# Patient Record
Sex: Female | Born: 1950
Health system: Southern US, Community
[De-identification: ages and names within clinical notes are randomized; demographics above are authoritative.]

## PROBLEM LIST (undated history)

## (undated) DIAGNOSIS — C50919 Malignant neoplasm of unspecified site of unspecified female breast: Secondary | ICD-10-CM

## (undated) DIAGNOSIS — E079 Disorder of thyroid, unspecified: Secondary | ICD-10-CM

## (undated) DIAGNOSIS — K219 Gastro-esophageal reflux disease without esophagitis: Secondary | ICD-10-CM

## (undated) DIAGNOSIS — E785 Hyperlipidemia, unspecified: Secondary | ICD-10-CM

## (undated) DIAGNOSIS — M329 Systemic lupus erythematosus, unspecified: Secondary | ICD-10-CM

## (undated) DIAGNOSIS — E119 Type 2 diabetes mellitus without complications: Secondary | ICD-10-CM

## (undated) DIAGNOSIS — Z9889 Other specified postprocedural states: Secondary | ICD-10-CM

## (undated) DIAGNOSIS — E039 Hypothyroidism, unspecified: Secondary | ICD-10-CM

## (undated) DIAGNOSIS — R011 Cardiac murmur, unspecified: Secondary | ICD-10-CM

## (undated) DIAGNOSIS — I10 Essential (primary) hypertension: Secondary | ICD-10-CM

## (undated) DIAGNOSIS — R112 Nausea with vomiting, unspecified: Secondary | ICD-10-CM

## (undated) DIAGNOSIS — Z95828 Presence of other vascular implants and grafts: Secondary | ICD-10-CM

## (undated) DIAGNOSIS — IMO0002 Reserved for concepts with insufficient information to code with codable children: Secondary | ICD-10-CM

## (undated) HISTORY — DX: Hyperlipidemia, unspecified: E78.5

## (undated) HISTORY — PX: ABDOMINAL HYSTERECTOMY: SHX81

## (undated) HISTORY — PX: TONSILLECTOMY: SUR1361

## (undated) HISTORY — DX: Type 2 diabetes mellitus without complications: E11.9

## (undated) HISTORY — DX: Essential (primary) hypertension: I10

## (undated) HISTORY — DX: Reserved for concepts with insufficient information to code with codable children: IMO0002

## (undated) HISTORY — DX: Presence of other vascular implants and grafts: Z95.828

## (undated) HISTORY — DX: Disorder of thyroid, unspecified: E07.9

## (undated) HISTORY — DX: Systemic lupus erythematosus, unspecified: M32.9

## (undated) HISTORY — PX: OOPHORECTOMY: SHX86

---

## 2000-12-19 HISTORY — PX: BREAST BIOPSY: SHX20

## 2004-12-06 ENCOUNTER — Ambulatory Visit: Payer: Self-pay

## 2004-12-19 HISTORY — PX: GUM SURGERY: SHX658

## 2005-06-15 ENCOUNTER — Ambulatory Visit: Payer: Self-pay

## 2006-08-03 ENCOUNTER — Ambulatory Visit: Payer: Self-pay | Admitting: Family Medicine

## 2007-08-15 ENCOUNTER — Ambulatory Visit: Payer: Self-pay

## 2007-09-05 LAB — HM DEXA SCAN

## 2008-09-18 ENCOUNTER — Ambulatory Visit: Payer: Self-pay | Admitting: Family Medicine

## 2009-09-21 ENCOUNTER — Ambulatory Visit: Payer: Self-pay | Admitting: Family Medicine

## 2009-09-24 ENCOUNTER — Ambulatory Visit: Payer: Self-pay

## 2010-10-27 ENCOUNTER — Ambulatory Visit: Payer: Self-pay

## 2011-12-22 LAB — HM PAP SMEAR: HM PAP: NEGATIVE

## 2011-12-28 ENCOUNTER — Ambulatory Visit: Payer: Self-pay | Admitting: Family Medicine

## 2013-03-05 ENCOUNTER — Ambulatory Visit: Payer: Self-pay | Admitting: Family Medicine

## 2013-11-11 ENCOUNTER — Ambulatory Visit: Payer: Self-pay | Admitting: Family Medicine

## 2014-04-08 ENCOUNTER — Ambulatory Visit: Payer: Self-pay | Admitting: Family Medicine

## 2014-04-08 LAB — HM MAMMOGRAPHY

## 2014-12-19 DIAGNOSIS — R011 Cardiac murmur, unspecified: Secondary | ICD-10-CM

## 2014-12-19 HISTORY — DX: Cardiac murmur, unspecified: R01.1

## 2014-12-19 HISTORY — PX: MASTECTOMY: SHX3

## 2015-04-28 LAB — HEMOGLOBIN A1C: Hgb A1c MFr Bld: 6.2 % — AB (ref 4.0–6.0)

## 2015-04-28 LAB — BASIC METABOLIC PANEL
BUN: 11 mg/dL (ref 4–21)
Creatinine: 0.6 mg/dL (ref 0.5–1.1)
Glucose: 92 mg/dL
Potassium: 3.9 mmol/L (ref 3.4–5.3)
Sodium: 136 mmol/L — AB (ref 137–147)

## 2015-04-28 LAB — TSH: TSH: 1.28 u[IU]/mL (ref 0.41–5.90)

## 2015-04-28 LAB — HEPATIC FUNCTION PANEL
ALT: 33 U/L (ref 7–35)
AST: 27 U/L (ref 13–35)

## 2015-04-28 LAB — CBC AND DIFFERENTIAL
HEMATOCRIT: 40 % (ref 36–46)
HEMOGLOBIN: 14.7 g/dL (ref 12.0–16.0)
PLATELETS: 260 10*3/uL (ref 150–399)
WBC: 5.9 10^3/mL

## 2015-04-28 LAB — LIPID PANEL
Cholesterol: 161 mg/dL (ref 0–200)
HDL: 37 mg/dL (ref 35–70)
LDL CALC: 72 mg/dL
TRIGLYCERIDES: 258 mg/dL — AB (ref 40–160)

## 2015-05-06 ENCOUNTER — Other Ambulatory Visit: Payer: Self-pay | Admitting: Family Medicine

## 2015-05-06 DIAGNOSIS — Z1239 Encounter for other screening for malignant neoplasm of breast: Secondary | ICD-10-CM

## 2015-05-20 ENCOUNTER — Ambulatory Visit
Admission: RE | Admit: 2015-05-20 | Discharge: 2015-05-20 | Disposition: A | Discharge status: Home / Self Care | Payer: BLUE CROSS/BLUE SHIELD | Source: Ambulatory Visit | Attending: Family Medicine | Admitting: Family Medicine

## 2015-05-20 DIAGNOSIS — Z1231 Encounter for screening mammogram for malignant neoplasm of breast: Secondary | ICD-10-CM | POA: Diagnosis present

## 2015-05-20 DIAGNOSIS — R928 Other abnormal and inconclusive findings on diagnostic imaging of breast: Secondary | ICD-10-CM | POA: Insufficient documentation

## 2015-05-20 DIAGNOSIS — Z1239 Encounter for other screening for malignant neoplasm of breast: Secondary | ICD-10-CM

## 2015-05-25 ENCOUNTER — Other Ambulatory Visit: Payer: Self-pay | Admitting: Family Medicine

## 2015-05-25 DIAGNOSIS — R921 Mammographic calcification found on diagnostic imaging of breast: Secondary | ICD-10-CM

## 2015-05-25 DIAGNOSIS — R928 Other abnormal and inconclusive findings on diagnostic imaging of breast: Secondary | ICD-10-CM

## 2015-05-25 DIAGNOSIS — N6489 Other specified disorders of breast: Secondary | ICD-10-CM

## 2015-06-01 ENCOUNTER — Ambulatory Visit
Admission: RE | Admit: 2015-06-01 | Discharge: 2015-06-01 | Disposition: A | Discharge status: Home / Self Care | Payer: BLUE CROSS/BLUE SHIELD | Source: Ambulatory Visit | Attending: Family Medicine | Admitting: Family Medicine

## 2015-06-01 DIAGNOSIS — N63 Unspecified lump in breast: Secondary | ICD-10-CM | POA: Diagnosis not present

## 2015-06-01 DIAGNOSIS — N6489 Other specified disorders of breast: Secondary | ICD-10-CM

## 2015-06-01 DIAGNOSIS — R928 Other abnormal and inconclusive findings on diagnostic imaging of breast: Secondary | ICD-10-CM

## 2015-06-01 DIAGNOSIS — R921 Mammographic calcification found on diagnostic imaging of breast: Secondary | ICD-10-CM

## 2015-06-02 ENCOUNTER — Telehealth: Payer: Self-pay

## 2015-06-02 ENCOUNTER — Other Ambulatory Visit: Payer: Self-pay | Admitting: Family Medicine

## 2015-06-02 DIAGNOSIS — N63 Unspecified lump in unspecified breast: Secondary | ICD-10-CM

## 2015-06-02 DIAGNOSIS — R928 Other abnormal and inconclusive findings on diagnostic imaging of breast: Secondary | ICD-10-CM

## 2015-06-02 NOTE — Telephone Encounter (Signed)
Talked with patient. Wishes to proceed with biopsy thru radiology.  Spoke with Estill Bamberg and she will schedule.

## 2015-06-02 NOTE — Telephone Encounter (Signed)
Heather Burke with [facility] wants to know if you want them to set up her biopsy or are you sending her out to surgeon for biopsy?  Heather Burke call back #  Is [phone]

## 2015-06-08 ENCOUNTER — Ambulatory Visit
Admission: RE | Admit: 2015-06-08 | Discharge: 2015-06-08 | Disposition: A | Discharge status: Home / Self Care | Payer: BLUE CROSS/BLUE SHIELD | Source: Ambulatory Visit | Attending: Family Medicine | Admitting: Family Medicine

## 2015-06-08 ENCOUNTER — Other Ambulatory Visit: Payer: Self-pay | Admitting: Family Medicine

## 2015-06-08 DIAGNOSIS — R928 Other abnormal and inconclusive findings on diagnostic imaging of breast: Secondary | ICD-10-CM

## 2015-06-08 DIAGNOSIS — N63 Unspecified lump in unspecified breast: Secondary | ICD-10-CM

## 2015-06-08 DIAGNOSIS — C50919 Malignant neoplasm of unspecified site of unspecified female breast: Secondary | ICD-10-CM

## 2015-06-08 DIAGNOSIS — R921 Mammographic calcification found on diagnostic imaging of breast: Secondary | ICD-10-CM | POA: Diagnosis present

## 2015-06-08 DIAGNOSIS — N6489 Other specified disorders of breast: Secondary | ICD-10-CM | POA: Insufficient documentation

## 2015-06-08 HISTORY — DX: Malignant neoplasm of unspecified site of unspecified female breast: C50.919

## 2015-06-08 HISTORY — PX: BREAST BIOPSY: SHX20

## 2015-06-08 HISTORY — PX: OTHER SURGICAL HISTORY: SHX169

## 2015-06-09 ENCOUNTER — Telehealth: Payer: Self-pay

## 2015-06-09 DIAGNOSIS — C50912 Malignant neoplasm of unspecified site of left female breast: Secondary | ICD-10-CM

## 2015-06-09 NOTE — Telephone Encounter (Signed)
Dr. Luana Shu from [facility] called with pathology report for Heather Burke.  Part A left breast at 2 o'clock biopsy- invasive mammary carcinoma, DCIS Part B left breast at 11:30 -invasive lobular carcinoma, LCIS ER, PR and HER #2 ARE PENDING.

## 2015-06-11 ENCOUNTER — Encounter: Payer: Self-pay | Admitting: Family Medicine

## 2015-06-11 ENCOUNTER — Other Ambulatory Visit: Payer: Self-pay | Admitting: Family Medicine

## 2015-06-11 DIAGNOSIS — C50919 Malignant neoplasm of unspecified site of unspecified female breast: Secondary | ICD-10-CM | POA: Insufficient documentation

## 2015-06-11 NOTE — Telephone Encounter (Signed)
Patient advised of breast cancer. Please refer to Dr. Tollie Pizza who she saw in 2002. Please do this ASAP as she is very anxious about this. Thank you

## 2015-06-12 NOTE — Telephone Encounter (Signed)
Please refer Dr. Bary Castilla for recurrent of breast cancer.Thanks.

## 2015-06-15 ENCOUNTER — Other Ambulatory Visit: Payer: Self-pay | Admitting: Family Medicine

## 2015-06-15 ENCOUNTER — Encounter: Payer: Self-pay | Admitting: *Deleted

## 2015-06-15 NOTE — Progress Notes (Signed)
Patient newly diagnosed with breast cancer.  Called to establish navigation services.  States she has appointment with Dr. Bary Castilla tomorrow.  Instructed I would leave patient breast cancer educational literature, "My Breast Cancer Treatment Handbook" by Josephine Igo, RN, at Dr. Dwyane Luo office.  She is to call if she has any questions or needs.

## 2015-06-16 ENCOUNTER — Ambulatory Visit (INDEPENDENT_AMBULATORY_CARE_PROVIDER_SITE_OTHER): Payer: BLUE CROSS/BLUE SHIELD | Admitting: General Surgery

## 2015-06-16 ENCOUNTER — Encounter: Payer: Self-pay | Admitting: General Surgery

## 2015-06-16 VITALS — BP 132/82 | HR 78 | Resp 18 | Ht 63.0 in | Wt 172.0 lb

## 2015-06-16 DIAGNOSIS — C50912 Malignant neoplasm of unspecified site of left female breast: Secondary | ICD-10-CM | POA: Diagnosis not present

## 2015-06-16 NOTE — Progress Notes (Signed)
Patient ID: Heather Burke, female   DOB: July 08, 1951, 64 y.o.   MRN: [id number]  Chief Complaint  Patient presents with  . Other    breast cancer    HPI Heather Burke is a 64 y.o. female who presents for a breast evaluation. The most recent mammogram and ultrasound and left core biopsy was done on 06/08/15 showing invasive breast cancer. She states that they found 3 spots and biopsied 2 of them.  Patient does perform regular self breast checks and gets regular mammograms done.  She states she had not felt anything different in the breast.  She is here today with her 2 daughters, Heather Burke and Heather Burke as well as her sister, Heather Burke.  Denies family history of breast or colon cancer.  She has not had a colonoscopy. This was on her "to do list" before her recent breast cancer diagnosis.  HPI  Past Medical History  Diagnosis Date  . Hypertension   . Hyperlipidemia   . Thyroid disease   . Cancer 06-08-15    invasive breast    Past Surgical History  Procedure Laterality Date  . Breast biopsy Right 2002    negative/ Dr. Bary Castilla  . Core biopsy Left 06/08/15  . Abdominal hysterectomy    . Tonsillectomy      Family History  Problem Relation Age of Onset  . Cancer Father     prostate    Social History History  Substance Use Topics  . Smoking status: Current Every Day Smoker -- 2.00 packs/day for 44 years    Types: Cigarettes  . Smokeless tobacco: Never Used  . Alcohol Use: 0.0 oz/week    0 Standard drinks or equivalent per week     Comment: 3-4/day    No Known Allergies  Current Outpatient Prescriptions  Medication Sig Dispense Refill  . aspirin 81 MG tablet Take 81 mg by mouth daily.    . calcium carbonate (OS-CAL) 600 MG TABS tablet Take 600 mg by mouth 2 (two) times daily with a meal.    . Cholecalciferol (D3 ADULT PO) Take by mouth.    . FIBER PO Take by mouth.    . fluticasone (FLONASE) 50 MCG/ACT nasal spray   5  . losartan-hydrochlorothiazide (HYZAAR) 100-25 MG  per tablet TAKE 1 TABLET BY MOUTH EVERY DAY 30 tablet 4  . lovastatin (MEVACOR) 20 MG tablet Take 20 mg by mouth at bedtime.    . Omega-3 Fatty Acids (FISH OIL) 1200 MG CAPS Take by mouth daily.    Marland Kitchen SYNTHROID 88 MCG tablet TAKE 1 TABLET EVERY DAY --- (BRAND NAME ONLY)  6   No current facility-administered medications for this visit.    Review of Systems Review of Systems  Constitutional: Negative.   Respiratory: Negative.   Cardiovascular: Negative.   All other systems reviewed and are negative.   Blood pressure 132/82, pulse 78, resp. rate 18, height $RemoveBe'5\' 3"'jUjccOAlF$  (1.6 m), weight 172 lb (78.019 kg).  Physical Exam Physical Exam  Constitutional: She is oriented to person, place, and time. She appears well-developed and well-nourished.  HENT:  Mouth/Throat: Oropharynx is clear and moist.  Eyes: Conjunctivae are normal. No scleral icterus.  Neck: Neck supple.  Cardiovascular: Normal rate, regular rhythm and normal heart sounds.   Pulmonary/Chest: Effort normal and breath sounds normal. Right breast exhibits mass. Right breast exhibits no inverted nipple, no nipple discharge, no skin change and no tenderness. Left breast exhibits skin change. Left breast exhibits no inverted nipple, no mass, no  nipple discharge and no tenderness.    Left breast bruising upper inner quadrant and 2 o'clock position. Thickening at 10 o'clock right breast and 1 cm nodule at site of previous biopsy at 10 oclockl 9 CFN.  Lymphadenopathy:    She has no cervical adenopathy.    She has no axillary adenopathy.  Neurological: She is alert and oriented to person, place, and time.  Skin: Skin is warm and dry.  Psychiatric: She has a normal mood and affect.    Data Reviewed [facility] mammograms dated 05/20/2015 showed a possible asymmetry and calcification 14 further studies. The right breast was unremarkable. BI-RADS-0.  Left breast diagnostic mammogram and ultrasound dated 06/01/2015 showed a spiculated mass with  calcifications in the upper inner quadrant of the left breast, 2 smaller spiculated masses in the upper outer quadrant left breast at the 1 2:00 positions. Ultrasound showed a 9 mm area in the 11:30 o'clock position of the left breast 5 cm the nipple suspicious for cancer. A similar focus in the 1:00 position also 9 mm in sign is also noted. Irregular area in the 2:00 position measures 8 x 8 x 7 mm and is also suspicious. The distance from the 11:30 o'clock to the 2:00 lesion is reported 7.6 cm. An area of microcalcifications in the retroareolar area of the breast had been highlighted on her previous mammograms was not further, and upon. By red-5.  Patient subsequently underwent ultrasound-guided core biopsy of 2 lesions at 11:30 and 12:00 position. The 11:00 lesion showed invasive lobular carcinoma of a classic type as well as LCIS. The 2:00 lesion showed invasive mammary carcinoma with low-grade DCIS.  ER and PR receptors were positive for both lesions. HER-2/neu was reported as equivocal for both. No report of reflex testing to fish.  Postbiopsy imaging dated 620, 2018 was reviewed.  Assessment    At a minimum 2 foci of malignancy involving the left breast. Volume may make breast conservation difficult. The microcalcifications in the retroareolar area have yet to be addressed and should breast conservation be considered stereotactic biopsy of this area would be mandatory.    Plan    The majority of our hour-long visit was present reviewing options for management. The first order of business is or the patient to determine how important it is to her to preserve her own breast. Breast conservation and mastectomy were presented as equivalent in the case of a unifocal malignancy. With at least 2 foci and possibly another encompassing the breast, we'll need to have a really strong commitment to breast conservation rather than mastectomy with or without immediate reconstruction.  The option for plastic  surgery evaluation prior to surgical therapy was encouraged.  The risks associated with surgery, recovery time and return to activities was discussed.  With the findings of invasive lobular carcinoma strong consideration will be given to preoperative MRI as this contorted asleep be overlooked on mammography in the right breast.  The patient and her daughters had all of their questions answered. She'll consider her options and notify the office of how she would like to proceed. He was emphasized to her that this is the time to get the information that she needs to make a good decision.     PCP: Etheleen Mayhew 06/17/2015, 7:11 PM

## 2015-06-16 NOTE — Patient Instructions (Signed)
The patient is aware to call back for any questions or concerns.  

## 2015-06-17 DIAGNOSIS — Z853 Personal history of malignant neoplasm of breast: Secondary | ICD-10-CM | POA: Insufficient documentation

## 2015-06-17 DIAGNOSIS — C50919 Malignant neoplasm of unspecified site of unspecified female breast: Secondary | ICD-10-CM | POA: Insufficient documentation

## 2015-06-18 ENCOUNTER — Telehealth: Payer: Self-pay | Admitting: *Deleted

## 2015-06-18 NOTE — Telephone Encounter (Signed)
Talked to patient today.  States she met with Dr. Bary Castilla and is now deciding on which type of surgery she wants.  States she did get her [employer] manager.  Offered support.  Ok to follow-up with her in about a week.

## 2015-06-23 ENCOUNTER — Telehealth: Payer: Self-pay

## 2015-06-23 NOTE — Telephone Encounter (Signed)
The patient called about surgery. She states that she would like to precede with having the breast lumpectomy done.

## 2015-06-23 NOTE — Telephone Encounter (Signed)
He patient has expressed an interest in breast conservation. With the finding of invasive lobular carcinoma, I have encouraged her to have a breast MRI completed prior to surgical intervention. This may also help clear the area of microcalcifications in the retroareolar area, but biopsy may still be necessary after reviewing the films with radiology service.  The patient reports she does have some tendency to claustrophobia, and for that reason we'll arrange for her to pick up a prescription for Valium, 10 mg to be taken one hour prior to the procedure. She is aware that she will need to have a driver to the [place] office for this imaging study to be completed.  We'll start looking for a date for surgical intervention after we know when the MRI is scheduled.

## 2015-06-24 ENCOUNTER — Other Ambulatory Visit: Payer: Self-pay

## 2015-06-24 DIAGNOSIS — C50912 Malignant neoplasm of unspecified site of left female breast: Secondary | ICD-10-CM

## 2015-06-24 NOTE — Progress Notes (Signed)
Spoke with patient about scheduling her breast MRI. She is aware that the [facility] will be calling her in the next 2 days to schedule this. I have asked her to call us once this is scheduled and we will have her pick up her prescription for Valium here at the office.

## 2015-06-26 NOTE — Telephone Encounter (Signed)
Patient called and her MRI is scheduled for 07/03/15 @ 9:30  [facility], Patient to pick up prescription on Monday 06/29/15.

## 2015-06-29 LAB — SURGICAL PATHOLOGY

## 2015-07-01 ENCOUNTER — Other Ambulatory Visit: Payer: Self-pay | Admitting: Family Medicine

## 2015-07-01 DIAGNOSIS — E1159 Type 2 diabetes mellitus with other circulatory complications: Secondary | ICD-10-CM | POA: Insufficient documentation

## 2015-07-01 DIAGNOSIS — I152 Hypertension secondary to endocrine disorders: Secondary | ICD-10-CM | POA: Insufficient documentation

## 2015-07-01 DIAGNOSIS — I10 Essential (primary) hypertension: Secondary | ICD-10-CM

## 2015-07-01 NOTE — Telephone Encounter (Signed)
This came to Dr. Rosanna Randy for Cozaar refill to [facility].  This is a Dr. Venia Minks patient. Thanks  [employer]

## 2015-07-03 ENCOUNTER — Ambulatory Visit
Admission: RE | Admit: 2015-07-03 | Discharge: 2015-07-03 | Disposition: A | Discharge status: Home / Self Care | Payer: BLUE CROSS/BLUE SHIELD | Source: Ambulatory Visit | Attending: General Surgery | Admitting: General Surgery

## 2015-07-03 DIAGNOSIS — C50912 Malignant neoplasm of unspecified site of left female breast: Secondary | ICD-10-CM

## 2015-07-03 MED ORDER — GADOBENATE DIMEGLUMINE 529 MG/ML IV SOLN
16.0000 mL | Freq: Once | INTRAVENOUS | Status: AC | PRN
  Administered 2015-07-03: 16 mL via INTRAVENOUS

## 2015-07-04 ENCOUNTER — Other Ambulatory Visit: Payer: Self-pay | Admitting: Family Medicine

## 2015-07-07 ENCOUNTER — Telehealth: Payer: Self-pay | Admitting: General Surgery

## 2015-07-07 ENCOUNTER — Other Ambulatory Visit: Payer: Self-pay

## 2015-07-07 DIAGNOSIS — C50912 Malignant neoplasm of unspecified site of left female breast: Secondary | ICD-10-CM

## 2015-07-07 NOTE — Telephone Encounter (Signed)
Results of recently completed bilateral breast MRI from 07/03/2015 was reviewed with the patient. 3 known areas of suspected cancer as well as an additional satellite lesion adjacent. Area of microcalcification shows no increased enhancement, although it recent [facility] tumor board presentation biopsy of this area was recommended if breast conservation was planned. The contralateral breast is normal.  The patient is leaning towards mastectomy, which I would agree with in light of 3 separate primaries and the wide area involved. She would like to meet with plastic surgery, an appointment will be scheduled as soon as possible. She would be a candidate for skin sparing mastectomy.  We'll arrange for an office visit after her upcoming plastic surgery assessment.

## 2015-07-08 ENCOUNTER — Telehealth: Payer: Self-pay | Admitting: *Deleted

## 2015-07-08 NOTE — Telephone Encounter (Signed)
[place] at Dr. Edwin Dada office has been notified accordingly. Their office will be in contact with the patient to schedule an appointment.

## 2015-07-08 NOTE — Telephone Encounter (Signed)
Patient called and stated that she cannot see the plastic surgeon the week of August 20 th.

## 2015-07-17 ENCOUNTER — Telehealth: Payer: Self-pay

## 2015-07-17 NOTE — Telephone Encounter (Signed)
I spoke with the patient about her decision to proceed to mastectomy. At this time, after conversation with the office staff at Dr. Edwin Dada, she is decided to put plastic surgery consultation on the back burner.  With 3 foci of malignancy in the breast I think mastectomy is a good choice.  We will shoot for the end of next month as she would like with a preop visit to confirm no new questions have arisen.

## 2015-07-17 NOTE — Telephone Encounter (Signed)
Patient called and would like to scheduled her surgery. She said that she would like to go ahead with the Mastectomy. She will be following up with her plastic surgeon Dr Tula Nakayama once she is fully healed to discuss reconstruction. She would like to have her surgery done on 08/18/15. She is aware she will need to be seen here in the office for a pre op visit with Dr Bary Castilla. I let her know I would call her back with full instructions once her orders had been received.

## 2015-07-20 ENCOUNTER — Encounter: Payer: Self-pay | Admitting: General Surgery

## 2015-07-20 ENCOUNTER — Other Ambulatory Visit: Payer: Self-pay | Admitting: *Deleted

## 2015-07-20 DIAGNOSIS — C50912 Malignant neoplasm of unspecified site of left female breast: Secondary | ICD-10-CM

## 2015-08-06 ENCOUNTER — Encounter: Payer: Self-pay | Admitting: General Surgery

## 2015-08-06 ENCOUNTER — Ambulatory Visit (INDEPENDENT_AMBULATORY_CARE_PROVIDER_SITE_OTHER): Payer: BLUE CROSS/BLUE SHIELD | Admitting: General Surgery

## 2015-08-06 VITALS — BP 144/82 | HR 80 | Resp 12 | Ht 63.0 in | Wt 172.0 lb

## 2015-08-06 DIAGNOSIS — C50912 Malignant neoplasm of unspecified site of left female breast: Secondary | ICD-10-CM

## 2015-08-06 NOTE — Patient Instructions (Addendum)
The patient is aware to call back for any questions or concerns.  Patient's surgery has been scheduled for 08-18-15 at [facility]. It is okay for patient to continue 81 mg aspirin once daily.

## 2015-08-06 NOTE — Progress Notes (Signed)
Patient ID: Heather Burke, female   DOB: Jul 24, 1951, 64 y.o.   MRN: [id number]  Chief Complaint  Patient presents with  . Pre-op Exam    HPI Heather Burke is a 64 y.o. female.  Here today for her preop visit, left mastectomy on 08-18-15. She did have a breast MRI on 07-03-15.  HPI  Past Medical History  Diagnosis Date  . Hypertension   . Hyperlipidemia   . Thyroid disease   . Cancer 06-08-15    invasive breast, ER+,PR+, Her 2 neg (FISH neg) x2.    Past Surgical History  Procedure Laterality Date  . Breast biopsy Right 2002    negative/ Dr. Bary Castilla  . Core biopsy Left 06/08/15  . Abdominal hysterectomy    . Tonsillectomy    . Breast biopsy Left 06-08-15    INVASIVE MAMMARY CARCINOMA WITH PARTIAL SOLID PATTERN.     Family History  Problem Relation Age of Onset  . Cancer Father     prostate    Social History Social History  Substance Use Topics  . Smoking status: Current Every Day Smoker -- 2.00 packs/day for 44 years    Types: Cigarettes  . Smokeless tobacco: Never Used  . Alcohol Use: 0.0 oz/week    0 Standard drinks or equivalent per week     Comment: 3-4/day    No Known Allergies  Current Outpatient Prescriptions  Medication Sig Dispense Refill  . aspirin 81 MG tablet Take 81 mg by mouth daily.    . calcium carbonate (OS-CAL) 600 MG TABS tablet Take 600 mg by mouth 2 (two) times daily with a meal.    . Cholecalciferol (D3 ADULT PO) Take by mouth.    . FIBER PO Take by mouth.    . fluticasone (FLONASE) 50 MCG/ACT nasal spray   5  . losartan-hydrochlorothiazide (HYZAAR) 100-25 MG per tablet TAKE 1 TABLET BY MOUTH EVERY DAY 30 tablet 3  . lovastatin (MEVACOR) 20 MG tablet Take 20 mg by mouth at bedtime.    . Omega-3 Fatty Acids (FISH OIL) 1200 MG CAPS Take by mouth daily.    Marland Kitchen SYNTHROID 88 MCG tablet TAKE 1 TABLET EVERY DAY --- (BRAND NAME ONLY)  6   No current facility-administered medications for this visit.    Review of Systems Review of Systems   Constitutional: Negative.   Respiratory: Negative.   Cardiovascular: Negative.   Neurological: Positive for headaches.    Blood pressure 144/82, pulse 80, resp. rate 12, height 5\' 3"  (1.6 m), weight 172 lb (78.019 kg).  Physical Exam Physical Exam  Constitutional: She is oriented to person, place, and time. She appears well-developed and well-nourished.  HENT:  Mouth/Throat: Oropharynx is clear and moist.  Eyes: Conjunctivae are normal. No scleral icterus.  Neck: Neck supple.  Cardiovascular: Normal rate and regular rhythm.   Murmur heard.  Systolic murmur is present with a grade of 2/6  Pulmonary/Chest: Effort normal and breath sounds normal.  Left breast bruising.  Lymphadenopathy:    She has no cervical adenopathy.  Neurological: She is alert and oriented to person, place, and time.  Skin: Skin is warm.  Psychiatric: Her behavior is normal.    Data Reviewed IMPRESSION: 1. Biopsy-proven multicentric malignancy involving the left breast. The 3 adjacent masses identified on ultrasound are confirmed on MRI at the 11:30 o'clock position, 1 o'clock position and 2 o'clock position. 2. Possible 3 mm satellite nodule adjacent to the 2 o'clock mass, approximately 6 mm posterior to the mass.  Due to its close proximity to the dominant 2 o'clock mass, it would likely be removed at the time of resection. 3. No MRI evidence of malignancy involving the right breast. 4. Benign sebaceous cyst involving the outer right breast at the approximate 9 o'clock position. 5. No pathologic lymphadenopathy. 6. 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 On review of the films, this involves the right chest wall.   Assessment    Multifocal carcinoma the left breast.    Plan    The patient has decided to proceed with mastectomy. At this time, immediate reconstruction is not on the table. Patient's surgery has  been scheduled for 08-18-15 at [facility]. Postoperative care including drain management and compressive wrap reviewed. It is acceptable for patient to continue 81 mg aspirin once daily.      PCP:  Etheleen Mayhew 08/08/2015, 7:51 AM

## 2015-08-08 NOTE — H&P (Signed)
Patient ID: Heather Burke, female DOB: [birth year], 64 y.o. MRN: [id number]  Chief Complaint   Patient presents with   .  Pre-op Exam    HPI  Heather Burke is a 64 y.o. female. Here today for her preop visit, left mastectomy on 08-18-15. She did have a breast MRI on 07-03-15.  HPI  Past Medical History   Diagnosis  Date   .  Hypertension    .  Hyperlipidemia    .  Thyroid disease    .  Cancer  06-08-15     invasive breast, ER+,PR+, Her 2 neg (FISH neg) x2.    Past Surgical History   Procedure  Laterality  Date   .  Breast biopsy  Right  2002     negative/ Dr. Bary Castilla   .  Core biopsy  Left  06/08/15   .  Abdominal hysterectomy     .  Tonsillectomy     .  Breast biopsy  Left  06-08-15     INVASIVE MAMMARY CARCINOMA WITH PARTIAL SOLID PATTERN.    Family History   Problem  Relation  Age of Onset   .  Cancer  Father      prostate    Social History  Social History   Substance Use Topics   .  Smoking status:  Current Every Day Smoker -- 2.00 packs/day for 44 years     Types:  Cigarettes   .  Smokeless tobacco:  Never Used   .  Alcohol Use:  0.0 oz/week     0 Standard drinks or equivalent per week      Comment: 3-4/day    No Known Allergies  Current Outpatient Prescriptions   Medication  Sig  Dispense  Refill   .  aspirin 81 MG tablet  Take 81 mg by mouth daily.     .  calcium carbonate (OS-CAL) 600 MG TABS tablet  Take 600 mg by mouth 2 (two) times daily with a meal.     .  Cholecalciferol (D3 ADULT PO)  Take by mouth.     .  FIBER PO  Take by mouth.     .  fluticasone (FLONASE) 50 MCG/ACT nasal spray    5   .  losartan-hydrochlorothiazide (HYZAAR) 100-25 MG per tablet  TAKE 1 TABLET BY MOUTH EVERY DAY  30 tablet  3   .  lovastatin (MEVACOR) 20 MG tablet  Take 20 mg by mouth at bedtime.     .  Omega-3 Fatty Acids (FISH OIL) 1200 MG CAPS  Take by mouth daily.     Marland Kitchen  SYNTHROID 88 MCG tablet  TAKE 1 TABLET EVERY DAY --- (BRAND NAME ONLY)   6    No current  facility-administered medications for this visit.    Review of Systems  Review of Systems  Constitutional: Negative.  Respiratory: Negative.  Cardiovascular: Negative.  Neurological: Positive for headaches.   Blood pressure 144/82, pulse 80, resp. rate 12, height 5\' 3"  (1.6 m), weight 172 lb (78.019 kg).  Physical Exam  Physical Exam  Constitutional: She is oriented to person, place, and time. She appears well-developed and well-nourished.  HENT:  Mouth/Throat: Oropharynx is clear and moist.  Eyes: Conjunctivae are normal. No scleral icterus.  Neck: Neck supple.  Cardiovascular: Normal rate and regular rhythm.  Murmur heard.  Systolic murmur is present with a grade of 2/6  Pulmonary/Chest: Effort normal and breath sounds normal.  Left breast bruising.  Lymphadenopathy:  She  has no cervical adenopathy.  Neurological: She is alert and oriented to person, place, and time.  Skin: Skin is warm.  Psychiatric: Her behavior is normal.   Data Reviewed  IMPRESSION:  1. Biopsy-proven multicentric malignancy involving the left breast.  The 3 adjacent masses identified on ultrasound are confirmed on MRI  at the 11:30 o'clock position, 1 o'clock position and 2 o'clock  position.  2. Possible 3 mm satellite nodule adjacent to the 2 o'clock mass,  approximately 6 mm posterior to the mass. Due to its close proximity  to the dominant 2 o'clock mass, it would likely be removed at the  time of resection.  3. No MRI evidence of malignancy involving the right breast.  4. Benign sebaceous cyst involving the outer right breast at the  approximate 9 o'clock position.  5. No pathologic lymphadenopathy.  6. 8 mm aneurysm involving the proximal internal thoracic artery as  it courses between the pectoralis muscles in the chest wall. On review of the films, this involves the right chest wall.  Assessment   Multifocal carcinoma the left breast.   Plan   The patient has decided to proceed with  mastectomy. At this time, immediate reconstruction is not on the table. Patient's surgery has been scheduled for 08-18-15 at [facility]. Postoperative care including drain management and compressive wrap reviewed. It is acceptable for patient to continue 81 mg aspirin once daily.   PCP: Etheleen Mayhew  08/08/2015, 7:51 AM

## 2015-08-13 ENCOUNTER — Other Ambulatory Visit: Payer: BLUE CROSS/BLUE SHIELD

## 2015-08-13 ENCOUNTER — Encounter: Payer: Self-pay | Admitting: *Deleted

## 2015-08-13 DIAGNOSIS — C50912 Malignant neoplasm of unspecified site of left female breast: Secondary | ICD-10-CM | POA: Diagnosis present

## 2015-08-13 DIAGNOSIS — C50212 Malignant neoplasm of upper-inner quadrant of left female breast: Secondary | ICD-10-CM | POA: Diagnosis not present

## 2015-08-13 DIAGNOSIS — Z8042 Family history of malignant neoplasm of prostate: Secondary | ICD-10-CM | POA: Diagnosis not present

## 2015-08-13 DIAGNOSIS — Z170 Estrogen receptor positive status [ER+]: Secondary | ICD-10-CM | POA: Diagnosis not present

## 2015-08-13 DIAGNOSIS — D0512 Intraductal carcinoma in situ of left breast: Secondary | ICD-10-CM | POA: Diagnosis not present

## 2015-08-13 DIAGNOSIS — Z87891 Personal history of nicotine dependence: Secondary | ICD-10-CM | POA: Diagnosis not present

## 2015-08-13 DIAGNOSIS — I10 Essential (primary) hypertension: Secondary | ICD-10-CM | POA: Diagnosis not present

## 2015-08-13 DIAGNOSIS — E785 Hyperlipidemia, unspecified: Secondary | ICD-10-CM | POA: Diagnosis not present

## 2015-08-13 DIAGNOSIS — E039 Hypothyroidism, unspecified: Secondary | ICD-10-CM | POA: Diagnosis not present

## 2015-08-13 DIAGNOSIS — Z79899 Other long term (current) drug therapy: Secondary | ICD-10-CM | POA: Diagnosis not present

## 2015-08-13 DIAGNOSIS — Z7982 Long term (current) use of aspirin: Secondary | ICD-10-CM | POA: Diagnosis not present

## 2015-08-13 MED ORDER — BUPIVACAINE HCL (PF) 0.5 % IJ SOLN
INTRAMUSCULAR | Status: AC
  Filled 2015-08-13: qty 30

## 2015-08-13 NOTE — Patient Instructions (Addendum)
  Your procedure is scheduled on: 08-18-15 Report to [facility] (2ND DESK ON RIGHT) @ 7:45 AM   Remember: Instructions that are not followed completely may result in serious medical risk, up to and including death, or upon the discretion of your surgeon and anesthesiologist your surgery may need to be rescheduled.    _X___ 1. Do not eat food or drink liquids after midnight. No gum chewing or hard candies.     _X___ 2. No Alcohol for 24 hours before or after surgery.   ____ 3. Bring all medications with you on the day of surgery if instructed.    _X___ 4. Notify your doctor if there is any change in your medical condition     (cold, fever, infections).     Do not wear jewelry, make-up, hairpins, clips or nail polish.  Do not wear lotions, powders, or perfumes. You may wear deodorant.  Do not shave 48 hours prior to surgery. Men may shave face and neck.  Do not bring valuables to the hospital.    [facility] is not responsible for any belongings or valuables.               Contacts, dentures or bridgework may not be worn into surgery.  Leave your suitcase in the car. After surgery it may be brought to your room.  For patients admitted to the hospital, discharge time is determined by your treatment team.   Patients discharged the day of surgery will not be allowed to drive home.   Please read over the following fact sheets that you were given:     _X___ Take these medicines the morning of surgery with A SIP OF WATER:    1. SYNTHROID  2.   3.   4.  5.  6.  ____ Fleet Enema (as directed)   _X___ Use CHG Soap as directed  ____ Use inhalers on the day of surgery  ____ Stop metformin 2 days prior to surgery    ____ Take 1/2 of usual insulin dose the night before surgery and none on the morning of surgery.   ____ Stop Coumadin/Plavix/aspirin-N/A  ____ Stop Anti-inflammatories-NO NSAIDS OR ASPIRIN PRODUCTS-TYLENOL OK   ____ Stop supplements until after  surgery.    ____ Bring C-Pap to the hospital.

## 2015-08-17 ENCOUNTER — Other Ambulatory Visit: Payer: BLUE CROSS/BLUE SHIELD

## 2015-08-17 ENCOUNTER — Encounter
Admission: RE | Admit: 2015-08-17 | Discharge: 2015-08-17 | Disposition: A | Discharge status: Home / Self Care | Payer: BLUE CROSS/BLUE SHIELD | Source: Ambulatory Visit | Attending: Anesthesiology | Admitting: Anesthesiology

## 2015-08-17 DIAGNOSIS — D0512 Intraductal carcinoma in situ of left breast: Secondary | ICD-10-CM | POA: Diagnosis not present

## 2015-08-17 LAB — BASIC METABOLIC PANEL
ANION GAP: 10 (ref 5–15)
BUN: 11 mg/dL (ref 6–20)
CHLORIDE: 98 mmol/L — AB (ref 101–111)
CO2: 29 mmol/L (ref 22–32)
Calcium: 9.7 mg/dL (ref 8.9–10.3)
Creatinine, Ser: 1.18 mg/dL — ABNORMAL HIGH (ref 0.44–1.00)
GFR calc Af Amer: 56 mL/min — ABNORMAL LOW (ref >60)
GFR calc non Af Amer: 48 mL/min — ABNORMAL LOW (ref >60)
Glucose, Bld: 189 mg/dL — ABNORMAL HIGH (ref 65–99)
POTASSIUM: 3.4 mmol/L — AB (ref 3.5–5.1)
Sodium: 137 mmol/L (ref 135–145)

## 2015-08-18 ENCOUNTER — Encounter
Admission: RE | Disposition: A | Discharge status: Home / Self Care | Payer: Self-pay | Source: Ambulatory Visit | Attending: General Surgery

## 2015-08-18 ENCOUNTER — Ambulatory Visit
Admission: RE | Admit: 2015-08-18 | Discharge: 2015-08-18 | Disposition: A | Discharge status: Home / Self Care | Payer: BLUE CROSS/BLUE SHIELD | Source: Ambulatory Visit | Attending: General Surgery | Admitting: General Surgery

## 2015-08-18 ENCOUNTER — Ambulatory Visit: Payer: BLUE CROSS/BLUE SHIELD | Admitting: Certified Registered Nurse Anesthetist

## 2015-08-18 ENCOUNTER — Encounter: Payer: Self-pay | Admitting: Oncology

## 2015-08-18 ENCOUNTER — Encounter: Payer: Self-pay | Admitting: Anesthesiology

## 2015-08-18 DIAGNOSIS — Z170 Estrogen receptor positive status [ER+]: Secondary | ICD-10-CM | POA: Insufficient documentation

## 2015-08-18 DIAGNOSIS — E785 Hyperlipidemia, unspecified: Secondary | ICD-10-CM | POA: Insufficient documentation

## 2015-08-18 DIAGNOSIS — D0512 Intraductal carcinoma in situ of left breast: Secondary | ICD-10-CM | POA: Insufficient documentation

## 2015-08-18 DIAGNOSIS — Z87891 Personal history of nicotine dependence: Secondary | ICD-10-CM | POA: Insufficient documentation

## 2015-08-18 DIAGNOSIS — C50912 Malignant neoplasm of unspecified site of left female breast: Secondary | ICD-10-CM

## 2015-08-18 DIAGNOSIS — E039 Hypothyroidism, unspecified: Secondary | ICD-10-CM | POA: Insufficient documentation

## 2015-08-18 DIAGNOSIS — Z8042 Family history of malignant neoplasm of prostate: Secondary | ICD-10-CM | POA: Insufficient documentation

## 2015-08-18 DIAGNOSIS — C50812 Malignant neoplasm of overlapping sites of left female breast: Secondary | ICD-10-CM | POA: Diagnosis not present

## 2015-08-18 DIAGNOSIS — I10 Essential (primary) hypertension: Secondary | ICD-10-CM | POA: Insufficient documentation

## 2015-08-18 DIAGNOSIS — C50212 Malignant neoplasm of upper-inner quadrant of left female breast: Secondary | ICD-10-CM | POA: Insufficient documentation

## 2015-08-18 DIAGNOSIS — Z7982 Long term (current) use of aspirin: Secondary | ICD-10-CM | POA: Insufficient documentation

## 2015-08-18 DIAGNOSIS — Z79899 Other long term (current) drug therapy: Secondary | ICD-10-CM | POA: Insufficient documentation

## 2015-08-18 HISTORY — DX: Gastro-esophageal reflux disease without esophagitis: K21.9

## 2015-08-18 HISTORY — PX: SIMPLE MASTECTOMY WITH AXILLARY SENTINEL NODE BIOPSY: SHX6098

## 2015-08-18 HISTORY — DX: Hypothyroidism, unspecified: E03.9

## 2015-08-18 HISTORY — PX: SENTINEL NODE BIOPSY: SHX6608

## 2015-08-18 HISTORY — DX: Other specified postprocedural states: Z98.890

## 2015-08-18 HISTORY — DX: Other specified postprocedural states: R11.2

## 2015-08-18 HISTORY — DX: Cardiac murmur, unspecified: R01.1

## 2015-08-18 SURGERY — SIMPLE MASTECTOMY
Anesthesia: General | Laterality: Left | Wound class: Clean

## 2015-08-18 MED ORDER — ONDANSETRON HCL 4 MG/2ML IJ SOLN: 4.0000 mg | Freq: Once | INTRAMUSCULAR | Status: DC | PRN

## 2015-08-18 MED ORDER — PROPOFOL 10 MG/ML IV BOLUS
INTRAVENOUS | Status: DC | PRN
  Administered 2015-08-18: 20 mg via INTRAVENOUS
  Administered 2015-08-18: 180 mg via INTRAVENOUS

## 2015-08-18 MED ORDER — CEFAZOLIN SODIUM-DEXTROSE 2-3 GM-% IV SOLR
2.0000 g | INTRAVENOUS | Status: AC
  Administered 2015-08-18: 2 g via INTRAVENOUS

## 2015-08-18 MED ORDER — TECHNETIUM TC 99M SULFUR COLLOID
1.0900 | Freq: Once | INTRAVENOUS | Status: DC | PRN
  Administered 2015-08-18: 1.09 via INTRAVENOUS
  Filled 2015-08-18: qty 1.09

## 2015-08-18 MED ORDER — HYDROCODONE-ACETAMINOPHEN 5-325 MG PO TABS
ORAL_TABLET | ORAL | Status: AC
  Filled 2015-08-18: qty 1

## 2015-08-18 MED ORDER — ACETAMINOPHEN 10 MG/ML IV SOLN
INTRAVENOUS | Status: AC
  Filled 2015-08-18: qty 100

## 2015-08-18 MED ORDER — FENTANYL CITRATE (PF) 100 MCG/2ML IJ SOLN
INTRAMUSCULAR | Status: AC
  Administered 2015-08-18: 25 ug via INTRAVENOUS
  Filled 2015-08-18: qty 2

## 2015-08-18 MED ORDER — CEFAZOLIN SODIUM-DEXTROSE 2-3 GM-% IV SOLR
INTRAVENOUS | Status: AC
  Administered 2015-08-18: 2 g via INTRAVENOUS
  Filled 2015-08-18: qty 50

## 2015-08-18 MED ORDER — FENTANYL CITRATE (PF) 100 MCG/2ML IJ SOLN
25.0000 ug | INTRAMUSCULAR | Status: DC | PRN
  Administered 2015-08-18 (×4): 25 ug via INTRAVENOUS

## 2015-08-18 MED ORDER — LIDOCAINE HCL (CARDIAC) 20 MG/ML IV SOLN
INTRAVENOUS | Status: DC | PRN
  Administered 2015-08-18: 60 mg via INTRAVENOUS

## 2015-08-18 MED ORDER — DEXAMETHASONE SODIUM PHOSPHATE 4 MG/ML IJ SOLN
INTRAMUSCULAR | Status: DC | PRN
  Administered 2015-08-18: 8 mg via INTRAVENOUS

## 2015-08-18 MED ORDER — MIDAZOLAM HCL 2 MG/2ML IJ SOLN
INTRAMUSCULAR | Status: DC | PRN
  Administered 2015-08-18: 2 mg via INTRAVENOUS

## 2015-08-18 MED ORDER — METHYLENE BLUE 1 % INJ SOLN
INTRAMUSCULAR | Status: AC
  Filled 2015-08-18: qty 10

## 2015-08-18 MED ORDER — SODIUM CHLORIDE 0.9 % IJ SOLN
INTRAMUSCULAR | Status: AC
  Filled 2015-08-18: qty 10

## 2015-08-18 MED ORDER — FAMOTIDINE 20 MG PO TABS
20.0000 mg | ORAL_TABLET | Freq: Once | ORAL | Status: AC
  Administered 2015-08-18: 20 mg via ORAL

## 2015-08-18 MED ORDER — LACTATED RINGERS IV SOLN
INTRAVENOUS | Status: DC
  Administered 2015-08-18: 50 mL/h via INTRAVENOUS

## 2015-08-18 MED ORDER — FAMOTIDINE 20 MG PO TABS
ORAL_TABLET | ORAL | Status: AC
  Administered 2015-08-18: 20 mg via ORAL
  Filled 2015-08-18: qty 1

## 2015-08-18 MED ORDER — KETOROLAC TROMETHAMINE 30 MG/ML IJ SOLN
INTRAMUSCULAR | Status: DC | PRN
  Administered 2015-08-18: 30 mg via INTRAVENOUS

## 2015-08-18 MED ORDER — HYDROCODONE-ACETAMINOPHEN 5-325 MG PO TABS
1.0000 | ORAL_TABLET | ORAL | Status: DC | PRN
  Administered 2015-08-18: 1 via ORAL

## 2015-08-18 MED ORDER — GLYCOPYRROLATE 0.2 MG/ML IJ SOLN
INTRAMUSCULAR | Status: DC | PRN
  Administered 2015-08-18: 0.2 mg via INTRAVENOUS

## 2015-08-18 MED ORDER — FENTANYL CITRATE (PF) 100 MCG/2ML IJ SOLN
INTRAMUSCULAR | Status: DC | PRN
  Administered 2015-08-18: 25 ug via INTRAVENOUS
  Administered 2015-08-18: 50 ug via INTRAVENOUS
  Administered 2015-08-18 (×2): 25 ug via INTRAVENOUS
  Administered 2015-08-18: 50 ug via INTRAVENOUS
  Administered 2015-08-18 (×5): 25 ug via INTRAVENOUS

## 2015-08-18 MED ORDER — HYDROCODONE-ACETAMINOPHEN 5-325 MG PO TABS: 1.0000 | ORAL_TABLET | ORAL | Status: DC | PRN

## 2015-08-18 MED ORDER — ONDANSETRON HCL 4 MG/2ML IJ SOLN
INTRAMUSCULAR | Status: DC | PRN
  Administered 2015-08-18: 4 mg via INTRAVENOUS

## 2015-08-18 MED ORDER — ACETAMINOPHEN 10 MG/ML IV SOLN
INTRAVENOUS | Status: DC | PRN
  Administered 2015-08-18: 1000 mg via INTRAVENOUS

## 2015-08-18 MED ORDER — METHYLENE BLUE 1 % INJ SOLN
INTRAMUSCULAR | Status: DC | PRN
  Administered 2015-08-18: 5 mL via INTRADERMAL

## 2015-08-18 SURGICAL SUPPLY — 54 items
APPLIER CLIP 11 MED OPEN (CLIP)
APPLIER CLIP 13 LRG OPEN (CLIP)
BANDAGE ELASTIC 6 CLIP NS LF (GAUZE/BANDAGES/DRESSINGS) ×2 IMPLANT
BLADE SURG 15 STRL SS SAFETY (BLADE) ×2 IMPLANT
BNDG GAUZE 4.5X4.1 6PLY STRL (MISCELLANEOUS) ×2 IMPLANT
BULB RESERV EVAC DRAIN JP 100C (MISCELLANEOUS) ×2 IMPLANT
CANISTER SUCT 1200ML W/VALVE (MISCELLANEOUS) ×2 IMPLANT
CHLORAPREP W/TINT 26ML (MISCELLANEOUS) ×2 IMPLANT
CLIP APPLIE 11 MED OPEN (CLIP) IMPLANT
CLIP APPLIE 13 LRG OPEN (CLIP) IMPLANT
CNTNR SPEC 2.5X3XGRAD LEK (MISCELLANEOUS) ×1
CONT SPEC 4OZ STER OR WHT (MISCELLANEOUS) ×1
CONTAINER SPEC 2.5X3XGRAD LEK (MISCELLANEOUS) ×1 IMPLANT
COVER PROBE FLX POLY STRL (MISCELLANEOUS) IMPLANT
DEVICE DUBIN SPECIMEN MAMMOGRA (MISCELLANEOUS) IMPLANT
DRAIN CHANNEL JP 15F RND 16 (MISCELLANEOUS) ×2 IMPLANT
DRAPE LAPAROTOMY TRNSV 106X77 (MISCELLANEOUS) ×2 IMPLANT
DRAPE SHEET LG 3/4 BI-LAMINATE (DRAPES) ×2 IMPLANT
DRSG TELFA 3X8 NADH (GAUZE/BANDAGES/DRESSINGS) ×4 IMPLANT
ELECT CAUTERY BLADE TIP 2.5 (TIP) ×2
ELECTRODE CAUTERY BLDE TIP 2.5 (TIP) ×1 IMPLANT
GAUZE FLUFF 18X24 1PLY STRL (GAUZE/BANDAGES/DRESSINGS) ×4 IMPLANT
GAUZE SPONGE 4X4 12PLY STRL (GAUZE/BANDAGES/DRESSINGS) IMPLANT
GLOVE BIO SURGEON STRL SZ7.5 (GLOVE) ×4 IMPLANT
GLOVE INDICATOR 8.0 STRL GRN (GLOVE) ×4 IMPLANT
GOWN STRL REUS W/ TWL LRG LVL3 (GOWN DISPOSABLE) ×2 IMPLANT
GOWN STRL REUS W/TWL LRG LVL3 (GOWN DISPOSABLE) ×2
KIT RM TURNOVER STRD PROC AR (KITS) ×2 IMPLANT
LABEL OR SOLS (LABEL) IMPLANT
NDL SAFETY 18GX1.5 (NEEDLE) ×2 IMPLANT
NDL SAFETY 22GX1.5 (NEEDLE) IMPLANT
PACK BASIN MINOR ARMC (MISCELLANEOUS) ×2 IMPLANT
PAD GROUND ADULT SPLIT (MISCELLANEOUS) ×2 IMPLANT
PIN SAFETY STRL (MISCELLANEOUS) ×2 IMPLANT
SHEARS FOC LG CVD HARMONIC 17C (MISCELLANEOUS) IMPLANT
SLEVE PROBE SENORX GAMMA FIND (MISCELLANEOUS) ×2 IMPLANT
SPONGE LAP 18X18 5 PK (GAUZE/BANDAGES/DRESSINGS) ×2 IMPLANT
STRIP CLOSURE SKIN 1/2X4 (GAUZE/BANDAGES/DRESSINGS) ×4 IMPLANT
SUT ETHILON 3-0 FS-10 30 BLK (SUTURE) ×2
SUT SILK 0 (SUTURE)
SUT SILK 0 30XBRD TIE 6 (SUTURE) IMPLANT
SUT SILK 3 0 (SUTURE) ×1
SUT SILK 3-0 18XBRD TIE 12 (SUTURE) ×1 IMPLANT
SUT VIC AB 2-0 CT1 27 (SUTURE) ×2
SUT VIC AB 2-0 CT1 TAPERPNT 27 (SUTURE) ×2 IMPLANT
SUT VIC AB 2-0 CT2 27 (SUTURE) IMPLANT
SUT VIC AB 3-0 SH 27 (SUTURE) ×1
SUT VIC AB 3-0 SH 27X BRD (SUTURE) ×1 IMPLANT
SUT VIC AB 4-0 FS2 27 (SUTURE) ×2 IMPLANT
SUT VICRYL+ 3-0 144IN (SUTURE) ×2 IMPLANT
SUTURE EHLN 3-0 FS-10 30 BLK (SUTURE) ×1 IMPLANT
SWABSTK COMLB BENZOIN TINCTURE (MISCELLANEOUS) ×2 IMPLANT
SYRINGE 10CC LL (SYRINGE) ×2 IMPLANT
TAPE TRANSPORE STRL 2 31045 (GAUZE/BANDAGES/DRESSINGS) IMPLANT

## 2015-08-18 NOTE — Anesthesia Postprocedure Evaluation (Signed)
  Anesthesia Post-op Note  Patient: Heather Burke  Procedure(s) Performed: Procedure(s): SIMPLE MASTECTOMY (Left) SENTINEL NODE BIOPSY (Left)  Anesthesia type:General  Patient location: PACU  Post pain: Pain level controlled  Post assessment: Post-op Vital signs reviewed, Patient's Cardiovascular Status Stable, Respiratory Function Stable, Patent Airway and No signs of Nausea or vomiting  Post vital signs: Reviewed and stable  Last Vitals:  Filed Vitals:   08/18/15 1300  BP: 159/91  Pulse: 78  Temp:   Resp: 16    Level of consciousness: awake, alert  and patient cooperative  Complications: No apparent anesthesia complications

## 2015-08-18 NOTE — Discharge Instructions (Signed)
Total or Modified Radical Mastectomy, Care After Refer to this sheet in the next few weeks. These instructions provide you with information on caring for yourself after your procedure. Your caregiver may also give you more specific instructions. Your treatment has been planned according to current medical practices, but problems sometimes occur. Call your caregiver if you have any problems or questions after your procedure. ACTIVITY  Your caregiver will advise you when you may resume strenuous activities, driving, and sports.  After the drain(s) are removed, you may do light housework. Avoid heavy lifting, carrying, or pushing. You should not be lifting anything heavier than 5 lbs.  Take frequent rest periods. You may tire more easily than usual.  Always rest and elevate the arm affected by your surgery for a period of time equal to your activity time.  Continue doing the exercises given to you by the physical therapist/occupational therapist even after full range of motion has returned. The amount of time this takes will vary from person to person.  After normal range of motion has returned, some stiffness and soreness may persist for 2-3 months. This is normal and will subside.  Begin sports or strenuous activities in moderation. This will give you a chance to rebuild your endurance. Continue to be cautious of heavy lifting or carrying (no more than 10 lbs.) with your affected arm.  You may return to work as recommended by your caregiver. NUTRITION  You may resume your normal diet.  Make sure you drink plenty of fluids (6-8 glasses a day).  Eat a well-balanced diet. Including daily portions of food from government recommended food groups:  Grains.  Vegetables.  Fruits.  Milk.  Meat & beans.  Oils. Visit http://james-garner.info/ for more information HYGIENE  You may wash your hair.  If your incision (cut from surgery) is closed, you may shower or tub bathe, unless instructed  otherwise by your doctor. FEVER  If you feel feverish or have shaking chills, take your temperature. If your temperature is 102 F (38.9 C) or above, call your caregiver. The fever may mean there is an infection.  If you call early, infection can be treated with antibiotics and hospitalization may be avoided. PAIN CONTROL  Mild discomfort may occur.  You may need to take an over-the-counter pain medication or a medication prescribed by your caregiver.  Call your caregiver if you experience increased pain. INCISION CARE  Check your incision daily for increased redness, drainage, swelling, or separation of skin.  Call your caregiver if any of the above are noted. ARM AND HAND CARE  If the lymph nodes under your arm were removed with a modified radical mastectomy, there may be a greater tendency for the arm to swell.  Try to avoid having blood pressures taken, blood drawn, or injections given in the affected arm. This is the arm on the same side as the surgery.  Use hand lotion to soften cuticles instead of cutting them to avoid cutting yourself.  Be careful when shaving your under arms. Use an electric shaver if possible. You may use a deodorant after the incision has completely healed. Until then, clean under your arms with hydrogen peroxide.  Use reasonable precaution when cooking, sewing, and gardening to avoid burning or needle or thorn pricks.  Do not weigh your arm straight down with a package or your purse.  Follow the exercises and instructions given to you by the physical therapist/occupational therapist and your caregiver. FOLLOW-UP APPOINTMENT Call your caregiver for a follow-up  appointment as directed. PROSTHESIS INFORMATION Wear your temporary prosthesis (artificial breast) until your caregiver gives you permission to purchase a permanent one. This will depend upon your rate of healing. We suggest you also wait until you are physically and emotionally ready to shop for  one. The suitability depends on several individual factors. We do not endorse any particular prosthesis, but suggest you try several until you are satisfied with appearance and fit. A list of stores may be obtained from your local [place] at [employer] or 1-800-ACS-2345 (1-425-390-5025). A permanent prosthesis is medically necessary to restore balance. It is also income tax deductible. Be sure all receipts are marked "surgical". It is not essential to purchase a bra. You may sew a pocket into your regular bra. Note: Remember to take all of your medical insurance information with you when shopping for your prosthesis. SELECTING A PROSTHESIS FITTER You may want to ask the following questions when selecting a fitter:  What styles and brands of forms are carried in stock?  How long have the forms been on the market and have there been any problems with them?  Why would one form be better than another?  How long should a particular form last?  May I wear the form for a trial period without obligation?  Do the forms require a prosthetic bra? If so, what is the price range? Must I always wear that style?  If alterations to the bra are necessary, can they be done at this location or be sent out?  Will I be charged for alterations?  Will I receive suggestions on how to alter my own wardrobe, if necessary?  Will you special order forms or bras if necessary?  Are fitters always available to meet my needs?  What kinds of garments should be worn for the fitting?  Are lounge wear, swim wear, and accessories available?  If I have insurance coverage or Medicare, will you suggest ways for processing the paper work?  Do you keep complete records so that mail reordering is possible?  How are warranty claims handled if I have a problem with the form? Document Released: 07/28/2004 Document Revised: 12/10/2013 Document Reviewed: 04/01/2008 [facility] Patient Information 2015  [place], [place]. This information is not intended to replace advice given to you by your health care provider. Make sure you discuss any questions you have with your health care provider. General Anesthesia, Care After Refer to this sheet in the next few weeks. These instructions provide you with information on caring for yourself after your procedure. Your health care provider may also give you more specific instructions. Your treatment has been planned according to current medical practices, but problems sometimes occur. Call your health care provider if you have any problems or questions after your procedure. WHAT TO EXPECT AFTER THE PROCEDURE After the procedure, it is typical to experience:  Sleepiness.  Nausea and vomiting. HOME CARE INSTRUCTIONS  For the first 24 hours after general anesthesia:  Have a responsible person with you.  Do not drive a car. If you are alone, do not take public transportation.  Do not drink alcohol.  Do not take medicine that has not been prescribed by your health care provider.  Do not sign important papers or make important decisions.  You may resume a normal diet and activities as directed by your health care provider.  Change bandages (dressings) as directed.  If you have questions or problems that seem related to general anesthesia, call the hospital and ask  for the anesthetist or anesthesiologist on call. SEEK MEDICAL CARE IF:  You have nausea and vomiting that continue the day after anesthesia.  You develop a rash. SEEK IMMEDIATE MEDICAL CARE IF:   You have difficulty breathing.  You have chest pain.  You have any allergic problems. Document Released: 03/13/2001 Document Revised: 12/10/2013 Document Reviewed: 06/20/2013 [facility] Patient Information 2015 [place], [place]. This information is not intended to replace advice given to you by your health care provider. Make sure you discuss any questions you have with your health care  provider.

## 2015-08-18 NOTE — Op Note (Signed)
Preoperative diagnosis: Invasive lobular/invasive mammary carcinoma and left breast, multicentric.  Postoperative diagnosis: Same.  Operative procedure: Left simple mastectomy with sentinel node biopsy.  Operative surgeon: Hervey Ard, M.D.  Anesthesia: Gen. by LMA.  History of blood loss 0.5 mL.  Clinical note: This 64 year old was found to have multifocal breast cancer including both invasive lobular and invasive  mammary carcinoma. She elected mastectomy as treatment. She was injected with technetium sulfur colloid prior to the procedure as well as 5 mL of methylene blue previously diluted to 1 with normal saline. (Sees 5 mL fine) sees.  Operative note with the patient under adequate general anesthesia the breast chest and axilla was prepped with Clorpactin and draped. An elliptical incision was outlined and the skin incised sharply. The remaining dissection was completed with electrocautery. Hemostasis was what for cautery and 30 Vicryls ties and 30 Vicryls suture ligatures. The breast flaps were elevated to the clavicle superiorly, sternum medially, rectus fascias inferiorly and the serratus muscle laterally. The axillary embolus was opened. No blue lymphatics were identified. 2 hot nodes were identified and frozen section report from pathology was negative for macromastia static disease. The breast was removed from the chest wall taking the fascia of the pectoralis muscle with the specimen. The wound was irrigated with sterile water. A single Blake drain was brought out through the inferior medial flap was anchored into position with 3-0 nylon. The flaps were approximated with running 2-0 Vicryls sutures in 2 segments. Benzoin, Steri-Strips and Telfa pads were applied to the surgical site. The drain was placed to self suction. Fluff gauze, Kerlix and Ace wrap was applied.  The patient tolerated the procedure well was taken the recovery room in stable condition.

## 2015-08-18 NOTE — H&P (Signed)
No change in clinical history. Normal cardiopulmonary exam. For left mastectomy, SLN.

## 2015-08-18 NOTE — Transfer of Care (Signed)
Immediate Anesthesia Transfer of Care Note  Patient: Heather Burke  Procedure(s) Performed: Procedure(s): SIMPLE MASTECTOMY (Left) SENTINEL NODE BIOPSY (Left)  Patient Location: PACU  Anesthesia Type:General  Level of Consciousness: sedated  Airway & Oxygen Therapy: Patient Spontanous Breathing and Patient connected to face mask oxygen  Post-op Assessment: Report given to RN and Post -op Vital signs reviewed and stable  Post vital signs: Reviewed and stable  Last Vitals:  Filed Vitals:   08/18/15 1128  BP: 104/64  Pulse: 77  Temp: 37.3 C  Resp: 15    Complications: No apparent anesthesia complications

## 2015-08-18 NOTE — Anesthesia Preprocedure Evaluation (Signed)
Anesthesia Evaluation  Patient identified by MRN, date of birth, ID band Patient awake    Reviewed: Allergy & Precautions, NPO status   History of Anesthesia Complications (+) PONV and history of anesthetic complications  Airway Mallampati: III  TM Distance: <3 FB Neck ROM: Full   Comment: Small mouth Dental  (+) Caps   Pulmonary Current Smoker,    Pulmonary exam normal       Cardiovascular hypertension, Normal cardiovascular exam    Neuro/Psych negative neurological ROS  negative psych ROS   GI/Hepatic negative GI ROS, Neg liver ROS,   Endo/Other  Hypothyroidism   Renal/GU negative Renal ROS  negative genitourinary   Musculoskeletal negative musculoskeletal ROS (+)   Abdominal Normal abdominal exam  (+)   Peds negative pediatric ROS (+)  Hematology negative hematology ROS (+)   Anesthesia Other Findings   Reproductive/Obstetrics                             Anesthesia Physical Anesthesia Plan  ASA: II  Anesthesia Plan: General   Post-op Pain Management:    Induction: Intravenous  Airway Management Planned: LMA  Additional Equipment:   Intra-op Plan:   Post-operative Plan: Extubation in OR  Informed Consent: I have reviewed the patients History and Physical, chart, labs and discussed the procedure including the risks, benefits and alternatives for the proposed anesthesia with the patient or authorized representative who has indicated his/her understanding and acceptance.   Dental advisory given  Plan Discussed with: CRNA and Surgeon  Anesthesia Plan Comments:         Anesthesia Quick Evaluation

## 2015-08-19 ENCOUNTER — Other Ambulatory Visit: Payer: BLUE CROSS/BLUE SHIELD

## 2015-08-20 ENCOUNTER — Telehealth: Payer: Self-pay | Admitting: *Deleted

## 2015-08-20 ENCOUNTER — Other Ambulatory Visit: Payer: Self-pay | Admitting: *Deleted

## 2015-08-20 DIAGNOSIS — C50912 Malignant neoplasm of unspecified site of left female breast: Secondary | ICD-10-CM

## 2015-08-20 LAB — SURGICAL PATHOLOGY

## 2015-08-20 NOTE — Telephone Encounter (Signed)
-----   Message from [name], MD sent at 08/20/2015  4:19 PM EDT ----- Please arrange an appointment with medical oncology for Wednesday or later next week. Evaluation for adjuvant treatment of breast cancer. Thank you  ----- Message -----    From: [facility]    Sent: 08/20/2015   3:10 PM      To: [name], MD

## 2015-08-20 NOTE — Telephone Encounter (Signed)
Patient to see Dr. Oliva Bustard on 08-27-15 at 8 am. She is aware of date, time, and instructions.

## 2015-08-20 NOTE — Progress Notes (Unsigned)
Colette at the [facility] is working on an appointment for patient.

## 2015-08-21 ENCOUNTER — Ambulatory Visit (INDEPENDENT_AMBULATORY_CARE_PROVIDER_SITE_OTHER): Payer: BLUE CROSS/BLUE SHIELD

## 2015-08-21 ENCOUNTER — Telehealth: Payer: Self-pay

## 2015-08-21 DIAGNOSIS — C50912 Malignant neoplasm of unspecified site of left female breast: Secondary | ICD-10-CM

## 2015-08-21 NOTE — Progress Notes (Signed)
Patient ID: Heather Burke, female   DOB: July 18, 1951, 64 y.o.   MRN: [id number] Patient came in today for a wound check. The wound is clean, with no signs of infection noted. Dressing changed. Follow up as scheduled.

## 2015-08-21 NOTE — Telephone Encounter (Signed)
Phoned patient to see if she has any post-op needs.  Had left mastecttomy on 08/18/15.  States she is in Dr. Dwyane Luo office waiting for dressing removal..  Explained navigator would like to accompany to initial Med-Onc visit on 08/27/15 with Dr. Oliva Bustard.  Patient agreeable.

## 2015-08-25 ENCOUNTER — Encounter: Payer: Self-pay | Admitting: General Surgery

## 2015-08-25 ENCOUNTER — Ambulatory Visit (INDEPENDENT_AMBULATORY_CARE_PROVIDER_SITE_OTHER): Payer: BLUE CROSS/BLUE SHIELD | Admitting: General Surgery

## 2015-08-25 VITALS — BP 162/70 | Wt 173.0 lb

## 2015-08-25 DIAGNOSIS — C50912 Malignant neoplasm of unspecified site of left female breast: Secondary | ICD-10-CM

## 2015-08-25 NOTE — Patient Instructions (Signed)
The patient is aware to call back for any questions or concerns.  

## 2015-08-25 NOTE — Progress Notes (Signed)
Patient ID: Heather Burke, female   DOB: 1951-01-10, 64 y.o.   MRN: [id number]  Chief Complaint  Patient presents with  . Routine Post Op    Mastectomy    HPI Heather Burke is a 64 y.o. female.  Presents today for left mastectomy post-Op. Patient states she is recovering well. She feels a little tired today, with some mild soreness. Pain is 0/10.  HPI  Past Medical History  Diagnosis Date  . Hypertension   . Hyperlipidemia   . Thyroid disease   . Cancer 06-08-15    invasive breast, ER+,PR+, Her 2 neg (FISH neg) x2.  Marland Kitchen Hypothyroidism   . GERD (gastroesophageal reflux disease)   . PONV (postoperative nausea and vomiting)     with Hysterectomy  . Heart murmur 2016    newly diagnosed, slight murmur    Past Surgical History  Procedure Laterality Date  . Breast biopsy Right 2002    negative/ Heather Burke  . Core biopsy Left 06/08/15  . Abdominal hysterectomy    . Tonsillectomy    . Breast biopsy Left 06-08-15    INVASIVE MAMMARY CARCINOMA WITH PARTIAL SOLID PATTERN.   . Gum surgery  2006  . Simple mastectomy with axillary sentinel node biopsy Left 08/18/2015    Procedure: SIMPLE MASTECTOMY;  Surgeon: Heather Bellow, MD;  Location: [facility] ORS;  Service: General;  Laterality: Left;  . Sentinel node biopsy Left 08/18/2015    Procedure: SENTINEL NODE BIOPSY;  Surgeon: Heather Bellow, MD;  Location: [facility] ORS;  Service: General;  Laterality: Left;    Family History  Problem Relation Age of Onset  . Cancer Father     prostate    Social History Social History  Substance Use Topics  . Smoking status: Current Every Day Smoker -- 2.00 packs/day for 44 years    Types: Cigarettes  . Smokeless tobacco: Never Used  . Alcohol Use: 0.0 oz/week    0 Standard drinks or equivalent per week     Comment: 3-4 beers/day    No Known Allergies  Current Outpatient Prescriptions  Medication Sig Dispense Refill  . aspirin 81 MG tablet Take 81 mg by mouth daily.    . calcium carbonate  (OS-CAL) 600 MG TABS tablet Take 600 mg by mouth daily.     . Cholecalciferol (D3 ADULT PO) Take 1 tablet by mouth daily.     Marland Kitchen FIBER PO Take by mouth.    . fluticasone (FLONASE) 50 MCG/ACT nasal spray   5  . lovastatin (MEVACOR) 20 MG tablet Take 20 mg by mouth at bedtime.    . Omega-3 Fatty Acids (FISH OIL) 1200 MG CAPS Take by mouth daily.    Marland Kitchen SYNTHROID 88 MCG tablet TAKE 1 TABLET EVERY DAY --- (BRAND NAME ONLY)  6  . HYDROcodone-acetaminophen (NORCO) 5-325 MG per tablet Take 1-2 tablets by mouth every 4 (four) hours as needed. (Patient not taking: Reported on 08/25/2015) 30 tablet 0   No current facility-administered medications for this visit.    Review of Systems Review of Systems  Constitutional: Negative.   Respiratory: Negative.   Cardiovascular: Negative.     Blood pressure 162/70, weight 173 lb (78.472 kg).  Physical Exam Physical Exam  Constitutional: She is oriented to person, place, and time. She appears well-developed and well-nourished.  Pulmonary/Chest:    Some bruising noted.Steri strips in place. Drain intact.  Musculoskeletal:       Arms: Neurological: She is alert and oriented to person, place,  and time.  Skin: Skin is warm and dry.  Psychiatric: Her behavior is normal.    Data Reviewed A. LYMPH NODES, SENTINEL #1 AND #2; EXCISION:  - TWO LYMPH NODES NEGATIVE FOR MALIGNANCY (0/2).   B. BREAST, LEFT; MASTECTOMY:  - THREE SEPARATE FOCI OF INVASIVE LOBULAR CARCINOMA AT 11:30, 2:00, AND  RANDOM UPPER INNER QUADRANT LOCATIONS MEASURING 18 MM, 9 MM, AND 0.9 MM  RESPECTIVELY.  - SINGLE FOCUS OF INVASIVE MAMMARY CARCINOMA WITH DUCTAL CARCINOMA IN  SITU AT 3:00 LOCATION MEASURING 5 MM.  - LOBULAR CARCINOMA IN SITU AND CALCIFICATIONS ARE ASSOCIATED WITH AND  AWAY FROM FOCI OF INVASIVE LOBULAR CARCINOMA.  - ONE INTRAMAMMARY LYMPH NODE NEGATIVE FOR MALIGNANCY (0/1).  - SEE CANCER SUMMARY BELOW.   Comment:  Two sites of the invasive carcinoma were previously  biopsied  corresponding to the 11:30 and 3:00 locations in the current specimen.  ER, PR, HER2 results from prior biopsy:  11:30 carcinoma: ER positive, PR positive, HER2 FISH not amplified  3:00 carcinoma: ER positive, PR negative, HER2 FISH not amplified   C. LYMPH NODES, AXILLARY FAT; EXCISION:  - ONE LYMPH NODE NEGATIVE FOR MALIGNANCY (0/1).  - DEEPER SECTIONS EXAMINED.   11:30 carcinoma: ER positive, PR positive, HER2 FISH not amplified  3:00 carcinoma: ER positive, PR negative, HER2 FISH not amplified    Assessment    Multifocal left breast cancer status post mastectomy, multiple sentinel node biopsies.    Plan    The patient is doing well. Drainage volume is still well above 30 mL per day. The compressive wrap was removed. Tegaderm applied to the drain site. She may shower.  Medical oncology assessment is scheduled for 08/27/2015 with Heather Burke. 2 of the 4 known primaries were tested and receptor status shown above. I will leave its Heather Burke to determine if he desires to have the 2 lobular carcinoma would be highly unlikely to be ER negative.      The patient may shower. She was again encouraged to avoid repetitive activities with the left shoulder.  Continue to record drain output. Follow up next week.  PCP: Heather Burke   Heather Burke 08/25/2015, 3:09 PM

## 2015-08-27 ENCOUNTER — Encounter: Payer: Self-pay | Admitting: Oncology

## 2015-08-27 ENCOUNTER — Inpatient Hospital Stay: Payer: BLUE CROSS/BLUE SHIELD | Attending: Oncology | Admitting: Oncology

## 2015-08-27 ENCOUNTER — Encounter: Payer: Self-pay | Admitting: *Deleted

## 2015-08-27 VITALS — BP 151/87 | HR 92 | Temp 97.9°F | Wt 171.2 lb

## 2015-08-27 DIAGNOSIS — E785 Hyperlipidemia, unspecified: Secondary | ICD-10-CM | POA: Diagnosis not present

## 2015-08-27 DIAGNOSIS — I10 Essential (primary) hypertension: Secondary | ICD-10-CM | POA: Diagnosis not present

## 2015-08-27 DIAGNOSIS — Z170 Estrogen receptor positive status [ER+]: Secondary | ICD-10-CM | POA: Insufficient documentation

## 2015-08-27 DIAGNOSIS — F419 Anxiety disorder, unspecified: Secondary | ICD-10-CM | POA: Insufficient documentation

## 2015-08-27 DIAGNOSIS — Z9012 Acquired absence of left breast and nipple: Secondary | ICD-10-CM | POA: Diagnosis not present

## 2015-08-27 DIAGNOSIS — K219 Gastro-esophageal reflux disease without esophagitis: Secondary | ICD-10-CM | POA: Insufficient documentation

## 2015-08-27 DIAGNOSIS — F17210 Nicotine dependence, cigarettes, uncomplicated: Secondary | ICD-10-CM | POA: Insufficient documentation

## 2015-08-27 DIAGNOSIS — Z79899 Other long term (current) drug therapy: Secondary | ICD-10-CM | POA: Insufficient documentation

## 2015-08-27 DIAGNOSIS — C50412 Malignant neoplasm of upper-outer quadrant of left female breast: Secondary | ICD-10-CM | POA: Insufficient documentation

## 2015-08-27 DIAGNOSIS — J309 Allergic rhinitis, unspecified: Secondary | ICD-10-CM | POA: Insufficient documentation

## 2015-08-27 DIAGNOSIS — E559 Vitamin D deficiency, unspecified: Secondary | ICD-10-CM | POA: Insufficient documentation

## 2015-08-27 DIAGNOSIS — Z1239 Encounter for other screening for malignant neoplasm of breast: Secondary | ICD-10-CM | POA: Insufficient documentation

## 2015-08-27 DIAGNOSIS — E039 Hypothyroidism, unspecified: Secondary | ICD-10-CM | POA: Diagnosis not present

## 2015-08-27 DIAGNOSIS — C50912 Malignant neoplasm of unspecified site of left female breast: Secondary | ICD-10-CM

## 2015-08-27 DIAGNOSIS — R011 Cardiac murmur, unspecified: Secondary | ICD-10-CM | POA: Insufficient documentation

## 2015-08-27 DIAGNOSIS — F17200 Nicotine dependence, unspecified, uncomplicated: Secondary | ICD-10-CM | POA: Insufficient documentation

## 2015-08-27 DIAGNOSIS — Z79811 Long term (current) use of aromatase inhibitors: Secondary | ICD-10-CM | POA: Insufficient documentation

## 2015-08-27 DIAGNOSIS — E1129 Type 2 diabetes mellitus with other diabetic kidney complication: Secondary | ICD-10-CM | POA: Insufficient documentation

## 2015-08-27 DIAGNOSIS — Z7982 Long term (current) use of aspirin: Secondary | ICD-10-CM | POA: Diagnosis not present

## 2015-08-27 NOTE — Progress Notes (Signed)
  Oncology Nurse Navigator Documentation  Referral date to RadOnc/MedOnc: 08/27/15 (08/27/15 1200) Navigator Encounter Type: Initial MedOnc (08/27/15 1200) Patient Visit Type: Medonc (08/27/15 1200)   Barriers/Navigation Needs: No barriers at this time (08/27/15 1200)    Met patient during her initial medical oncology visit with Dr. Oliva Bustard.  Reviewed plan of care with her and her support person.  Answered questions.  She is to call if she has any questions or needs.  She is to follow up with Dr. Maeola Harman when her mammoprint results are back.            Time Spent with Patient: 60 (08/27/15 1200)

## 2015-08-27 NOTE — Progress Notes (Signed)
Patient does not have living will.  Current every day smoker. 

## 2015-08-28 ENCOUNTER — Ambulatory Visit (INDEPENDENT_AMBULATORY_CARE_PROVIDER_SITE_OTHER): Payer: BLUE CROSS/BLUE SHIELD | Admitting: General Surgery

## 2015-08-28 ENCOUNTER — Encounter: Payer: Self-pay | Admitting: General Surgery

## 2015-08-28 ENCOUNTER — Ambulatory Visit: Payer: Self-pay | Admitting: Family Medicine

## 2015-08-28 VITALS — BP 142/74 | HR 78 | Resp 12 | Ht 63.0 in | Wt 176.0 lb

## 2015-08-28 DIAGNOSIS — C50912 Malignant neoplasm of unspecified site of left female breast: Secondary | ICD-10-CM

## 2015-08-28 NOTE — Patient Instructions (Signed)
Patient to return as scheduled.  

## 2015-08-28 NOTE — Progress Notes (Signed)
Patient ID: Heather Burke, female   DOB: 12/05/1951, 64 y.o.   MRN: [id number]  Chief Complaint  Patient presents with  . Follow-up    left mastectomy     HPI Heather Burke is a 64 y.o. female here today to follow up with swelling under near left arm and at left mastectomy site.  HPI  Past Medical History  Diagnosis Date  . Hypertension   . Hyperlipidemia   . Thyroid disease   . Cancer 06-08-15    invasive breast, ER+,PR+, Her 2 neg (FISH neg) x2.  Marland Kitchen Hypothyroidism   . GERD (gastroesophageal reflux disease)   . PONV (postoperative nausea and vomiting)     with Hysterectomy  . Heart murmur 2016    newly diagnosed, slight murmur    Past Surgical History  Procedure Laterality Date  . Breast biopsy Right 2002    negative/ Dr. Bary Castilla  . Core biopsy Left 06/08/15  . Abdominal hysterectomy    . Tonsillectomy    . Breast biopsy Left 06-08-15    INVASIVE MAMMARY CARCINOMA WITH PARTIAL SOLID PATTERN.   . Gum surgery  2006  . Simple mastectomy with axillary sentinel node biopsy Left 08/18/2015    Procedure: SIMPLE MASTECTOMY;  Surgeon: [name], MD;  Location: [facility] ORS;  Service: General;  Laterality: Left;  . Sentinel node biopsy Left 08/18/2015    Procedure: SENTINEL NODE BIOPSY;  Surgeon: [name], MD;  Location: [facility] ORS;  Service: General;  Laterality: Left;    Family History  Problem Relation Age of Onset  . Cancer Father     prostate    Social History Social History  Substance Use Topics  . Smoking status: Current Every Day Smoker -- 2.00 packs/day for 44 years    Types: Cigarettes  . Smokeless tobacco: Never Used  . Alcohol Use: 0.0 oz/week    0 Standard drinks or equivalent per week     Comment: 3-4 beers/day    No Known Allergies  Current Outpatient Prescriptions  Medication Sig Dispense Refill  . aspirin 81 MG tablet Take 81 mg by mouth daily.    . calcium carbonate (OS-CAL) 600 MG TABS tablet Take 600 mg by mouth daily.     .  Cholecalciferol (D3 ADULT PO) Take 1 tablet by mouth daily.     Marland Kitchen FIBER PO Take by mouth.    . fluticasone (FLONASE) 50 MCG/ACT nasal spray   5  . HYDROcodone-acetaminophen (NORCO) 5-325 MG per tablet Take 1-2 tablets by mouth every 4 (four) hours as needed. 30 tablet 0  . losartan-hydrochlorothiazide (HYZAAR) 100-25 MG per tablet Take 1 tablet by mouth daily.    Marland Kitchen lovastatin (MEVACOR) 20 MG tablet Take 20 mg by mouth at bedtime.    . Omega-3 Fatty Acids (FISH OIL) 1200 MG CAPS Take by mouth daily.    Marland Kitchen SYNTHROID 88 MCG tablet TAKE 1 TABLET EVERY DAY --- (BRAND NAME ONLY)  6   No current facility-administered medications for this visit.    Review of Systems Review of Systems  Constitutional: Negative.   Respiratory: Negative.   Cardiovascular: Negative.     Blood pressure 142/74, pulse 78, resp. rate 12, height 5\' 3"  (1.6 m), weight 176 lb (79.833 kg).  Physical Exam Physical Exam  Constitutional: She is oriented to person, place, and time. She appears well-developed and well-nourished.  Neurological: She is alert and oriented to person, place, and time.  Skin: Skin is warm and dry.  Drain is intact and just a little swelling laterally and medially left mastectomy sites.   Assessment    No evidence of loculated fluid accumulation.    Plan    Follow-up as previously scheduled.     PCP:  Illa Level, Forest Gleason 09/01/2015, 11:59 AM

## 2015-08-30 NOTE — Progress Notes (Signed)
[place] @ [facility] [phone]) [phone]  Fax:(336) [place]: [birth year]  MR#: [id number]  VKP#:[id number]  Patient Care Team: [name], MD as PCP - General (Family Medicine) [name], MD (Family Medicine) [name], MD (General Surgery)  CHIEF COMPLAINT:  Chief Complaint  Patient presents with  . New Evaluation   1.  Carcinoma of breast (left) status post mastectomy in August of 2016 Multiple tumors.  Lobular cancer 18 mm, 9 mm, 0.9 mm ,  One  invasive mammary carcinoma 5 mm Estrogen receptor positive progesterone receptor positive HER-2 receptor negative   VISIT DIAGNOSIS:  Cancer of left breast    No history exists.    Oncology Flowsheet 08/18/2015  dexamethasone (DECADRON) IJ -  ondansetron (ZOFRAN) IV -    INTERVAL HISTORY:  [age]-year-old lady with abnormal mammogram and underwent further evaluation .  Most recent ultrasound and core biopsy was done on June of 2016 showing invasive breast cancer.  Patient does confirm regular self breast examination and CT did not feel any palpable mass Patient did have regular mammogram in the past. Patient underwent evaluation followed by mastectomy in multiple tumors were found.  The largest one was 18 mm.  Lobular carcinoma There was one small invasive ductal carcinoma . Estrogen receptor positive progesterone receptor positive HER-2 receptor negative Patient is here for further systemic and local adjuvant therapy REVIEW OF SYSTEMS:   GENERAL:  Feels good.  Active.  No fevers, sweats or weight loss. PERFORMANCE STATUS (ECOG):  0 HEENT:  No visual changes, runny nose, sore throat, mouth sores or tenderness. Lungs: No shortness of breath or cough.  No hemoptysis. Cardiac:  No chest pain, palpitations, orthopnea, or PND. GI:  No nausea, vomiting, diarrhea, constipation, melena or hematochezia. GU:  No urgency, frequency, dysuria, or hematuria. Musculoskeletal:  No  back pain.  No joint pain.  No muscle tenderness. Extremities:  No pain or swelling. Skin:  No rashes or skin changes. Neuro:  No headache, numbness or weakness, balance or coordination issues. Endocrine:  No diabetes, thyroid issues, hot flashes or night sweats. Psych:  No mood changes, depression or anxiety. Pain:  No focal pain. Review of systems:  All other systems reviewed and found to be negative.  As per HPI. Otherwise, a complete review of systems is negatve.  PAST MEDICAL HISTORY: Past Medical History  Diagnosis Date  . Hypertension   . Hyperlipidemia   . Thyroid disease   . Cancer 06-08-15    invasive breast, ER+,PR+, Her 2 neg (FISH neg) x2.  Marland Kitchen Hypothyroidism   . GERD (gastroesophageal reflux disease)   . PONV (postoperative nausea and vomiting)     with Hysterectomy  . Heart murmur 2016    newly diagnosed, slight murmur    PAST SURGICAL HISTORY: Past Surgical History  Procedure Laterality Date  . Breast biopsy Right 2002    negative/ Dr. Bary Castilla  . Core biopsy Left 06/08/15  . Abdominal hysterectomy    . Tonsillectomy    . Breast biopsy Left 06-08-15    INVASIVE MAMMARY CARCINOMA WITH PARTIAL SOLID PATTERN.   . Gum surgery  2006  . Simple mastectomy with axillary sentinel node biopsy Left 08/18/2015    Procedure: SIMPLE MASTECTOMY;  Surgeon: [name], MD;  Location: [facility] ORS;  Service: General;  Laterality: Left;  . Sentinel node biopsy Left 08/18/2015    Procedure: SENTINEL NODE BIOPSY;  Surgeon: [name], MD;  Location:  [facility] ORS;  Service: General;  Laterality: Left;    FAMILY HISTORY Family History  Problem Relation Age of Onset  . Cancer Father     prostate    GYNECOLOGIC HISTORY:  No LMP recorded. Patient has had a hysterectomy.     ADVANCED DIRECTIVES:  Patient does not have any living will or healthcare power of attorney.  Information was given .  Available resources had been discussed.  We will follow-up on subsequent  appointments regarding this issue  HEALTH MAINTENANCE: Social History  Substance Use Topics  . Smoking status: Current Every Day Smoker -- 2.00 packs/day for 44 years    Types: Cigarettes  . Smokeless tobacco: Never Used  . Alcohol Use: 0.0 oz/week    0 Standard drinks or equivalent per week     Comment: 3-4 beers/day       No Known Allergies  Current Outpatient Prescriptions  Medication Sig Dispense Refill  . aspirin 81 MG tablet Take 81 mg by mouth daily.    . calcium carbonate (OS-CAL) 600 MG TABS tablet Take 600 mg by mouth daily.     . Cholecalciferol (D3 ADULT PO) Take 1 tablet by mouth daily.     Marland Kitchen FIBER PO Take by mouth.    . fluticasone (FLONASE) 50 MCG/ACT nasal spray   5  . losartan-hydrochlorothiazide (HYZAAR) 100-25 MG per tablet Take 1 tablet by mouth daily.    Marland Kitchen lovastatin (MEVACOR) 20 MG tablet Take 20 mg by mouth at bedtime.    . Omega-3 Fatty Acids (FISH OIL) 1200 MG CAPS Take by mouth daily.    Marland Kitchen SYNTHROID 88 MCG tablet TAKE 1 TABLET EVERY DAY --- (BRAND NAME ONLY)  6  . HYDROcodone-acetaminophen (NORCO) 5-325 MG per tablet Take 1-2 tablets by mouth every 4 (four) hours as needed. 30 tablet 0   No current facility-administered medications for this visit.    OBJECTIVE: PHYSICAL EXAM: GENERAL:  Well developed, well nourished, sitting comfortably in the exam room in no acute distress. MENTAL STATUS:  Alert and oriented to person, place and time. .  RESPIRATORY:  Clear to auscultation without rales, wheezes or rhonchi. CARDIOVASCULAR:  Regular rate and rhythm without murmur, rub or gallop. BREAST:  Right breast without masses, skin changes or nipple discharge.  Left  Breast ; mastectomy ABDOMEN:  Soft, non-tender, with active bowel sounds, and no hepatosplenomegaly.  No masses. BACK:  No CVA tenderness.  No tenderness on percussion of the back or rib cage. SKIN:  No rashes, ulcers or lesions. EXTREMITIES: No edema, no skin discoloration or tenderness.  No  palpable cords. LYMPH NODES: No palpable cervical, supraclavicular, axillary or inguinal adenopathy  NEUROLOGICAL: Unremarkable. PSYCH:  Appropriate.  Filed Vitals:   08/27/15 0835  BP: 151/87  Pulse: 92  Temp: 97.9 F (36.6 C)     Body mass index is 30.34 kg/(m^2).    ECOG FS:0 - Asymptomatic  LAB RESULTS:  No visits with results within 5 Day(s) from this visit. Latest known visit with results is:  Admission on 08/18/2015, Discharged on 08/18/2015  Component Date Value Ref Range Status  . SURGICAL PATHOLOGY 08/18/2015    Final                   Value:Surgical Pathology CASE: [id number] PATIENT: Krystan Pascale Surgical Pathology Report     SPECIMEN SUBMITTED: A. Lymph node, sentinel #1 and #2 B. Breast, left C. Lymph nodes, axillary fat  CLINICAL HISTORY: None provided  PRE-OPERATIVE DIAGNOSIS: left breast  cancer  POST-OPERATIVE DIAGNOSIS: None provided    DIAGNOSIS: A. LYMPH NODES, SENTINEL #1 AND #2; EXCISION: - TWO LYMPH NODES NEGATIVE FOR MALIGNANCY (0/2).  B. BREAST, LEFT; MASTECTOMY: - THREE SEPARATE FOCI OF INVASIVE LOBULAR CARCINOMA AT 11:30, 2:00, AND RANDOM UPPER INNER QUADRANT LOCATIONS MEASURING 18 MM, 9 MM, AND 0.9 MM RESPECTIVELY. - SINGLE FOCUS OF INVASIVE MAMMARY CARCINOMA WITH DUCTAL CARCINOMA IN SITU AT 3:00 LOCATION MEASURING 5 MM. - LOBULAR CARCINOMA IN SITU AND CALCIFICATIONS ARE ASSOCIATED WITH AND AWAY FROM FOCI OF INVASIVE LOBULAR CARCINOMA. - ONE INTRAMAMMARY LYMPH NODE NEGATIVE FOR MALIGNANCY (0/1). - SEE CANCER SUMMARY BELOW.  Comment: Two sites of the invasive c                         arcinoma were previously biopsied corresponding to the 11:30 and 3:00 locations in the current specimen. ER, PR, HER2 results from prior biopsy: 11:30 carcinoma: ER positive, PR positive, HER2 FISH not amplified 3:00 carcinoma: ER positive, PR negative, HER2 FISH not amplified  C. LYMPH NODES, AXILLARY FAT; EXCISION: - ONE LYMPH NODE  NEGATIVE FOR MALIGNANCY (0/1). - DEEPER SECTIONS EXAMINED.   INVASIVE CARCINOMA OF THE BREAST: Complete Excision SPECIMEN Procedure:     Total mastectomy (including nipple and skin) Lymph Node Sampling:     Sentinel lymph node(s) Axillary dissection (partial or complete dissection) Lymph nodes present within the breast specimen Specimen Laterality:     Left TUMOR Presence of Invasive Carcinoma:    Histologic Type:    Invasive lobular carcinoma Histologic Grade:   Glandular (acinar) / tubular differentiation: Score 3 (< 10% of tumor area forming glandular / tubular structures) Nuclear pleomorphism: Score 1 (nuclei small with little i                         ncrease in size in comparison with normal breast epithelial cells, regular outlines, uniform nuclear chromatin, little variation in size) Mitotic rate:  Score 1 (<=3 mitoses per mm2) Overall grade: Grade 1 (scores of 3, 4 or 5) Ductal Carcinoma In Situ (DCIS):   DCIS is present Tumor Size / Focality:   Tumor size: size of largest invasive carcinoma: Greatest dimension of largest focus of invasion > 1 mm: 36mm Tumor focality:     Multiple foci of invasive carcinoma Number of foci:     4 Sizes of individual foci: invasive lobular: 18 mm, 9 mm, and 0.9 mm; invasive mammary NOS: 5 mm Tumor Extent Macroscopic and Microscopic Extent of Tumor Skin:     Skin invasion: Dermis and/or epidermis is negative for invasive carcinoma Skin satellite foci:     Negative for satellite foci Nipple DCIS:   DCIS does not involve the nipple epidermis Skeletal muscle:    Skeletal muscle is free of carcinoma Accessory Tumor Findings Lymph-Vascular Invasion: Not identified Treatment Effect:                          Response to Presurgical (Neoadjuvant) Therapy: No known presurgical therapy MARGINS Invasive Carcinoma: Margins negative for invasive carcinoma Distance from closest margin: 32mm Closest Negative Margin(s):   Posterior Ductal  Carcinoma In Situ (DCIS):   Margins negative for DCIS (DCIS present in specimen) Distance of DCIS from closest margin:   69mm Closest Negative Margin(s):   Posterior LYMPH NODES Regional Lymph Nodes:    Number of Sentinel Nodes Examined: Specify number 2 Number of lymph node(s)  examined:  Number 4 Lymph node involvement:  All lymph nodes are negative STAGE (pTNM) TNM Descriptors:    m (multiple foci of invasive carcinoma) Primary Tumor (Invasive Carcinoma) (pT):     pT1c:  tumor > 10 mm but <= 20 mm in greatest dimension Regional lymph nodes (pN) (choose a category based on lymph nodes received with the specimen;  immunohistochemistry and / or molecular studies are not required) Category (pN): pN0: no regional lymph node metastasis identif                         ied histologically Distant Metastasis (pM): Not applicable - pM cannot be determined from the submitted specimen(s)   Comment: Findings were communicated to Dr. Bary Castilla on 08/20/2015.   GROSS DESCRIPTION:  A. Intraoperative Consultation:     Received:  fresh     Specimen: sentinel node #1 and #2     Pathologic Evaluation: frozen section     Diagnosis: FS A1 and A2-2 lymph nodes negative for macro metastasis     Communicated to: Dr. Bary Castilla at 10:54 AM on 08/18/2015     Tissue submitted: 1 and 2  A.  Labeled: sentinel node #1 #2 Tissue Fragment(s): 1 Measurement: 3.5 x 2.5 x 0.6 cm Comment: fragment of yellow lobulated fibrofatty tissue, dissected to reveal 2 lymph node candidates, the first 2.5 x 0.8 x 0.6 cm and the second 1.2 x 0.6 x 0.6 cm, first lymph node candidate is trisected in the second lymph node candidate submitted intact  Entirely submitted in cassette(s): 1-2 sections of largest lymph node candidate frozen section remnants 2-remaining section o                         f largest lymph node and smaller lymph node candidate frozen section remnants  B. Labeled: left breast  Time in Fixative:  collected at 11 AM and placed in formalin at 1106 on 08/18/2015 Total formalin fixation time 9 hours  Cold Ischemic Time: 6 minutes  Type of mastectomy: simple  Weight of Specimen: 990 grams  Size of specimen: 22 x 19.1 x 3.8 cm  Orientation of specimen: skin ellipse marked with an M and L Deep-black Lateral-orange Remaining margin blue  Skin ellipse -18.5 x 12.7 cm wrinkled tan skin, focally tinged blue and marked with M and L  Nipple/ areola: nipple 1.1 x 3.3 cm and areola 4.3 x 3.1 cm Axillary tail: absent  Biopsy site(s): 3 masses, 1 at 45:36 metallic clip, the second at approximately 2:00 and third approximately 3:00  Discrete Mass(es):  present  Number of Discrete Masses: 3 I-68:03 with metallic clip O-1:22 Q-8:25   Location of mass(es):    see above  Distance between masses: A-B-3.4 cm A-C-3.6 cm B-C-1.5 cm  Size                          of mass(es)/biopsy site: A-1.2 x 1.2 x 1.0 cm, B-0.9 x 0.8 x 0.6 cm and C - 0.9 x 0.8 x 0.8 cm  Description of mass/biopsy site: all 3 are firm and ill-defined tan  Distance of mass/biopsy site from surgical margins: closest is deep to all 3, A-2.9 cm, B-2.8 cm and C-3.0 cm  Gross involvement of skin/fascia/muscle by tumor: not grossly identified  Description of remaining breast: yellow lobulated fibrofatty with multiple fibrous areas  Other remarkable features: none noted  Block Summary: 1-perpendicular  closest deep 2-3-representative mass A (2 with clip) 4-representative tissue between a and B 5-6-representative mass B 7-representative tissue between masses B and C 8-9-representative mass C 10-possible intraparenchymal lymph node 11-representative upper outer quadrant 12-representative lower outer quadrant 13-representative upper inner quadrant 14-representative lower inner quadrant 15-representative nipple and areola  C. Labeled: axillary fat Tissue Fra                         gment(s):  1 Measurement: 4.2 x 3.0 x 1.0 cm Comment: fragment of yellow lobulated fibrofatty tissue, dissected to reveal a 1.5 x 0.6 x 0.8 cm lymph node candidate which is bisected and multiple possible candidates   Entirely submitted in cassette(s): 1-bisected large candidate 2-3 smaller lymph nodes         Final Diagnosis performed by Quay Burow, MD.  Electronically signed 08/20/2015 2:54:51PM    The electronic signature indicates that the named Attending Pathologist has evaluated the specimen  Technical component performed at [facility], [address], [place], [place] [address] Lab: [phone] Dir: Darrick Penna. Evette Doffing, MD  Professional component performed at [facility], [facility], [place], [place], [place] [address] Lab: [phone] Dir: Dellia Nims. Rubinas, MD          ASSESSMENT:  Carcinoma of left breast status post mastectomy Multiple tumors Sentinel lymph nodes are negative    Case will be discussed in tumor conference Proceed with mammogram.  On the largest lobular cancer and small invasive ductal cancer Depending on the results for further planning of therapy with an type hormone therapy plus minus chemotherapy will be discussed Patient does not need any local therapy in form of radiation treatment because had mastectomy done.  Reevaluate patient after mammogram results are available We have estrogen progesterone receptors study on lobular cancer as well as invasive ductal cancer.  I do not believe any further assessment of estrogen progesterone receptor in other 2 small lobular cancer would be useful   Patient expressed understanding and was in agreement with this plan. She also understands that She can call clinic at any time with any questions, concerns, or complaints.    Breast cancer, stage 1   Staging form: Breast, AJCC 7th Edition     Clinical: Stage IA (T1c(m), N0, M0) - Marni Griffon, MD   08/30/2015 4:26  PM

## 2015-09-02 ENCOUNTER — Ambulatory Visit (INDEPENDENT_AMBULATORY_CARE_PROVIDER_SITE_OTHER): Payer: BLUE CROSS/BLUE SHIELD | Admitting: General Surgery

## 2015-09-02 ENCOUNTER — Encounter: Payer: Self-pay | Admitting: General Surgery

## 2015-09-02 VITALS — BP 140/62 | HR 90 | Resp 14 | Ht 63.0 in | Wt 174.0 lb

## 2015-09-02 DIAGNOSIS — C50912 Malignant neoplasm of unspecified site of left female breast: Secondary | ICD-10-CM

## 2015-09-02 NOTE — Patient Instructions (Addendum)
The patient is aware to call back for any questions or concerns. Follow up in one week.

## 2015-09-02 NOTE — Progress Notes (Signed)
Patient ID: Heather Burke, female   DOB: May 10, 1951, 64 y.o.   MRN: [id number]  Chief Complaint  Patient presents with  . Routine Post Op    HPI Heather Burke is a 64 y.o. female.  Here today for postoperative visit, left simple mastectomy on 08-18-15. States she is doing well. She does admit to some "allergy" symptoms. Drain sheet present.  HPI  Past Medical History  Diagnosis Date  . Hypertension   . Hyperlipidemia   . Thyroid disease   . Cancer 06-08-15    invasive breast, ER+,PR+, Her 2 neg (FISH neg) x2.  Marland Kitchen Hypothyroidism   . GERD (gastroesophageal reflux disease)   . PONV (postoperative nausea and vomiting)     with Hysterectomy  . Heart murmur 2016    newly diagnosed, slight murmur    Past Surgical History  Procedure Laterality Date  . Breast biopsy Right 2002    negative/ Dr. Bary Castilla  . Core biopsy Left 06/08/15  . Abdominal hysterectomy    . Tonsillectomy    . Breast biopsy Left 06-08-15    INVASIVE MAMMARY CARCINOMA WITH PARTIAL SOLID PATTERN.   . Gum surgery  2006  . Simple mastectomy with axillary sentinel node biopsy Left 08/18/2015    Procedure: SIMPLE MASTECTOMY;  Surgeon: [name], MD;  Location: [facility] ORS;  Service: General;  Laterality: Left;  . Sentinel node biopsy Left 08/18/2015    Procedure: SENTINEL NODE BIOPSY;  Surgeon: [name], MD;  Location: [facility] ORS;  Service: General;  Laterality: Left;    Family History  Problem Relation Age of Onset  . Cancer Father     prostate    Social History Social History  Substance Use Topics  . Smoking status: Current Every Day Smoker -- 2.00 packs/day for 44 years    Types: Cigarettes  . Smokeless tobacco: Never Used  . Alcohol Use: 0.0 oz/week    0 Standard drinks or equivalent per week     Comment: 3-4 beers/day    No Known Allergies  Current Outpatient Prescriptions  Medication Sig Dispense Refill  . aspirin 81 MG tablet Take 81 mg by mouth daily.    . calcium carbonate  (OS-CAL) 600 MG TABS tablet Take 600 mg by mouth daily.     . Cholecalciferol (D3 ADULT PO) Take 1 tablet by mouth daily.     Marland Kitchen FIBER PO Take by mouth.    . fluticasone (FLONASE) 50 MCG/ACT nasal spray   5  . losartan-hydrochlorothiazide (HYZAAR) 100-25 MG per tablet Take 1 tablet by mouth daily.    Marland Kitchen lovastatin (MEVACOR) 20 MG tablet Take 20 mg by mouth at bedtime.    . Omega-3 Fatty Acids (FISH OIL) 1200 MG CAPS Take by mouth daily.    Marland Kitchen SYNTHROID 88 MCG tablet TAKE 1 TABLET EVERY DAY --- (BRAND NAME ONLY)  6   No current facility-administered medications for this visit.    Review of Systems Review of Systems  Constitutional: Negative.   Respiratory: Negative.   Cardiovascular: Negative.     Blood pressure 140/62, pulse 90, resp. rate 14, height 5\' 3"  (1.6 m), weight 174 lb (78.926 kg).  Physical Exam Physical Exam  Constitutional: She is oriented to person, place, and time. She appears well-developed and well-nourished.  Pulmonary/Chest:    Incision healing well left mastectomy site. Mild swelling noted. Drain removed.  Musculoskeletal:  Excellent upper arm range of motion.  Neurological: She is alert and oriented to person, place, and time.  Skin: Skin is warm and dry.  Psychiatric: Her behavior is normal.    Data Reviewed Drain record shows study decrease, now less than 1 ounce per day.  Assessment    Doing well status post mastectomy.  Mammoprint testing pending.    Plan    Drain was removed without incident. She was advised she may develop recurrent fluid accumulation and this should not alarm her. She'll call if she symptomatic.    Breast prothesis prescription. Follow up in one week. The patient is aware to call back for any questions or concerns.   PCP:  Etheleen Mayhew 09/03/2015, 8:03 AM

## 2015-09-03 ENCOUNTER — Other Ambulatory Visit: Payer: Self-pay

## 2015-09-03 DIAGNOSIS — J309 Allergic rhinitis, unspecified: Secondary | ICD-10-CM

## 2015-09-03 MED ORDER — FLUTICASONE PROPIONATE 50 MCG/ACT NA SUSP: 2.0000 | Freq: Every day | NASAL | Status: DC

## 2015-09-08 ENCOUNTER — Encounter: Payer: Self-pay | Admitting: Oncology

## 2015-09-08 ENCOUNTER — Other Ambulatory Visit: Payer: Self-pay

## 2015-09-08 DIAGNOSIS — J309 Allergic rhinitis, unspecified: Secondary | ICD-10-CM

## 2015-09-08 MED ORDER — FLUTICASONE PROPIONATE 50 MCG/ACT NA SUSP: 2.0000 | Freq: Every day | NASAL | Status: DC

## 2015-09-09 ENCOUNTER — Encounter: Payer: Self-pay | Admitting: Oncology

## 2015-09-09 ENCOUNTER — Inpatient Hospital Stay (HOSPITAL_BASED_OUTPATIENT_CLINIC_OR_DEPARTMENT_OTHER): Payer: BLUE CROSS/BLUE SHIELD | Admitting: Oncology

## 2015-09-09 ENCOUNTER — Ambulatory Visit: Payer: BLUE CROSS/BLUE SHIELD

## 2015-09-09 VITALS — BP 182/91 | HR 71 | Temp 97.5°F | Wt 176.2 lb

## 2015-09-09 DIAGNOSIS — C50412 Malignant neoplasm of upper-outer quadrant of left female breast: Secondary | ICD-10-CM | POA: Diagnosis not present

## 2015-09-09 DIAGNOSIS — Z9012 Acquired absence of left breast and nipple: Secondary | ICD-10-CM

## 2015-09-09 DIAGNOSIS — R011 Cardiac murmur, unspecified: Secondary | ICD-10-CM

## 2015-09-09 DIAGNOSIS — I10 Essential (primary) hypertension: Secondary | ICD-10-CM

## 2015-09-09 DIAGNOSIS — Z170 Estrogen receptor positive status [ER+]: Secondary | ICD-10-CM | POA: Diagnosis not present

## 2015-09-09 DIAGNOSIS — Z79899 Other long term (current) drug therapy: Secondary | ICD-10-CM

## 2015-09-09 DIAGNOSIS — E039 Hypothyroidism, unspecified: Secondary | ICD-10-CM

## 2015-09-09 DIAGNOSIS — K219 Gastro-esophageal reflux disease without esophagitis: Secondary | ICD-10-CM

## 2015-09-09 DIAGNOSIS — Z7982 Long term (current) use of aspirin: Secondary | ICD-10-CM

## 2015-09-09 DIAGNOSIS — E785 Hyperlipidemia, unspecified: Secondary | ICD-10-CM

## 2015-09-09 DIAGNOSIS — Z79811 Long term (current) use of aromatase inhibitors: Secondary | ICD-10-CM | POA: Diagnosis not present

## 2015-09-09 DIAGNOSIS — C50912 Malignant neoplasm of unspecified site of left female breast: Secondary | ICD-10-CM

## 2015-09-09 DIAGNOSIS — F17210 Nicotine dependence, cigarettes, uncomplicated: Secondary | ICD-10-CM

## 2015-09-09 MED ORDER — LETROZOLE 2.5 MG PO TABS: 2.5000 mg | ORAL_TABLET | Freq: Every day | ORAL | Status: DC

## 2015-09-09 MED ORDER — CALCIUM CARBONATE 600 MG PO TABS: 600.0000 mg | ORAL_TABLET | Freq: Two times a day (BID) | ORAL | Status: DC

## 2015-09-09 MED ORDER — INFLUENZA VAC SPLIT QUAD 0.5 ML IM SUSY
0.5000 mL | PREFILLED_SYRINGE | Freq: Once | INTRAMUSCULAR | Status: AC
  Administered 2015-09-09: 0.5 mL via INTRAMUSCULAR

## 2015-09-09 NOTE — Progress Notes (Signed)
[place] @ [facility] Telephone:([phone]) [phone]  Fax:([phone]) [place]: Mar 03, 1951  MR#: [id number]  FEO#:[id number]  Patient Care Team: [name], MD as PCP - General (Family Medicine) [name], MD (Family Medicine) [name], MD (General Surgery)  CHIEF COMPLAINT:  Chief Complaint  Patient presents with  . Results   1.  Carcinoma of breast (left) status post mastectomy in August of 2016 Multiple tumors.  Lobular cancer 18 mm, 9 mm, 0.9 mm ,  One  invasive mammary carcinoma 5 mm Estrogen receptor positive progesterone receptor positive HER-2 receptor negative. MamMO  print shows low risk of recurrent disease MaMO print assays shows no   significant benefit from chemotherapy (September, 2016) Patient started on letrozole (September, 2016)   VISIT DIAGNOSIS:  Cancer of left breast    No history exists.    Oncology Flowsheet 08/18/2015  dexamethasone (DECADRON) IJ -  ondansetron (ZOFRAN) IV -    INTERVAL HISTORY:  [age]-year-old lady with abnormal mammogram and underwent further evaluation .  Most recent ultrasound and core biopsy was done on June of 2016 showing invasive breast cancer.  Patient does confirm regular self breast examination and CT did not feel any palpable mass Patient did have regular mammogram in the past. Patient underwent evaluation followed by mastectomy in multiple tumors were found.  The largest one was 18 mm.  Lobular carcinoma There was one small invasive ductal carcinoma . Estrogen receptor positive progesterone receptor positive HER-2 receptor negative Patient is here for further systemic and local adjuvant therapy  September 09, 2015 Patient is here to discuss the results of the multi gene analysis and planning for further therapy. Chest port is healing well. REVIEW OF SYSTEMS:   GENERAL:  Feels good.  Active.  No fevers, sweats or weight loss. PERFORMANCE STATUS (ECOG):  0 HEENT:  No  visual changes, runny nose, sore throat, mouth sores or tenderness. Lungs: No shortness of breath or cough.  No hemoptysis. Cardiac:  No chest pain, palpitations, orthopnea, or PND. GI:  No nausea, vomiting, diarrhea, constipation, melena or hematochezia. GU:  No urgency, frequency, dysuria, or hematuria. Musculoskeletal:  No back pain.  No joint pain.  No muscle tenderness. Extremities:  No pain or swelling. Skin:  No rashes or skin changes. Neuro:  No headache, numbness or weakness, balance or coordination issues. Endocrine:  No diabetes, thyroid issues, hot flashes or night sweats. Psych:  No mood changes, depression or anxiety. Pain:  No focal pain. Review of systems:  All other systems reviewed and found to be negative.  As per HPI. Otherwise, a complete review of systems is negatve.  PAST MEDICAL HISTORY: Past Medical History  Diagnosis Date  . Hypertension   . Hyperlipidemia   . Thyroid disease   . Cancer 06-08-15    invasive breast, ER+,PR+, Her 2 neg (FISH neg) x2.  Marland Kitchen Hypothyroidism   . GERD (gastroesophageal reflux disease)   . PONV (postoperative nausea and vomiting)     with Hysterectomy  . Heart murmur 2016    newly diagnosed, slight murmur    PAST SURGICAL HISTORY: Past Surgical History  Procedure Laterality Date  . Breast biopsy Right 2002    negative/ Dr. Bary Castilla  . Core biopsy Left 06/08/15  . Abdominal hysterectomy    . Tonsillectomy    . Breast biopsy Left 06-08-15    INVASIVE MAMMARY CARCINOMA WITH PARTIAL SOLID PATTERN.   . Gum surgery  2006  .  Simple mastectomy with axillary sentinel node biopsy Left 08/18/2015    Procedure: SIMPLE MASTECTOMY;  Surgeon: [name], MD;  Location: [facility] ORS;  Service: General;  Laterality: Left;  . Sentinel node biopsy Left 08/18/2015    Procedure: SENTINEL NODE BIOPSY;  Surgeon: [name], MD;  Location: [facility] ORS;  Service: General;  Laterality: Left;    FAMILY HISTORY Family History  Problem Relation  Age of Onset  . Cancer Father     prostate    GYNECOLOGIC HISTORY:  No LMP recorded. Patient has had a hysterectomy.     ADVANCED DIRECTIVES:  Patient does not have any living will or healthcare power of attorney.  Information was given .  Available resources had been discussed.  We will follow-up on subsequent appointments regarding this issue  HEALTH MAINTENANCE: Social History  Substance Use Topics  . Smoking status: Current Every Day Smoker -- 2.00 packs/day for 44 years    Types: Cigarettes  . Smokeless tobacco: Never Used  . Alcohol Use: 0.0 oz/week    0 Standard drinks or equivalent per week     Comment: 3-4 beers/day       No Known Allergies  Current Outpatient Prescriptions  Medication Sig Dispense Refill  . aspirin 81 MG tablet Take 81 mg by mouth daily.    . calcium carbonate (OS-CAL) 600 MG TABS tablet Take 600 mg by mouth daily.     . Cholecalciferol (D3 ADULT PO) Take 1 tablet by mouth daily.     Marland Kitchen FIBER PO Take by mouth.    . fluticasone (FLONASE) 50 MCG/ACT nasal spray Place 2 sprays into both nostrils daily. 48 g 3  . losartan-hydrochlorothiazide (HYZAAR) 100-25 MG per tablet Take 1 tablet by mouth daily.    Marland Kitchen lovastatin (MEVACOR) 20 MG tablet Take 20 mg by mouth at bedtime.    . Omega-3 Fatty Acids (FISH OIL) 1200 MG CAPS Take by mouth daily.    Marland Kitchen SYNTHROID 88 MCG tablet TAKE 1 TABLET EVERY DAY --- (BRAND NAME ONLY)  6   No current facility-administered medications for this visit.    OBJECTIVE: PHYSICAL EXAM: GENERAL:  Well developed, well nourished, sitting comfortably in the exam room in no acute distress. MENTAL STATUS:  Alert and oriented to person, place and time. .  RESPIRATORY:  Clear to auscultation without rales, wheezes or rhonchi. CARDIOVASCULAR:  Regular rate and rhythm without murmur, rub or gallop. BREAST:  Right breast without masses, skin changes or nipple discharge.  Left  Breast ; mastectomy ABDOMEN:  Soft, non-tender, with active  bowel sounds, and no hepatosplenomegaly.  No masses. BACK:  No CVA tenderness.  No tenderness on percussion of the back or rib cage. SKIN:  No rashes, ulcers or lesions. EXTREMITIES: No edema, no skin discoloration or tenderness.  No palpable cords. LYMPH NODES: No palpable cervical, supraclavicular, axillary or inguinal adenopathy  NEUROLOGICAL: Unremarkable. PSYCH:  Appropriate.  Filed Vitals:   09/09/15 1151  BP: 182/91  Pulse: 71  Temp: 97.5 F (36.4 C)     Body mass index is 31.23 kg/(m^2).    ECOG FS:0 - Asymptomatic  LAB RESULTS:  No visits with results within 5 Day(s) from this visit. Latest known visit with results is:  Admission on 08/18/2015, Discharged on 08/18/2015  Component Date Value Ref Range Status  . SURGICAL PATHOLOGY 08/18/2015    Final                   Value:Surgical Pathology CASE:  [id number] PATIENT: Heather Burke Surgical Pathology Report     SPECIMEN SUBMITTED: A. Lymph node, sentinel #1 and #2 B. Breast, left C. Lymph nodes, axillary fat  CLINICAL HISTORY: None provided  PRE-OPERATIVE DIAGNOSIS: left breast cancer  POST-OPERATIVE DIAGNOSIS: None provided    DIAGNOSIS: A. LYMPH NODES, SENTINEL #1 AND #2; EXCISION: - TWO LYMPH NODES NEGATIVE FOR MALIGNANCY (0/2).  B. BREAST, LEFT; MASTECTOMY: - THREE SEPARATE FOCI OF INVASIVE LOBULAR CARCINOMA AT 11:30, 2:00, AND RANDOM UPPER INNER QUADRANT LOCATIONS MEASURING 18 MM, 9 MM, AND 0.9 MM RESPECTIVELY. - SINGLE FOCUS OF INVASIVE MAMMARY CARCINOMA WITH DUCTAL CARCINOMA IN SITU AT 3:00 LOCATION MEASURING 5 MM. - LOBULAR CARCINOMA IN SITU AND CALCIFICATIONS ARE ASSOCIATED WITH AND AWAY FROM FOCI OF INVASIVE LOBULAR CARCINOMA. - ONE INTRAMAMMARY LYMPH NODE NEGATIVE FOR MALIGNANCY (0/1). - SEE CANCER SUMMARY BELOW.  Comment: Two sites of the invasive c                         arcinoma were previously biopsied corresponding to the 11:30 and 3:00 locations in the current  specimen. ER, PR, HER2 results from prior biopsy: 11:30 carcinoma: ER positive, PR positive, HER2 FISH not amplified 3:00 carcinoma: ER positive, PR negative, HER2 FISH not amplified  C. LYMPH NODES, AXILLARY FAT; EXCISION: - ONE LYMPH NODE NEGATIVE FOR MALIGNANCY (0/1). - DEEPER SECTIONS EXAMINED.   INVASIVE CARCINOMA OF THE BREAST: Complete Excision SPECIMEN Procedure:     Total mastectomy (including nipple and skin) Lymph Node Sampling:     Sentinel lymph node(s) Axillary dissection (partial or complete dissection) Lymph nodes present within the breast specimen Specimen Laterality:     Left TUMOR Presence of Invasive Carcinoma:    Histologic Type:    Invasive lobular carcinoma Histologic Grade:   Glandular (acinar) / tubular differentiation: Score 3 (< 10% of tumor area forming glandular / tubular structures) Nuclear pleomorphism: Score 1 (nuclei small with little i                         ncrease in size in comparison with normal breast epithelial cells, regular outlines, uniform nuclear chromatin, little variation in size) Mitotic rate:  Score 1 (<=3 mitoses per mm2) Overall grade: Grade 1 (scores of 3, 4 or 5) Ductal Carcinoma In Situ (DCIS):   DCIS is present Tumor Size / Focality:   Tumor size: size of largest invasive carcinoma: Greatest dimension of largest focus of invasion > 1 mm: 46m Tumor focality:     Multiple foci of invasive carcinoma Number of foci:     4 Sizes of individual foci: invasive lobular: 18 mm, 9 mm, and 0.9 mm; invasive mammary NOS: 5 mm Tumor Extent Macroscopic and Microscopic Extent of Tumor Skin:     Skin invasion: Dermis and/or epidermis is negative for invasive carcinoma Skin satellite foci:     Negative for satellite foci Nipple DCIS:   DCIS does not involve the nipple epidermis Skeletal muscle:    Skeletal muscle is free of carcinoma Accessory Tumor Findings Lymph-Vascular Invasion: Not identified Treatment Effect:                           Response to Presurgical (Neoadjuvant) Therapy: No known presurgical therapy MARGINS Invasive Carcinoma: Margins negative for invasive carcinoma Distance from closest margin: 236mClosest Negative Margin(s):   Posterior Ductal Carcinoma In Situ (DCIS):   Margins negative for DCIS (DCIS  present in specimen) Distance of DCIS from closest margin:   53m Closest Negative Margin(s):   Posterior LYMPH NODES Regional Lymph Nodes:    Number of Sentinel Nodes Examined: Specify number 2 Number of lymph node(s) examined:  Number 4 Lymph node involvement:  All lymph nodes are negative STAGE (pTNM) TNM Descriptors:    m (multiple foci of invasive carcinoma) Primary Tumor (Invasive Carcinoma) (pT):     pT1c:  tumor > 10 mm but <= 20 mm in greatest dimension Regional lymph nodes (pN) (choose a category based on lymph nodes received with the specimen;  immunohistochemistry and / or molecular studies are not required) Category (pN): pN0: no regional lymph node metastasis identif                         ied histologically Distant Metastasis (pM): Not applicable - pM cannot be determined from the submitted specimen(s)   Comment: Findings were communicated to Dr. BBary Castillaon 08/20/2015.   GROSS DESCRIPTION:  A. Intraoperative Consultation:     Received:  fresh     Specimen: sentinel node #1 and #2     Pathologic Evaluation: frozen section     Diagnosis: FS A1 and A2-2 lymph nodes negative for macro metastasis     Communicated to: Dr. BBary Castillaat 10:54 AM on 08/18/2015     Tissue submitted: 1 and 2  A.  Labeled: sentinel node #1 #2 Tissue Fragment(s): 1 Measurement: 3.5 x 2.5 x 0.6 cm Comment: fragment of yellow lobulated fibrofatty tissue, dissected to reveal 2 lymph node candidates, the first 2.5 x 0.8 x 0.6 cm and the second 1.2 x 0.6 x 0.6 cm, first lymph node candidate is trisected in the second lymph node candidate submitted intact  Entirely submitted in cassette(s): 1-2  sections of largest lymph node candidate frozen section remnants 2-remaining section o                         f largest lymph node and smaller lymph node candidate frozen section remnants  B. Labeled: left breast  Time in Fixative: collected at 11 AM and placed in formalin at 1106 on 08/18/2015 Total formalin fixation time 9 hours  Cold Ischemic Time: 6 minutes  Type of mastectomy: simple  Weight of Specimen: 990 grams  Size of specimen: 22 x 19.1 x 3.8 cm  Orientation of specimen: skin ellipse marked with an M and L Deep-black Lateral-orange Remaining margin blue  Skin ellipse -18.5 x 12.7 cm wrinkled tan skin, focally tinged blue and marked with M and L  Nipple/ areola: nipple 1.1 x 3.3 cm and areola 4.3 x 3.1 cm Axillary tail: absent  Biopsy site(s): 3 masses, 1 at 124:26metallic clip, the second at approximately 2:00 and third approximately 3:00  Discrete Mass(es):  present  Number of Discrete Masses: 3 AS-34:19with metallic clip BQ-2:22CL-7:98  Location of mass(es):    see above  Distance between masses: A-B-3.4 cm A-C-3.6 cm B-C-1.5 cm  Size                          of mass(es)/biopsy site: A-1.2 x 1.2 x 1.0 cm, B-0.9 x 0.8 x 0.6 cm and C - 0.9 x 0.8 x 0.8 cm  Description of mass/biopsy site: all 3 are firm and ill-defined tan  Distance of mass/biopsy site from surgical margins: closest is deep to all 3, A-2.9  cm, B-2.8 cm and C-3.0 cm  Gross involvement of skin/fascia/muscle by tumor: not grossly identified  Description of remaining breast: yellow lobulated fibrofatty with multiple fibrous areas  Other remarkable features: none noted  Block Summary: 1-perpendicular closest deep 2-3-representative mass A (2 with clip) 4-representative tissue between a and B 5-6-representative mass B 7-representative tissue between masses B and C 8-9-representative mass C 10-possible intraparenchymal lymph node 11-representative upper outer  quadrant 12-representative lower outer quadrant 13-representative upper inner quadrant 14-representative lower inner quadrant 15-representative nipple and areola  C. Labeled: axillary fat Tissue Fra                         gment(s): 1 Measurement: 4.2 x 3.0 x 1.0 cm Comment: fragment of yellow lobulated fibrofatty tissue, dissected to reveal a 1.5 x 0.6 x 0.8 cm lymph node candidate which is bisected and multiple possible candidates   Entirely submitted in cassette(s): 1-bisected large candidate 2-3 smaller lymph nodes         Final Diagnosis performed by Quay Burow, MD.  Electronically signed 08/20/2015 2:54:51PM    The electronic signature indicates that the named Attending Pathologist has evaluated the specimen  Technical component performed at [facility], [address] [place], [place] [address] Lab: [phone] Dir: Darrick Penna. Evette Doffing, MD  Professional component performed at [facility], [facility], [address], [place], [place] [address] Lab: [phone] Dir: Dellia Nims. Rubinas, MD          ASSESSMENT:  Carcinoma of left breast status post mastectomy Multiple tumors Sentinel lymph nodes are negative  Case was discussed in tumor conference. Mammo print results shows low risk and no benefit from chemotherapy I discussed these findings in detail with the patient Will start patient on letrozole.  Calcium and vitamin D Baseline bone density study has been ordered Risk and benefits including increased chance of osteoporosis has been discussed Patient and number of questions which were answered Return clinic appointment in 6 month She does not need any radiation therapy as she had mastectomy done Total duration of visit was 30 minutes.  50% or more time was spent in counseling patient and family regarding prognosis and options of treatment and available resources Discussion was mainly about pros and: Soft anti-hormonal therapy side effect  like bony pains and joint pains and osteoporosis. Need for regular bone density.  Patient expressed understanding and was in agreement with this plan. She also understands that She can call clinic at any time with any questions, concerns, or complaints.    Breast cancer, stage 1   Staging form: Breast, AJCC 7th Edition     Clinical: Stage IA (T1c(m), N0, M0) - Marni Griffon, MD   09/09/2015 12:26 PM

## 2015-09-09 NOTE — Progress Notes (Signed)
Patient does not have living will.  Currently smokes.  Patient here for genetic testing results.

## 2015-09-09 NOTE — Progress Notes (Signed)
Pt wanted to try 30 day trial of letrozole and then will call back to get 90 day supply to be escribed to local pharmacy.

## 2015-09-10 ENCOUNTER — Ambulatory Visit (INDEPENDENT_AMBULATORY_CARE_PROVIDER_SITE_OTHER): Payer: BLUE CROSS/BLUE SHIELD | Admitting: General Surgery

## 2015-09-10 ENCOUNTER — Encounter: Payer: Self-pay | Admitting: *Deleted

## 2015-09-10 ENCOUNTER — Encounter: Payer: Self-pay | Admitting: General Surgery

## 2015-09-10 VITALS — BP 136/74 | HR 68 | Resp 14 | Ht 63.0 in | Wt 176.0 lb

## 2015-09-10 DIAGNOSIS — C50912 Malignant neoplasm of unspecified site of left female breast: Secondary | ICD-10-CM

## 2015-09-10 NOTE — Progress Notes (Signed)
Patient ID: Heather Burke, female   DOB: 1950-12-31, 64 y.o.   MRN: [id number]  Chief Complaint  Patient presents with  . Routine Post Op    left mastectomy    HPI Heather Burke is a 64 y.o. female here today for her post op left mastectomy done on 08/18/15. Patient states she  Is doing well. Patient does report some skin peeling and "tightness" on her mastectomy insicion. She started taking Femara on 09/09/15.   HPI  Past Medical History  Diagnosis Date  . Hypertension   . Hyperlipidemia   . Thyroid disease   . Cancer 06-08-15    invasive breast, ER+,PR+, Her 2 neg (FISH neg) x2.  Marland Kitchen Hypothyroidism   . GERD (gastroesophageal reflux disease)   . PONV (postoperative nausea and vomiting)     with Hysterectomy  . Heart murmur 2016    newly diagnosed, slight murmur    Past Surgical History  Procedure Laterality Date  . Breast biopsy Right 2002    negative/ Dr. Bary Castilla  . Core biopsy Left 06/08/15  . Abdominal hysterectomy    . Tonsillectomy    . Breast biopsy Left 06-08-15    INVASIVE MAMMARY CARCINOMA WITH PARTIAL SOLID PATTERN.   . Gum surgery  2006  . Simple mastectomy with axillary sentinel node biopsy Left 08/18/2015    Procedure: SIMPLE MASTECTOMY;  Surgeon: [name], MD;  Location: [facility] ORS;  Service: General;  Laterality: Left;  . Sentinel node biopsy Left 08/18/2015    Procedure: SENTINEL NODE BIOPSY;  Surgeon: [name], MD;  Location: [facility] ORS;  Service: General;  Laterality: Left;    Family History  Problem Relation Age of Onset  . Cancer Father     prostate    Social History Social History  Substance Use Topics  . Smoking status: Current Every Day Smoker -- 2.00 packs/day for 44 years    Types: Cigarettes  . Smokeless tobacco: Never Used  . Alcohol Use: 0.0 oz/week    0 Standard drinks or equivalent per week     Comment: 3-4 beers/day    No Known Allergies  Current Outpatient Prescriptions  Medication Sig Dispense Refill  .  aspirin 81 MG tablet Take 81 mg by mouth daily.    . calcium carbonate (OS-CAL) 600 MG TABS tablet Take 1 tablet (600 mg total) by mouth 2 (two) times daily with a meal. 60 tablet   . Cholecalciferol (D3 ADULT PO) Take 1 tablet by mouth daily.     Marland Kitchen FIBER PO Take by mouth.    . fluticasone (FLONASE) 50 MCG/ACT nasal spray Place 2 sprays into both nostrils daily. 48 g 3  . letrozole (FEMARA) 2.5 MG tablet Take 1 tablet (2.5 mg total) by mouth daily. 30 tablet 0  . losartan-hydrochlorothiazide (HYZAAR) 100-25 MG per tablet Take 1 tablet by mouth daily.    Marland Kitchen lovastatin (MEVACOR) 20 MG tablet Take 20 mg by mouth at bedtime.    . Omega-3 Fatty Acids (FISH OIL) 1200 MG CAPS Take by mouth daily.    Marland Kitchen SYNTHROID 88 MCG tablet TAKE 1 TABLET EVERY DAY --- (BRAND NAME ONLY)  6   No current facility-administered medications for this visit.    Review of Systems Review of Systems  Constitutional: Negative.   Respiratory: Negative.   Cardiovascular: Negative.     Blood pressure 136/74, pulse 68, resp. rate 14, height 5\' 3"  (1.6 m), weight 176 lb (79.833 kg).  Physical Exam Physical Exam  Constitutional: She is oriented to person, place, and time. She appears well-developed and well-nourished.  Eyes: No scleral icterus.  Neck: Neck supple.  Pulmonary/Chest:  Well healed insicion, minimal swelling  Lymphadenopathy:    She has no cervical adenopathy.  Neurological: She is alert and oriented to person, place, and time.  Skin: Skin is warm and dry.  Psychiatric: Her behavior is normal.    Data Reviewed Review of medical oncology notes from 09/09/2015 reports a low Mammoprint risk score.  Assessment    T1c carcinoma the left breast, doing well status post mastectomy.     Plan    The patient will increase her activity as she is comfortable.     Encouraged to use heat to reduce swelling.  Follow up in one month.   PCP:  Wynn Banker 09/11/2015, 9:19 PM

## 2015-09-10 NOTE — Progress Notes (Signed)
  Oncology Nurse Navigator Documentation  Referral date to RadOnc/MedOnc: 09/10/15 (09/10/15 1400) Navigator Encounter Type: Telephone (09/10/15 1400) Patient Visit Type: Follow-up (09/10/15 1400)   Barriers/Navigation Needs: No barriers at this time (09/10/15 1400)    Patient started on her antihormonal therapy today.  Mammoprint with low risk.  No chemo needed.  She is to call if she has any questions or needs.            Time Spent with Patient: 15 (09/10/15 1400)

## 2015-09-16 ENCOUNTER — Ambulatory Visit: Payer: BLUE CROSS/BLUE SHIELD | Attending: Oncology

## 2015-09-29 ENCOUNTER — Ambulatory Visit (INDEPENDENT_AMBULATORY_CARE_PROVIDER_SITE_OTHER): Payer: BLUE CROSS/BLUE SHIELD | Admitting: Family Medicine

## 2015-09-29 ENCOUNTER — Encounter: Payer: Self-pay | Admitting: Family Medicine

## 2015-09-29 VITALS — BP 144/76 | HR 80 | Temp 98.5°F | Resp 16 | Ht 63.0 in | Wt 175.0 lb

## 2015-09-29 DIAGNOSIS — E1129 Type 2 diabetes mellitus with other diabetic kidney complication: Secondary | ICD-10-CM

## 2015-09-29 DIAGNOSIS — E785 Hyperlipidemia, unspecified: Secondary | ICD-10-CM | POA: Insufficient documentation

## 2015-09-29 DIAGNOSIS — I10 Essential (primary) hypertension: Secondary | ICD-10-CM | POA: Diagnosis not present

## 2015-09-29 DIAGNOSIS — E039 Hypothyroidism, unspecified: Secondary | ICD-10-CM | POA: Diagnosis not present

## 2015-09-29 DIAGNOSIS — R011 Cardiac murmur, unspecified: Secondary | ICD-10-CM | POA: Diagnosis not present

## 2015-09-29 LAB — POCT GLYCOSYLATED HEMOGLOBIN (HGB A1C): Hemoglobin A1C: 6.2

## 2015-09-29 LAB — POCT UA - MICROALBUMIN: Microalbumin Ur, POC: NEGATIVE mg/L

## 2015-09-29 MED ORDER — LOVASTATIN 20 MG PO TABS: 20.0000 mg | ORAL_TABLET | Freq: Every day | ORAL | Status: DC

## 2015-09-29 MED ORDER — LOSARTAN POTASSIUM-HCTZ 100-25 MG PO TABS: 1.0000 | ORAL_TABLET | Freq: Every day | ORAL | Status: DC

## 2015-09-29 MED ORDER — SYNTHROID 88 MCG PO TABS: 88.0000 ug | ORAL_TABLET | Freq: Every day | ORAL | Status: DC

## 2015-09-29 NOTE — Progress Notes (Signed)
Subjective:    Patient ID: Heather Burke, female    DOB: December 23, 1950, 64 y.o.   MRN: [id number]  Diabetes She presents for her follow-up diabetic visit. She has type 2 diabetes mellitus. Her disease course has been stable. There are no hypoglycemic associated symptoms. Pertinent negatives for hypoglycemia include no dizziness, headaches or sweats. Associated symptoms include fatigue (Still a little tired from the mastoectomy, but is improving.). Pertinent negatives for diabetes include no blurred vision, no chest pain, no foot paresthesias, no foot ulcerations, no polydipsia, no polyphagia and no polyuria. Symptoms are stable. Risk factors for coronary artery disease include diabetes mellitus, hypertension and dyslipidemia. Current diabetic treatment includes diet. Her weight is stable. There is no change in her home blood glucose trend. She does not see a podiatrist.Eye exam is not current (Plannig to go soon. ).  Hypertension This is a chronic problem. The problem is unchanged. The problem is controlled. Associated symptoms include malaise/fatigue. Pertinent negatives include no blurred vision, chest pain, headaches, neck pain, palpitations, peripheral edema, PND, shortness of breath or sweats. Risk factors for coronary artery disease include diabetes mellitus and dyslipidemia. The current treatment provides mild (Has been up and down over past few months. ) improvement. There are no compliance problems.   Hyperlipidemia This is a chronic problem. The problem is controlled. Pertinent negatives include no chest pain, focal sensory loss, focal weakness, leg pain, myalgias or shortness of breath. Current antihyperlipidemic treatment includes statins. There are no compliance problems.  Risk factors for coronary artery disease include diabetes mellitus, dyslipidemia and hypertension.   Under a lot of stress recently. Has had multiple family crisis going on.   Patient Active Problem List   Diagnosis  Date Noted  . Hypothyroidism 09/29/2015  . Allergic rhinitis 08/27/2015  . Anxiety 08/27/2015  . Screening breast examination 08/27/2015  . Type 2 diabetes mellitus with other diabetic kidney complication ([place]) 76/16/0737  . Avitaminosis D 08/27/2015  . Compulsive tobacco user syndrome 08/27/2015  . Hypertension 07/01/2015  . Breast cancer, stage 1 ([place]) 06/17/2015   Family History  Problem Relation Age of Onset  . Cancer Father     prostate  . Thyroid disease Daughter   . Heart attack Sister    Social History   Social History  . Marital Status: Widowed    Spouse Name: N/A  . Number of Children: 2  . Years of Education: H/S   Occupational History  . Part-Time    Social History Main Topics  . Smoking status: Current Every Day Smoker -- 2.00 packs/day for 44 years    Types: Cigarettes  . Smokeless tobacco: Never Used  . Alcohol Use: 0.0 oz/week    0 Standard drinks or equivalent per week     Comment: 3-4 beers/day  . Drug Use: No  . Sexual Activity: Not on file   Other Topics Concern  . Not on file   Social History Narrative   Past Surgical History  Procedure Laterality Date  . Breast biopsy Right 2002    negative/ Dr. Bary Castilla  . Core biopsy Left 06/08/15  . Abdominal hysterectomy    . Tonsillectomy    . Breast biopsy Left 06-08-15    INVASIVE MAMMARY CARCINOMA WITH PARTIAL SOLID PATTERN.   . Gum surgery  2006  . Simple mastectomy with axillary sentinel node biopsy Left 08/18/2015    Procedure: SIMPLE MASTECTOMY;  Surgeon: [name], MD;  Location: [facility] ORS;  Service: General;  Laterality: Left;  .  Sentinel node biopsy Left 08/18/2015    Procedure: SENTINEL NODE BIOPSY;  Surgeon: [name], MD;  Location: [facility] ORS;  Service: General;  Laterality: Left;   Allergies  Allergen Reactions  . Lisinopril Cough   Previous Medications   ASPIRIN 81 MG TABLET    Take 81 mg by mouth daily.   CALCIUM CARBONATE (OS-CAL) 600 MG TABS TABLET    Take 1 tablet  (600 mg total) by mouth 2 (two) times daily with a meal.   CHOLECALCIFEROL (D3 ADULT PO)    Take 1 tablet by mouth daily.    FIBER PO    Take by mouth.   FLUTICASONE (FLONASE) 50 MCG/ACT NASAL SPRAY    Place 2 sprays into both nostrils daily.   LETROZOLE (FEMARA) 2.5 MG TABLET    Take 1 tablet (2.5 mg total) by mouth daily.   LOSARTAN-HYDROCHLOROTHIAZIDE (HYZAAR) 100-25 MG PER TABLET    Take 1 tablet by mouth daily.   LOVASTATIN (MEVACOR) 20 MG TABLET    Take 20 mg by mouth at bedtime.   OMEGA-3 FATTY ACIDS (FISH OIL) 1200 MG CAPS    Take by mouth daily.   SYNTHROID 88 MCG TABLET    TAKE 1 TABLET EVERY DAY --- (BRAND NAME ONLY)   BP 144/76 mmHg  Pulse 80  Temp(Src) 98.5 F (36.9 C) (Oral)  Resp 16  Ht 5\' 3"  (1.6 m)  Wt 175 lb (79.379 kg)  BMI 31.01 kg/m2    Review of Systems  Constitutional: Positive for malaise/fatigue and fatigue (Still a little tired from the [place], but is improving.). Negative for fever, chills, diaphoresis, activity change, appetite change and unexpected weight change.  Eyes: Negative for blurred vision.  Respiratory: Negative.  Negative for shortness of breath.   Cardiovascular: Negative.  Negative for chest pain, palpitations and PND.       Dr. Bary Castilla reports hearing a heart Murmur.  Gastrointestinal: Negative.   Endocrine: Positive for cold intolerance and heat intolerance. Negative for polydipsia, polyphagia and polyuria.       Adjusting to Femara.  Musculoskeletal: Negative.  Negative for myalgias and neck pain.  Allergic/Immunologic: Positive for environmental allergies.  Neurological: Negative for dizziness, focal weakness, light-headedness and headaches.  Psychiatric/Behavioral: Negative for sleep disturbance.   \    Objective:   Physical Exam  Constitutional: She is oriented to person, place, and time. She appears well-developed and well-nourished.  Neurological: She is alert and oriented to person, place, and time.  Psychiatric: She has  a normal mood and affect. Her behavior is normal. Judgment and thought content normal.   Diabetic Foot Exam - Simple   Simple Foot Form  Diabetic Foot exam was performed with the following findings:  Yes 09/29/2015  2:06 PM  Visual Inspection  No deformities, no ulcerations, no other skin breakdown bilaterally:  Yes  Sensation Testing  Intact to touch and monofilament testing bilaterally:  Yes  Pulse Check  Posterior Tibialis and Dorsalis pulse intact bilaterally:  Yes  Comments     BP 144/76 mmHg  Pulse 80  Temp(Src) 98.5 F (36.9 C) (Oral)  Resp 16  Ht 5\' 3"  (1.6 m)  Wt 175 lb (79.379 kg)  BMI 31.01 kg/m2     Assessment & Plan:  1. Type 2 diabetes mellitus with other diabetic kidney complication (HCC) Stable.Continue current lifestyle modifications. Recheck in 6 montsh.  - POCT UA - Microalbumin - POCT glycosylated hemoglobin (Hb A1C) Results for orders placed or performed in visit on 09/29/15  POCT UA - Microalbumin  Result Value Ref Range   Microalbumin Ur, POC Negative mg/L  POCT glycosylated hemoglobin (Hb A1C)  Result Value Ref Range   Hemoglobin A1C 6.2     2. Essential hypertension Condition is stable. Please continue current medication and  plan of care as noted.  Up today, but under a lot of stress. Will monitor.  - losartan-hydrochlorothiazide (HYZAAR) 100-25 MG tablet; Take 1 tablet by mouth daily.  Dispense: 90 tablet; Refill: 3  3. Hypothyroidism, unspecified hypothyroidism type Stable. Continue medication.  - SYNTHROID 88 MCG tablet; Take 1 tablet (88 mcg total) by mouth daily before breakfast.  Dispense: 90 tablet; Refill: 3  4. Hyperlipemia Stable.  - lovastatin (MEVACOR) 20 MG tablet; Take 1 tablet (20 mg total) by mouth at bedtime.  Dispense: 90 tablet; Refill: 3  5. Newly recognized murmur Noted by Dr. Bary Castilla. Will schedule echo to assess. - Echocardiogram; Future  [name], MD

## 2015-10-03 ENCOUNTER — Other Ambulatory Visit: Payer: Self-pay | Admitting: Family Medicine

## 2015-10-03 DIAGNOSIS — E785 Hyperlipidemia, unspecified: Secondary | ICD-10-CM

## 2015-10-05 ENCOUNTER — Telehealth: Payer: Self-pay | Admitting: Family Medicine

## 2015-10-05 ENCOUNTER — Telehealth: Payer: Self-pay | Admitting: *Deleted

## 2015-10-05 DIAGNOSIS — C50912 Malignant neoplasm of unspecified site of left female breast: Secondary | ICD-10-CM

## 2015-10-05 MED ORDER — LETROZOLE 2.5 MG PO TABS: 2.5000 mg | ORAL_TABLET | Freq: Every day | ORAL | Status: DC

## 2015-10-05 NOTE — Telephone Encounter (Signed)
Tolerating letrozole well and needs a refill. Escribed

## 2015-10-05 NOTE — Telephone Encounter (Signed)
Pt advised.   Thanks,   -Laura  

## 2015-10-05 NOTE — Telephone Encounter (Signed)
Pt has an appointment this week to get her teeth cleaned and wanted to know with new found heart murmur if this would be ok. Patient states she is not having any type of sedation

## 2015-10-05 NOTE — Telephone Encounter (Signed)
Should not need antibiotic. Most murmurs do not need treatment. Recommendations have changed. Thanks.

## 2015-10-05 NOTE — Telephone Encounter (Signed)
Auth # for echo [id number] expires 11-03-15

## 2015-10-08 ENCOUNTER — Ambulatory Visit: Payer: BLUE CROSS/BLUE SHIELD

## 2015-10-09 ENCOUNTER — Telehealth: Payer: Self-pay

## 2015-10-09 ENCOUNTER — Ambulatory Visit
Admission: RE | Admit: 2015-10-09 | Discharge: 2015-10-09 | Disposition: A | Discharge status: Home / Self Care | Payer: BLUE CROSS/BLUE SHIELD | Source: Ambulatory Visit | Attending: Family Medicine | Admitting: Family Medicine

## 2015-10-09 DIAGNOSIS — R011 Cardiac murmur, unspecified: Secondary | ICD-10-CM

## 2015-10-09 DIAGNOSIS — R012 Other cardiac sounds: Secondary | ICD-10-CM | POA: Insufficient documentation

## 2015-10-09 NOTE — Progress Notes (Signed)
*  PRELIMINARY RESULTS* Echocardiogram 2D Echocardiogram has been performed.  Heather Burke 10/09/2015, 11:42 AM

## 2015-10-09 NOTE — Telephone Encounter (Signed)
Pt advised.   Thanks,   -Jayme Mednick  

## 2015-10-09 NOTE — Telephone Encounter (Signed)
-----   Message from [name], MD sent at 10/09/2015  2:17 PM EDT ----- Essentially normal echo. No valve disease found. Maybe some mild wall thickening, and treatment is to keep blood pressure under good control. Recommend 3 month follow up.  Thanks.

## 2015-10-12 ENCOUNTER — Ambulatory Visit (INDEPENDENT_AMBULATORY_CARE_PROVIDER_SITE_OTHER): Payer: BLUE CROSS/BLUE SHIELD | Admitting: General Surgery

## 2015-10-12 ENCOUNTER — Encounter: Payer: Self-pay | Admitting: General Surgery

## 2015-10-12 VITALS — BP 130/74 | HR 77 | Resp 14 | Ht 63.0 in | Wt 177.8 lb

## 2015-10-12 DIAGNOSIS — C50912 Malignant neoplasm of unspecified site of left female breast: Secondary | ICD-10-CM

## 2015-10-12 NOTE — Progress Notes (Signed)
Patient ID: Heather Burke, female   DOB: May 31, 1951, 64 y.o.   MRN: [id number]  Chief Complaint  Patient presents with  . Routine Post Op    HPI Heather Burke is a 64 y.o. female female here today for her post op left mastectomy done on 08/18/15. Patient states she is doing well. She reports she has had headaches with the Femara. She does report some lingering tenderness in the left chest and under arm area.  HPI  Past Medical History  Diagnosis Date  . Hypertension   . Hyperlipidemia   . Thyroid disease   . Cancer ([place]) 06-08-15    invasive breast, ER+,PR+, Her 2 neg (FISH neg) x2.  Marland Kitchen Hypothyroidism   . GERD (gastroesophageal reflux disease)   . PONV (postoperative nausea and vomiting)     with Hysterectomy  . Heart murmur 2016    newly diagnosed, slight murmur    Past Surgical History  Procedure Laterality Date  . Breast biopsy Right 2002    negative/ Dr. Bary Castilla  . Core biopsy Left 06/08/15  . Abdominal hysterectomy    . Tonsillectomy    . Breast biopsy Left 06-08-15    INVASIVE MAMMARY CARCINOMA WITH PARTIAL SOLID PATTERN.   . Gum surgery  2006  . Simple mastectomy with axillary sentinel node biopsy Left 08/18/2015    Procedure: SIMPLE MASTECTOMY;  Surgeon: [name], MD;  Location: [facility] ORS;  Service: General;  Laterality: Left;  . Sentinel node biopsy Left 08/18/2015    Procedure: SENTINEL NODE BIOPSY;  Surgeon: [name], MD;  Location: [facility] ORS;  Service: General;  Laterality: Left;    Family History  Problem Relation Age of Onset  . Cancer Father     prostate  . Thyroid disease Daughter   . Heart attack Sister     Social History Social History  Substance Use Topics  . Smoking status: Current Every Day Smoker -- 2.00 packs/day for 44 years    Types: Cigarettes  . Smokeless tobacco: Never Used  . Alcohol Use: 0.0 oz/week    0 Standard drinks or equivalent per week     Comment: 3-4 beers/day    Allergies  Allergen Reactions  .  Lisinopril Cough    Current Outpatient Prescriptions  Medication Sig Dispense Refill  . aspirin 81 MG tablet Take 81 mg by mouth daily.    . calcium carbonate (OS-CAL) 600 MG TABS tablet Take 1 tablet (600 mg total) by mouth 2 (two) times daily with a meal. 60 tablet   . Cholecalciferol (D3 ADULT PO) Take 1 tablet by mouth daily.     Marland Kitchen FIBER PO Take by mouth.    . fluticasone (FLONASE) 50 MCG/ACT nasal spray Place 2 sprays into both nostrils daily. 48 g 3  . letrozole (FEMARA) 2.5 MG tablet Take 1 tablet (2.5 mg total) by mouth daily. 30 tablet 2  . losartan-hydrochlorothiazide (HYZAAR) 100-25 MG tablet Take 1 tablet by mouth daily. 90 tablet 3  . lovastatin (MEVACOR) 20 MG tablet TAKE 1 TABLET BY MOUTH EVERY NIGHT AT BEDTIME 30 tablet 5  . Omega-3 Fatty Acids (FISH OIL) 1200 MG CAPS Take by mouth daily.    Marland Kitchen SYNTHROID 88 MCG tablet Take 1 tablet (88 mcg total) by mouth daily before breakfast. 90 tablet 3   No current facility-administered medications for this visit.    Review of Systems Review of Systems  Constitutional: Negative.   Respiratory: Negative.   Cardiovascular: Negative.  Blood pressure 130/74, pulse 77, resp. rate 14, height 5\' 3"  (1.6 m), weight 177 lb 12.8 oz (80.65 kg).  Physical Exam Physical Exam  Constitutional: She is oriented to person, place, and time. She appears well-developed and well-nourished.  Eyes: Conjunctivae are normal. No scleral icterus.  Neck: Neck supple.  Cardiovascular: Normal rate and regular rhythm.   Pulmonary/Chest: Effort normal and breath sounds normal.  Well healed left mastectomy site.   Lymphadenopathy:    She has no cervical adenopathy.  Neurological: She is alert and oriented to person, place, and time.  Skin: Skin is warm and dry.  Psychiatric: She has a normal mood and affect.    Data Reviewed 09/09/2015 medical oncology note from Blain Pais, M.D.  Assessment    Doing well status post mastectomy.    Plan     We'll plan for follow-up examination in 4 months. She may increase her activity as she is comfortable.    PCP: Myles Lipps 10/13/2015, 12:07 PM

## 2015-10-12 NOTE — Patient Instructions (Signed)
Follow up in 4 months 

## 2015-10-29 ENCOUNTER — Encounter: Payer: Self-pay | Admitting: General Surgery

## 2015-12-02 ENCOUNTER — Encounter: Payer: Self-pay | Admitting: *Deleted

## 2015-12-23 ENCOUNTER — Telehealth: Payer: Self-pay | Admitting: *Deleted

## 2015-12-23 NOTE — Telephone Encounter (Signed)
Spoke with Heather Burke. Patient instructed to stop the Femara to see if numbness improved. She was instructed to call our office back in 2 weeks. Explained that if her numbness improves, then Dr. Oliva Bustard will consider starting her on a different AI.  Teach back process performed.

## 2015-12-23 NOTE — Telephone Encounter (Signed)
Pt needs to stop femara and notify our office in 2 weeks if numbness improves. If numbness improves, then MD wishes to start aromasin 25mg  daily.

## 2015-12-23 NOTE — Telephone Encounter (Signed)
Patient taking Femara. Since taking the Femara the patient reports "extreme numbness in hands, which keeps me awake at night." Patient requesting to change a different AI. Please have Dr. Oliva Bustard advise.   She also states that she missed her bone density scan in September. She would like to get this rescheduled.  She states that she can do any morning on Tuesdays, Thursday or Fridays b/w 930am and 1230pm. I asked Scheduling to coordinate this for the patient and call her with a new appointment.

## 2016-01-01 ENCOUNTER — Encounter: Payer: Self-pay | Admitting: Family Medicine

## 2016-01-01 ENCOUNTER — Ambulatory Visit (INDEPENDENT_AMBULATORY_CARE_PROVIDER_SITE_OTHER): Payer: BLUE CROSS/BLUE SHIELD | Admitting: Family Medicine

## 2016-01-01 VITALS — BP 116/84 | HR 80 | Temp 98.4°F | Resp 16 | Wt 179.0 lb

## 2016-01-01 DIAGNOSIS — E785 Hyperlipidemia, unspecified: Secondary | ICD-10-CM | POA: Diagnosis not present

## 2016-01-01 DIAGNOSIS — E1129 Type 2 diabetes mellitus with other diabetic kidney complication: Secondary | ICD-10-CM

## 2016-01-01 DIAGNOSIS — I10 Essential (primary) hypertension: Secondary | ICD-10-CM

## 2016-01-01 LAB — POCT GLYCOSYLATED HEMOGLOBIN (HGB A1C)
ESTIMATED AVERAGE GLUCOSE: 148
Hemoglobin A1C: 6.8

## 2016-01-01 MED ORDER — LOVASTATIN 20 MG PO TABS: 20.0000 mg | ORAL_TABLET | Freq: Every day | ORAL | Status: DC

## 2016-01-01 NOTE — Progress Notes (Signed)
Subjective:    Patient ID: Heather Burke, female    DOB: July 10, 1951, 65 y.o.   MRN: [id number]  HPI Comments: Pt was noted to have a newly recognized murmur on 09/29/2015. Echo was performed on 10/09/2015, which showed no valve disease. Also showed some mild wall thickening. Treatment for this includes well controlled blood pressure.  Hypertension This is a chronic problem. The problem is controlled. Associated symptoms include malaise/fatigue. Pertinent negatives include no anxiety, blurred vision, chest pain, neck pain, orthopnea, palpitations, peripheral edema, shortness of breath or sweats. Risk factors for coronary artery disease include diabetes mellitus, dyslipidemia, post-menopausal state, family history and smoking/tobacco exposure. Treatments tried: Hyzaar 100-25. The current treatment provides moderate improvement. There are no compliance problems.   Diabetes She presents for her follow-up (Last A1C was 09/29/2015 and was 6.2%) diabetic visit. She has type 2 diabetes mellitus. There are no hypoglycemic associated symptoms. Pertinent negatives for hypoglycemia include no sweats. Associated symptoms include fatigue, polyphagia and polyuria (due to BP medication). Pertinent negatives for diabetes include no blurred vision, no chest pain, no foot paresthesias, no foot ulcerations, no polydipsia, no visual change and no weakness. Symptoms are stable. Risk factors for coronary artery disease include diabetes mellitus and dyslipidemia. When asked about current treatments, none were reported. She is compliant with treatment all of the time. She is following a generally healthy diet. She participates in exercise daily (walking as well as keeping up with a 4 YO grandchild that pt watches daily). Home blood sugar record trend: BS not being checked. An ACE inhibitor/angiotensin II receptor blocker is being taken. Eye exam is not current.      Review of Systems  Constitutional: Positive for  malaise/fatigue and fatigue.  Eyes: Negative for blurred vision.  Respiratory: Negative for shortness of breath.   Cardiovascular: Negative for chest pain, palpitations and orthopnea.  Endocrine: Positive for polyphagia and polyuria (due to BP medication). Negative for polydipsia.  Musculoskeletal: Negative for neck pain.  Neurological: Negative for weakness.   BP 116/84 mmHg  Pulse 80  Temp(Src) 98.4 F (36.9 C) (Oral)  Resp 16  Wt 179 lb (81.194 kg)   Patient Active Problem List   Diagnosis Date Noted  . Hypothyroidism 09/29/2015  . Hyperlipemia 09/29/2015  . Newly recognized murmur 09/29/2015  . Allergic rhinitis 08/27/2015  . Anxiety 08/27/2015  . Screening breast examination 08/27/2015  . Type 2 diabetes mellitus with other diabetic kidney complication ([place]) [id number]  . Avitaminosis D 08/27/2015  . Compulsive tobacco user syndrome 08/27/2015  . Hypertension 07/01/2015  . Breast cancer, stage 1 ([place]) 06/17/2015   Past Medical History  Diagnosis Date  . Hypertension   . Hyperlipidemia   . Thyroid disease   . Cancer ([place]) 06-08-15    invasive breast, ER+,PR+, Her 2 neg (FISH neg) x2.  Marland Kitchen Hypothyroidism   . GERD (gastroesophageal reflux disease)   . PONV (postoperative nausea and vomiting)     with Hysterectomy  . Heart murmur 2016    newly diagnosed, slight murmur   Current Outpatient Prescriptions on File Prior to Visit  Medication Sig  . aspirin 81 MG tablet Take 81 mg by mouth daily.  . calcium carbonate (OS-CAL) 600 MG TABS tablet Take 1 tablet (600 mg total) by mouth 2 (two) times daily with a meal.  . Cholecalciferol (D3 ADULT PO) Take 1 tablet by mouth daily.   Marland Kitchen FIBER PO Take by mouth.  . fluticasone (FLONASE) 50 MCG/ACT nasal spray Place 2 sprays into  both nostrils daily.  Marland Kitchen losartan-hydrochlorothiazide (HYZAAR) 100-25 MG tablet Take 1 tablet by mouth daily.  Marland Kitchen lovastatin (MEVACOR) 20 MG tablet TAKE 1 TABLET BY MOUTH EVERY NIGHT AT BEDTIME  . Omega-3  Fatty Acids (FISH OIL) 1200 MG CAPS Take by mouth daily.  Marland Kitchen SYNTHROID 88 MCG tablet Take 1 tablet (88 mcg total) by mouth daily before breakfast.  . letrozole (FEMARA) 2.5 MG tablet Take 1 tablet (2.5 mg total) by mouth daily. (Patient not taking: Reported on 01/01/2016)   No current facility-administered medications on file prior to visit.   Allergies  Allergen Reactions  . Lisinopril Cough   Past Surgical History  Procedure Laterality Date  . Breast biopsy Right 2002    negative/ Dr. Bary Castilla  . Core biopsy Left 06/08/15  . Abdominal hysterectomy    . Tonsillectomy    . Breast biopsy Left 06-08-15    INVASIVE MAMMARY CARCINOMA WITH PARTIAL SOLID PATTERN.   . Gum surgery  2006  . Simple mastectomy with axillary sentinel node biopsy Left 08/18/2015    Procedure: SIMPLE MASTECTOMY;  Surgeon: [name], MD;  Location: [facility] ORS;  Service: General;  Laterality: Left;  . Sentinel node biopsy Left 08/18/2015    Procedure: SENTINEL NODE BIOPSY;  Surgeon: [name], MD;  Location: [facility] ORS;  Service: General;  Laterality: Left;   Social History   Social History  . Marital Status: Widowed    Spouse Name: N/A  . Number of Children: 2  . Years of Education: H/S   Occupational History  . Part-Time    Social History Main Topics  . Smoking status: Current Every Day Smoker -- 2.00 packs/day for 44 years    Types: Cigarettes  . Smokeless tobacco: Never Used  . Alcohol Use: 0.0 oz/week    0 Standard drinks or equivalent per week     Comment: 3-4 beers/day  . Drug Use: No  . Sexual Activity: Not on file   Other Topics Concern  . Not on file   Social History Narrative   Family History  Problem Relation Age of Onset  . Cancer Father     prostate  . Thyroid disease Daughter   . Heart attack Sister       Objective:   Physical Exam  Constitutional: She appears well-developed and well-nourished.  Cardiovascular: Normal rate and regular rhythm.   Pulmonary/Chest:  Effort normal and breath sounds normal.  Neurological:  Monofilament normal.  Psychiatric: She has a normal mood and affect. Her behavior is normal.   BP 116/84 mmHg  Pulse 80  Temp(Src) 98.4 F (36.9 C) (Oral)  Resp 16  Wt 179 lb (81.194 kg)     Assessment & Plan:  1. Essential hypertension Stable. Continue current medications. Continue exercise and work on diet.  2. Type 2 diabetes mellitus with other diabetic kidney complication (HCC) Worsening. A1C is 6.8%. Pt admits she has been struggling with diet due to holidays, snow, and Femara causing pt to be excessively hungry. Dr. Oliva Bustard D/C Femara due to side effects one week ago. Am willing to hold off on medicine for now. FU 3 months. Will reassess need for medication at that time. - POCT glycosylated hemoglobin (Hb A1C)  Results for orders placed or performed in visit on 01/01/16  POCT glycosylated hemoglobin (Hb A1C)  Result Value Ref Range   Hemoglobin A1C 6.8    Est. average glucose Bld gHb Est-mCnc 148    Diabetic Foot Exam - Simple   Simple  Foot Form  Diabetic Foot exam was performed with the following findings:  Yes 01/01/2016 10:12 AM  Visual Inspection  No deformities, no ulcerations, no other skin breakdown bilaterally:  Yes  Sensation Testing  Intact to touch and monofilament testing bilaterally:  Yes  Pulse Check  Posterior Tibialis and Dorsalis pulse intact bilaterally:  Yes  Comments      3. Hyperlipemia Pt requesting refill. Lipids due to be rechecked in May, 2017. - lovastatin (MEVACOR) 20 MG tablet; Take 1 tablet (20 mg total) by mouth at bedtime.  Dispense: 90 tablet; Refill: 1   Patient seen and examined by Jerrell Belfast, MD, and note scribed by Renaldo Fiddler, CMA.  I have reviewed the document for accuracy and completeness and I agree with above. Jerrell Belfast, MD   [name], MD

## 2016-01-05 ENCOUNTER — Encounter: Payer: Self-pay | Admitting: Family Medicine

## 2016-01-05 ENCOUNTER — Ambulatory Visit (INDEPENDENT_AMBULATORY_CARE_PROVIDER_SITE_OTHER): Payer: BLUE CROSS/BLUE SHIELD | Admitting: Family Medicine

## 2016-01-05 VITALS — BP 160/86 | HR 84 | Temp 98.1°F | Resp 16 | Wt 178.0 lb

## 2016-01-05 DIAGNOSIS — K1121 Acute sialoadenitis: Secondary | ICD-10-CM | POA: Diagnosis not present

## 2016-01-05 DIAGNOSIS — Z8719 Personal history of other diseases of the digestive system: Secondary | ICD-10-CM | POA: Insufficient documentation

## 2016-01-05 MED ORDER — PREDNISONE 10 MG PO TABS: ORAL_TABLET | ORAL | Status: DC

## 2016-01-05 MED ORDER — CEFDINIR 300 MG PO CAPS: 300.0000 mg | ORAL_CAPSULE | Freq: Two times a day (BID) | ORAL | Status: DC

## 2016-01-05 NOTE — Progress Notes (Signed)
Subjective:     Patient ID: Heather Burke, female   DOB: 05/11/1951, 65 y.o.   MRN: [id number]  Chief Complaint  Patient presents with  . Facial Swelling    Swelling appeared yesterday on left cheek. Tender. Pt also c/o malaise/fatigue. Afebrile.    HPI  Presents with acute left cheek swelling below the left ear. Face was tingling on Sunday, started swelling yesterday to half the size of her cheek. Has slightly reduced in size since yesterday.  Does have  pain with chewing on the skin, not inside her mouth. Has  fullness in ears, no pain or changes in hearing. Endorses ear infections as an adult though last one was years ago. No erythema, bleeding, crusting, or other changes to overlying skin. Ice and ibuprofen helped reduce swelling yesterday. No fevers, URI sx including cough, congestion, sore throat. Endorses being tired from helping daughter move last weekend. Never had similar symptoms before. No infections in the last few weeks. No travel. No sick contacts.   Stopped taking Femara 1+ week ago for hand swelling, pain, and numbness. Will reassess after d/c Femara for 2 weeks.  Still with joint pain.    Review of Systems  Constitutional: Negative for fever and activity change.  HENT: Positive for facial swelling. Negative for congestion and sinus pressure.        Ear fullness  Eyes: Negative.   Respiratory: Negative.   Cardiovascular: Negative.   Gastrointestinal: Negative.   Musculoskeletal: Negative.   Skin: Negative.     Patient Active Problem List   Diagnosis Date Noted  . Hypothyroidism 09/29/2015  . Hyperlipemia 09/29/2015  . Newly recognized murmur 09/29/2015  . Allergic rhinitis 08/27/2015  . Anxiety 08/27/2015  . Screening breast examination 08/27/2015  . Type 2 diabetes mellitus with other diabetic kidney complication ([place]) [id number]  . Avitaminosis D 08/27/2015  . Compulsive tobacco user syndrome 08/27/2015  . Hypertension 07/01/2015  . Breast cancer, stage 1  (HCC) 06/17/2015   Previous Medications   ASPIRIN 81 MG TABLET    Take 81 mg by mouth daily.   CALCIUM CARBONATE (OS-CAL) 600 MG TABS TABLET    Take 1 tablet (600 mg total) by mouth 2 (two) times daily with a meal.   CHOLECALCIFEROL (D3 ADULT PO)    Take 1 tablet by mouth daily.    FIBER PO    Take by mouth.   FLUTICASONE (FLONASE) 50 MCG/ACT NASAL SPRAY    Place 2 sprays into both nostrils daily.   LETROZOLE (FEMARA) 2.5 MG TABLET    Take 1 tablet (2.5 mg total) by mouth daily.   LOSARTAN-HYDROCHLOROTHIAZIDE (HYZAAR) 100-25 MG TABLET    Take 1 tablet by mouth daily.   LOVASTATIN (MEVACOR) 20 MG TABLET    Take 1 tablet (20 mg total) by mouth at bedtime.   OMEGA-3 FATTY ACIDS (FISH OIL) 1200 MG CAPS    Take by mouth daily.   SYNTHROID 88 MCG TABLET    Take 1 tablet (88 mcg total) by mouth daily before breakfast.   Allergies  Allergen Reactions  . Lisinopril Cough   Past Surgical History  Procedure Laterality Date  . Breast biopsy Right 2002    negative/ Dr. Bary Castilla  . Core biopsy Left 06/08/15  . Abdominal hysterectomy    . Tonsillectomy    . Breast biopsy Left 06-08-15    INVASIVE MAMMARY CARCINOMA WITH PARTIAL SOLID PATTERN.   . Gum surgery  2006  . Simple mastectomy with axillary sentinel node biopsy  Left 08/18/2015    Procedure: SIMPLE MASTECTOMY;  Surgeon: [name], MD;  Location: [facility] ORS;  Service: General;  Laterality: Left;  . Sentinel node biopsy Left 08/18/2015    Procedure: SENTINEL NODE BIOPSY;  Surgeon: [name], MD;  Location: [facility] ORS;  Service: General;  Laterality: Left;   Family History  Problem Relation Age of Onset  . Cancer Father     prostate  . Thyroid disease Daughter   . Heart attack Sister    Social History   Social History  . Marital Status: Widowed    Spouse Name: N/A  . Number of Children: 2  . Years of Education: H/S   Occupational History  . Part-Time    Social History Main Topics  . Smoking status: Current Every Day  Smoker -- 2.00 packs/day for 44 years    Types: Cigarettes  . Smokeless tobacco: Never Used  . Alcohol Use: 0.0 oz/week    0 Standard drinks or equivalent per week     Comment: 3-4 beers/day  . Drug Use: No  . Sexual Activity: Not on file   Other Topics Concern  . Not on file   Social History Narrative      Objective:   Physical Exam  Constitutional: She is oriented to person, place, and time. She appears well-developed and well-nourished.  HENT:  Left parotid swelling 5 cm wide x 8 cm length, soft, fluid-filled well-demarcated borders, with mild tenderness with palpation, normal smooth overlying skin.  No erythema inside her bilateral TMs clear without erythema, edema, or drainage.   Eyes: Pupils are equal, round, and reactive to light.  Neck: Normal range of motion. Neck supple.  Cardiovascular: Normal rate and regular rhythm.   Pulmonary/Chest: Effort normal and breath sounds normal.  Musculoskeletal: Normal range of motion.  Neurological: She is alert and oriented to person, place, and time.  Psychiatric: She has a normal mood and affect. Her behavior is normal. Judgment and thought content normal.  Vitals reviewed.   BP 160/86 mmHg  Pulse 84  Temp(Src) 98.1 F (36.7 C) (Oral)  Resp 16  Wt 178 lb (80.74 kg)     Assessment:     Acute parotitis.     Plan:     1. Parotitis, acute New problem. Worsening. Will treat as below. Call if worsens or does not improve.  - cefdinir (OMNICEF) 300 MG capsule; Take 1 capsule (300 mg total) by mouth 2 (two) times daily.  Dispense: 20 capsule; Refill: 0 - predniSONE (DELTASONE) 10 MG tablet; 6 po for 2 days and then 5 po for 2 days and then 4 po for 2 days and 3 po for 2 days and then 2 po for 2 days and then 1 po for 2 days.  Dispense: 42 tablet; Refill: 0  [name], MD

## 2016-01-07 ENCOUNTER — Ambulatory Visit
Admission: RE | Admit: 2016-01-07 | Discharge: 2016-01-07 | Disposition: A | Discharge status: Home / Self Care | Payer: BLUE CROSS/BLUE SHIELD | Source: Ambulatory Visit | Attending: Oncology | Admitting: Oncology

## 2016-01-07 ENCOUNTER — Telehealth: Payer: Self-pay | Admitting: *Deleted

## 2016-01-07 DIAGNOSIS — C50912 Malignant neoplasm of unspecified site of left female breast: Secondary | ICD-10-CM | POA: Insufficient documentation

## 2016-01-07 DIAGNOSIS — Z90710 Acquired absence of both cervix and uterus: Secondary | ICD-10-CM | POA: Insufficient documentation

## 2016-01-07 DIAGNOSIS — Z13820 Encounter for screening for osteoporosis: Secondary | ICD-10-CM | POA: Diagnosis not present

## 2016-01-07 NOTE — Telephone Encounter (Signed)
Has greatly improved, but continues to have tingling in her hands. No longer painful numbness as before. Also of not is that she was diagnosed Tuesday with Parotiitis and is on antibiotics and steroids for it. Do you want to order a different AI or wait until her sx have completely subsided?

## 2016-01-07 NOTE — Telephone Encounter (Signed)
Will not order new med until all sx have gone per Dr Oliva Bustard and need to see her in 6 weeks to follow up on this. Pt agrees to appt 3/2 @ 930 and ok with not starting new AI yet

## 2016-01-11 ENCOUNTER — Telehealth: Payer: Self-pay | Admitting: Family Medicine

## 2016-01-11 MED ORDER — DOXYCYCLINE HYCLATE 100 MG PO TABS: 100.0000 mg | ORAL_TABLET | Freq: Two times a day (BID) | ORAL | Status: DC

## 2016-01-11 NOTE — Telephone Encounter (Signed)
Pt advised-aa 

## 2016-01-11 NOTE — Telephone Encounter (Signed)
Pt saw Dr. Venia Minks on 01/05/16 and started taking cefdinir (OMNICEF) 300 MG capsule. Pt stated the medication has caused stomach pain and diarrhea. Pt would like to know if she could try something else or if she should take it a different way that wouldn't cause the pain and diarrhea. Pharmacy: [facility] Pao was advised that Dr. Venia Minks is out of the office this week. Please advise. Thanks TNP

## 2016-01-11 NOTE — Telephone Encounter (Signed)
Try doxycycline 100 mg twice a day for 5 days and discontinue the [facility]

## 2016-01-11 NOTE — Telephone Encounter (Signed)
Please review-aa 

## 2016-01-25 ENCOUNTER — Other Ambulatory Visit: Payer: Self-pay | Admitting: Oncology

## 2016-02-16 ENCOUNTER — Encounter: Payer: Self-pay | Admitting: General Surgery

## 2016-02-16 ENCOUNTER — Ambulatory Visit (INDEPENDENT_AMBULATORY_CARE_PROVIDER_SITE_OTHER): Payer: BLUE CROSS/BLUE SHIELD | Admitting: General Surgery

## 2016-02-16 VITALS — BP 150/80 | HR 72 | Resp 14 | Ht 63.0 in | Wt 177.0 lb

## 2016-02-16 DIAGNOSIS — N6310 Unspecified lump in the right breast, unspecified quadrant: Secondary | ICD-10-CM

## 2016-02-16 DIAGNOSIS — C50912 Malignant neoplasm of unspecified site of left female breast: Secondary | ICD-10-CM

## 2016-02-16 DIAGNOSIS — N63 Unspecified lump in breast: Secondary | ICD-10-CM | POA: Diagnosis not present

## 2016-02-16 NOTE — Progress Notes (Signed)
Patient ID: Heather Burke, female   DOB: [birth year], 65 y.o.   MRN: [id number]  Chief Complaint  Patient presents with  . Follow-up    breast cancer    HPI Heather Burke is a 65 y.o. female here today for her follow up breast cancer check up. Left mastectomy was performed on 08/18/15. She did stop her letrozole about 6 weeks ago because her hands and ankles started hurting and swelling some. Since then the pain is some better. She does have a follow up with Dr Oliva Bustard this week. She has a cough from her allergies.  HPI  Past Medical History  Diagnosis Date  . Hypertension   . Hyperlipidemia   . Thyroid disease   . Cancer ([place]) 06-08-15    invasive breast, ER+,PR+, Her 2 neg (FISH neg) x2.  Marland Kitchen Hypothyroidism   . GERD (gastroesophageal reflux disease)   . PONV (postoperative nausea and vomiting)     with Hysterectomy  . Heart murmur 2016    newly diagnosed, slight murmur  . Lupus [facility])     Past Surgical History  Procedure Laterality Date  . Breast biopsy Right 2002    negative/ Dr. Bary Castilla  . Core biopsy Left 06/08/15  . Abdominal hysterectomy    . Tonsillectomy    . Breast biopsy Left 06-08-15    INVASIVE MAMMARY CARCINOMA WITH PARTIAL SOLID PATTERN.   . Gum surgery  2006  . Simple mastectomy with axillary sentinel node biopsy Left 08/18/2015    Procedure: SIMPLE MASTECTOMY;  Surgeon: [name], MD;  Location: [facility] ORS;  Service: General;  Laterality: Left;  . Sentinel node biopsy Left 08/18/2015    Procedure: SENTINEL NODE BIOPSY;  Surgeon: [name], MD;  Location: [facility] ORS;  Service: General;  Laterality: Left;    Family History  Problem Relation Age of Onset  . Cancer Father     prostate  . Thyroid disease Daughter   . Heart attack Sister     Social History Social History  Substance Use Topics  . Smoking status: Current Every Day Smoker -- 2.00 packs/day for 44 years    Types: Cigarettes  . Smokeless tobacco: Never Used  . Alcohol Use: 0.0  oz/week    0 Standard drinks or equivalent per week     Comment: 3-4 beers/day    Allergies  Allergen Reactions  . Lisinopril Cough    Current Outpatient Prescriptions  Medication Sig Dispense Refill  . aspirin 81 MG tablet Take 81 mg by mouth daily.    . calcium carbonate (OS-CAL) 600 MG TABS tablet Take 1 tablet (600 mg total) by mouth 2 (two) times daily with a meal. 60 tablet   . Cholecalciferol (D3 ADULT PO) Take 1 tablet by mouth daily.     . fexofenadine (ALLEGRA) 180 MG tablet Take 180 mg by mouth daily.    Marland Kitchen FIBER PO Take by mouth.    . fluticasone (FLONASE) 50 MCG/ACT nasal spray Place 2 sprays into both nostrils daily. 48 g 3  . losartan-hydrochlorothiazide (HYZAAR) 100-25 MG tablet Take 1 tablet by mouth daily. 90 tablet 3  . lovastatin (MEVACOR) 20 MG tablet Take 1 tablet (20 mg total) by mouth at bedtime. 90 tablet 1  . Omega-3 Fatty Acids (FISH OIL) 1200 MG CAPS Take by mouth daily.    Marland Kitchen SYNTHROID 88 MCG tablet Take 1 tablet (88 mcg total) by mouth daily before breakfast. 90 tablet 3   No current facility-administered medications for this  visit.    Review of Systems Review of Systems  Constitutional: Negative.   Respiratory: Positive for cough.   Cardiovascular: Negative.     Blood pressure 150/80, pulse 72, resp. rate 14, height 5\' 3"  (1.6 m), weight 177 lb (80.287 kg).  Physical Exam Physical Exam  Constitutional: She is oriented to person, place, and time. She appears well-developed and well-nourished.  HENT:  Mouth/Throat: Oropharynx is clear and moist.  Eyes: Conjunctivae are normal. No scleral icterus.  Neck: Neck supple.  Cardiovascular: Normal rate, regular rhythm and normal heart sounds.   Pulmonary/Chest: Effort normal and breath sounds normal. Right breast exhibits no inverted nipple, no mass, no nipple discharge, no skin change and no tenderness.    3 x 5 area redundant skin medially left mastectomy site. 1 cm sebaceous cyst mid sternum. 1 cm  thickening at 1 o'clock  8 CFN right breast.  Normal shoulder range of motion bilaterally.  Musculoskeletal:       Arms: Lymphadenopathy:    She has no cervical adenopathy.    She has no axillary adenopathy.  Neurological: She is alert and oriented to person, place, and time.  Skin: Skin is warm and dry.  Psychiatric: Her behavior is normal.      Assessment    Doing well status post left mastectomy, no evidence of lymphedema.    Plan    The patient will have screening mammogram scheduled by her PCP in summer, we'll plan for office follow-up in August.    Follow up in August, mammograms with Dr Venia Minks.   PCP:  Heather Burke This information has been scribed by Karie Fetch Wheeler.    [name] 02/17/2016, 7:51 PM

## 2016-02-17 DIAGNOSIS — N6310 Unspecified lump in the right breast, unspecified quadrant: Secondary | ICD-10-CM | POA: Insufficient documentation

## 2016-02-18 ENCOUNTER — Inpatient Hospital Stay: Payer: BLUE CROSS/BLUE SHIELD | Attending: Oncology | Admitting: Oncology

## 2016-02-18 ENCOUNTER — Encounter: Payer: Self-pay | Admitting: Oncology

## 2016-02-18 ENCOUNTER — Inpatient Hospital Stay: Payer: BLUE CROSS/BLUE SHIELD

## 2016-02-18 VITALS — BP 171/98 | HR 76 | Temp 97.4°F | Wt 177.7 lb

## 2016-02-18 DIAGNOSIS — R011 Cardiac murmur, unspecified: Secondary | ICD-10-CM | POA: Insufficient documentation

## 2016-02-18 DIAGNOSIS — E039 Hypothyroidism, unspecified: Secondary | ICD-10-CM | POA: Insufficient documentation

## 2016-02-18 DIAGNOSIS — C50912 Malignant neoplasm of unspecified site of left female breast: Secondary | ICD-10-CM | POA: Insufficient documentation

## 2016-02-18 DIAGNOSIS — Z79899 Other long term (current) drug therapy: Secondary | ICD-10-CM | POA: Insufficient documentation

## 2016-02-18 DIAGNOSIS — Z87891 Personal history of nicotine dependence: Secondary | ICD-10-CM | POA: Diagnosis not present

## 2016-02-18 DIAGNOSIS — K219 Gastro-esophageal reflux disease without esophagitis: Secondary | ICD-10-CM | POA: Insufficient documentation

## 2016-02-18 DIAGNOSIS — E785 Hyperlipidemia, unspecified: Secondary | ICD-10-CM

## 2016-02-18 DIAGNOSIS — M8580 Other specified disorders of bone density and structure, unspecified site: Secondary | ICD-10-CM | POA: Insufficient documentation

## 2016-02-18 DIAGNOSIS — R200 Anesthesia of skin: Secondary | ICD-10-CM | POA: Insufficient documentation

## 2016-02-18 DIAGNOSIS — Z170 Estrogen receptor positive status [ER+]: Secondary | ICD-10-CM | POA: Insufficient documentation

## 2016-02-18 DIAGNOSIS — L930 Discoid lupus erythematosus: Secondary | ICD-10-CM | POA: Insufficient documentation

## 2016-02-18 DIAGNOSIS — Z808 Family history of malignant neoplasm of other organs or systems: Secondary | ICD-10-CM | POA: Diagnosis not present

## 2016-02-18 DIAGNOSIS — Z7982 Long term (current) use of aspirin: Secondary | ICD-10-CM | POA: Insufficient documentation

## 2016-02-18 DIAGNOSIS — I10 Essential (primary) hypertension: Secondary | ICD-10-CM | POA: Insufficient documentation

## 2016-02-18 DIAGNOSIS — Z79811 Long term (current) use of aromatase inhibitors: Secondary | ICD-10-CM | POA: Insufficient documentation

## 2016-02-18 LAB — COMPREHENSIVE METABOLIC PANEL
ALBUMIN: 4.1 g/dL (ref 3.5–5.0)
ALK PHOS: 61 U/L (ref 38–126)
ALT: 25 U/L (ref 14–54)
AST: 22 U/L (ref 15–41)
Anion gap: 6 (ref 5–15)
BUN: 16 mg/dL (ref 6–20)
CALCIUM: 9 mg/dL (ref 8.9–10.3)
CO2: 29 mmol/L (ref 22–32)
CREATININE: 0.72 mg/dL (ref 0.44–1.00)
Chloride: 98 mmol/L — ABNORMAL LOW (ref 101–111)
Glucose, Bld: 100 mg/dL — ABNORMAL HIGH (ref 65–99)
Potassium: 4 mmol/L (ref 3.5–5.1)
SODIUM: 133 mmol/L — AB (ref 135–145)
Total Bilirubin: 0.5 mg/dL (ref 0.3–1.2)
Total Protein: 7.6 g/dL (ref 6.5–8.1)

## 2016-02-18 LAB — CBC WITH DIFFERENTIAL/PLATELET
BASOS ABS: 0 10*3/uL (ref 0–0.1)
BASOS PCT: 1 %
EOS ABS: 0.1 10*3/uL (ref 0–0.7)
EOS PCT: 1 %
HCT: 41.2 % (ref 35.0–47.0)
HEMOGLOBIN: 14.4 g/dL (ref 12.0–16.0)
LYMPHS ABS: 2.2 10*3/uL (ref 1.0–3.6)
Lymphocytes Relative: 35 %
MCH: 32.6 pg (ref 26.0–34.0)
MCHC: 34.8 g/dL (ref 32.0–36.0)
MCV: 93.6 fL (ref 80.0–100.0)
Monocytes Absolute: 0.6 10*3/uL (ref 0.2–0.9)
Monocytes Relative: 10 %
NEUTROS PCT: 53 %
Neutro Abs: 3.4 10*3/uL (ref 1.4–6.5)
PLATELETS: 213 10*3/uL (ref 150–440)
RBC: 4.41 MIL/uL (ref 3.80–5.20)
RDW: 13.5 % (ref 11.5–14.5)
WBC: 6.3 10*3/uL (ref 3.6–11.0)

## 2016-02-18 MED ORDER — EXEMESTANE 25 MG PO TABS: 25.0000 mg | ORAL_TABLET | Freq: Every day | ORAL | Status: DC

## 2016-02-18 NOTE — Progress Notes (Signed)
[place] @ [facility] [phone]  Fax:([phone]) [place]: 1951/08/07  MR#: [id number]  ALP#:[id number]  Patient Care Team: [name], MD as PCP - General (Family Medicine) [name], MD (Family Medicine) [name], MD (General Surgery)  CHIEF COMPLAINT:  Chief Complaint  Patient presents with  . Breast Cancer   1.  Carcinoma of breast (left) status post mastectomy in August of 2016 Multiple tumors.  Lobular cancer 18 mm, 9 mm, 0.9 mm ,  One  invasive mammary carcinoma 5 mm Estrogen receptor positive progesterone receptor positive HER-2 receptor negative. MamMO  print shows low risk of recurrent disease MaMO print assays shows no   significant benefit from chemotherapy (September, 2016) Patient started on letrozole (September, 2016) Patient has developed side effect with numbness in upper extremity.  Letrozole was discontinued according to patient numbness improved 2.  Patient will be started on Aromasin 25 mg by mouth daily from March 3 marked 2017 Bone density was done in February of 2017  Showing   osteopenia   VISIT DIAGNOSIS:  Cancer of left breast    No history exists.    Oncology Flowsheet 08/18/2015  dexamethasone (DECADRON) IJ -  ondansetron (ZOFRAN) IV -    INTERVAL HISTORY:  [age]-year-old lady with abnormal mammogram and underwent further evaluation .  Most recent ultrasound and core biopsy was done on June of 2016 showing invasive breast cancer.  Patient does confirm regular self breast examination and CT did not feel any palpable mass Patient did have regular mammogram in the past. Patient underwent evaluation followed by mastectomy in multiple tumors were found.  The largest one was 18 mm.  Lobular carcinoma There was one small invasive ductal carcinoma . Estrogen receptor positive progesterone receptor positive HER-2 receptor negative Patient is here for further systemic and local adjuvant  therapy Bone density shows osteopenia.  Patient is due for a mammogram in June to be arranged by primary care physician REVIEW OF SYSTEMS:   GENERAL:  Feels good.  Active.  No fevers, sweats or weight loss. PERFORMANCE STATUS (ECOG):  0 HEENT:  No visual changes, runny nose, sore throat, mouth sores or tenderness. Lungs: No shortness of breath or cough.  No hemoptysis. Cardiac:  No chest pain, palpitations, orthopnea, or PND. GI:  No nausea, vomiting, diarrhea, constipation, melena or hematochezia. GU:  No urgency, frequency, dysuria, or hematuria. Musculoskeletal:  No back pain.  No joint pain.  No muscle tenderness. Extremities:  No pain or swelling. Skin:  No rashes or skin changes. Neuro:  No headache, numbness or weakness, balance or coordination issues. Endocrine:  No diabetes, thyroid issues, hot flashes or night sweats. Psych:  No mood changes, depression or anxiety. Pain:  No focal pain. Review of systems:  All other systems reviewed and found to be negative.  As per HPI. Otherwise, a complete review of systems is negatve.  PAST MEDICAL HISTORY: Past Medical History  Diagnosis Date  . Hypertension   . Hyperlipidemia   . Thyroid disease   . Cancer ([place]) 06-08-15    invasive breast, ER+,PR+, Her 2 neg (FISH neg) x2.  Marland Kitchen Hypothyroidism   . GERD (gastroesophageal reflux disease)   . PONV (postoperative nausea and vomiting)     with Hysterectomy  . Heart murmur 2016    newly diagnosed, slight murmur  . Lupus ([place])     PAST SURGICAL HISTORY: Past Surgical History  Procedure Laterality Date  . Breast  biopsy Right 2002    negative/ Dr. Bary Castilla  . Core biopsy Left 06/08/15  . Abdominal hysterectomy    . Tonsillectomy    . Breast biopsy Left 06-08-15    INVASIVE MAMMARY CARCINOMA WITH PARTIAL SOLID PATTERN.   . Gum surgery  2006  . Simple mastectomy with axillary sentinel node biopsy Left 08/18/2015    Procedure: SIMPLE MASTECTOMY;  Surgeon: [name], MD;   Location: [facility] ORS;  Service: General;  Laterality: Left;  . Sentinel node biopsy Left 08/18/2015    Procedure: SENTINEL NODE BIOPSY;  Surgeon: [name], MD;  Location: [facility] ORS;  Service: General;  Laterality: Left;    FAMILY HISTORY Family History  Problem Relation Age of Onset  . Cancer Father     prostate  . Thyroid disease Daughter   . Heart attack Sister     GYNECOLOGIC HISTORY:  No LMP recorded. Patient has had a hysterectomy.     ADVANCED DIRECTIVES:  Patient does not have any living will or healthcare power of attorney.  Information was given .  Available resources had been discussed.  We will follow-up on subsequent appointments regarding this issue  HEALTH MAINTENANCE: Social History  Substance Use Topics  . Smoking status: Current Every Day Smoker -- 2.00 packs/day for 44 years    Types: Cigarettes  . Smokeless tobacco: Never Used  . Alcohol Use: 0.0 oz/week    0 Standard drinks or equivalent per week     Comment: 3-4 beers/day       Allergies  Allergen Reactions  . Lisinopril Cough    Current Outpatient Prescriptions  Medication Sig Dispense Refill  . aspirin 81 MG tablet Take 81 mg by mouth daily.    . calcium carbonate (OS-CAL) 600 MG TABS tablet Take 1 tablet (600 mg total) by mouth 2 (two) times daily with a meal. 60 tablet   . Cholecalciferol (D3 ADULT PO) Take 1 tablet by mouth daily.     . fexofenadine (ALLEGRA) 180 MG tablet Take 180 mg by mouth daily.    Marland Kitchen FIBER PO Take by mouth.    . fluticasone (FLONASE) 50 MCG/ACT nasal spray Place 2 sprays into both nostrils daily. 48 g 3  . losartan-hydrochlorothiazide (HYZAAR) 100-25 MG tablet Take 1 tablet by mouth daily. 90 tablet 3  . lovastatin (MEVACOR) 20 MG tablet Take 1 tablet (20 mg total) by mouth at bedtime. 90 tablet 1  . Omega-3 Fatty Acids (FISH OIL) 1200 MG CAPS Take by mouth daily.    Marland Kitchen SYNTHROID 88 MCG tablet Take 1 tablet (88 mcg total) by mouth daily before breakfast. 90 tablet 3     No current facility-administered medications for this visit.    OBJECTIVE: PHYSICAL EXAM: GENERAL:  Well developed, well nourished, sitting comfortably in the exam room in no acute distress. MENTAL STATUS:  Alert and oriented to person, place and time. .  RESPIRATORY:  Clear to auscultation without rales, wheezes or rhonchi. CARDIOVASCULAR:  Regular rate and rhythm without murmur, rub or gallop. BREAST:  Right breast without masses, skin changes or nipple discharge.  Left  Breast ; mastectomy ABDOMEN:  Soft, non-tender, with active bowel sounds, and no hepatosplenomegaly.  No masses. BACK:  No CVA tenderness.  No tenderness on percussion of the back or rib cage. SKIN:  No rashes, ulcers or lesions. EXTREMITIES: No edema, no skin discoloration or tenderness.  No palpable cords. LYMPH NODES: No palpable cervical, supraclavicular, axillary or inguinal adenopathy  NEUROLOGICAL: Unremarkable. PSYCH:  Appropriate.  Filed Vitals:   02/18/16 0938  BP: 171/98  Pulse: 76  Temp: 97.4 F (36.3 C)     Body mass index is 31.48 kg/(m^2).    ECOG FS:0 - Asymptomatic  LAB RESULTS:  No visits with results within 5 Day(s) from this visit. Latest known visit with results is:  Office Visit on 01/01/2016  Component Date Value Ref Range Status  . Hemoglobin A1C 01/01/2016 6.8   Final   previously 6.2%  . Est. average glucose Bld gHb Est-m* 01/01/2016 148   Final        ASSESSMENT:  Carcinoma of left breast status post mastectomy Multiple tumors Sentinel lymph nodes are negative  Case was discussed in tumor conference. Mammo print results shows low risk and no benefit from chemotherapy Patient had that tingling numbness in upper extremity due to letrozole which was discontinued Patient will be started on Aromasin.  Reevaluation in 6 month Because of my retirement on patient will be followed in 6 months by   DR. CORCORAN Patient expressed understanding and was in agreement with this  plan. She also understands that She can call clinic at any time with any questions, concerns, or complaints.    Breast cancer, stage 1   Staging form: Breast, AJCC 7th Edition     Clinical: Stage IA (T1c(m), N0, M0) - Marni Griffon, MD   02/18/2016 10:05 AM

## 2016-02-18 NOTE — Progress Notes (Signed)
Patient continues to have neuropathy in bilateral hands.

## 2016-03-04 ENCOUNTER — Ambulatory Visit (INDEPENDENT_AMBULATORY_CARE_PROVIDER_SITE_OTHER): Payer: BLUE CROSS/BLUE SHIELD | Admitting: Physician Assistant

## 2016-03-04 ENCOUNTER — Encounter: Payer: Self-pay | Admitting: Physician Assistant

## 2016-03-04 VITALS — BP 138/84 | HR 104 | Temp 99.9°F | Resp 18 | Wt 177.0 lb

## 2016-03-04 DIAGNOSIS — J40 Bronchitis, not specified as acute or chronic: Secondary | ICD-10-CM

## 2016-03-04 DIAGNOSIS — R509 Fever, unspecified: Secondary | ICD-10-CM

## 2016-03-04 DIAGNOSIS — J069 Acute upper respiratory infection, unspecified: Secondary | ICD-10-CM

## 2016-03-04 LAB — POC INFLUENZA A&B (BINAX/QUICKVUE)
Influenza A, POC: NEGATIVE
Influenza B, POC: NEGATIVE

## 2016-03-04 MED ORDER — HYDROCODONE-HOMATROPINE 5-1.5 MG/5ML PO SYRP
5.0000 mL | ORAL_SOLUTION | Freq: Three times a day (TID) | ORAL | Status: DC | PRN
Start: 2016-03-04 — End: 2016-04-15

## 2016-03-04 MED ORDER — PREDNISONE 10 MG PO TABS: ORAL_TABLET | ORAL | Status: DC

## 2016-03-04 MED ORDER — AMOXICILLIN 875 MG PO TABS
875.0000 mg | ORAL_TABLET | Freq: Two times a day (BID) | ORAL | Status: DC
Start: 2016-03-04 — End: 2016-04-15

## 2016-03-04 NOTE — Progress Notes (Signed)
Patient ID: Heather Burke, female   DOB: 07-Feb-1951, 65 y.o.   MRN: [id number]       Patient: Heather Burke Female    DOB: 1951-03-27   65 y.o.   MRN: [id number] Visit Date: 03/04/2016  Today's Provider: [name], PA-C   Chief Complaint  Patient presents with  . Cough  . Fever    body aches   Subjective:    HPI Pt presents with flu like symptoms. She has cough, shortness of breath, fever, body aches, mild nasal congestion. Her mother is actively dying and 2 family members have tested positive for flu and she has been around them. She would like to make sure this is not the flu. She states her symptoms only started this morning.  She also states there has been a lot of URI and bronchitis going around [facility] as well. She had low grade fever of 100 this morning at [facility].  This was checked by nursing staff there.    Allergies  Allergen Reactions  . Lisinopril Cough   Previous Medications   ASPIRIN 81 MG TABLET    Take 81 mg by mouth daily.   CALCIUM CARBONATE (OS-CAL) 600 MG TABS TABLET    Take 1 tablet (600 mg total) by mouth 2 (two) times daily with a meal.   CHOLECALCIFEROL (D3 ADULT PO)    Take 1 tablet by mouth daily.    EXEMESTANE (AROMASIN) 25 MG TABLET    Take 1 tablet (25 mg total) by mouth daily after breakfast.   FEXOFENADINE (ALLEGRA) 180 MG TABLET    Take 180 mg by mouth daily.   FIBER PO    Take by mouth.   FLUTICASONE (FLONASE) 50 MCG/ACT NASAL SPRAY    Place 2 sprays into both nostrils daily.   LOSARTAN-HYDROCHLOROTHIAZIDE (HYZAAR) 100-25 MG TABLET    Take 1 tablet by mouth daily.   LOVASTATIN (MEVACOR) 20 MG TABLET    Take 1 tablet (20 mg total) by mouth at bedtime.   OMEGA-3 FATTY ACIDS (FISH OIL) 1200 MG CAPS    Take by mouth daily.   SYNTHROID 88 MCG TABLET    Take 1 tablet (88 mcg total) by mouth daily before breakfast.    Review of Systems  Constitutional: Positive for fever and fatigue.  HENT: Positive for congestion, ear pain  (right), postnasal drip, rhinorrhea and sinus pressure. Negative for sneezing, sore throat, tinnitus and trouble swallowing.   Eyes: Negative.   Respiratory: Positive for cough and shortness of breath. Negative for chest tightness and wheezing.   Cardiovascular: Negative for chest pain, palpitations and leg swelling.  Gastrointestinal: Negative for nausea, vomiting and abdominal pain.  Endocrine: Negative.   Genitourinary: Negative.   Musculoskeletal: Negative.   Skin: Negative.   Allergic/Immunologic: Negative.   Neurological: Negative.   Hematological: Negative.   Psychiatric/Behavioral: Negative.     Social History  Substance Use Topics  . Smoking status: Current Every Day Smoker -- 2.00 packs/day for 44 years    Types: Cigarettes  . Smokeless tobacco: Never Used  . Alcohol Use: 0.0 oz/week    0 Standard drinks or equivalent per week     Comment: 3-4 beers/day   Objective:   BP 138/84 mmHg  Pulse 104  Temp(Src) 99.9 F (37.7 C) (Oral)  Resp 18  Wt 177 lb (80.287 kg)  SpO2 93%  Physical Exam  Constitutional: She appears well-developed and well-nourished. No distress.  HENT:  Head: Normocephalic and atraumatic.  Right  Ear: Hearing, external ear and ear canal normal. Tympanic membrane is erythematous. Tympanic membrane is not bulging. A middle ear effusion is present.  Left Ear: Hearing, external ear and ear canal normal. Tympanic membrane is not erythematous and not bulging. A middle ear effusion is present.  Nose: Mucosal edema and rhinorrhea present. Right sinus exhibits no maxillary sinus tenderness and no frontal sinus tenderness. Left sinus exhibits no maxillary sinus tenderness and no frontal sinus tenderness.  Mouth/Throat: Uvula is midline, oropharynx is clear and moist and mucous membranes are normal. No oropharyngeal exudate, posterior oropharyngeal edema or posterior oropharyngeal erythema.  Eyes: Conjunctivae are normal. Pupils are equal, round, and reactive to  light. Right eye exhibits no discharge. Left eye exhibits no discharge. No scleral icterus.  Neck: Normal range of motion. Neck supple. No tracheal deviation present. No thyromegaly present.  Cardiovascular: Normal rate, regular rhythm and normal heart sounds.  Exam reveals no gallop and no friction rub.   No murmur heard. Pulmonary/Chest: Effort normal. No stridor. No respiratory distress. She has no decreased breath sounds. She has no wheezes. She has rhonchi (expiratory throughout). She has no rales.  Lymphadenopathy:    She has no cervical adenopathy.  Skin: Skin is warm and dry. She is not diaphoretic.  Vitals reviewed.       Assessment & Plan:     1. Upper respiratory infection Worsening symptoms, around sick contacts and fever.  Will treat with amoxicillin as below. She may take Mucinex DM for congestion. Alternate tylenol and IBU as needed for fevers and body aches. Try to stay well hydrated and get plenty of rest. She is to call if symptoms worsen or fail to improve. - amoxicillin (AMOXIL) 875 MG tablet; Take 1 tablet (875 mg total) by mouth 2 (two) times daily.  Dispense: 20 tablet; Refill: 0  2. Bronchitis Worsening shortness of breath with rhonchi heard on auscultation. Will give prednisone as below and hycodan cough syrup.  Advised of drowsiness precautions. Pulse ox was 93% and I looked to see if another pulse ox had been documented but could not find one.  She is a 2ppd smoker so this may be close to her baseline. She is to call if symptoms worsen. - predniSONE (DELTASONE) 10 MG tablet; Take 6 tabs PO on day 1&2, 5 tabs PO on day 3&4, 4 tabs PO on day 5&6, 3 tabs PO on day 7&8, 2 tabs PO on day 9&10, 1 tab PO on day 11&12.  Dispense: 42 tablet; Refill: 0 - HYDROcodone-homatropine (HYCODAN) 5-1.5 MG/5ML syrup; Take 5 mLs by mouth every 8 (eight) hours as needed.  Dispense: 180 mL; Refill: 0       [name], PA-C  [facility]

## 2016-03-04 NOTE — Patient Instructions (Signed)
Upper Respiratory Infection, Adult Most upper respiratory infections (URIs) are a viral infection of the air passages leading to the lungs. A URI affects the nose, throat, and upper air passages. The most common type of URI is nasopharyngitis and is typically referred to as "the common cold." URIs run their course and usually go away on their own. Most of the time, a URI does not require medical attention, but sometimes a bacterial infection in the upper airways can follow a viral infection. This is called a secondary infection. Sinus and middle ear infections are common types of secondary upper respiratory infections. Bacterial pneumonia can also complicate a URI. A URI can worsen asthma and chronic obstructive pulmonary disease (COPD). Sometimes, these complications can require emergency medical care and may be life threatening.  CAUSES Almost all URIs are caused by viruses. A virus is a type of germ and can spread from one person to another.  RISKS FACTORS You may be at risk for a URI if:   You smoke.   You have chronic heart or lung disease.  You have a weakened defense (immune) system.   You are very young or very old.   You have nasal allergies or asthma.  You work in crowded or poorly ventilated areas.  You work in health care facilities or schools. SIGNS AND SYMPTOMS  Symptoms typically develop 2-3 days after you come in contact with a cold virus. Most viral URIs last 7-10 days. However, viral URIs from the influenza virus (flu virus) can last 14-18 days and are typically more severe. Symptoms may include:   Runny or stuffy (congested) nose.   Sneezing.   Cough.   Sore throat.   Headache.   Fatigue.   Fever.   Loss of appetite.   Pain in your forehead, behind your eyes, and over your cheekbones (sinus pain).  Muscle aches.  DIAGNOSIS  Your health care provider may diagnose a URI by:  Physical exam.  Tests to check that your symptoms are not due to  another condition such as:  Strep throat.  Sinusitis.  Pneumonia.  Asthma. TREATMENT  A URI goes away on its own with time. It cannot be cured with medicines, but medicines may be prescribed or recommended to relieve symptoms. Medicines may help:  Reduce your fever.  Reduce your cough.  Relieve nasal congestion. HOME CARE INSTRUCTIONS   Take medicines only as directed by your health care provider.   Gargle warm saltwater or take cough drops to comfort your throat as directed by your health care provider.  Use a warm mist humidifier or inhale steam from a shower to increase air moisture. This may make it easier to breathe.  Drink enough fluid to keep your urine clear or pale yellow.   Eat soups and other clear broths and maintain good nutrition.   Rest as needed.   Return to work when your temperature has returned to normal or as your health care provider advises. You may need to stay home longer to avoid infecting others. You can also use a face mask and careful hand washing to prevent spread of the virus.  Increase the usage of your inhaler if you have asthma.   Do not use any tobacco products, including cigarettes, chewing tobacco, or electronic cigarettes. If you need help quitting, ask your health care provider. PREVENTION  The best way to protect yourself from getting a cold is to practice good hygiene.   Avoid oral or hand contact with people with cold   symptoms.   Wash your hands often if contact occurs.  There is no clear evidence that vitamin C, vitamin E, echinacea, or exercise reduces the chance of developing a cold. However, it is always recommended to get plenty of rest, exercise, and practice good nutrition.  SEEK MEDICAL CARE IF:   You are getting worse rather than better.   Your symptoms are not controlled by medicine.   You have chills.  You have worsening shortness of breath.  You have brown or red mucus.  You have yellow or brown nasal  discharge.  You have pain in your face, especially when you bend forward.  You have a fever.  You have swollen neck glands.  You have pain while swallowing.  You have white areas in the back of your throat. SEEK IMMEDIATE MEDICAL CARE IF:   You have severe or persistent:  Headache.  Ear pain.  Sinus pain.  Chest pain.  You have chronic lung disease and any of the following:  Wheezing.  Prolonged cough.  Coughing up blood.  A change in your usual mucus.  You have a stiff neck.  You have changes in your:  Vision.  Hearing.  Thinking.  Mood. MAKE SURE YOU:   Understand these instructions.  Will watch your condition.  Will get help right away if you are not doing well or get worse.   This information is not intended to replace advice given to you by your health care provider. Make sure you discuss any questions you have with your health care provider.   Document Released: 05/31/2001 Document Revised: 04/21/2015 Document Reviewed: 03/12/2014 Elsevier Interactive Patient Education 2016 Else[employer] Inc[employer] Acute Bronchitis Bronchitis is inflammation of the airways that extend from the windpipe into the lungs (bronchi). The inflammation often causes mucus to develop. This leads to a cough, which is the most common symptom of bronchitis.  In acute bronchitis, the condition usually develops suddenly and goes away over time, usually in a couple weeks. Smoking, allergies, and asthma can make bronchitis worse. Repeated episodes of bronchitis may cause further lung problems.  CAUSES Acute bronchitis is most often caused by the same virus that causes a cold. The virus can spread from person to person (contagious) through coughing, sneezing, and touching contaminated objects. SIGNS AND SYMPTOMS   Cough.   Fever.   Coughing up mucus.   Body aches.   Chest congestion.   Chills.   Shortness of breath.   Sore throat.  DIAGNOSIS  Acute bronchitis is  usually diagnosed through a physical exam. Your health care provider will also ask you questions about your medical history. Tests, such as chest X-rays, are sometimes done to rule out other conditions.  TREATMENT  Acute bronchitis usually goes away in a couple weeks. Oftentimes, no medical treatment is necessary. Medicines are sometimes given for relief of fever or cough. Antibiotic medicines are usually not needed but may be prescribed in certain situations. In some cases, an inhaler may be recommended to help reduce shortness of breath and control the cough. A cool mist vaporizer may also be used to help thin bronchial secretions and make it easier to clear the chest.  HOME CARE INSTRUCTIONS  Get plenty of rest.   Drink enough fluids to keep your urine clear or pale yellow (unless you have a medical condition that requires fluid restriction). Increasing fluids may help thin your respiratory secretions (sputum) and reduce chest congestion, and it will prevent dehydration.   Take medicines only as directed by  your health care provider.  If you were prescribed an antibiotic medicine, finish it all even if you start to feel better.  Avoid smoking and secondhand smoke. Exposure to cigarette smoke or irritating chemicals will make bronchitis worse. If you are a smoker, consider using nicotine gum or skin patches to help control withdrawal symptoms. Quitting smoking will help your lungs heal faster.   Reduce the chances of another bout of acute bronchitis by washing your hands frequently, avoiding people with cold symptoms, and trying not to touch your hands to your mouth, nose, or eyes.   Keep all follow-up visits as directed by your health care provider.  SEEK MEDICAL CARE IF: Your symptoms do not improve after 1 week of treatment.  SEEK IMMEDIATE MEDICAL CARE IF:  You develop an increased fever or chills.   You have chest pain.   You have severe shortness of breath.  You have bloody  sputum.   You develop dehydration.  You faint or repeatedly feel like you are going to pass out.  You develop repeated vomiting.  You develop a severe headache. MAKE SURE YOU:   Understand these instructions.  Will watch your condition.  Will get help right away if you are not doing well or get worse.   This information is not intended to replace advice given to you by your health care provider. Make sure you discuss any questions you have with your health care provider.   Document Released: 01/12/2005 Document Revised: 12/26/2014 Document Reviewed: 05/28/2013 Elsevier Interactive Patient Education [employer].

## 2016-03-09 ENCOUNTER — Other Ambulatory Visit: Payer: BLUE CROSS/BLUE SHIELD

## 2016-03-09 ENCOUNTER — Ambulatory Visit: Payer: BLUE CROSS/BLUE SHIELD | Admitting: Oncology

## 2016-03-25 ENCOUNTER — Ambulatory Visit: Payer: BLUE CROSS/BLUE SHIELD | Admitting: Family Medicine

## 2016-04-15 ENCOUNTER — Ambulatory Visit (INDEPENDENT_AMBULATORY_CARE_PROVIDER_SITE_OTHER): Payer: BLUE CROSS/BLUE SHIELD | Admitting: Family Medicine

## 2016-04-15 ENCOUNTER — Encounter: Payer: Self-pay | Admitting: Family Medicine

## 2016-04-15 VITALS — BP 160/90 | HR 84 | Temp 98.0°F | Resp 16 | Wt 179.0 lb

## 2016-04-15 DIAGNOSIS — E039 Hypothyroidism, unspecified: Secondary | ICD-10-CM | POA: Diagnosis not present

## 2016-04-15 DIAGNOSIS — E1129 Type 2 diabetes mellitus with other diabetic kidney complication: Secondary | ICD-10-CM | POA: Diagnosis not present

## 2016-04-15 DIAGNOSIS — J209 Acute bronchitis, unspecified: Secondary | ICD-10-CM

## 2016-04-15 DIAGNOSIS — E559 Vitamin D deficiency, unspecified: Secondary | ICD-10-CM

## 2016-04-15 DIAGNOSIS — E785 Hyperlipidemia, unspecified: Secondary | ICD-10-CM

## 2016-04-15 DIAGNOSIS — I10 Essential (primary) hypertension: Secondary | ICD-10-CM | POA: Diagnosis not present

## 2016-04-15 LAB — POCT GLYCOSYLATED HEMOGLOBIN (HGB A1C)
ESTIMATED AVERAGE GLUCOSE: 157
Hemoglobin A1C: 7.1

## 2016-04-15 MED ORDER — AZITHROMYCIN 250 MG PO TABS: ORAL_TABLET | ORAL | Status: DC

## 2016-04-15 MED ORDER — METFORMIN HCL 500 MG PO TABS: 500.0000 mg | ORAL_TABLET | Freq: Two times a day (BID) | ORAL | Status: DC

## 2016-04-15 NOTE — Progress Notes (Signed)
Subjective:    Patient ID: Heather Burke, female    DOB: [birth year], 65 y.o.   MRN: [id number]  HPI Comments: Pt not fasting.  Diabetes She presents for her follow-up diabetic visit. She has type 2 (Last A1C was 01/01/2016 and was 6.8%) diabetes mellitus. There are no hypoglycemic associated symptoms. Associated symptoms include polyuria (HCTZ). Pertinent negatives for diabetes include no blurred vision, no chest pain, no fatigue, no foot paresthesias, no foot ulcerations, no polydipsia, no polyphagia, no visual change, no weakness and no weight loss. Risk factors for coronary artery disease include diabetes mellitus, dyslipidemia, tobacco exposure, hypertension, post-menopausal and family history. When asked about current treatments, none were reported. She is following a generally healthy diet. Exercise: stays active while keeping 4 YO grandson. Home blood sugar record trend: not being checked. An ACE inhibitor/angiotensin II receptor blocker is being taken. Eye exam is not current.  Hypertension This is a chronic problem. The problem is uncontrolled (BP is 160/90 today). Associated symptoms include shortness of breath. Pertinent negatives include no blurred vision, chest pain, neck pain, orthopnea, palpitations or peripheral edema. Treatments tried: currently taking Hyzaar 100-25 mg. There are no compliance problems.   Hyperlipidemia This is a chronic problem. Lipid results: Last checked 04/28/2015. Total- 161, trig- 258, HDL- 37, LDL- 72. Associated symptoms include shortness of breath. Pertinent negatives include no chest pain. Current antihyperlipidemic treatment includes statins (Lovastatin 20 mg). There are no compliance problems.       Review of Systems  Constitutional: Negative for weight loss and fatigue.  Eyes: Negative for blurred vision.  Respiratory: Positive for cough and shortness of breath.   Cardiovascular: Negative for chest pain, palpitations and orthopnea.  Endocrine:  Positive for polyuria (HCTZ). Negative for polydipsia and polyphagia.  Musculoskeletal: Negative for neck pain.  Neurological: Negative for weakness.       Objective:   Physical Exam  Constitutional: She appears well-developed and well-nourished.  Cardiovascular: Normal rate and regular rhythm.   Pulmonary/Chest: Effort normal.  Course breath sounds  Psychiatric: She has a normal mood and affect. Her behavior is normal.   BP 160/90 mmHg  Pulse 84  Temp(Src) 98 F (36.7 C) (Oral)  Resp 16  Wt 179 lb (81.194 kg)     Assessment & Plan:  1. Type 2 diabetes mellitus with other diabetic kidney complication (HCC) Worsening. Will add medication as below. FU 3 months.  Results for orders placed or performed in visit on 04/15/16  POCT glycosylated hemoglobin (Hb A1C)  Result Value Ref Range   Hemoglobin A1C 7.1    Est. average glucose Bld gHb Est-mCnc 157     - POCT glycosylated hemoglobin (Hb A1C) - metFORMIN (GLUCOPHAGE) 500 MG tablet; Take 1 tablet (500 mg total) by mouth 2 (two) times daily with a meal.  Dispense: 60 tablet; Refill: 5  2. Hyperlipemia FU pending results. - Lipid panel  3. Essential hypertension Continue current medications and FU pending results. - CBC with Differential/Platelet - Comprehensive metabolic panel  4. Hypothyroidism, unspecified hypothyroidism type FU pending results. - TSH  5. Avitaminosis D Due to aromasin inhibitor, Vitamin D goal is >70. - VITAMIN D 25 Hydroxy (Vit-D Deficiency, Fractures)  6. Acute bronchitis, unspecified organism Start abx as below. - azithromycin (ZITHROMAX) 250 MG tablet; Take 2 tablets on day 1, then 1 tablet each remaining day until gone.  Dispense: 6 tablet; Refill: 0    Patient seen and examined by Jerrell Belfast, MD, and note scribed by Raquel Sarna  Drozdowski, CMA.  I have reviewed the document for accuracy and completeness and I agree with above. Jerrell Belfast, MD   [name], MD

## 2016-04-21 LAB — CBC WITH DIFFERENTIAL/PLATELET
BASOS: 1 %
Basophils Absolute: 0 10*3/uL (ref 0.0–0.2)
EOS (ABSOLUTE): 0.2 10*3/uL (ref 0.0–0.4)
Eos: 2 %
HEMOGLOBIN: 16 g/dL — AB (ref 11.1–15.9)
Hematocrit: 44.7 % (ref 34.0–46.6)
IMMATURE GRANS (ABS): 0 10*3/uL (ref 0.0–0.1)
Immature Granulocytes: 0 %
LYMPHS ABS: 2.5 10*3/uL (ref 0.7–3.1)
LYMPHS: 31 %
MCH: 33 pg (ref 26.6–33.0)
MCHC: 35.8 g/dL — AB (ref 31.5–35.7)
MCV: 92 fL (ref 79–97)
MONOCYTES: 11 %
Monocytes Absolute: 0.9 10*3/uL (ref 0.1–0.9)
NEUTROS ABS: 4.6 10*3/uL (ref 1.4–7.0)
Neutrophils: 55 %
Platelets: 275 10*3/uL (ref 150–379)
RBC: 4.85 x10E6/uL (ref 3.77–5.28)
RDW: 13.1 % (ref 12.3–15.4)
WBC: 8.3 10*3/uL (ref 3.4–10.8)

## 2016-04-21 LAB — COMPREHENSIVE METABOLIC PANEL
A/G RATIO: 1.6 (ref 1.2–2.2)
ALBUMIN: 4.6 g/dL (ref 3.6–4.8)
ALK PHOS: 64 IU/L (ref 39–117)
ALT: 33 IU/L — ABNORMAL HIGH (ref 0–32)
AST: 27 IU/L (ref 0–40)
BILIRUBIN TOTAL: 0.4 mg/dL (ref 0.0–1.2)
BUN / CREAT RATIO: 18 (ref 12–28)
BUN: 14 mg/dL (ref 8–27)
CO2: 25 mmol/L (ref 18–29)
Calcium: 10.1 mg/dL (ref 8.7–10.3)
Chloride: 96 mmol/L (ref 96–106)
Creatinine, Ser: 0.77 mg/dL (ref 0.57–1.00)
GFR calc non Af Amer: 82 mL/min/{1.73_m2} (ref >59)
GFR, EST AFRICAN AMERICAN: 95 mL/min/{1.73_m2} (ref >59)
GLOBULIN, TOTAL: 2.8 g/dL (ref 1.5–4.5)
Glucose: 109 mg/dL — ABNORMAL HIGH (ref 65–99)
POTASSIUM: 4 mmol/L (ref 3.5–5.2)
SODIUM: 139 mmol/L (ref 134–144)
TOTAL PROTEIN: 7.4 g/dL (ref 6.0–8.5)

## 2016-04-21 LAB — LIPID PANEL
CHOLESTEROL TOTAL: 180 mg/dL (ref 100–199)
Chol/HDL Ratio: 5.6 ratio units — ABNORMAL HIGH (ref 0.0–4.4)
HDL: 32 mg/dL — ABNORMAL LOW (ref >39)
LDL CALC: 77 mg/dL (ref 0–99)
TRIGLYCERIDES: 353 mg/dL — AB (ref 0–149)
VLDL Cholesterol Cal: 71 mg/dL — ABNORMAL HIGH (ref 5–40)

## 2016-04-21 LAB — VITAMIN D 25 HYDROXY (VIT D DEFICIENCY, FRACTURES): Vit D, 25-Hydroxy: 52.7 ng/mL (ref 30.0–100.0)

## 2016-04-21 LAB — TSH: TSH: 1.39 u[IU]/mL (ref 0.450–4.500)

## 2016-04-26 ENCOUNTER — Encounter: Payer: Self-pay | Admitting: Oncology

## 2016-04-26 ENCOUNTER — Other Ambulatory Visit: Payer: Self-pay | Admitting: *Deleted

## 2016-04-26 ENCOUNTER — Telehealth: Payer: Self-pay

## 2016-04-26 DIAGNOSIS — C50912 Malignant neoplasm of unspecified site of left female breast: Secondary | ICD-10-CM

## 2016-04-26 MED ORDER — TAMOXIFEN CITRATE 20 MG PO TABS: 20.0000 mg | ORAL_TABLET | Freq: Every day | ORAL | Status: DC

## 2016-04-26 NOTE — Telephone Encounter (Signed)
Does not feel well on medication.  May consider stopping. Working on her diet and exercise.  Thanks.

## 2016-04-26 NOTE — Telephone Encounter (Signed)
-----   Message from [name], MD sent at 04/23/2016  4:27 PM EDT ----- Hgb elevated, probably related to smoking.  Stay well hydrated, work on smoking cessation and recheck in 6 weeks. Also, triglycerides elevated and one liver enzyme, please see if patient would consider increase in cholesterol medication and recheck in 6 weeks. Thanks.

## 2016-04-26 NOTE — Telephone Encounter (Signed)
Advised patient of results. Patient reports that she has been taking Exemestane 25mg  daily for about 1-2 months (prescribed by Dr. Jeb Levering), and it has caused her a lot of health problems. She reports that she has had trouble with her blood sugar, blood pressure, and wonders if it has adjusted her liver functions and cholesterol levels? Patient does not want to increase cholesterol medication because she is afraid it will cause more problems. She reports that she has muscle aches, fatigue, insomnia, and headaches. She does not know which med is causing these issues, but she knows she was fine before starting her maintenance medication.   Patient is uneasy about who will continue her care. She reports that you and Dr. Jeb Levering are leaving and does not know what to do going forward. She reports that she has so much going on at the moment.  I reassured patient that we have providers here that will be glad to continue her care, and that if she had any questions to schedule and appt and talk with you about her concerns. Patient does have a routine check up with Grace Bushy, PA in August.

## 2016-05-12 ENCOUNTER — Other Ambulatory Visit: Payer: Self-pay | Admitting: Physician Assistant

## 2016-05-12 DIAGNOSIS — Z1231 Encounter for screening mammogram for malignant neoplasm of breast: Secondary | ICD-10-CM

## 2016-05-24 ENCOUNTER — Other Ambulatory Visit: Payer: Self-pay | Admitting: Physician Assistant

## 2016-05-24 ENCOUNTER — Ambulatory Visit
Admission: RE | Admit: 2016-05-24 | Discharge: 2016-05-24 | Disposition: A | Discharge status: Home / Self Care | Payer: BLUE CROSS/BLUE SHIELD | Source: Ambulatory Visit | Attending: Physician Assistant | Admitting: Physician Assistant

## 2016-05-24 ENCOUNTER — Telehealth: Payer: Self-pay

## 2016-05-24 DIAGNOSIS — Z1231 Encounter for screening mammogram for malignant neoplasm of breast: Secondary | ICD-10-CM

## 2016-05-24 NOTE — Telephone Encounter (Signed)
-----   Message from [name], [place] sent at 05/24/2016 12:40 PM EDT ----- Normal mammogram. Repeat screening in one year.

## 2016-05-24 NOTE — Telephone Encounter (Signed)
Patient advised as directed below.  Thanks,  -Bracen Schum 

## 2016-06-02 ENCOUNTER — Other Ambulatory Visit: Payer: Self-pay

## 2016-06-02 DIAGNOSIS — E1129 Type 2 diabetes mellitus with other diabetic kidney complication: Secondary | ICD-10-CM

## 2016-06-02 MED ORDER — METFORMIN HCL 500 MG PO TABS: 500.0000 mg | ORAL_TABLET | Freq: Two times a day (BID) | ORAL | Status: DC

## 2016-06-08 ENCOUNTER — Other Ambulatory Visit: Payer: Self-pay | Admitting: *Deleted

## 2016-06-08 ENCOUNTER — Ambulatory Visit: Payer: BLUE CROSS/BLUE SHIELD | Admitting: Family Medicine

## 2016-06-08 DIAGNOSIS — C50912 Malignant neoplasm of unspecified site of left female breast: Secondary | ICD-10-CM

## 2016-06-08 MED ORDER — TAMOXIFEN CITRATE 20 MG PO TABS: 20.0000 mg | ORAL_TABLET | Freq: Every day | ORAL | Status: DC

## 2016-07-20 ENCOUNTER — Encounter: Payer: Self-pay | Admitting: General Surgery

## 2016-07-20 ENCOUNTER — Ambulatory Visit (INDEPENDENT_AMBULATORY_CARE_PROVIDER_SITE_OTHER): Payer: BLUE CROSS/BLUE SHIELD | Admitting: General Surgery

## 2016-07-20 VITALS — BP 132/74 | HR 72 | Resp 12 | Ht 60.0 in | Wt 177.0 lb

## 2016-07-20 DIAGNOSIS — C50912 Malignant neoplasm of unspecified site of left female breast: Secondary | ICD-10-CM | POA: Diagnosis not present

## 2016-07-20 NOTE — Progress Notes (Signed)
Patient ID: Heather Burke, female   DOB: [birth year], 65 y.o.   MRN: [id number]  Chief Complaint  Patient presents with  . Follow-up    breast cancer    HPI Heather Burke is a 65 y.o. female here today for her follow up breast cancer. Patient had a right breast mammogram on 05/24/16.   The patient had been followed by Dr. Oliva Bustard. She reported numbness and tingling in her fingers with left resolve and was changed to tamoxifen earlier this year. Her neurologic symptoms have improved although not completely resolved. She would like not to be taking antiestrogen medication. HPI  Past Medical History:  Diagnosis Date  . Cancer ([place]) 06/08/2015   Mammoprint: Low risk,T1b, N0;  ER+,PR+, Her 2 neg (FISH neg) x2.  Marland Kitchen GERD (gastroesophageal reflux disease)   . Heart murmur 2016   newly diagnosed, slight murmur  . Hyperlipidemia   . Hypertension   . Hypothyroidism   . Lupus ([place])   . PONV (postoperative nausea and vomiting)    with Hysterectomy  . Thyroid disease     Past Surgical History:  Procedure Laterality Date  . ABDOMINAL HYSTERECTOMY    . BREAST BIOPSY Right 2002   negative/ Dr. Bary Castilla  . BREAST BIOPSY Left 06-08-15   INVASIVE MAMMARY CARCINOMA WITH PARTIAL SOLID PATTERN.   . core biopsy Left 06/08/15  . GUM SURGERY  2006  . MASTECTOMY Left   . SENTINEL NODE BIOPSY Left 08/18/2015   Procedure: SENTINEL NODE BIOPSY;  Surgeon: [name], MD;  Location: [facility] ORS;  Service: General;  Laterality: Left;  . SIMPLE MASTECTOMY WITH AXILLARY SENTINEL NODE BIOPSY Left 08/18/2015   Procedure: SIMPLE MASTECTOMY;  Surgeon: [name], MD;  Location: [facility] ORS;  Service: General;  Laterality: Left;  . TONSILLECTOMY      Family History  Problem Relation Age of Onset  . Cancer Father     prostate  . Thyroid disease Daughter   . Heart attack Sister   . Breast cancer Cousin     Social History Social History  Substance Use Topics  . Smoking status: Current Every Day  Smoker    Packs/day: 2.00    Years: 44.00    Types: Cigarettes  . Smokeless tobacco: Never Used  . Alcohol use 0.0 oz/week     Comment: 3-4 beers/day    Allergies  Allergen Reactions  . Atorvastatin Nausea And Vomiting  . Lisinopril Cough  . Pravastatin Nausea And Vomiting    Current Outpatient Prescriptions  Medication Sig Dispense Refill  . aspirin 81 MG tablet Take 81 mg by mouth daily.    . calcium carbonate (OS-CAL) 600 MG TABS tablet Take 1 tablet (600 mg total) by mouth 2 (two) times daily with a meal. 60 tablet   . Cholecalciferol (D3 ADULT PO) Take 1 tablet by mouth daily.     Marland Kitchen FIBER PO Take by mouth.    . fluticasone (FLONASE) 50 MCG/ACT nasal spray Place 2 sprays into both nostrils daily. 48 g 3  . losartan-hydrochlorothiazide (HYZAAR) 100-25 MG tablet Take 1 tablet by mouth daily. 90 tablet 3  . lovastatin (MEVACOR) 20 MG tablet Take 1 tablet (20 mg total) by mouth at bedtime. 90 tablet 1  . metFORMIN (GLUCOPHAGE) 500 MG tablet Take 1 tablet (500 mg total) by mouth 2 (two) times daily with a meal. 180 tablet 1  . Omega-3 Fatty Acids (FISH OIL) 1200 MG CAPS Take by mouth daily.    Marland Kitchen SYNTHROID  88 MCG tablet Take 1 tablet (88 mcg total) by mouth daily before breakfast. 90 tablet 3  . tamoxifen (NOLVADEX) 20 MG tablet Take 1 tablet (20 mg total) by mouth daily. 90 tablet 1   No current facility-administered medications for this visit.     Review of Systems Review of Systems  Constitutional: Negative.   Respiratory: Negative.   Cardiovascular: Negative.     Blood pressure 132/74, pulse 72, resp. rate 12, height 5' (1.524 m), weight 177 lb (80.3 kg).  Physical Exam Physical Exam  Constitutional: She is oriented to person, place, and time. She appears well-developed and well-nourished.  Eyes: Conjunctivae are normal.  Neck: Neck supple.  Cardiovascular: Normal rate, regular rhythm and normal heart sounds.   Pulmonary/Chest: Effort normal and breath sounds normal.  Right breast exhibits no inverted nipple, no mass, no nipple discharge, no skin change and no tenderness.    Left mastectomy site is clean and well healed. Skin cyst at drain site. Right breast 1 cm sebaceous cyst.   Abdominal: Soft. Bowel sounds are normal. There is no tenderness.  Lymphadenopathy:    She has no cervical adenopathy.    She has no axillary adenopathy.  Neurological: She is alert and oriented to person, place, and time.  Skin: Skin is warm and dry.    Data Reviewed Right breast screening mammogram dated 05/24/2016 reviewed. No interval change. Stable nodule in the upper-outer quadrant, previously biopsied showing evidence of a sebaceous cyst. BI-RADS-1.  Assessment    Benign breast exam.      Plan    The importance of antiestrogen therapy in light of her hormone receptor positive tumor and to achieve the favorable results predicted by her Mammoprint assay, ongoing antiestrogen therapy is needed.  The patient will be following up with Dr. Mike Gip in the future now the Dr. Oliva Bustard is stepping into the clinical pathways position.    Patient to return in one year after mammogram.  This information has been scribed by Gaspar Cola CMA.   [name] 07/20/2016, 8:47 PM

## 2016-07-20 NOTE — Patient Instructions (Signed)
Patient to return  

## 2016-08-03 ENCOUNTER — Encounter: Payer: Self-pay | Admitting: Physician Assistant

## 2016-08-03 ENCOUNTER — Ambulatory Visit (INDEPENDENT_AMBULATORY_CARE_PROVIDER_SITE_OTHER): Payer: BLUE CROSS/BLUE SHIELD | Admitting: Physician Assistant

## 2016-08-03 VITALS — BP 160/100 | HR 88 | Temp 98.0°F | Resp 16 | Wt 180.0 lb

## 2016-08-03 DIAGNOSIS — E1129 Type 2 diabetes mellitus with other diabetic kidney complication: Secondary | ICD-10-CM

## 2016-08-03 DIAGNOSIS — Z1211 Encounter for screening for malignant neoplasm of colon: Secondary | ICD-10-CM

## 2016-08-03 DIAGNOSIS — E785 Hyperlipidemia, unspecified: Secondary | ICD-10-CM | POA: Diagnosis not present

## 2016-08-03 DIAGNOSIS — Z720 Tobacco use: Secondary | ICD-10-CM

## 2016-08-03 DIAGNOSIS — Z716 Tobacco abuse counseling: Secondary | ICD-10-CM

## 2016-08-03 DIAGNOSIS — I10 Essential (primary) hypertension: Secondary | ICD-10-CM

## 2016-08-03 LAB — POCT GLYCOSYLATED HEMOGLOBIN (HGB A1C)
Est. average glucose Bld gHb Est-mCnc: 126
HEMOGLOBIN A1C: 6

## 2016-08-03 MED ORDER — VARENICLINE TARTRATE 0.5 MG X 11 & 1 MG X 42 PO MISC: ORAL | 0 refills | Status: DC

## 2016-08-03 NOTE — Patient Instructions (Addendum)
Type 2 Diabetes Mellitus, Adult Type 2 diabetes mellitus, often simply referred to as type 2 diabetes, is a long-lasting (chronic) disease. In type 2 diabetes, the pancreas does not make enough insulin (a hormone), the cells are less responsive to the insulin that is made (insulin resistance), or both. Normally, insulin moves sugars from food into the tissue cells. The tissue cells use the sugars for energy. The lack of insulin or the lack of normal response to insulin causes excess sugars to build up in the blood instead of going into the tissue cells. As a result, high blood sugar (hyperglycemia) develops. The effect of high sugar (glucose) levels can cause many complications. Type 2 diabetes was also previously called adult-onset diabetes, but it can occur at any age.  RISK FACTORS  A person is predisposed to developing type 2 diabetes if someone in the family has the disease and also has one or more of the following primary risk factors:  Weight gain, or being overweight or obese.  An inactive lifestyle.  A history of consistently eating high-calorie foods. Maintaining a normal weight and regular physical activity can reduce the chance of developing type 2 diabetes. SYMPTOMS  A person with type 2 diabetes may not show symptoms initially. The symptoms of type 2 diabetes appear slowly. The symptoms include:  Increased thirst (polydipsia).  Increased urination (polyuria).  Increased urination during the night (nocturia).  Sudden or unexplained weight changes.  Frequent, recurring infections.  Tiredness (fatigue).  Weakness.  Vision changes, such as blurred vision.  Fruity smell to your breath.  Abdominal pain.  Nausea or vomiting.  Cuts or bruises which are slow to heal.  Tingling or numbness in the hands or feet.  An open skin wound (ulcer). DIAGNOSIS Type 2 diabetes is frequently not diagnosed until complications of diabetes are present. Type 2 diabetes is diagnosed  when symptoms or complications are present and when blood glucose levels are increased. Your blood glucose level may be checked by one or more of the following blood tests:  A fasting blood glucose test. You will not be allowed to eat for at least 8 hours before a blood sample is taken.  A random blood glucose test. Your blood glucose is checked at any time of the day regardless of when you ate.  A hemoglobin A1c blood glucose test. A hemoglobin A1c test provides information about blood glucose control over the previous 3 months.  An oral glucose tolerance test (OGTT). Your blood glucose is measured after you have not eaten (fasted) for 2 hours and then after you drink a glucose-containing beverage. TREATMENT   You may need to take insulin or diabetes medicine daily to keep blood glucose levels in the desired range.  If you use insulin, you may need to adjust the dosage depending on the carbohydrates that you eat with each meal or snack.  Lifestyle changes are recommended as part of your treatment. These may include:  Following an individualized diet plan developed by a nutritionist or dietitian.  Exercising daily. Your health care providers will set individualized treatment goals for you based on your age, your medicines, how long you have had diabetes, and any other medical conditions you have. Generally, the goal of treatment is to maintain the following blood glucose levels:  Before meals (preprandial): 80-130 mg/dL.  After meals (postprandial): below 180 mg/dL.  A1c: less than 6.5-7%. HOME CARE INSTRUCTIONS   Have your hemoglobin A1c level checked twice a year.  Perform daily blood glucose monitoring  as directed by your health care provider.  Monitor urine ketones when you are ill and as directed by your health care provider.  Take your diabetes medicine or insulin as directed by your health care provider to maintain your blood glucose levels in the desired range.  Never run  out of diabetes medicine or insulin. It is needed every day.  If you are using insulin, you may need to adjust the amount of insulin given based on your intake of carbohydrates. Carbohydrates can raise blood glucose levels but need to be included in your diet. Carbohydrates provide vitamins, minerals, and fiber which are an essential part of a healthy diet. Carbohydrates are found in fruits, vegetables, whole grains, dairy products, legumes, and foods containing added sugars.  Eat healthy foods. You should make an appointment to see a registered dietitian to help you create an eating plan that is right for you.  Lose weight if you are overweight.  Carry a medical alert card or wear your medical alert jewelry.  Carry a 15-gram carbohydrate snack with you at all times to treat low blood glucose (hypoglycemia). Some examples of 15-gram carbohydrate snacks include:  Glucose tablets, 3 or 4.  Glucose gel, 15-gram tube.  Raisins, 2 tablespoons (24 grams).  Jelly beans, 6.  Animal crackers, 8.  Regular pop, 4 ounces (120 mL).  Gummy treats, 9.  Recognize hypoglycemia. Hypoglycemia occurs with blood glucose levels of 70 mg/dL and below. The risk for hypoglycemia increases when fasting or skipping meals, during or after intense exercise, and during sleep. Hypoglycemia symptoms can include:  Tremors or shakes.  Decreased ability to concentrate.  Sweating.  Increased heart rate.  Headache.  Dry mouth.  Hunger.  Irritability.  Anxiety.  Restless sleep.  Altered speech or coordination.  Confusion.  Treat hypoglycemia promptly. If you are alert and able to safely swallow, follow the 15:15 rule:  Take 15-20 grams of rapid-acting glucose or carbohydrate. Rapid-acting options include glucose gel, glucose tablets, or 4 ounces (120 mL) of fruit juice, regular soda, or low-fat milk.  Check your blood glucose level 15 minutes after taking the glucose.  Take 15-20 grams more of  glucose if the repeat blood glucose level is still 70 mg/dL or below.  Eat a meal or snack within 1 hour once blood glucose levels return to normal.  Be alert to feeling very thirsty and urinating more frequently than usual, which are early signs of hyperglycemia. An early awareness of hyperglycemia allows for prompt treatment. Treat hyperglycemia as directed by your health care provider.  Engage in at least 150 minutes of moderate-intensity physical activity a week, spread over at least 3 days of the week or as directed by your health care provider. In addition, you should engage in resistance exercise at least 2 times a week or as directed by your health care provider. Try to spend no more than 90 minutes at one time inactive.  Adjust your medicine and food intake as needed if you start a new exercise or sport.  Follow your sick-day plan anytime you are unable to eat or drink as usual.  Do not use any tobacco products including cigarettes, chewing tobacco, or electronic cigarettes. If you need help quitting, ask your health care provider.  Limit alcohol intake to no more than 1 drink per day for nonpregnant women and 2 drinks per day for men. You should drink alcohol only when you are also eating food. Talk with your health care provider whether alcohol is safe   for you. Tell your health care provider if you drink alcohol several times a week.  Keep all follow-up visits as directed by your health care provider. This is important.  Schedule an eye exam soon after the diagnosis of type 2 diabetes and then annually.  Perform daily skin and foot care. Examine your skin and feet daily for cuts, bruises, redness, nail problems, bleeding, blisters, or sores. A foot exam by a health care provider should be done annually.  Brush your teeth and gums at least twice a day and floss at least once a day. Follow up with your dentist regularly.  Share your diabetes management plan with your workplace or  school.  Keep your immunizations up to date. It is recommended that you receive a flu (influenza) vaccine every year. It is also recommended that you receive a pneumonia (pneumococcal) vaccine. If you are 68 years of age or older and have never received a pneumonia vaccine, this vaccine may be given as a series of two separate shots. Ask your health care provider which additional vaccines may be recommended.  Learn to manage stress.  Obtain ongoing diabetes education and support as needed.  Participate in or seek rehabilitation as needed to maintain or improve independence and quality of life. Request a physical or occupational therapy referral if you are having foot or hand numbness, or difficulties with grooming, dressing, eating, or physical activity. SEEK MEDICAL CARE IF:   You are unable to eat food or drink fluids for more than 6 hours.  You have nausea and vomiting for more than 6 hours.  Your blood glucose level is over 240 mg/dL.  There is a change in mental status.  You develop an additional serious illness.  You have diarrhea for more than 6 hours.  You have been sick or have had a fever for a couple of days and are not getting better.  You have pain during any physical activity.  SEEK IMMEDIATE MEDICAL CARE IF:  You have difficulty breathing.  You have moderate to large ketone levels.   This information is not intended to replace advice given to you by your health care provider. Make sure you discuss any questions you have with your health care provider.   Document Released: 12/05/2005 Document Revised: 08/26/2015 Document Reviewed: 07/03/2012 Elsevier Interactive Patient Education 2016 [employer].  Smoking Cessation, Tips for Success If you are ready to quit smoking, congratulations! You have chosen to help yourself be healthier. Cigarettes bring nicotine, tar, carbon monoxide, and other irritants into your body. Your lungs, heart, and blood vessels will be able  to work better without these poisons. There are many different ways to quit smoking. Nicotine gum, nicotine patches, a nicotine inhaler, or nicotine nasal spray can help with physical craving. Hypnosis, support groups, and medicines help break the habit of smoking. WHAT THINGS CAN I DO TO MAKE QUITTING EASIER?  Here are some tips to help you quit for good:  Pick a date when you will quit smoking completely. Tell all of your friends and family about your plan to quit on that date.  Do not try to slowly cut down on the number of cigarettes you are smoking. Pick a quit date and quit smoking completely starting on that day.  Throw away all cigarettes.   Clean and remove all ashtrays from your home, work, and car.  On a card, write down your reasons for quitting. Carry the card with you and read it when you get the urge  to smoke.  Cleanse your body of nicotine. Drink enough water and fluids to keep your urine clear or pale yellow. Do this after quitting to flush the nicotine from your body.  Learn to predict your moods. Do not let a bad situation be your excuse to have a cigarette. Some situations in your life might tempt you into wanting a cigarette.  Never have "just one" cigarette. It leads to wanting another and another. Remind yourself of your decision to quit.  Change habits associated with smoking. If you smoked while driving or when feeling stressed, try other activities to replace smoking. Stand up when drinking your coffee. Brush your teeth after eating. Sit in a different chair when you read the paper. Avoid alcohol while trying to quit, and try to drink fewer caffeinated beverages. Alcohol and caffeine may urge you to smoke.  Avoid foods and drinks that can trigger a desire to smoke, such as sugary or spicy foods and alcohol.  Ask people who smoke not to smoke around you.  Have something planned to do right after eating or having a cup of coffee. For example, plan to take a walk or  exercise.  Try a relaxation exercise to calm you down and decrease your stress. Remember, you may be tense and nervous for the first 2 weeks after you quit, but this will pass.  Find new activities to keep your hands busy. Play with a pen, coin, or rubber band. Doodle or draw things on paper.  Brush your teeth right after eating. This will help cut down on the craving for the taste of tobacco after meals. You can also try mouthwash.   Use oral substitutes in place of cigarettes. Try using lemon drops, carrots, cinnamon sticks, or chewing gum. Keep them handy so they are available when you have the urge to smoke.  When you have the urge to smoke, try deep breathing.  Designate your home as a nonsmoking area.  If you are a heavy smoker, ask your health care provider about a prescription for nicotine chewing gum. It can ease your withdrawal from nicotine.  Reward yourself. Set aside the cigarette money you save and buy yourself something nice.  Look for support from others. Join a support group or smoking cessation program. Ask someone at home or at work to help you with your plan to quit smoking.  Always ask yourself, "Do I need this cigarette or is this just a reflex?" Tell yourself, "Today, I choose not to smoke," or "I do not want to smoke." You are reminding yourself of your decision to quit.  Do not replace cigarette smoking with electronic cigarettes (commonly called e-cigarettes). The safety of e-cigarettes is unknown, and some may contain harmful chemicals.  If you relapse, do not give up! Plan ahead and think about what you will do the next time you get the urge to smoke. HOW WILL I FEEL WHEN I QUIT SMOKING? You may have symptoms of withdrawal because your body is used to nicotine (the addictive substance in cigarettes). You may crave cigarettes, be irritable, feel very hungry, cough often, get headaches, or have difficulty concentrating. The withdrawal symptoms are only temporary.  They are strongest when you first quit but will go away within 10-14 days. When withdrawal symptoms occur, stay in control. Think about your reasons for quitting. Remind yourself that these are signs that your body is healing and getting used to being without cigarettes. Remember that withdrawal symptoms are easier to treat than the  major diseases that smoking can cause.  Even after the withdrawal is over, expect periodic urges to smoke. However, these cravings are generally short lived and will go away whether you smoke or not. Do not smoke! WHAT RESOURCES ARE AVAILABLE TO HELP ME QUIT SMOKING? Your health care provider can direct you to community resources or hospitals for support, which may include:  Group support.  Education.  Hypnosis.  Therapy.   This information is not intended to replace advice given to you by your health care provider. Make sure you discuss any questions you have with your health care provider.   Document Released: 09/02/2004 Document Revised: 12/26/2014 Document Reviewed: 05/23/2013 Elsevier Interactive Patient Education [employer].

## 2016-08-03 NOTE — Progress Notes (Signed)
Patient: Heather Burke Female    DOB: 1951/05/01   65 y.o.   MRN: [id number] Visit Date: 08/03/2016  Today's Provider: [name], PA-C   Chief Complaint  Patient presents with  . Follow-up    Hypertension, Diabetes, Choslesterol   Subjective:    HPI  Diabetes Mellitus Type II, Follow-up:   Lab Results  Component Value Date   HGBA1C 7.1 04/15/2016   HGBA1C 6.8 01/01/2016   HGBA1C 6.2 09/29/2015   Last seen for diabetes 4 months ago.  Management since then includes Metformin was added. She reports excellent compliance with treatment. She is not having side effects.  Current symptoms include polyuria and have been stable. Home blood sugar records: n/a  Episodes of hypoglycemia? no   Most Recent Eye Exam: couple of years ago. She needs to schedule. Weight trend: fluctuating a lot Prior visit with dietician: no Current diet: in general, a "healthy" diet   Current exercise: none  ------------------------------------------------------------------------   Hypertension, follow-up:  BP Readings from Last 3 Encounters:  08/03/16 (!) 160/100  07/20/16 132/74  04/15/16 (!) 160/90    She was last seen for hypertension 4 months ago.  BP at that visit was 160/90. Management since that visit includes none.She reports excellent compliance with treatment. She is not having side effects. She is not exercising. She is not adherent to low salt diet.   Outside blood pressures are n/a. She is experiencing none.  Patient denies chest pain, chest pressure/discomfort, exertional chest pressure/discomfort, irregular heart beat, lower extremity edema and palpitations.   Cardiovascular risk factors include diabetes mellitus, dyslipidemia, hypertension and smoking/ tobacco exposure.   ------------------------------------------------------------------------    Lipid/Cholesterol, Follow-up:   Last seen for this 4 months ago.  Management since that visit includes  None.  Last Lipid Panel:    Component Value Date/Time   CHOL 180 04/20/2016 0804   TRIG 353 (H) 04/20/2016 0804   HDL 32 (L) 04/20/2016 0804   CHOLHDL 5.6 (H) 04/20/2016 0804   LDLCALC 77 04/20/2016 0804    She reports excellent compliance with treatment. She is not having side effects.   Wt Readings from Last 3 Encounters:  08/03/16 180 lb (81.6 kg)  07/20/16 177 lb (80.3 kg)  04/15/16 179 lb (81.2 kg)    ------------------------------------------------------------------------ She reports that she takes her Tamoxifen for cancer treatment. She has cancer on her left breast.She reports that she is having side effect from it. She reports her hands hurt and feels hungry all the time. She is due to go back to the cancer center Sept. 9.    Allergies  Allergen Reactions  . Atorvastatin Nausea And Vomiting  . Lisinopril Cough  . Pravastatin Nausea And Vomiting   Current Meds  Medication Sig  . aspirin 81 MG tablet Take 81 mg by mouth daily.  . calcium carbonate (OS-CAL) 600 MG TABS tablet Take 1 tablet (600 mg total) by mouth 2 (two) times daily with a meal.  . Cholecalciferol (D3 ADULT PO) Take 1 tablet by mouth daily.   Marland Kitchen FIBER PO Take by mouth.  . fluticasone (FLONASE) 50 MCG/ACT nasal spray Place 2 sprays into both nostrils daily.  Marland Kitchen losartan-hydrochlorothiazide (HYZAAR) 100-25 MG tablet Take 1 tablet by mouth daily.  Marland Kitchen lovastatin (MEVACOR) 20 MG tablet Take 1 tablet (20 mg total) by mouth at bedtime.  . metFORMIN (GLUCOPHAGE) 500 MG tablet Take 1 tablet (500 mg total) by mouth 2 (two) times daily with a meal.  .  Omega-3 Fatty Acids (FISH OIL) 1200 MG CAPS Take by mouth daily.  Marland Kitchen SYNTHROID 88 MCG tablet Take 1 tablet (88 mcg total) by mouth daily before breakfast.  . tamoxifen (NOLVADEX) 20 MG tablet Take 1 tablet (20 mg total) by mouth daily.    Review of Systems  Constitutional: Positive for fatigue (from the "treatment").  Eyes: Negative for visual disturbance.    Respiratory: Negative for cough, chest tightness and shortness of breath.   Cardiovascular: Negative for chest pain, palpitations and leg swelling.  Gastrointestinal: Negative for abdominal pain and nausea.  Musculoskeletal: Positive for arthralgias (hands).  Neurological: Positive for weakness (hands) and numbness (hands). Negative for dizziness, light-headedness and headaches.  Psychiatric/Behavioral: Negative.     Social History  Substance Use Topics  . Smoking status: Current Every Day Smoker    Packs/day: 2.00    Years: 44.00    Types: Cigarettes  . Smokeless tobacco: Never Used  . Alcohol use 0.0 oz/week     Comment: 3-4 beers/day   Objective:   BP (!) 160/100 (BP Location: Right Arm, Patient Position: Sitting, Cuff Size: Normal)   Pulse 88   Temp 98 F (36.7 C) (Oral)   Resp 16   Wt 180 lb (81.6 kg)   SpO2 98%   BMI 35.15 kg/m   Physical Exam  Constitutional: She appears well-developed and well-nourished. No distress.  Neck: Normal range of motion. Neck supple. No JVD present. No tracheal deviation present. No thyromegaly present.  Cardiovascular: Normal rate, regular rhythm and normal heart sounds.  Exam reveals no gallop and no friction rub.   No murmur heard. Pulmonary/Chest: Effort normal and breath sounds normal. No respiratory distress. She has no wheezes. She has no rales.  Musculoskeletal: She exhibits no edema.  Lymphadenopathy:    She has no cervical adenopathy.  Skin: She is not diaphoretic.  Psychiatric: She has a normal mood and affect. Her behavior is normal. Judgment and thought content normal.  Vitals reviewed.     Assessment & Plan:     1. Type 2 diabetes mellitus with other diabetic kidney complication (HCC) [id number] much improved with metformin 500mg  BID. Now 6.0. Continue lifestyle modifications. Will recheck in 5 months per patient request instead of 3. Will check all labs at that time. - POCT glycosylated hemoglobin (Hb A1C)  2. Essential  hypertension Elevated today but has stress and anxiety with meeting new provider. Will check labs in 5 months. Continue Hyzaar.  3. Hyperlipemia Will check in 5 months per patient request. Continue lifestyle modification. Continue lovastatin.   4. Colon cancer screening Referred to Dr. Allen Norris for colonoscopy. Never had one.  - Ambulatory referral to Gastroenterology  5. Encounter for smoking cessation counseling Interested in stopping smoking. Reports she will try medication as below in one month. Has a vacation she doesn't want to try to quit prior to that. She will call if she does well and wants to continue. - varenicline (CHANTIX PAK) 0.5 MG X 11 & 1 MG X 42 tablet; Take one 0.5 mg tablet PO once daily x 3 days, then increase to one 0.5 mg tablet BID x 4 days, then increase to one 1 mg tablet BID  Dispense: 53 tablet; Refill: 0  6. Tobacco abuse counseling See above. - varenicline (CHANTIX PAK) 0.5 MG X 11 & 1 MG X 42 tablet; Take one 0.5 mg tablet PO once daily x 3 days, then increase to one 0.5 mg tablet BID x 4 days, then increase to one  1 mg tablet BID  Dispense: 53 tablet; Refill: 0       [name], PA-C  [facility]

## 2016-08-12 ENCOUNTER — Telehealth: Payer: Self-pay

## 2016-08-12 ENCOUNTER — Other Ambulatory Visit: Payer: Self-pay

## 2016-08-12 NOTE — Telephone Encounter (Signed)
Screening Colonoscopy Z12.11 [facility] [id number]  Please pre cert

## 2016-08-12 NOTE — Telephone Encounter (Signed)
Gastroenterology Pre-Procedure Review  Request Date: 09/23/2016 Requesting Physician: Dr. Venia Minks  PATIENT REVIEW QUESTIONS: The patient responded to the following health history questions as indicated:    1. Are you having any GI issues? no 2. Do you have a personal history of Polyps? no 3. Do you have a family history of Colon Cancer or Polyps? no 4. Diabetes Mellitus? yes (Type 2) 5. Joint replacements in the past 12 months?no 6. Major health problems in the past 3 months?no 7. Any artificial heart valves, MVP, or defibrillator?no    MEDICATIONS & ALLERGIES:    Patient reports the following regarding taking any anticoagulation/antiplatelet therapy:   Plavix, Coumadin, Eliquis, Xarelto, Lovenox, Pradaxa, Brilinta, or Effient? no Aspirin? yes (Heart health)  Patient confirms/reports the following medications:  Current Outpatient Prescriptions  Medication Sig Dispense Refill  . aspirin 81 MG tablet Take 81 mg by mouth daily.    . calcium carbonate (OS-CAL) 600 MG TABS tablet Take 1 tablet (600 mg total) by mouth 2 (two) times daily with a meal. 60 tablet   . Cholecalciferol (D3 ADULT PO) Take 1 tablet by mouth daily.     Marland Kitchen FIBER PO Take by mouth.    . fluticasone (FLONASE) 50 MCG/ACT nasal spray Place 2 sprays into both nostrils daily. 48 g 3  . losartan-hydrochlorothiazide (HYZAAR) 100-25 MG tablet Take 1 tablet by mouth daily. 90 tablet 3  . lovastatin (MEVACOR) 20 MG tablet Take 1 tablet (20 mg total) by mouth at bedtime. 90 tablet 1  . metFORMIN (GLUCOPHAGE) 500 MG tablet Take 1 tablet (500 mg total) by mouth 2 (two) times daily with a meal. 180 tablet 1  . Omega-3 Fatty Acids (FISH OIL) 1200 MG CAPS Take by mouth daily.    Marland Kitchen SYNTHROID 88 MCG tablet Take 1 tablet (88 mcg total) by mouth daily before breakfast. 90 tablet 3  . tamoxifen (NOLVADEX) 20 MG tablet Take 1 tablet (20 mg total) by mouth daily. 90 tablet 1  . varenicline (CHANTIX PAK) 0.5 MG X 11 & 1 MG X 42 tablet Take  one 0.5 mg tablet PO once daily x 3 days, then increase to one 0.5 mg tablet BID x 4 days, then increase to one 1 mg tablet BID 53 tablet 0   No current facility-administered medications for this visit.     Patient confirms/reports the following allergies:  Allergies  Allergen Reactions  . Atorvastatin Nausea And Vomiting  . Lisinopril Cough  . Pravastatin Nausea And Vomiting    No orders of the defined types were placed in this encounter.   AUTHORIZATION INFORMATION Primary Insurance: 1D#: Group #:  Secondary Insurance: 1D#: Group #:  SCHEDULE INFORMATION: Date: 09/23/2016 Time: Location: [facility]

## 2016-08-24 ENCOUNTER — Other Ambulatory Visit: Payer: Self-pay | Admitting: *Deleted

## 2016-08-24 DIAGNOSIS — Z853 Personal history of malignant neoplasm of breast: Secondary | ICD-10-CM

## 2016-08-25 ENCOUNTER — Inpatient Hospital Stay: Payer: BLUE CROSS/BLUE SHIELD | Attending: Hematology and Oncology

## 2016-08-25 ENCOUNTER — Other Ambulatory Visit: Payer: Self-pay

## 2016-08-25 ENCOUNTER — Inpatient Hospital Stay (HOSPITAL_BASED_OUTPATIENT_CLINIC_OR_DEPARTMENT_OTHER): Payer: BLUE CROSS/BLUE SHIELD | Admitting: Hematology and Oncology

## 2016-08-25 ENCOUNTER — Other Ambulatory Visit: Payer: Self-pay | Admitting: *Deleted

## 2016-08-25 ENCOUNTER — Telehealth: Payer: Self-pay | Admitting: *Deleted

## 2016-08-25 ENCOUNTER — Other Ambulatory Visit: Payer: Self-pay | Admitting: Hematology and Oncology

## 2016-08-25 VITALS — BP 178/100 | HR 80 | Temp 97.1°F | Resp 18 | Wt 178.1 lb

## 2016-08-25 DIAGNOSIS — Z79899 Other long term (current) drug therapy: Secondary | ICD-10-CM | POA: Insufficient documentation

## 2016-08-25 DIAGNOSIS — C50912 Malignant neoplasm of unspecified site of left female breast: Secondary | ICD-10-CM

## 2016-08-25 DIAGNOSIS — Z8041 Family history of malignant neoplasm of ovary: Secondary | ICD-10-CM

## 2016-08-25 DIAGNOSIS — Z79810 Long term (current) use of selective estrogen receptor modulators (SERMs): Secondary | ICD-10-CM | POA: Insufficient documentation

## 2016-08-25 DIAGNOSIS — E785 Hyperlipidemia, unspecified: Secondary | ICD-10-CM | POA: Diagnosis not present

## 2016-08-25 DIAGNOSIS — Z170 Estrogen receptor positive status [ER+]: Secondary | ICD-10-CM | POA: Diagnosis not present

## 2016-08-25 DIAGNOSIS — M329 Systemic lupus erythematosus, unspecified: Secondary | ICD-10-CM | POA: Insufficient documentation

## 2016-08-25 DIAGNOSIS — Z7984 Long term (current) use of oral hypoglycemic drugs: Secondary | ICD-10-CM | POA: Insufficient documentation

## 2016-08-25 DIAGNOSIS — E039 Hypothyroidism, unspecified: Secondary | ICD-10-CM | POA: Insufficient documentation

## 2016-08-25 DIAGNOSIS — I10 Essential (primary) hypertension: Secondary | ICD-10-CM | POA: Diagnosis not present

## 2016-08-25 DIAGNOSIS — Z803 Family history of malignant neoplasm of breast: Secondary | ICD-10-CM

## 2016-08-25 DIAGNOSIS — F17210 Nicotine dependence, cigarettes, uncomplicated: Secondary | ICD-10-CM | POA: Diagnosis not present

## 2016-08-25 DIAGNOSIS — Z9012 Acquired absence of left breast and nipple: Secondary | ICD-10-CM

## 2016-08-25 DIAGNOSIS — K219 Gastro-esophageal reflux disease without esophagitis: Secondary | ICD-10-CM | POA: Diagnosis not present

## 2016-08-25 DIAGNOSIS — Z7982 Long term (current) use of aspirin: Secondary | ICD-10-CM

## 2016-08-25 DIAGNOSIS — Z853 Personal history of malignant neoplasm of breast: Secondary | ICD-10-CM

## 2016-08-25 LAB — COMPREHENSIVE METABOLIC PANEL
ALT: 17 U/L (ref 14–54)
AST: 25 U/L (ref 15–41)
Albumin: 4 g/dL (ref 3.5–5.0)
Alkaline Phosphatase: 45 U/L (ref 38–126)
Anion gap: 9 (ref 5–15)
BUN: 12 mg/dL (ref 6–20)
CO2: 26 mmol/L (ref 22–32)
Calcium: 8.7 mg/dL — ABNORMAL LOW (ref 8.9–10.3)
Chloride: 99 mmol/L — ABNORMAL LOW (ref 101–111)
Creatinine, Ser: 0.75 mg/dL (ref 0.44–1.00)
GFR calc Af Amer: >60 mL/min (ref >60)
GFR calc non Af Amer: >60 mL/min (ref >60)
Glucose, Bld: 121 mg/dL — ABNORMAL HIGH (ref 65–99)
Potassium: 3.3 mmol/L — ABNORMAL LOW (ref 3.5–5.1)
Sodium: 134 mmol/L — ABNORMAL LOW (ref 135–145)
Total Bilirubin: 0.3 mg/dL (ref 0.3–1.2)
Total Protein: 7.2 g/dL (ref 6.5–8.1)

## 2016-08-25 LAB — CBC WITH DIFFERENTIAL/PLATELET
Basophils Absolute: 0 10*3/uL (ref 0–0.1)
Basophils Relative: 1 %
Eosinophils Absolute: 0.1 10*3/uL (ref 0–0.7)
Eosinophils Relative: 2 %
HCT: 40.3 % (ref 35.0–47.0)
Hemoglobin: 14.2 g/dL (ref 12.0–16.0)
Lymphocytes Relative: 30 %
Lymphs Abs: 2.1 10*3/uL (ref 1.0–3.6)
MCH: 32.5 pg (ref 26.0–34.0)
MCHC: 35.1 g/dL (ref 32.0–36.0)
MCV: 92.5 fL (ref 80.0–100.0)
Monocytes Absolute: 0.5 10*3/uL (ref 0.2–0.9)
Monocytes Relative: 7 %
Neutro Abs: 4.3 10*3/uL (ref 1.4–6.5)
Neutrophils Relative %: 60 %
Platelets: 210 10*3/uL (ref 150–440)
RBC: 4.36 MIL/uL (ref 3.80–5.20)
RDW: 13.7 % (ref 11.5–14.5)
WBC: 7 10*3/uL (ref 3.6–11.0)

## 2016-08-25 NOTE — Telephone Encounter (Signed)
Patient's BP elevated today. 180/106 HR 82.   Rechecked - 178/100  HR 80  Patient states she does not usually have a BP problem and states she will be fine once she leaves the office. She states she has clinical anxiety and has a lot going on in her personal life right now.  Patient asymptomatic.

## 2016-08-25 NOTE — Progress Notes (Signed)
[facility] day:  08/25/2016  Chief Complaint: Heather Burke is a 65 y.o. female with stage IA multi-focal left breast cancer who is seen for reassessment.  HPI:  The patient's breast cancer was discovered on mammogram. Several foci were present. Left breast biopsy on 06/08/2015 revealed invasive mammary carcinoma with DCIS at the 2:00 position and invasive lobular carcinoma with LCIS at the 11:30 position.  She underwent left mastectomy by Dr. Bary Castilla on 08/18/2015.  Pathology revealed three separate foci of lobular cancer (18 mm, 9 mm, 0.9 mm) and one focus of invasive mammary carcinoma (5 mm).  One lymph node was negative for malignancy.  Estrogen receptor and rogesterone receptor were positive.  HER-2/neu was negative.  Pathologic stage was T1cN0M0.  MammaPrint on 08/28/2015 revealed low risk luminal-type (A)  She started on letrozole in 08/2015.  She developed side effect with numbness in upper extremity (noticed more in right hand).  Letrozole was discontinued.  Numbness improved. She was then started on Aromasin in 02/2016 with similar symptoms. Tamoxifen was started in 03/2016 or 04/2016.  She notes that her hands still hurt when taking a child proof cap off.  She has hot flashes.  Bone density on 01/07/2016 was normal with a T-score of -0.2 in the right femoral neck. L-4 was excluded due to degenerative disease.  She is on calcium.  Right sided mammogram on 05/24/2016 revealed no evidence of malignancy.  She has a colonoscopy planned on 09/20/2016.  Symptomatically, she is doing well. She voices no concerns.  Past Medical History:  Diagnosis Date  . Cancer ([place]) 06/08/2015   Mammoprint: Low risk,T1b, N0;  ER+,PR+, Her 2 neg (FISH neg) x2.  Marland Kitchen GERD (gastroesophageal reflux disease)   . Heart murmur 2016   newly diagnosed, slight murmur  . Hyperlipidemia   . Hypertension   . Hypothyroidism   . Lupus ([place])   . PONV (postoperative nausea  and vomiting)    with Hysterectomy  . Thyroid disease     Past Surgical History:  Procedure Laterality Date  . ABDOMINAL HYSTERECTOMY    . BREAST BIOPSY Right 2002   negative/ Dr. Bary Castilla  . BREAST BIOPSY Left 06-08-15   INVASIVE MAMMARY CARCINOMA WITH PARTIAL SOLID PATTERN.   . core biopsy Left 06/08/15  . GUM SURGERY  2006  . MASTECTOMY Left   . SENTINEL NODE BIOPSY Left 08/18/2015   Procedure: SENTINEL NODE BIOPSY;  Surgeon: [name], MD;  Location: [facility] ORS;  Service: General;  Laterality: Left;  . SIMPLE MASTECTOMY WITH AXILLARY SENTINEL NODE BIOPSY Left 08/18/2015   Procedure: SIMPLE MASTECTOMY;  Surgeon: [name], MD;  Location: [facility] ORS;  Service: General;  Laterality: Left;  . TONSILLECTOMY      Family History  Problem Relation Age of Onset  . Cancer Father     prostate  . Thyroid disease Daughter   . Heart attack Sister   . Breast cancer Cousin     Social History:  reports that she has been smoking Cigarettes.  She has a 88.00 pack-year smoking history. She has never used smokeless tobacco. She reports that she drinks alcohol. She reports that she does not use drugs.  She has smoked 2 packs per day since age 66.  She plans on taking Chantix.  Her niece is J.  Weaver.  The patient is alone today.  Allergies:  Allergies  Allergen Reactions  . Atorvastatin Nausea And Vomiting  . Lisinopril Cough  .  Pravastatin Nausea And Vomiting    Current Medications: Current Outpatient Prescriptions  Medication Sig Dispense Refill  . aspirin 81 MG tablet Take 81 mg by mouth daily.    . calcium carbonate (OS-CAL) 600 MG TABS tablet Take 1 tablet (600 mg total) by mouth 2 (two) times daily with a meal. 60 tablet   . Cholecalciferol (D3 ADULT PO) Take 1 tablet by mouth daily.     Marland Kitchen FIBER PO Take by mouth.    . fluticasone (FLONASE) 50 MCG/ACT nasal spray Place 2 sprays into both nostrils daily. 48 g 3  . losartan-hydrochlorothiazide (HYZAAR) 100-25 MG tablet Take  1 tablet by mouth daily. 90 tablet 3  . lovastatin (MEVACOR) 20 MG tablet Take 1 tablet (20 mg total) by mouth at bedtime. 90 tablet 1  . metFORMIN (GLUCOPHAGE) 500 MG tablet Take 1 tablet (500 mg total) by mouth 2 (two) times daily with a meal. 180 tablet 1  . Omega-3 Fatty Acids (FISH OIL) 1200 MG CAPS Take by mouth daily.    Marland Kitchen SYNTHROID 88 MCG tablet Take 1 tablet (88 mcg total) by mouth daily before breakfast. 90 tablet 3  . tamoxifen (NOLVADEX) 20 MG tablet Take 1 tablet (20 mg total) by mouth daily. 90 tablet 1  . varenicline (CHANTIX PAK) 0.5 MG X 11 & 1 MG X 42 tablet Take one 0.5 mg tablet PO once daily x 3 days, then increase to one 0.5 mg tablet BID x 4 days, then increase to one 1 mg tablet BID 53 tablet 0   No current facility-administered medications for this visit.     Review of Systems:  GENERAL:  Feels good.  Active.  No fevers, sweats or weight loss. PERFORMANCE STATUS (ECOG): 0 HEENT:  No visual changes, runny nose, sore throat, mouth sores or tenderness. Lungs: No shortness of breath or cough.  No hemoptysis. Cardiac:  No chest pain, palpitations, orthopnea, or PND. GI:  No nausea, vomiting, diarrhea, constipation, melena or hematochezia. GU:  No urgency, frequency, dysuria, or hematuria. Musculoskeletal:  No back pain.  No joint pain.  No muscle tenderness. Extremities:  No pain or swelling. Skin:  No rashes or skin changes. Neuro:  No headache, numbness or weakness, balance or coordination issues. Endocrine:  Diabetes.  Thyroid disease on Synthroid. Hot  Flashes.   No night sweats. Psych:  No mood changes, depression or anxiety. Pain:  No focal pain. Review of systems:  All other systems reviewed and found to be negative.  Physical Exam: Blood pressure (!) 180/106, pulse 82, temperature 97.1 F (36.2 C), temperature source Tympanic, resp. rate 18, weight 178 lb 2.1 oz (80.8 kg). GENERAL:  Well developed, well nourished, woman sitting comfortably in the exam room  in no acute distress. MENTAL STATUS:  Alert and oriented to person, place and time. HEAD:  Shoulder length brown hair with highlights.  Normocephalic, atraumatic, face symmetric, no Cushingoid features. EYES:  Blue eyes.  Pupils equal round and reactive to light and accomodation.  No conjunctivitis or scleral icterus. ENT:  Oropharynx clear without lesion.  Tongue normal. Mucous membranes moist.  RESPIRATORY:  Clear to auscultation without rales, wheezes or rhonchi. CARDIOVASCULAR:  Regular rate and rhythm without murmur, rub or gallop. BREAST:  Right breast with inferior fibrocystic changes.  No masses, skin changes or nipple discharge.  S/p left mastectomy.  No erythema or nodularity.  ABDOMEN:  Soft, non-tender, with active bowel sounds, and no hepatosplenomegaly.  No masses. SKIN:  No rashes, ulcers or  lesions. EXTREMITIES: No edema, no skin discoloration or tenderness.  No palpable cords. LYMPH NODES: No palpable cervical, supraclavicular, axillary or inguinal adenopathy  NEUROLOGICAL: Unremarkable. PSYCH:  Appropriate.   Orders Only on 08/25/2016  Component Date Value Ref Range Status  . WBC 08/25/2016 7.0  3.6 - 11.0 K/uL Final  . RBC 08/25/2016 4.36  3.80 - 5.20 MIL/uL Final  . Hemoglobin 08/25/2016 14.2  12.0 - 16.0 g/dL Final  . HCT 08/25/2016 40.3  35.0 - 47.0 % Final  . MCV 08/25/2016 92.5  80.0 - 100.0 fL Final  . MCH 08/25/2016 32.5  26.0 - 34.0 pg Final  . MCHC 08/25/2016 35.1  32.0 - 36.0 g/dL Final  . RDW 08/25/2016 13.7  11.5 - 14.5 % Final  . Platelets 08/25/2016 210  150 - 440 K/uL Final  . Neutrophils Relative % 08/25/2016 60  % Final  . Neutro Abs 08/25/2016 4.3  1.4 - 6.5 K/uL Final  . Lymphocytes Relative 08/25/2016 30  % Final  . Lymphs Abs 08/25/2016 2.1  1.0 - 3.6 K/uL Final  . Monocytes Relative 08/25/2016 7  % Final  . Monocytes Absolute 08/25/2016 0.5  0.2 - 0.9 K/uL Final  . Eosinophils Relative 08/25/2016 2  % Final  . Eosinophils Absolute 08/25/2016  0.1  0 - 0.7 K/uL Final  . Basophils Relative 08/25/2016 1  % Final  . Basophils Absolute 08/25/2016 0.0  0 - 0.1 K/uL Final  . Sodium 08/25/2016 134* 135 - 145 mmol/L Final  . Potassium 08/25/2016 3.3* 3.5 - 5.1 mmol/L Final  . Chloride 08/25/2016 99* 101 - 111 mmol/L Final  . CO2 08/25/2016 26  22 - 32 mmol/L Final  . Glucose, Bld 08/25/2016 121* 65 - 99 mg/dL Final  . BUN 08/25/2016 12  6 - 20 mg/dL Final  . Creatinine, Ser 08/25/2016 0.75  0.44 - 1.00 mg/dL Final  . Calcium 08/25/2016 8.7* 8.9 - 10.3 mg/dL Final  . Total Protein 08/25/2016 7.2  6.5 - 8.1 g/dL Final  . Albumin 08/25/2016 4.0  3.5 - 5.0 g/dL Final  . AST 08/25/2016 25  15 - 41 U/L Final  . ALT 08/25/2016 17  14 - 54 U/L Final  . Alkaline Phosphatase 08/25/2016 45  38 - 126 U/L Final  . Total Bilirubin 08/25/2016 0.3  0.3 - 1.2 mg/dL Final  . GFR calc non Af Amer 08/25/2016 >60  >60 mL/min Final  . GFR calc Af Amer 08/25/2016 >60  >60 mL/min Final   Comment: (NOTE) The eGFR has been calculated using the CKD EPI equation. This calculation has not been validated in all clinical situations. eGFR's persistently <60 mL/min signify possible Chronic Kidney Disease.   . Anion gap 08/25/2016 9  5 - 15 Final    Assessment:  Heather Burke is a 65 y.o. female with stage IA multi-focal left breast cancer s/p left mastectomy on 08/18/2015.  Pathology revealed three separate foci of lobular cancer (18 mm, 9 mm, 0.9 mm) and one focus of invasive mammary carcinoma (5 mm).  One lymph node was negative for malignancy.  ER and PR receptors were positive.  HER-2/neu was negative.  Pathologic stage was T1cN0M0.  MammaPrint on 08/28/2015 revealed low risk luminal-type (A).  CA27.29 was 30.2 on 08/25/2016.  She was on letrozole (08/2015) then Aromasin (02/2016).  She developed numbness in upper extremity (noticed more in right hand). Tamoxifen was started in 03/2016 or 04/2016.  She notes that her hands still hurt when taking a child  proof cap off.  She has hot flashes.  Bone density on 01/07/2016 was normal with a T-score of -0.2 in the right femoral neck.  L-4 was excluded due to degenerative disease.  She is on calcium.  Right sided mammogram on 05/24/2016 revealed no evidence of malignancy.  She has a colonoscopy planned on 09/20/2016.  Symptomatically, she is doing well.  She smokes.  Exam is unremarkable.  Plan: 1.  Discuss her medical history, diagnosis and management of breast cancer. Discuss MammaPrint testing with low risk score.  Discuss the benefit of hormonal therapy. Discus her experience with aromatase inhibitors. She appears to be doing well on tamoxifen. She will continue tamoxifen daily. 2.  Labs today: CBC with diff, CMP, CA27.29.  3.  Discuss calcium and vitamin D. 4.  Encourage smoking cessation. 5.  RTC in 4 months for MD assessment and labs (CBC with diff, CMP, CA27.29).   Lequita Asal, MD  08/25/2016, 10:28 AM

## 2016-08-26 LAB — CANCER ANTIGEN 27.29: CA 27.29: 30.2 U/mL (ref 0.0–38.6)

## 2016-08-30 ENCOUNTER — Other Ambulatory Visit: Payer: Self-pay

## 2016-09-04 ENCOUNTER — Encounter: Payer: Self-pay | Admitting: Hematology and Oncology

## 2016-09-13 ENCOUNTER — Encounter: Payer: Self-pay | Admitting: Anesthesiology

## 2016-09-13 ENCOUNTER — Ambulatory Visit
Admission: RE | Admit: 2016-09-13 | Discharge: 2016-09-13 | Disposition: A | Discharge status: Home / Self Care | Payer: BLUE CROSS/BLUE SHIELD | Source: Ambulatory Visit | Attending: Gastroenterology | Admitting: Gastroenterology

## 2016-09-13 ENCOUNTER — Ambulatory Visit: Payer: BLUE CROSS/BLUE SHIELD | Admitting: Anesthesiology

## 2016-09-13 ENCOUNTER — Encounter
Admission: RE | Disposition: A | Discharge status: Home / Self Care | Payer: Self-pay | Source: Ambulatory Visit | Attending: Gastroenterology

## 2016-09-13 DIAGNOSIS — Z1211 Encounter for screening for malignant neoplasm of colon: Secondary | ICD-10-CM | POA: Diagnosis not present

## 2016-09-13 DIAGNOSIS — E785 Hyperlipidemia, unspecified: Secondary | ICD-10-CM | POA: Diagnosis not present

## 2016-09-13 DIAGNOSIS — D125 Benign neoplasm of sigmoid colon: Secondary | ICD-10-CM | POA: Insufficient documentation

## 2016-09-13 DIAGNOSIS — Z7982 Long term (current) use of aspirin: Secondary | ICD-10-CM | POA: Insufficient documentation

## 2016-09-13 DIAGNOSIS — K219 Gastro-esophageal reflux disease without esophagitis: Secondary | ICD-10-CM | POA: Diagnosis not present

## 2016-09-13 DIAGNOSIS — Z8349 Family history of other endocrine, nutritional and metabolic diseases: Secondary | ICD-10-CM | POA: Diagnosis not present

## 2016-09-13 DIAGNOSIS — Z7984 Long term (current) use of oral hypoglycemic drugs: Secondary | ICD-10-CM | POA: Diagnosis not present

## 2016-09-13 DIAGNOSIS — E039 Hypothyroidism, unspecified: Secondary | ICD-10-CM | POA: Diagnosis not present

## 2016-09-13 DIAGNOSIS — Z888 Allergy status to other drugs, medicaments and biological substances status: Secondary | ICD-10-CM | POA: Insufficient documentation

## 2016-09-13 DIAGNOSIS — Z803 Family history of malignant neoplasm of breast: Secondary | ICD-10-CM | POA: Insufficient documentation

## 2016-09-13 DIAGNOSIS — M329 Systemic lupus erythematosus, unspecified: Secondary | ICD-10-CM | POA: Insufficient documentation

## 2016-09-13 DIAGNOSIS — I10 Essential (primary) hypertension: Secondary | ICD-10-CM | POA: Diagnosis not present

## 2016-09-13 DIAGNOSIS — Z8042 Family history of malignant neoplasm of prostate: Secondary | ICD-10-CM | POA: Insufficient documentation

## 2016-09-13 DIAGNOSIS — K5730 Diverticulosis of large intestine without perforation or abscess without bleeding: Secondary | ICD-10-CM | POA: Diagnosis not present

## 2016-09-13 DIAGNOSIS — R011 Cardiac murmur, unspecified: Secondary | ICD-10-CM | POA: Diagnosis not present

## 2016-09-13 DIAGNOSIS — Z8249 Family history of ischemic heart disease and other diseases of the circulatory system: Secondary | ICD-10-CM | POA: Insufficient documentation

## 2016-09-13 DIAGNOSIS — F17210 Nicotine dependence, cigarettes, uncomplicated: Secondary | ICD-10-CM | POA: Insufficient documentation

## 2016-09-13 DIAGNOSIS — Z79899 Other long term (current) drug therapy: Secondary | ICD-10-CM | POA: Diagnosis not present

## 2016-09-13 HISTORY — PX: COLONOSCOPY WITH PROPOFOL: SHX5780

## 2016-09-13 SURGERY — COLONOSCOPY WITH PROPOFOL
Anesthesia: General

## 2016-09-13 MED ORDER — SODIUM CHLORIDE 0.9 % IV SOLN
INTRAVENOUS | Status: DC
  Administered 2016-09-13: 10:00:00 via INTRAVENOUS

## 2016-09-13 MED ORDER — PROPOFOL 10 MG/ML IV BOLUS
INTRAVENOUS | Status: DC | PRN
  Administered 2016-09-13: 100 mg via INTRAVENOUS

## 2016-09-13 MED ORDER — PHENYLEPHRINE HCL 10 MG/ML IJ SOLN
INTRAMUSCULAR | Status: DC | PRN
  Administered 2016-09-13: 100 ug via INTRAVENOUS

## 2016-09-13 MED ORDER — MIDAZOLAM HCL 5 MG/5ML IJ SOLN
INTRAMUSCULAR | Status: DC | PRN
  Administered 2016-09-13: 1 mg via INTRAVENOUS

## 2016-09-13 MED ORDER — PROPOFOL 500 MG/50ML IV EMUL
INTRAVENOUS | Status: DC | PRN
  Administered 2016-09-13: 160 ug/kg/min via INTRAVENOUS

## 2016-09-13 MED ORDER — FENTANYL CITRATE (PF) 100 MCG/2ML IJ SOLN
INTRAMUSCULAR | Status: DC | PRN
  Administered 2016-09-13: 50 ug via INTRAVENOUS

## 2016-09-13 MED ORDER — LIDOCAINE 2% (20 MG/ML) 5 ML SYRINGE
INTRAMUSCULAR | Status: DC | PRN
  Administered 2016-09-13: 40 mg via INTRAVENOUS

## 2016-09-13 NOTE — Anesthesia Preprocedure Evaluation (Signed)
Anesthesia Evaluation  Patient identified by MRN, date of birth, ID band Patient awake    Reviewed: Allergy & Precautions, NPO status , Patient's Chart, lab work & pertinent test results  History of Anesthesia Complications (+) PONV and history of anesthetic complications  Airway Mallampati: III  TM Distance: <3 FB Neck ROM: Full   Comment: Small mouth Dental  (+) Caps   Pulmonary Current Smoker,    Pulmonary exam normal        Cardiovascular hypertension, Pt. on medications Normal cardiovascular exam+ Valvular Problems/Murmurs      Neuro/Psych Anxiety negative neurological ROS  negative psych ROS   GI/Hepatic negative GI ROS, Neg liver ROS, GERD  Medicated,  Endo/Other  diabetes, Well Controlled, Type 2Hypothyroidism   Renal/GU negative Renal ROS  negative genitourinary   Musculoskeletal negative musculoskeletal ROS (+)   Abdominal Normal abdominal exam  (+)   Peds negative pediatric ROS (+)  Hematology negative hematology ROS (+)   Anesthesia Other Findings   Reproductive/Obstetrics                             Anesthesia Physical  Anesthesia Plan  ASA: III  Anesthesia Plan: General   Post-op Pain Management:    Induction: Intravenous  Airway Management Planned: Nasal Cannula  Additional Equipment:   Intra-op Plan:   Post-operative Plan:   Informed Consent: I have reviewed the patients History and Physical, chart, labs and discussed the procedure including the risks, benefits and alternatives for the proposed anesthesia with the patient or authorized representative who has indicated his/her understanding and acceptance.   Dental advisory given  Plan Discussed with: CRNA and Surgeon  Anesthesia Plan Comments:         Anesthesia Quick Evaluation

## 2016-09-13 NOTE — Op Note (Signed)
[facility] Gastroenterology Patient Name: Heather Burke Procedure Date: 09/13/2016 10:18 AM MRN: [id number] Account #: [id number] Date of Birth: [birth year] Admit Type: Outpatient Age: 65 Room: [facility] ENDO ROOM 4 Gender: Female Note Status: Finalized Procedure:            Colonoscopy Indications:          Screening for colorectal malignant neoplasm Providers:            Lucilla Lame MD, MD Referring MD:         Jerrell Belfast, MD (Referring MD) Medicines:            Propofol per Anesthesia Complications:        No immediate complications. Procedure:            Pre-Anesthesia Assessment:                       - Prior to the procedure, a History and Physical was                        performed, and patient medications and allergies were                        reviewed. The patient's tolerance of previous                        anesthesia was also reviewed. The risks and benefits of                        the procedure and the sedation options and risks were                        discussed with the patient. All questions were                        answered, and informed consent was obtained. Prior                        Anticoagulants: The patient has taken no previous                        anticoagulant or antiplatelet agents. ASA Grade                        Assessment: II - A patient with mild systemic disease.                        After reviewing the risks and benefits, the patient was                        deemed in satisfactory condition to undergo the                        procedure.                       After obtaining informed consent, the colonoscope was                        passed under direct vision. Throughout the procedure,  the patient's blood pressure, pulse, and oxygen                        saturations were monitored continuously. The                        Colonoscope was introduced through the anus and              advanced to the the cecum, identified by appendiceal                        orifice and ileocecal valve. The colonoscopy was                        performed without difficulty. The patient tolerated the                        procedure well. The quality of the bowel preparation                        was excellent. Findings:      The perianal and digital rectal examinations were normal.      A 3 mm polyp was found in the sigmoid colon. The polyp was sessile. The       polyp was removed with a cold biopsy forceps. Resection and retrieval       were complete.      Multiple small-mouthed diverticula were found in the sigmoid colon. Impression:           - One 3 mm polyp in the sigmoid colon, removed with a                        cold biopsy forceps. Resected and retrieved.                       - Diverticulosis in the sigmoid colon. Recommendation:       - Await pathology results.                       - Discharge patient to home.                       - Resume previous diet.                       - Continue present medications.                       - Repeat colonoscopy in 5 years if polyp adenoma and 10                        years if hyperplastic Procedure Code(s):    --- Professional ---                       [phone], Colonoscopy, flexible; with biopsy, single or                        multiple Diagnosis Code(s):    --- Professional ---                       Z12.11, Encounter for screening for  malignant neoplasm                        of colon                       D12.5, Benign neoplasm of sigmoid colon CPT copyright 2016 American Medical Association. All rights reserved. The codes documented in this report are preliminary and upon coder review may  be revised to meet current compliance requirements. Lucilla Lame MD, MD 09/13/2016 10:38:16 AM This report has been signed electronically. Number of Addenda: 0 Note Initiated On: 09/13/2016 10:18 AM Scope Withdrawal Time: 0  hours 6 minutes 27 seconds  Total Procedure Duration: 0 hours 9 minutes 36 seconds       [facility]

## 2016-09-13 NOTE — Transfer of Care (Signed)
Immediate Anesthesia Transfer of Care Note  Patient: Heather Burke  Procedure(s) Performed: Procedure(s): COLONOSCOPY WITH PROPOFOL (N/A)  Patient Location: PACU and Endoscopy Unit  Anesthesia Type:General  Level of Consciousness: awake, sedated and patient cooperative  Airway & Oxygen Therapy: Patient Spontanous Breathing and Patient connected to nasal cannula oxygen  Post-op Assessment: Report given to RN and Post -op Vital signs reviewed and stable  Post vital signs: stable  Last Vitals:  Vitals:   09/13/16 0956  BP: (!) 172/83  Pulse: 82  Resp: 20  Temp: 36.8 C    Last Pain:  Vitals:   09/13/16 0956  TempSrc: Tympanic         Complications: No apparent anesthesia complications

## 2016-09-13 NOTE — H&P (Signed)
Lucilla Lame, MD [facility] [address], [place], [place] [address] Phone: [phone] Fax : [phone]  Primary Care Physician:  [name], MD Primary Gastroenterologist:  Dr. Allen Norris  Pre-Procedure History & Physical: HPI:  Heather Burke is a 65 y.o. female is here for a screening colonoscopy.   Past Medical History:  Diagnosis Date  . Cancer ([place]) 06/08/2015   Mammoprint: Low risk,T1b, N0;  ER+,PR+, Her 2 neg (FISH neg) x2.  Marland Kitchen GERD (gastroesophageal reflux disease)   . Heart murmur 2016   newly diagnosed, slight murmur  . Hyperlipidemia   . Hypertension   . Hypothyroidism   . Lupus ([place])   . PONV (postoperative nausea and vomiting)    with Hysterectomy  . Thyroid disease     Past Surgical History:  Procedure Laterality Date  . ABDOMINAL HYSTERECTOMY    . BREAST BIOPSY Right 2002   negative/ Dr. Bary Castilla  . BREAST BIOPSY Left 06-08-15   INVASIVE MAMMARY CARCINOMA WITH PARTIAL SOLID PATTERN.   . core biopsy Left 06/08/15  . GUM SURGERY  2006  . MASTECTOMY Left   . SENTINEL NODE BIOPSY Left 08/18/2015   Procedure: SENTINEL NODE BIOPSY;  Surgeon: [name], MD;  Location: [facility] ORS;  Service: General;  Laterality: Left;  . SIMPLE MASTECTOMY WITH AXILLARY SENTINEL NODE BIOPSY Left 08/18/2015   Procedure: SIMPLE MASTECTOMY;  Surgeon: [name], MD;  Location: [facility] ORS;  Service: General;  Laterality: Left;  . TONSILLECTOMY      Prior to Admission medications   Medication Sig Start Date End Date Taking? Authorizing Provider  aspirin 81 MG tablet Take 81 mg by mouth daily.   Yes Historical Provider, MD  calcium carbonate (OS-CAL) 600 MG TABS tablet Take 1 tablet (600 mg total) by mouth 2 (two) times daily with a meal. 09/09/15  Yes Forest Gleason, MD  Cholecalciferol (D3 ADULT PO) Take 1 tablet by mouth daily.    Yes Historical Provider, MD  FIBER PO Take by mouth.   Yes Historical Provider, MD  fluticasone (FLONASE) 50 MCG/ACT nasal spray Place 2  sprays into both nostrils daily. 09/08/15  Yes [name], MD  losartan-hydrochlorothiazide (HYZAAR) 100-25 MG tablet Take 1 tablet by mouth daily. 09/29/15  Yes [name], MD  lovastatin (MEVACOR) 20 MG tablet Take 1 tablet (20 mg total) by mouth at bedtime. 01/01/16  Yes [name], MD  metFORMIN (GLUCOPHAGE) 500 MG tablet Take 1 tablet (500 mg total) by mouth 2 (two) times daily with a meal. 06/02/16  Yes [name], MD  Omega-3 Fatty Acids (FISH OIL) 1200 MG CAPS Take by mouth daily.   Yes Historical Provider, MD  SYNTHROID 88 MCG tablet Take 1 tablet (88 mcg total) by mouth daily before breakfast. 09/29/15  Yes [name], MD  tamoxifen (NOLVADEX) 20 MG tablet Take 1 tablet (20 mg total) by mouth daily. 06/08/16  Yes Evlyn Kanner, NP  varenicline (CHANTIX PAK) 0.5 MG X 11 & 1 MG X 42 tablet Take one 0.5 mg tablet PO once daily x 3 days, then increase to one 0.5 mg tablet BID x 4 days, then increase to one 1 mg tablet BID Patient not taking: Reported on 09/13/2016 08/03/16   [name], PA-C    Allergies as of 08/30/2016 - Review Complete 08/25/2016  Allergen Reaction Noted  . Atorvastatin Nausea And Vomiting 06/08/2016  . Lisinopril Cough 09/29/2015  . Pravastatin Nausea And Vomiting 06/08/2016    Family History  Problem Relation  Age of Onset  . Cancer Father     prostate  . Thyroid disease Daughter   . Heart attack Sister   . Breast cancer Cousin     Social History   Social History  . Marital status: Widowed    Spouse name: N/A  . Number of children: 2  . Years of education: H/S   Occupational History  . Part-Time    Social History Main Topics  . Smoking status: Current Every Day Smoker    Packs/day: 2.00    Years: 44.00    Types: Cigarettes  . Smokeless tobacco: Never Used  . Alcohol use 0.0 oz/week     Comment: 3-4 beers/day  . Drug use: No  . Sexual activity: Not on file   Other Topics Concern  . Not on file   Social History  Narrative  . No narrative on file    Review of Systems: See HPI, otherwise negative ROS  Physical Exam: BP (!) 172/83   Pulse 82   Temp 98.2 F (36.8 C) (Tympanic)   Resp 20   Ht 5\' 3"  (1.6 m)   Wt 170 lb (77.1 kg)   SpO2 100%   BMI 30.11 kg/m  General:   Alert,  pleasant and cooperative in NAD Head:  Normocephalic and atraumatic. Neck:  Supple; no masses or thyromegaly. Lungs:  Clear throughout to auscultation.    Heart:  Regular rate and rhythm. Abdomen:  Soft, nontender and nondistended. Normal bowel sounds, without guarding, and without rebound.   Neurologic:  Alert and  oriented x4;  grossly normal neurologically.  Impression/Plan: Heather Burke is now here to undergo a screening colonoscopy.  Risks, benefits, and alternatives regarding colonoscopy have been reviewed with the patient.  Questions have been answered.  All parties agreeable.

## 2016-09-14 ENCOUNTER — Encounter: Payer: Self-pay | Admitting: Gastroenterology

## 2016-09-14 NOTE — Anesthesia Postprocedure Evaluation (Signed)
Anesthesia Post Note  Patient: Heather Burke  Procedure(s) Performed: Procedure(s) (LRB): COLONOSCOPY WITH PROPOFOL (N/A)  Patient location during evaluation: PACU Anesthesia Type: General Level of consciousness: awake and alert and oriented Pain management: pain level controlled Vital Signs Assessment: post-procedure vital signs reviewed and stable Respiratory status: spontaneous breathing Cardiovascular status: blood pressure returned to baseline Anesthetic complications: no    Last Vitals:  Vitals:   09/13/16 1059 09/13/16 1118  BP: 140/71 (!) 154/72  Pulse:    Resp:    Temp:      Last Pain:  Vitals:   09/14/16 0801  TempSrc:   PainSc: 0-No pain                 [name]

## 2016-09-15 LAB — SURGICAL PATHOLOGY

## 2016-09-19 ENCOUNTER — Encounter: Payer: Self-pay | Admitting: Gastroenterology

## 2016-09-23 ENCOUNTER — Ambulatory Visit: Admit: 2016-09-23 | Payer: BLUE CROSS/BLUE SHIELD | Admitting: Gastroenterology

## 2016-09-23 SURGERY — COLONOSCOPY WITH PROPOFOL
Anesthesia: General

## 2016-10-05 ENCOUNTER — Other Ambulatory Visit: Payer: Self-pay | Admitting: Family Medicine

## 2016-10-05 DIAGNOSIS — I10 Essential (primary) hypertension: Secondary | ICD-10-CM

## 2016-10-20 ENCOUNTER — Other Ambulatory Visit: Payer: Self-pay | Admitting: Family Medicine

## 2016-10-20 DIAGNOSIS — E039 Hypothyroidism, unspecified: Secondary | ICD-10-CM

## 2016-12-11 ENCOUNTER — Other Ambulatory Visit: Payer: Self-pay | Admitting: Family Medicine

## 2016-12-13 NOTE — Telephone Encounter (Signed)
Last ov 08/03/16. Last filled 06/03/16. Please review. Thank you. sd

## 2016-12-15 ENCOUNTER — Telehealth: Payer: Self-pay | Admitting: Hematology and Oncology

## 2016-12-15 DIAGNOSIS — C50912 Malignant neoplasm of unspecified site of left female breast: Secondary | ICD-10-CM

## 2016-12-15 MED ORDER — TAMOXIFEN CITRATE 20 MG PO TABS: 20.0000 mg | ORAL_TABLET | Freq: Every day | ORAL | 0 refills | Status: DC

## 2016-12-15 NOTE — Telephone Encounter (Signed)
[facility] says they have sent a refill request for tamoxifen several times and have not received it yet. Please send asap. Thanks.

## 2016-12-17 ENCOUNTER — Other Ambulatory Visit: Payer: Self-pay | Admitting: Family Medicine

## 2016-12-17 DIAGNOSIS — E785 Hyperlipidemia, unspecified: Secondary | ICD-10-CM

## 2016-12-22 ENCOUNTER — Encounter: Payer: Self-pay | Admitting: Hematology and Oncology

## 2016-12-22 ENCOUNTER — Inpatient Hospital Stay (HOSPITAL_BASED_OUTPATIENT_CLINIC_OR_DEPARTMENT_OTHER): Payer: BLUE CROSS/BLUE SHIELD

## 2016-12-22 ENCOUNTER — Telehealth: Payer: Self-pay | Admitting: Physician Assistant

## 2016-12-22 ENCOUNTER — Other Ambulatory Visit: Payer: Self-pay | Admitting: Family Medicine

## 2016-12-22 ENCOUNTER — Inpatient Hospital Stay: Payer: BLUE CROSS/BLUE SHIELD | Attending: Hematology and Oncology | Admitting: Hematology and Oncology

## 2016-12-22 ENCOUNTER — Other Ambulatory Visit: Payer: Self-pay

## 2016-12-22 VITALS — BP 186/101 | HR 79 | Temp 97.2°F | Resp 18 | Wt 181.9 lb

## 2016-12-22 DIAGNOSIS — Z90710 Acquired absence of both cervix and uterus: Secondary | ICD-10-CM | POA: Insufficient documentation

## 2016-12-22 DIAGNOSIS — Z170 Estrogen receptor positive status [ER+]: Secondary | ICD-10-CM | POA: Insufficient documentation

## 2016-12-22 DIAGNOSIS — E785 Hyperlipidemia, unspecified: Secondary | ICD-10-CM | POA: Insufficient documentation

## 2016-12-22 DIAGNOSIS — C50912 Malignant neoplasm of unspecified site of left female breast: Secondary | ICD-10-CM | POA: Insufficient documentation

## 2016-12-22 DIAGNOSIS — Z9012 Acquired absence of left breast and nipple: Secondary | ICD-10-CM | POA: Diagnosis not present

## 2016-12-22 DIAGNOSIS — Z7982 Long term (current) use of aspirin: Secondary | ICD-10-CM | POA: Diagnosis not present

## 2016-12-22 DIAGNOSIS — M329 Systemic lupus erythematosus, unspecified: Secondary | ICD-10-CM | POA: Insufficient documentation

## 2016-12-22 DIAGNOSIS — E079 Disorder of thyroid, unspecified: Secondary | ICD-10-CM | POA: Insufficient documentation

## 2016-12-22 DIAGNOSIS — I10 Essential (primary) hypertension: Secondary | ICD-10-CM

## 2016-12-22 DIAGNOSIS — R200 Anesthesia of skin: Secondary | ICD-10-CM | POA: Insufficient documentation

## 2016-12-22 DIAGNOSIS — Z7984 Long term (current) use of oral hypoglycemic drugs: Secondary | ICD-10-CM | POA: Insufficient documentation

## 2016-12-22 DIAGNOSIS — F17210 Nicotine dependence, cigarettes, uncomplicated: Secondary | ICD-10-CM | POA: Insufficient documentation

## 2016-12-22 DIAGNOSIS — Z79899 Other long term (current) drug therapy: Secondary | ICD-10-CM | POA: Diagnosis not present

## 2016-12-22 DIAGNOSIS — E039 Hypothyroidism, unspecified: Secondary | ICD-10-CM | POA: Insufficient documentation

## 2016-12-22 DIAGNOSIS — J309 Allergic rhinitis, unspecified: Secondary | ICD-10-CM

## 2016-12-22 DIAGNOSIS — Z79810 Long term (current) use of selective estrogen receptor modulators (SERMs): Secondary | ICD-10-CM | POA: Diagnosis not present

## 2016-12-22 DIAGNOSIS — K219 Gastro-esophageal reflux disease without esophagitis: Secondary | ICD-10-CM | POA: Diagnosis not present

## 2016-12-22 DIAGNOSIS — Z8041 Family history of malignant neoplasm of ovary: Secondary | ICD-10-CM | POA: Insufficient documentation

## 2016-12-22 LAB — CBC WITH DIFFERENTIAL/PLATELET
Basophils Absolute: 0.1 10*3/uL (ref 0–0.1)
Basophils Relative: 1 %
Eosinophils Absolute: 0.2 10*3/uL (ref 0–0.7)
Eosinophils Relative: 2 %
HCT: 40.8 % (ref 35.0–47.0)
Hemoglobin: 14.3 g/dL (ref 12.0–16.0)
Lymphocytes Relative: 33 %
Lymphs Abs: 2.2 10*3/uL (ref 1.0–3.6)
MCH: 32.8 pg (ref 26.0–34.0)
MCHC: 35 g/dL (ref 32.0–36.0)
MCV: 93.6 fL (ref 80.0–100.0)
Monocytes Absolute: 0.5 10*3/uL (ref 0.2–0.9)
Monocytes Relative: 7 %
Neutro Abs: 3.8 10*3/uL (ref 1.4–6.5)
Neutrophils Relative %: 57 %
Platelets: 204 10*3/uL (ref 150–440)
RBC: 4.36 MIL/uL (ref 3.80–5.20)
RDW: 13.2 % (ref 11.5–14.5)
WBC: 6.8 10*3/uL (ref 3.6–11.0)

## 2016-12-22 LAB — COMPREHENSIVE METABOLIC PANEL
ALT: 21 U/L (ref 14–54)
AST: 27 U/L (ref 15–41)
Albumin: 3.8 g/dL (ref 3.5–5.0)
Alkaline Phosphatase: 37 U/L — ABNORMAL LOW (ref 38–126)
Anion gap: 8 (ref 5–15)
BUN: 7 mg/dL (ref 6–20)
CO2: 26 mmol/L (ref 22–32)
Calcium: 8.8 mg/dL — ABNORMAL LOW (ref 8.9–10.3)
Chloride: 98 mmol/L — ABNORMAL LOW (ref 101–111)
Creatinine, Ser: 0.59 mg/dL (ref 0.44–1.00)
GFR calc Af Amer: >60 mL/min (ref >60)
GFR calc non Af Amer: >60 mL/min (ref >60)
Glucose, Bld: 140 mg/dL — ABNORMAL HIGH (ref 65–99)
Potassium: 3.2 mmol/L — ABNORMAL LOW (ref 3.5–5.1)
Sodium: 132 mmol/L — ABNORMAL LOW (ref 135–145)
Total Bilirubin: 0.3 mg/dL (ref 0.3–1.2)
Total Protein: 7.4 g/dL (ref 6.5–8.1)

## 2016-12-22 MED ORDER — TAMOXIFEN CITRATE 20 MG PO TABS: 20.0000 mg | ORAL_TABLET | Freq: Every day | ORAL | 3 refills | Status: DC

## 2016-12-22 NOTE — Telephone Encounter (Signed)
-----   Message from Shirlean Kelly, RN sent at 12/22/2016 10:29 AM EST ----- Hi, My name is Rodena Piety.  I work with Dr. Nolon Stalls, Oncologist @ [facility] @ [facility].  We saw this patient this morning and her N+ and K+ levels are decreased.  Her BP was elevated.  190/108 HR 85, 187/101 HR 83 and 186/101 HR 79.  Dr. Mike Gip asked that I send you a copy of her labs for your review.

## 2016-12-22 NOTE — Progress Notes (Signed)
Patient's BP elevated.  190/108 HR 85   Recheck 187/101 HR 83.  Patient also states she has been having pain in the calves of both legs.  Says "it is not a cramp but feels more like the muscles are knotted up".  She has a new grandchild and says she has been walking a lot while holding the baby.

## 2016-12-22 NOTE — Telephone Encounter (Signed)
This patient needs an appt for BP and to discuss labs please. I have not seen her since 07/2016 and she is having acute changes noted from Dr. Mike Gip. May need extended 30 min visit please.

## 2016-12-22 NOTE — Progress Notes (Signed)
[facility] day:  12/22/2016  Chief Complaint: Heather Burke is a 66 y.o. female with stage IA multi-focal left breast cancer who is seen for 4 month ssessment.  HPI:  The patient was last seen in the medical oncology clinic on 08/25/2016.  At that time, she was seen for intial assessment.  She was doing well on tamoxifen.  CA27.29 was 30.2.  Smoking cessation was discussed.  She states that her blood pressure varies.  Systolic blood pressure with Fenton Malling, PA, was normal.  She is trying to stop smoking.  She is currently day 3 of Chantix.  She has felt a knot in her calf.  She denies any trauma or swelling.   Past Medical History:  Diagnosis Date  . Cancer ([place]) 06/08/2015   Mammoprint: Low risk,T1b, N0;  ER+,PR+, Her 2 neg (FISH neg) x2.  Marland Kitchen GERD (gastroesophageal reflux disease)   . Heart murmur 2016   newly diagnosed, slight murmur  . Hyperlipidemia   . Hypertension   . Hypothyroidism   . Lupus   . PONV (postoperative nausea and vomiting)    with Hysterectomy  . Thyroid disease     Past Surgical History:  Procedure Laterality Date  . ABDOMINAL HYSTERECTOMY    . BREAST BIOPSY Right 2002   negative/ Dr. Bary Castilla  . BREAST BIOPSY Left 06-08-15   INVASIVE MAMMARY CARCINOMA WITH PARTIAL SOLID PATTERN.   . COLONOSCOPY WITH PROPOFOL N/A 09/13/2016   Procedure: COLONOSCOPY WITH PROPOFOL;  Surgeon: Lucilla Lame, MD;  Location: [facility] ENDOSCOPY;  Service: Endoscopy;  Laterality: N/A;  . core biopsy Left 06/08/15  . GUM SURGERY  2006  . MASTECTOMY Left   . SENTINEL NODE BIOPSY Left 08/18/2015   Procedure: SENTINEL NODE BIOPSY;  Surgeon: [name], MD;  Location: [facility] ORS;  Service: General;  Laterality: Left;  . SIMPLE MASTECTOMY WITH AXILLARY SENTINEL NODE BIOPSY Left 08/18/2015   Procedure: SIMPLE MASTECTOMY;  Surgeon: [name], MD;  Location: [facility] ORS;  Service: General;  Laterality: Left;  . TONSILLECTOMY       Family History  Problem Relation Age of Onset  . Cancer Father     prostate  . Thyroid disease Daughter   . Heart attack Sister   . Breast cancer Cousin     Social History:  reports that she has been smoking Cigarettes.  She has a 88.00 pack-year smoking history. She has never used smokeless tobacco. She reports that she drinks alcohol. She reports that she does not use drugs.  She has smoked 2 packs per day since age 88.  She plans on taking Chantix.  Her niece is J.  Weaver.  She lives in [place].  The patient is alone today.  Allergies:  Allergies  Allergen Reactions  . Atorvastatin Nausea And Vomiting  . Lisinopril Cough  . Pravastatin Nausea And Vomiting    Current Medications: Current Outpatient Prescriptions  Medication Sig Dispense Refill  . aspirin 81 MG tablet Take 81 mg by mouth daily.    . calcium carbonate (OS-CAL) 600 MG TABS tablet Take 1 tablet (600 mg total) by mouth 2 (two) times daily with a meal. 60 tablet   . Cholecalciferol (D3 ADULT PO) Take 1 tablet by mouth daily.     Marland Kitchen FIBER PO Take by mouth.    . fluticasone (FLONASE) 50 MCG/ACT nasal spray Place 2 sprays into both nostrils daily. 48 g 3  . losartan-hydrochlorothiazide (HYZAAR) 100-25 MG tablet  TAKE 1 TABLET BY MOUTH DAILY. 90 tablet 1  . lovastatin (MEVACOR) 20 MG tablet TAKE 1 TABLET (20 MG TOTAL) BY MOUTH AT BEDTIME. 90 tablet 1  . metFORMIN (GLUCOPHAGE) 500 MG tablet TAKE 1 TABLET (500 MG TOTAL) BY MOUTH 2 (TWO) TIMES DAILY WITH A MEAL. 180 tablet 1  . Omega-3 Fatty Acids (FISH OIL) 1200 MG CAPS Take by mouth daily.    Marland Kitchen SYNTHROID 88 MCG tablet TAKE 1 TABLET (88 MCG TOTAL) BY MOUTH DAILY BEFORE BREAKFAST. 90 tablet 1  . tamoxifen (NOLVADEX) 20 MG tablet Take 1 tablet (20 mg total) by mouth daily. 30 tablet 0  . varenicline (CHANTIX PAK) 0.5 MG X 11 & 1 MG X 42 tablet Take one 0.5 mg tablet PO once daily x 3 days, then increase to one 0.5 mg tablet BID x 4 days, then increase to one 1 mg tablet  BID 53 tablet 0   No current facility-administered medications for this visit.     Review of Systems:  GENERAL:  Feels good.  Active.  No fevers, sweats or weight loss. PERFORMANCE STATUS (ECOG): 0 HEENT:  No visual changes, runny nose, sore throat, mouth sores or tenderness. Lungs: No shortness of breath or cough.  No hemoptysis. Cardiac:  No chest pain, palpitations, orthopnea, or PND.  Blood pressure varies. GI:  No nausea, vomiting, diarrhea, constipation, melena or hematochezia. GU:  No urgency, frequency, dysuria, or hematuria. Musculoskeletal:  No back pain.  No joint pain.  No muscle tenderness. Extremities:  No pain or swelling. Skin:  No rashes or skin changes. Neuro:  No headache, numbness or weakness, balance or coordination issues. Endocrine:  Diabetes.  Thyroid disease on Synthroid. Hot  Flashes.   No night sweats. Psych:  No mood changes, depression or anxiety. Pain:  No focal pain. Review of systems:  All other systems reviewed and found to be negative.  Physical Exam: Blood pressure (!) 187/101, pulse 85, temperature 97.2 F (36.2 C), temperature source Tympanic, resp. rate 18, weight 181 lb 14.1 oz (82.5 kg). GENERAL:  Well developed, well nourished, woman sitting comfortably in the exam room in no acute distress. MENTAL STATUS:  Alert and oriented to person, place and time. HEAD:  Shoulder length brown hair with highlights.  Normocephalic, atraumatic, face symmetric, no Cushingoid features. EYES:  Blue eyes.  Pupils equal round and reactive to light and accomodation.  No conjunctivitis or scleral icterus. ENT:  Oropharynx clear without lesion.  Tongue normal. Mucous membranes moist.  RESPIRATORY:  Clear to auscultation without rales, wheezes or rhonchi. CARDIOVASCULAR:  Regular rate and rhythm without murmur, rub or gallop. BREAST:  Right breast with inferior fibrocystic changes.  Cyst near sternum on right.  No masses, skin changes or nipple discharge. s/p left  mastectomy.  No erythema or nodularity.  ABDOMEN:  Soft, non-tender, with active bowel sounds, and no hepatosplenomegaly.  No masses. SKIN:  No rashes, ulcers or lesions. EXTREMITIES: No edema, no skin discoloration or tenderness.  No palpable cords. LYMPH NODES: No palpable cervical, supraclavicular, axillary or inguinal adenopathy  NEUROLOGICAL: Unremarkable. PSYCH:  Appropriate.   Orders Only on 12/22/2016  Component Date Value Ref Range Status  . WBC 12/22/2016 6.8  3.6 - 11.0 K/uL Final  . RBC 12/22/2016 4.36  3.80 - 5.20 MIL/uL Final  . Hemoglobin 12/22/2016 14.3  12.0 - 16.0 g/dL Final  . HCT 12/22/2016 40.8  35.0 - 47.0 % Final  . MCV 12/22/2016 93.6  80.0 - 100.0 fL Final  .  MCH 12/22/2016 32.8  26.0 - 34.0 pg Final  . MCHC 12/22/2016 35.0  32.0 - 36.0 g/dL Final  . RDW 12/22/2016 13.2  11.5 - 14.5 % Final  . Platelets 12/22/2016 204  150 - 440 K/uL Final  . Neutrophils Relative % 12/22/2016 57  % Final  . Neutro Abs 12/22/2016 3.8  1.4 - 6.5 K/uL Final  . Lymphocytes Relative 12/22/2016 33  % Final  . Lymphs Abs 12/22/2016 2.2  1.0 - 3.6 K/uL Final  . Monocytes Relative 12/22/2016 7  % Final  . Monocytes Absolute 12/22/2016 0.5  0.2 - 0.9 K/uL Final  . Eosinophils Relative 12/22/2016 2  % Final  . Eosinophils Absolute 12/22/2016 0.2  0 - 0.7 K/uL Final  . Basophils Relative 12/22/2016 1  % Final  . Basophils Absolute 12/22/2016 0.1  0 - 0.1 K/uL Final  . Sodium 12/22/2016 132* 135 - 145 mmol/L Final  . Potassium 12/22/2016 3.2* 3.5 - 5.1 mmol/L Final  . Chloride 12/22/2016 98* 101 - 111 mmol/L Final  . CO2 12/22/2016 26  22 - 32 mmol/L Final  . Glucose, Bld 12/22/2016 140* 65 - 99 mg/dL Final  . BUN 12/22/2016 7  6 - 20 mg/dL Final  . Creatinine, Ser 12/22/2016 0.59  0.44 - 1.00 mg/dL Final  . Calcium 12/22/2016 8.8* 8.9 - 10.3 mg/dL Final  . Total Protein 12/22/2016 7.4  6.5 - 8.1 g/dL Final  . Albumin 12/22/2016 3.8  3.5 - 5.0 g/dL Final  . AST 12/22/2016 27  15 -  41 U/L Final  . ALT 12/22/2016 21  14 - 54 U/L Final  . Alkaline Phosphatase 12/22/2016 37* 38 - 126 U/L Final  . Total Bilirubin 12/22/2016 0.3  0.3 - 1.2 mg/dL Final  . GFR calc non Af Amer 12/22/2016 >60  >60 mL/min Final  . GFR calc Af Amer 12/22/2016 >60  >60 mL/min Final   Comment: (NOTE) The eGFR has been calculated using the CKD EPI equation. This calculation has not been validated in all clinical situations. eGFR's persistently <60 mL/min signify possible Chronic Kidney Disease.   Georgiann Hahn gap 12/22/2016 8  5 - 15 Final    Assessment:  Heather Burke is a 66 y.o. female with stage IA multi-focal left breast cancer s/p left mastectomy on 08/18/2015.  Pathology revealed three separate foci of lobular cancer (18 mm, 9 mm, 0.9 mm) and one focus of invasive mammary carcinoma (5 mm).  One lymph node was negative for malignancy.  ER and PR receptors were positive.  HER-2/neu was negative.  Pathologic stage was T1cN0M0.  MammaPrint on 08/28/2015 revealed low risk luminal-type (A).   She was on letrozole (08/2015) then Aromasin (02/2016).  She developed numbness in upper extremity (noticed more in right hand). Tamoxifen was started in 03/2016 or 04/2016.  She notes that her hands still hurt when taking a child proof cap off.  She has hot flashes.  Right sided mammogram on 05/24/2016 revealed no evidence of malignancy.  She has a colonoscopy planned on 09/20/2016.  CA27.29 is followed:  30.2 on 08/25/2016, 29.3 on 12/22/2016.  Bone density on 01/07/2016 was normal with a T-score of -0.2 in the right femoral neck.  L-4 was excluded due to degenerative disease.  She is on calcium.  Symptomatically, she is doing well.  She is trying to quit smoking.  Exam is stable.  Plan: 1.  Labs today:  CBC with diff, CMP, CA27.29. 2.  Rx:  tamoxifen. 3.  Rght  sided mammogram on 05/24/2017. 4.  RTC in 4 months for MD assessment and labs (CBC with diff, CMP, CA27.29).   Lequita Asal, MD   12/22/2016, 9:59 AM

## 2016-12-23 LAB — CANCER ANTIGEN 27.29: CA 27.29: 29.3 U/mL (ref 0.0–38.6)

## 2016-12-23 NOTE — Telephone Encounter (Signed)
Pt had 15 minute appointment already scheduled on 01/06/2016. I changed the appointment to a 30 minute at 11:30 on 01/06/2016. Renaldo Fiddler, CMA

## 2016-12-23 NOTE — Telephone Encounter (Signed)
That works! Thanks Raquel Sarna.

## 2017-01-05 ENCOUNTER — Ambulatory Visit: Payer: BLUE CROSS/BLUE SHIELD | Admitting: Physician Assistant

## 2017-01-05 ENCOUNTER — Ambulatory Visit: Payer: Self-pay | Admitting: Physician Assistant

## 2017-01-06 ENCOUNTER — Ambulatory Visit: Payer: Self-pay | Admitting: Physician Assistant

## 2017-01-11 ENCOUNTER — Other Ambulatory Visit: Payer: Self-pay | Admitting: Physician Assistant

## 2017-01-11 DIAGNOSIS — Z716 Tobacco abuse counseling: Secondary | ICD-10-CM

## 2017-01-11 MED ORDER — VARENICLINE TARTRATE 1 MG PO TABS
1.0000 mg | ORAL_TABLET | Freq: Two times a day (BID) | ORAL | 2 refills | Status: DC
Start: 2017-01-11 — End: 2017-07-21

## 2017-01-11 NOTE — Telephone Encounter (Signed)
Can we see if she is taking this? If so she will need a refill of continuing pak instead.

## 2017-01-27 ENCOUNTER — Ambulatory Visit (INDEPENDENT_AMBULATORY_CARE_PROVIDER_SITE_OTHER): Payer: BLUE CROSS/BLUE SHIELD | Admitting: Physician Assistant

## 2017-01-27 ENCOUNTER — Encounter: Payer: Self-pay | Admitting: Physician Assistant

## 2017-01-27 VITALS — BP 178/96 | HR 72 | Temp 98.8°F | Resp 16 | Wt 183.0 lb

## 2017-01-27 DIAGNOSIS — I10 Essential (primary) hypertension: Secondary | ICD-10-CM

## 2017-01-27 DIAGNOSIS — E876 Hypokalemia: Secondary | ICD-10-CM | POA: Diagnosis not present

## 2017-01-27 DIAGNOSIS — E119 Type 2 diabetes mellitus without complications: Secondary | ICD-10-CM

## 2017-01-27 DIAGNOSIS — E7800 Pure hypercholesterolemia, unspecified: Secondary | ICD-10-CM | POA: Diagnosis not present

## 2017-01-27 LAB — POCT GLYCOSYLATED HEMOGLOBIN (HGB A1C): HEMOGLOBIN A1C: 6.4

## 2017-01-27 MED ORDER — LOSARTAN POTASSIUM-HCTZ 100-25 MG PO TABS: 1.0000 | ORAL_TABLET | Freq: Every day | ORAL | 1 refills | Status: DC

## 2017-01-27 MED ORDER — POTASSIUM CHLORIDE CRYS ER 10 MEQ PO TBCR: 10.0000 meq | EXTENDED_RELEASE_TABLET | Freq: Every day | ORAL | 1 refills | Status: DC

## 2017-01-27 MED ORDER — AMLODIPINE BESYLATE 5 MG PO TABS: 5.0000 mg | ORAL_TABLET | Freq: Every day | ORAL | 1 refills | Status: DC

## 2017-01-27 NOTE — Progress Notes (Signed)
Patient: Heather Burke Female    DOB: 02/11/1951   67 y.o.   MRN: [id number] Visit Date: 02/05/2017  Today's Provider: [name], PA-C   Chief Complaint  Patient presents with  . Hyperlipidemia  . Hypertension  . Diabetes   Subjective:    HPI    Diabetes Mellitus Type II, Follow-up:   Lab Results  Component Value Date   HGBA1C 6.4 01/27/2017   HGBA1C 6.0 08/03/2016   HGBA1C 7.1 04/15/2016   Last seen for diabetes 6 months ago.  Management since then includes None. She reports excellent compliance with treatment. She is not having side effects.  Current symptoms include none and have been stable. Home blood sugar records: Pt is not checking her blood sugars at home.   Episodes of hypoglycemia? no   Most Recent Eye Exam: 11/2016 Weight trend: Slightly increased Current diet: in general, a "healthy" diet   Current exercise: none  ------------------------------------------------------------------------   Hypertension, follow-up:  BP Readings from Last 3 Encounters:  01/27/17 (!) 178/96  12/22/16 (!) 186/101  09/13/16 (!) 154/72    She was last seen for hypertension 6 months ago.  Management since that visit includes None.She reports excellent compliance with treatment. She is not having side effects.  She is exercising. Not a formal exercise routine; but pt reports staying very active with a new three month and 6 year old grandson.  Outside blood pressures are Pt is not checking her blood pressure outside of here. She is experiencing none.  Patient denies chest pain, irregular heart beat, lower extremity edema and palpitations.   Cardiovascular risk factors include advanced age (older than 85 for men, 58 for women), diabetes mellitus, dyslipidemia and hypertension.   ------------------------------------------------------------------------    Lipid/Cholesterol, Follow-up:   Last seen for this 6 months ago.  Management since that  visit includes None.  Last Lipid Panel:    Component Value Date/Time   CHOL 180 04/20/2016 0804   TRIG 353 (H) 04/20/2016 0804   HDL 32 (L) 04/20/2016 0804   CHOLHDL 5.6 (H) 04/20/2016 0804   LDLCALC 77 04/20/2016 0804    She reports excellent compliance with treatment. She is not having side effects.   Wt Readings from Last 3 Encounters:  01/27/17 183 lb (83 kg)  12/22/16 181 lb 14.1 oz (82.5 kg)  09/13/16 170 lb (77.1 kg)   ------------------------------------------------------------------------     Allergies  Allergen Reactions  . Atorvastatin Nausea And Vomiting  . Lisinopril Cough  . Pravastatin Nausea And Vomiting     Current Outpatient Prescriptions:  .  aspirin 81 MG tablet, Take 81 mg by mouth daily., Disp: , Rfl:  .  calcium carbonate (OS-CAL) 600 MG TABS tablet, Take 1 tablet (600 mg total) by mouth 2 (two) times daily with a meal., Disp: 60 tablet, Rfl:  .  Cholecalciferol (D3 ADULT PO), Take 1 tablet by mouth daily. , Disp: , Rfl:  .  FIBER PO, Take by mouth., Disp: , Rfl:  .  fluticasone (FLONASE) 50 MCG/ACT nasal spray, PLACE 2 SPRAYS INTO BOTH NOSTRILS DAILY., Disp: 48 g, Rfl: 1 .  losartan-hydrochlorothiazide (HYZAAR) 100-25 MG tablet, Take 1 tablet by mouth daily., Disp: 90 tablet, Rfl: 1 .  lovastatin (MEVACOR) 20 MG tablet, TAKE 1 TABLET (20 MG TOTAL) BY MOUTH AT BEDTIME., Disp: 90 tablet, Rfl: 1 .  metFORMIN (GLUCOPHAGE) 500 MG tablet, TAKE 1 TABLET (500 MG TOTAL) BY MOUTH 2 (TWO) TIMES DAILY WITH A MEAL.,  Disp: 180 tablet, Rfl: 1 .  Omega-3 Fatty Acids (FISH OIL) 1200 MG CAPS, Take by mouth daily., Disp: , Rfl:  .  SYNTHROID 88 MCG tablet, TAKE 1 TABLET (88 MCG TOTAL) BY MOUTH DAILY BEFORE BREAKFAST., Disp: 90 tablet, Rfl: 1 .  tamoxifen (NOLVADEX) 20 MG tablet, Take 1 tablet (20 mg total) by mouth daily., Disp: 90 tablet, Rfl: 3 .  varenicline (CHANTIX CONTINUING MONTH PAK) 1 MG tablet, Take 1 tablet (1 mg total) by mouth 2 (two) times daily., Disp:  60 tablet, Rfl: 2 .  amLODipine (NORVASC) 5 MG tablet, Take 1 tablet (5 mg total) by mouth daily., Disp: 90 tablet, Rfl: 1 .  potassium chloride (K-DUR,KLOR-CON) 10 MEQ tablet, Take 1 tablet (10 mEq total) by mouth daily., Disp: 90 tablet, Rfl: 1  Review of Systems  Constitutional: Negative.   Respiratory: Positive for cough (Chronic issue; pt reports this is stable. ). Negative for apnea, choking, chest tightness, shortness of breath, wheezing and stridor.   Cardiovascular: Negative.   Gastrointestinal: Negative.   Endocrine: Negative.   Musculoskeletal: Positive for myalgias (Mild leg cramping occasionally). Negative for arthralgias, back pain, gait problem, joint swelling, neck pain and neck stiffness.  Neurological: Negative for dizziness, light-headedness and headaches.    Social History  Substance Use Topics  . Smoking status: Current Every Day Smoker    Packs/day: 2.00    Years: 44.00    Types: Cigarettes  . Smokeless tobacco: Never Used  . Alcohol use 0.0 oz/week     Comment: 3-4 beers/day   Objective:   BP (!) 178/96 (BP Location: Right Arm, Patient Position: Sitting, Cuff Size: Normal)   Pulse 72   Temp 98.8 F (37.1 C) (Oral)   Resp 16   Wt 183 lb (83 kg)   BMI 32.42 kg/m   Physical Exam  Constitutional: She appears well-developed and well-nourished. No distress.  Neck: Normal range of motion. Neck supple. No JVD present. No tracheal deviation present. No thyromegaly present.  Cardiovascular: Normal rate, regular rhythm and normal heart sounds.  Exam reveals no gallop and no friction rub.   No murmur heard. Pulmonary/Chest: Effort normal and breath sounds normal. No respiratory distress. She has no wheezes. She has no rales.  Musculoskeletal: She exhibits no edema.  Lymphadenopathy:    She has no cervical adenopathy.  Skin: She is not diaphoretic.  Vitals reviewed.       Assessment & Plan:     1. Essential hypertension Uncontrolled. Will continue Hyzaar  as below and will add amlodipine 5mg . I will see her back in 4 weeks for recheck. - amLODipine (NORVASC) 5 MG tablet; Take 1 tablet (5 mg total) by mouth daily.  Dispense: 90 tablet; Refill: 1 - losartan-hydrochlorothiazide (HYZAAR) 100-25 MG tablet; Take 1 tablet by mouth daily.  Dispense: 90 tablet; Refill: 1  2. Pure hypercholesterolemia Stable. Continue lovastatin 20mg .   3. Type 2 diabetes mellitus without complication, without long-term current use of insulin (HCC) A1c stable and under goal at 6.4. Continue Metformin 500mg  BID. - POCT glycosylated hemoglobin (Hb A1C)  4. Hypokalemia Stable. Diagnosis pulled for medication refill. Continue current medical treatment plan. - potassium chloride (K-DUR,KLOR-CON) 10 MEQ tablet; Take 1 tablet (10 mEq total) by mouth daily.  Dispense: 90 tablet; Refill: Al[place], PA-C  [facility]

## 2017-01-27 NOTE — Patient Instructions (Signed)
DASH Eating Plan DASH stands for "Dietary Approaches to Stop Hypertension." The DASH eating plan is a healthy eating plan that has been shown to reduce high blood pressure (hypertension). Additional health benefits may include reducing the risk of type 2 diabetes mellitus, heart disease, and stroke. The DASH eating plan may also help with weight loss. What do I need to know about the DASH eating plan? For the DASH eating plan, you will follow these general guidelines:  Choose foods with less than 150 milligrams of sodium per serving (as listed on the food label).  Use salt-free seasonings or herbs instead of table salt or sea salt.  Check with your health care provider or pharmacist before using salt substitutes.  Eat lower-sodium products. These are often labeled as "low-sodium" or "no salt added."  Eat fresh foods. Avoid eating a lot of canned foods.  Eat more vegetables, fruits, and low-fat dairy products.  Choose whole grains. Look for the word "whole" as the first word in the ingredient list.  Choose fish and skinless chicken or turkey more often than red meat. Limit fish, poultry, and meat to 6 oz (170 g) each day.  Limit sweets, desserts, sugars, and sugary drinks.  Choose heart-healthy fats.  Eat more home-cooked food and less restaurant, buffet, and fast food.  Limit fried foods.  Do not fry foods. Cook foods using methods such as baking, boiling, grilling, and broiling instead.  When eating at a restaurant, ask that your food be prepared with less salt, or no salt if possible. What foods can I eat? Seek help from a dietitian for individual calorie needs. Grains  Whole grain or whole wheat bread. Brown rice. Whole grain or whole wheat pasta. Quinoa, bulgur, and whole grain cereals. Low-sodium cereals. Corn or whole wheat flour tortillas. Whole grain cornbread. Whole grain crackers. Low-sodium crackers. Vegetables  Fresh or frozen vegetables (raw, steamed, roasted, or  grilled). Low-sodium or reduced-sodium tomato and vegetable juices. Low-sodium or reduced-sodium tomato sauce and paste. Low-sodium or reduced-sodium canned vegetables. Fruits  All fresh, canned (in natural juice), or frozen fruits. Meat and Other Protein Products  Ground beef (85% or leaner), grass-fed beef, or beef trimmed of fat. Skinless chicken or turkey. Ground chicken or turkey. Pork trimmed of fat. All fish and seafood. Eggs. Dried beans, peas, or lentils. Unsalted nuts and seeds. Unsalted canned beans. Dairy  Low-fat dairy products, such as skim or 1% milk, 2% or reduced-fat cheeses, low-fat ricotta or cottage cheese, or plain low-fat yogurt. Low-sodium or reduced-sodium cheeses. Fats and Oils  Tub margarines without trans fats. Light or reduced-fat mayonnaise and salad dressings (reduced sodium). Avocado. Safflower, olive, or canola oils. Natural peanut or almond butter. Other  Unsalted popcorn and pretzels. The items listed above may not be a complete list of recommended foods or beverages. Contact your dietitian for more options.  What foods are not recommended? Grains  White bread. White pasta. White rice. Refined cornbread. Bagels and croissants. Crackers that contain trans fat. Vegetables  Creamed or fried vegetables. Vegetables in a cheese sauce. Regular canned vegetables. Regular canned tomato sauce and paste. Regular tomato and vegetable juices. Fruits  Canned fruit in light or heavy syrup. Fruit juice. Meat and Other Protein Products  Fatty cuts of meat. Ribs, chicken wings, bacon, sausage, bologna, salami, chitterlings, fatback, hot dogs, bratwurst, and packaged luncheon meats. Salted nuts and seeds. Canned beans with salt. Dairy  Whole or 2% milk, cream, half-and-half, and cream cheese. Whole-fat or sweetened yogurt. Full-fat cheeses   or blue cheese. Nondairy creamers and whipped toppings. Processed cheese, cheese spreads, or cheese curds. Condiments  Onion and garlic  salt, seasoned salt, table salt, and sea salt. Canned and packaged gravies. Worcestershire sauce. Tartar sauce. Barbecue sauce. Teriyaki sauce. Soy sauce, including reduced sodium. Steak sauce. Fish sauce. Oyster sauce. Cocktail sauce. Horseradish. Ketchup and mustard. Meat flavorings and tenderizers. Bouillon cubes. Hot sauce. Tabasco sauce. Marinades. Taco seasonings. Relishes. Fats and Oils  Butter, stick margarine, lard, shortening, ghee, and bacon fat. Coconut, palm kernel, or palm oils. Regular salad dressings. Other  Pickles and olives. Salted popcorn and pretzels. The items listed above may not be a complete list of foods and beverages to avoid. Contact your dietitian for more information.  Where can I find more information? National Heart, Lung, and Blood Institute: www.nhlbi.nih.gov/health/health-topics/topics/dash/ This information is not intended to replace advice given to you by your health care provider. Make sure you discuss any questions you have with your health care provider. Document Released: 11/24/2011 Document Revised: 05/12/2016 Document Reviewed: 10/09/2013 Elsevier Interactive Patient Education  2017 Elsevier Inc.  

## 2017-02-08 ENCOUNTER — Telehealth: Payer: Self-pay | Admitting: Physician Assistant

## 2017-02-08 DIAGNOSIS — I10 Essential (primary) hypertension: Secondary | ICD-10-CM

## 2017-02-08 MED ORDER — METOPROLOL SUCCINATE ER 25 MG PO TB24: 25.0000 mg | ORAL_TABLET | Freq: Every day | ORAL | 0 refills | Status: DC

## 2017-02-08 NOTE — Telephone Encounter (Signed)
Patient may discontinue. Will send in another medication for her to try. Medication is called metoprolol. She is to take once daily. Fatigue may occur and is transient normally lasting first week.

## 2017-02-08 NOTE — Telephone Encounter (Signed)
Please Review

## 2017-02-08 NOTE — Telephone Encounter (Signed)
Pt stated that she has been taking amLODipine (NORVASC) 5 MG tablet as directed since 01/28/17 but now she feels the medication has caused her to feel nauseous and at times stomach pains. Pt stated she last took the medication yesterday 02/07/17. Please advise. Thanks TNP

## 2017-02-08 NOTE — Telephone Encounter (Signed)
Patient advised as directed below. She reports will give it a try.  Thanks,  -Joseline

## 2017-03-07 ENCOUNTER — Other Ambulatory Visit: Payer: Self-pay | Admitting: Physician Assistant

## 2017-03-07 DIAGNOSIS — I10 Essential (primary) hypertension: Secondary | ICD-10-CM

## 2017-03-07 NOTE — Telephone Encounter (Signed)
LOV 01/27/2017. Last refill was 02/08/2017, with no refills after having adverse side effect to Amlodipine. Renaldo Fiddler, CMA

## 2017-03-15 ENCOUNTER — Inpatient Hospital Stay: Payer: Self-pay

## 2017-03-15 ENCOUNTER — Encounter: Payer: Self-pay | Admitting: General Surgery

## 2017-03-15 ENCOUNTER — Ambulatory Visit (INDEPENDENT_AMBULATORY_CARE_PROVIDER_SITE_OTHER): Payer: BLUE CROSS/BLUE SHIELD | Admitting: General Surgery

## 2017-03-15 VITALS — BP 152/92 | HR 84 | Resp 12 | Ht 63.0 in | Wt 185.0 lb

## 2017-03-15 DIAGNOSIS — N6310 Unspecified lump in the right breast, unspecified quadrant: Secondary | ICD-10-CM

## 2017-03-15 DIAGNOSIS — L02213 Cutaneous abscess of chest wall: Secondary | ICD-10-CM | POA: Diagnosis not present

## 2017-03-15 MED ORDER — SULFAMETHOXAZOLE-TRIMETHOPRIM 800-160 MG PO TABS: 1.0000 | ORAL_TABLET | Freq: Two times a day (BID) | ORAL | 0 refills | Status: DC

## 2017-03-15 NOTE — Progress Notes (Signed)
Patient ID: Heather Burke, female   DOB: 1951/01/05, 66 y.o.   MRN: [id number]  Chief Complaint  Patient presents with  . Follow-up    HPI Heather Burke is a 66 y.o. female.  who presents for a follow up breast cyst and breast evaluation. The most recent right mammogram was done on 05-25-17. She states that over the weekend her right breast became red, swollen and tender. She has a known cyst there as well. Patient does perform regular self breast checks and gets regular mammograms done.     HPI  Past Medical History:  Diagnosis Date  . Cancer ([place]) 06/08/2015   Mammoprint: Low risk,T1b, N0;  ER+,PR+, Her 2 neg (FISH neg) x2.  Marland Kitchen GERD (gastroesophageal reflux disease)   . Heart murmur 2016   newly diagnosed, slight murmur  . Hyperlipidemia   . Hypertension   . Hypothyroidism   . Lupus   . PONV (postoperative nausea and vomiting)    with Hysterectomy  . Thyroid disease     Past Surgical History:  Procedure Laterality Date  . ABDOMINAL HYSTERECTOMY    . BREAST BIOPSY Right 2002   negative/ Dr. Bary Castilla  . BREAST BIOPSY Left 06-08-15   INVASIVE MAMMARY CARCINOMA WITH PARTIAL SOLID PATTERN.   . COLONOSCOPY WITH PROPOFOL N/A 09/13/2016   Procedure: COLONOSCOPY WITH PROPOFOL;  Surgeon: Lucilla Lame, MD;  Location: [facility] ENDOSCOPY;  Service: Endoscopy;  Laterality: N/A;  . core biopsy Left 06/08/15  . GUM SURGERY  2006  . MASTECTOMY Left   . SENTINEL NODE BIOPSY Left 08/18/2015   Procedure: SENTINEL NODE BIOPSY;  Surgeon: [name], MD;  Location: [facility] ORS;  Service: General;  Laterality: Left;  . SIMPLE MASTECTOMY WITH AXILLARY SENTINEL NODE BIOPSY Left 08/18/2015   Procedure: SIMPLE MASTECTOMY;  Surgeon: [name], MD;  Location: [facility] ORS;  Service: General;  Laterality: Left;  . TONSILLECTOMY      Family History  Problem Relation Age of Onset  . Cancer Father     prostate  . Thyroid disease Daughter   . Heart attack Sister   . Breast cancer Cousin      Social History Social History  Substance Use Topics  . Smoking status: Current Every Day Smoker    Packs/day: 2.00    Years: 44.00    Types: Cigarettes  . Smokeless tobacco: Never Used  . Alcohol use 0.0 oz/week     Comment: 3-4 beers/day    Allergies  Allergen Reactions  . Amlodipine Nausea Only and Other (See Comments)    Stomach cramping  . Atorvastatin Nausea And Vomiting  . Lisinopril Cough  . Pravastatin Nausea And Vomiting    Current Outpatient Prescriptions  Medication Sig Dispense Refill  . aspirin 81 MG tablet Take 81 mg by mouth daily.    . calcium carbonate (OS-CAL) 600 MG TABS tablet Take 1 tablet (600 mg total) by mouth 2 (two) times daily with a meal. 60 tablet   . Cholecalciferol (D3 ADULT PO) Take 1 tablet by mouth daily.     Marland Kitchen FIBER PO Take by mouth.    . fluticasone (FLONASE) 50 MCG/ACT nasal spray PLACE 2 SPRAYS INTO BOTH NOSTRILS DAILY. 48 g 1  . KLOR-CON M20 20 MEQ tablet Take 20 mEq by mouth daily.  1  . losartan-hydrochlorothiazide (HYZAAR) 100-25 MG tablet Take 1 tablet by mouth daily. 90 tablet 1  . lovastatin (MEVACOR) 20 MG tablet TAKE 1 TABLET (20 MG TOTAL) BY MOUTH AT BEDTIME.  90 tablet 1  . metFORMIN (GLUCOPHAGE) 500 MG tablet TAKE 1 TABLET (500 MG TOTAL) BY MOUTH 2 (TWO) TIMES DAILY WITH A MEAL. 180 tablet 1  . metoprolol succinate (TOPROL-XL) 25 MG 24 hr tablet TAKE 1 TABLET (25 MG TOTAL) BY MOUTH DAILY. 30 tablet 0  . Omega-3 Fatty Acids (FISH OIL) 1200 MG CAPS Take by mouth daily.    . potassium chloride (K-DUR,KLOR-CON) 10 MEQ tablet Take 1 tablet (10 mEq total) by mouth daily. 90 tablet 1  . SYNTHROID 88 MCG tablet TAKE 1 TABLET (88 MCG TOTAL) BY MOUTH DAILY BEFORE BREAKFAST. 90 tablet 1  . tamoxifen (NOLVADEX) 20 MG tablet Take 1 tablet (20 mg total) by mouth daily. 90 tablet 3  . varenicline (CHANTIX CONTINUING MONTH PAK) 1 MG tablet Take 1 tablet (1 mg total) by mouth 2 (two) times daily. 60 tablet 2  . sulfamethoxazole-trimethoprim  (BACTRIM DS) 800-160 MG tablet Take 1 tablet by mouth 2 (two) times daily. 6 tablet 0   No current facility-administered medications for this visit.     Review of Systems Review of Systems  Constitutional: Negative.   Respiratory: Negative.   Cardiovascular: Negative.     Blood pressure (!) 152/92, pulse 84, resp. rate 12, height 5\' 3"  (1.6 m), weight 185 lb (83.9 kg).  Physical Exam Physical Exam  Constitutional: She is oriented to person, place, and time. She appears well-developed and well-nourished.  Pulmonary/Chest:    2 cm swollen fluctuant area on medial aspect of right breast.  Neurological: She is alert and oriented to person, place, and time.  Skin: Skin is warm and dry.  Psychiatric: Her behavior is normal.    Data Reviewed The area was cleansed with alcohol followed by the instillation of 10 mL of 0.5% Xylocaine with 0.25% Marcaine with 1-200,000 epinephrine. A transverse incision was made over the fluctuant area with release of small amount amount of purulent fluid. The cyst cavity was debrided to remove all of the keratinaceous material. Dry dressing applied.    Assessment    Inflamed sebaceous cyst.    Plan    The patient will be placed on Bactrim DS 1 by mouth twice a day 3 days. Wound care reviewed. If this resolves as anticipated we'll plan for follow-up in August 2018 as previously scheduled. If there is any delay in healing she will call early for reassessment.   Bactrim DS BID #6 Follow up as needed or sooner if area does not heal. The patient is aware to call back for any questions or new concerns.   This information has been scribed by Karie Fetch RN, BSN,BC.   [name] 03/15/2017, 9:52 AM

## 2017-03-15 NOTE — Patient Instructions (Addendum)
The patient is aware to call back for any questions or concerns. Keep area clean May shower Change dressing as needed

## 2017-04-03 ENCOUNTER — Other Ambulatory Visit: Payer: Self-pay | Admitting: Physician Assistant

## 2017-04-03 DIAGNOSIS — I10 Essential (primary) hypertension: Secondary | ICD-10-CM

## 2017-04-03 MED ORDER — METOPROLOL SUCCINATE ER 25 MG PO TB24
25.0000 mg | ORAL_TABLET | Freq: Every day | ORAL | 1 refills | Status: DC
Start: 2017-04-03 — End: 2017-07-28

## 2017-04-03 NOTE — Telephone Encounter (Signed)
[facility] faxed a request for a 90-days supply for the following medication. Thanks CC  metoprolol succinate (TOPROL-XL) 25 MG 24 hr tablet  Take 1 tablet ( 25 MG total ) by mouth daily.

## 2017-04-21 ENCOUNTER — Other Ambulatory Visit: Payer: BLUE CROSS/BLUE SHIELD

## 2017-04-21 ENCOUNTER — Ambulatory Visit: Payer: BLUE CROSS/BLUE SHIELD | Admitting: Hematology and Oncology

## 2017-04-24 ENCOUNTER — Other Ambulatory Visit: Payer: Self-pay | Admitting: Physician Assistant

## 2017-04-24 DIAGNOSIS — E039 Hypothyroidism, unspecified: Secondary | ICD-10-CM

## 2017-04-25 ENCOUNTER — Telehealth: Payer: Self-pay

## 2017-04-25 DIAGNOSIS — E876 Hypokalemia: Secondary | ICD-10-CM

## 2017-04-25 NOTE — Telephone Encounter (Signed)
Patient is calling that she already took almost a three month supply of the Potassium medication. She reports that [facility] called her yesterday to let her know they had given her the wrong dosage from what the provider had prescribed. The pharmacy gave Potassium Chloride 20 MEQ and Jenni had prescribed 10 MEG. Patient denies vomiting, diarrhea or upset stomach. She reports having some lightheadenes sometimes. Informed Tawanna Sat and per Tawanna Sat patient should be ok to take this dosage is not going to harm her specially if she doesn't have no symptoms. And that we can order labs to check her Potassium level. Per patient she agrees and is going to stop by this afternoon to get her labs drawn.  Thanks,  -Arvell Pulsifer

## 2017-04-25 NOTE — Telephone Encounter (Signed)
Potassium ordered.

## 2017-04-28 LAB — POTASSIUM: Potassium: 3.6 mmol/L (ref 3.5–5.2)

## 2017-05-25 ENCOUNTER — Ambulatory Visit
Admission: RE | Admit: 2017-05-25 | Discharge: 2017-05-25 | Disposition: A | Discharge status: Home / Self Care | Payer: PPO | Source: Ambulatory Visit | Attending: Hematology and Oncology | Admitting: Hematology and Oncology

## 2017-05-25 DIAGNOSIS — C50912 Malignant neoplasm of unspecified site of left female breast: Secondary | ICD-10-CM

## 2017-05-25 DIAGNOSIS — Z1231 Encounter for screening mammogram for malignant neoplasm of breast: Secondary | ICD-10-CM | POA: Diagnosis not present

## 2017-05-25 DIAGNOSIS — Z170 Estrogen receptor positive status [ER+]: Secondary | ICD-10-CM

## 2017-05-25 HISTORY — DX: Malignant neoplasm of unspecified site of unspecified female breast: C50.919

## 2017-05-29 ENCOUNTER — Other Ambulatory Visit: Payer: BLUE CROSS/BLUE SHIELD

## 2017-05-29 ENCOUNTER — Ambulatory Visit: Payer: BLUE CROSS/BLUE SHIELD | Admitting: Hematology and Oncology

## 2017-06-12 ENCOUNTER — Other Ambulatory Visit: Payer: Self-pay | Admitting: Physician Assistant

## 2017-06-12 DIAGNOSIS — E785 Hyperlipidemia, unspecified: Secondary | ICD-10-CM

## 2017-06-12 NOTE — Telephone Encounter (Signed)
Last ov 01/27/17 Last filled 12/20/16 Please review. Thank you. sd

## 2017-06-14 ENCOUNTER — Other Ambulatory Visit: Payer: Self-pay | Admitting: Physician Assistant

## 2017-06-16 ENCOUNTER — Other Ambulatory Visit: Payer: Self-pay | Admitting: Physician Assistant

## 2017-06-16 DIAGNOSIS — J309 Allergic rhinitis, unspecified: Secondary | ICD-10-CM

## 2017-06-22 ENCOUNTER — Encounter: Payer: Self-pay | Admitting: General Surgery

## 2017-06-22 ENCOUNTER — Ambulatory Visit (INDEPENDENT_AMBULATORY_CARE_PROVIDER_SITE_OTHER): Payer: PPO | Admitting: General Surgery

## 2017-06-22 VITALS — BP 138/62 | HR 82 | Resp 14 | Ht 63.0 in | Wt 183.0 lb

## 2017-06-22 DIAGNOSIS — C50812 Malignant neoplasm of overlapping sites of left female breast: Secondary | ICD-10-CM

## 2017-06-22 DIAGNOSIS — C50912 Malignant neoplasm of unspecified site of left female breast: Secondary | ICD-10-CM

## 2017-06-22 NOTE — Patient Instructions (Signed)
The patient is aware to call back for any questions or concerns.  

## 2017-06-22 NOTE — Progress Notes (Signed)
Patient ID: Heather Burke, female   DOB: Jul 25, 1951, 66 y.o.   MRN: [id number]  Chief Complaint  Patient presents with  . Follow-up    mammogram    HPI Heather Burke is a 66 y.o. female who presents for her follow up breast cancer and breast evaluation. The most recent mammogram was done on 05-25-17.  Patient does perform regular self breast checks and gets regular mammograms done.  No new breast issues. Tolerating tamoxifen. (Unable to tolerate aromatase inhibitors)   She states she can still get a little drainage when she squeezes the cyst area mid chest.  Bone density January 2017: -0.2, normal.   :HPI  Past Medical History:  Diagnosis Date  . Breast cancer ([place])   . Cancer ([place]) 06/08/2015   T1c, N0;  ER+,PR+, Her 2 neg (FISH neg) x2., SLN x 3 negative. Mammoprint: Low risk. Multifocal.Invasive mammary and lobular carcinoma.   Marland Kitchen GERD (gastroesophageal reflux disease)   . Heart murmur 2016   newly diagnosed, slight murmur  . Hyperlipidemia   . Hypertension   . Hypothyroidism   . Lupus   . PONV (postoperative nausea and vomiting)    with Hysterectomy  . Thyroid disease     Past Surgical History:  Procedure Laterality Date  . ABDOMINAL HYSTERECTOMY    . BREAST BIOPSY Right 2002   negative/ Dr. Bary Castilla  . BREAST BIOPSY Left 06-08-15   INVASIVE MAMMARY CARCINOMA WITH PARTIAL SOLID PATTERN.   . COLONOSCOPY WITH PROPOFOL N/A 09/13/2016   Procedure: COLONOSCOPY WITH PROPOFOL;  Surgeon: Lucilla Lame, MD;  Location: [facility] ENDOSCOPY;  Service: Endoscopy;  Laterality: N/A;  . core biopsy Left 06/08/15  . GUM SURGERY  2006  . MASTECTOMY Left   . SENTINEL NODE BIOPSY Left 08/18/2015   Procedure: SENTINEL NODE BIOPSY;  Surgeon: [name], MD;  Location: [facility] ORS;  Service: General;  Laterality: Left;  . SIMPLE MASTECTOMY WITH AXILLARY SENTINEL NODE BIOPSY Left 08/18/2015   Procedure: SIMPLE MASTECTOMY;  Surgeon: [name], MD;  Location: [facility] ORS;  Service:  General;  Laterality: Left;  . TONSILLECTOMY      Family History  Problem Relation Age of Onset  . Cancer Father        prostate  . Thyroid disease Daughter   . Heart attack Sister   . Breast cancer Cousin   . Breast cancer Cousin   . Breast cancer Other     Social History Social History  Substance Use Topics  . Smoking status: Current Every Day Smoker    Packs/day: 2.00    Years: 44.00    Types: Cigarettes  . Smokeless tobacco: Never Used  . Alcohol use 0.0 oz/week     Comment: 3-4 beers/day    Allergies  Allergen Reactions  . Amlodipine Nausea Only and Other (See Comments)    Stomach cramping  . Atorvastatin Nausea And Vomiting  . Lisinopril Cough  . Pravastatin Nausea And Vomiting    Current Outpatient Prescriptions  Medication Sig Dispense Refill  . aspirin 81 MG tablet Take 81 mg by mouth daily.    . calcium carbonate (OS-CAL) 600 MG TABS tablet Take 1 tablet (600 mg total) by mouth 2 (two) times daily with a meal. 60 tablet   . Cholecalciferol (D3 ADULT PO) Take 1 tablet by mouth daily.     Marland Kitchen FIBER PO Take by mouth.    . fluticasone (FLONASE) 50 MCG/ACT nasal spray PLACE 2 SPRAYS INTO BOTH NOSTRILS DAILY. 48 g  1  . KLOR-CON M20 20 MEQ tablet Take 20 mEq by mouth daily.  1  . losartan-hydrochlorothiazide (HYZAAR) 100-25 MG tablet TAKE 1 TABLET BY MOUTH DAILY. 90 tablet 1  . lovastatin (MEVACOR) 20 MG tablet TAKE 1 TABLET (20 MG TOTAL) BY MOUTH AT BEDTIME. 90 tablet 1  . metFORMIN (GLUCOPHAGE) 500 MG tablet TAKE 1 TABLET (500 MG TOTAL) BY MOUTH 2 (TWO) TIMES DAILY WITH A MEAL. 180 tablet 1  . metoprolol succinate (TOPROL-XL) 25 MG 24 hr tablet Take 1 tablet (25 mg total) by mouth daily. 90 tablet 1  . Omega-3 Fatty Acids (FISH OIL) 1200 MG CAPS Take by mouth daily.    . potassium chloride (K-DUR,KLOR-CON) 10 MEQ tablet Take 1 tablet (10 mEq total) by mouth daily. 90 tablet 1  . SYNTHROID 88 MCG tablet TAKE 1 TABLET BY MOUTH EVERY DAY BEFORE BREAKFAST 90 tablet 1   . tamoxifen (NOLVADEX) 20 MG tablet Take 1 tablet (20 mg total) by mouth daily. 90 tablet 3  . Turmeric Curcumin 500 MG CAPS Take by mouth daily.    . varenicline (CHANTIX CONTINUING MONTH PAK) 1 MG tablet Take 1 tablet (1 mg total) by mouth 2 (two) times daily. 60 tablet 2   No current facility-administered medications for this visit.     Review of Systems Review of Systems  Constitutional: Negative.   Respiratory: Negative.   Cardiovascular: Negative.     Blood pressure 138/62, pulse 82, resp. rate 14, height 5\' 3"  (1.6 m), weight 183 lb (83 kg).  Physical Exam Physical Exam  Constitutional: She is oriented to person, place, and time. She appears well-developed and well-nourished.  HENT:  Mouth/Throat: Oropharynx is clear and moist.  Eyes: Conjunctivae are normal. No scleral icterus.  Neck: Neck supple.  Cardiovascular: Normal rate, regular rhythm and normal heart sounds.   Pulmonary/Chest: Effort normal and breath sounds normal. Right breast exhibits mass. Right breast exhibits no inverted nipple, no nipple discharge, no skin change and no tenderness.    Excellent shoulder range of motion.  Lymphadenopathy:    She has no cervical adenopathy.    She has no axillary adenopathy.  Neurological: She is alert and oriented to person, place, and time.  Skin: Skin is warm and dry.  Psychiatric: Her behavior is normal.    Data Reviewed Screening right breast mammogram dated 05/25/2017: Stable nodule upper-outer quadrant with biopsy clip. BI-RADS-1.  No evidence of recurrent breast cancer.  Plan    Mammograms are being scheduled by Dr. Mike Gip.  Patient felt the last breast prosthesis was too large and a new prescription has been provided.     Prescription given to patient for breast prothesis. The patient has been asked to return to the office in one year.  HPI, Physical Exam, Assessment and Plan have been scribed under the direction and in the presence of [name], MD. Karie Fetch, RN  I have completed the exam and reviewed the above documentation for accuracy and completeness.  I agree with the above.  Haematologist has been used and any errors in dictation or transcription are unintentional.  Hervey Ard, M.D., F.A.C.S.  [name] 06/22/2017, 10:13 AM

## 2017-07-07 ENCOUNTER — Inpatient Hospital Stay (HOSPITAL_BASED_OUTPATIENT_CLINIC_OR_DEPARTMENT_OTHER): Payer: PPO | Admitting: Hematology and Oncology

## 2017-07-07 ENCOUNTER — Inpatient Hospital Stay: Payer: PPO | Attending: Hematology and Oncology

## 2017-07-07 ENCOUNTER — Other Ambulatory Visit: Payer: Self-pay | Admitting: *Deleted

## 2017-07-07 ENCOUNTER — Encounter: Payer: Self-pay | Admitting: Hematology and Oncology

## 2017-07-07 VITALS — BP 189/93 | HR 76 | Temp 98.9°F | Wt 185.1 lb

## 2017-07-07 DIAGNOSIS — Z79810 Long term (current) use of selective estrogen receptor modulators (SERMs): Secondary | ICD-10-CM | POA: Insufficient documentation

## 2017-07-07 DIAGNOSIS — Z9012 Acquired absence of left breast and nipple: Secondary | ICD-10-CM | POA: Insufficient documentation

## 2017-07-07 DIAGNOSIS — Z79899 Other long term (current) drug therapy: Secondary | ICD-10-CM

## 2017-07-07 DIAGNOSIS — R200 Anesthesia of skin: Secondary | ICD-10-CM

## 2017-07-07 DIAGNOSIS — R011 Cardiac murmur, unspecified: Secondary | ICD-10-CM | POA: Diagnosis not present

## 2017-07-07 DIAGNOSIS — F17210 Nicotine dependence, cigarettes, uncomplicated: Secondary | ICD-10-CM | POA: Diagnosis not present

## 2017-07-07 DIAGNOSIS — Z7984 Long term (current) use of oral hypoglycemic drugs: Secondary | ICD-10-CM

## 2017-07-07 DIAGNOSIS — R232 Flushing: Secondary | ICD-10-CM

## 2017-07-07 DIAGNOSIS — C50912 Malignant neoplasm of unspecified site of left female breast: Secondary | ICD-10-CM | POA: Insufficient documentation

## 2017-07-07 DIAGNOSIS — Z170 Estrogen receptor positive status [ER+]: Secondary | ICD-10-CM

## 2017-07-07 DIAGNOSIS — E039 Hypothyroidism, unspecified: Secondary | ICD-10-CM | POA: Diagnosis not present

## 2017-07-07 DIAGNOSIS — E785 Hyperlipidemia, unspecified: Secondary | ICD-10-CM

## 2017-07-07 DIAGNOSIS — I10 Essential (primary) hypertension: Secondary | ICD-10-CM | POA: Insufficient documentation

## 2017-07-07 DIAGNOSIS — Z803 Family history of malignant neoplasm of breast: Secondary | ICD-10-CM | POA: Diagnosis not present

## 2017-07-07 DIAGNOSIS — Z853 Personal history of malignant neoplasm of breast: Secondary | ICD-10-CM

## 2017-07-07 DIAGNOSIS — K219 Gastro-esophageal reflux disease without esophagitis: Secondary | ICD-10-CM | POA: Insufficient documentation

## 2017-07-07 DIAGNOSIS — M329 Systemic lupus erythematosus, unspecified: Secondary | ICD-10-CM

## 2017-07-07 DIAGNOSIS — Z7982 Long term (current) use of aspirin: Secondary | ICD-10-CM | POA: Insufficient documentation

## 2017-07-07 LAB — COMPREHENSIVE METABOLIC PANEL
ALT: 27 U/L (ref 14–54)
AST: 39 U/L (ref 15–41)
Albumin: 3.9 g/dL (ref 3.5–5.0)
Alkaline Phosphatase: 43 U/L (ref 38–126)
Anion gap: 8 (ref 5–15)
BUN: 14 mg/dL (ref 6–20)
CALCIUM: 9.6 mg/dL (ref 8.9–10.3)
CHLORIDE: 98 mmol/L — AB (ref 101–111)
CO2: 29 mmol/L (ref 22–32)
CREATININE: 0.72 mg/dL (ref 0.44–1.00)
Glucose, Bld: 131 mg/dL — ABNORMAL HIGH (ref 65–99)
Potassium: 3.4 mmol/L — ABNORMAL LOW (ref 3.5–5.1)
Sodium: 135 mmol/L (ref 135–145)
Total Bilirubin: 0.5 mg/dL (ref 0.3–1.2)
Total Protein: 7.2 g/dL (ref 6.5–8.1)

## 2017-07-07 NOTE — Progress Notes (Signed)
Patient offers no complaints today.  Patient just had her mammogram and also has seen Dr. Bary Castilla.

## 2017-07-07 NOTE — Progress Notes (Signed)
[facility] day:  07/07/2017  Chief Complaint: Heather Burke is a 66 y.o. female with stage IA multi-focal left breast cancer who is seen for 6 month ssessment.  HPI:  The patient was last seen in the medical oncology clinic on 12/22/2016.  At that time, she was doing well.  She was trying to quit smoking.  Exam was stable.  CBC, LFTs, and CA27.29 was normal.  Potassium was 3.2.  She continued tamoxifen.  Right mammogram on 05/25/2017 revealed no evidence of malignancy.    During the interm, she has done well.  She denies any breast concerns.  She has some hot flashes on tamoxifen. She saw Dr. Tollie Pizza on 06/22/2017.   Past Medical History:  Diagnosis Date  . Breast cancer ([place])   . Cancer ([place]) 06/08/2015   T1c, N0;  ER+,PR+, Her 2 neg (FISH neg) x2., SLN x 3 negative. Mammoprint: Low risk. Multifocal.Invasive mammary and lobular carcinoma.   Marland Kitchen GERD (gastroesophageal reflux disease)   . Heart murmur 2016   newly diagnosed, slight murmur  . Hyperlipidemia   . Hypertension   . Hypothyroidism   . Lupus   . PONV (postoperative nausea and vomiting)    with Hysterectomy  . Thyroid disease     Past Surgical History:  Procedure Laterality Date  . ABDOMINAL HYSTERECTOMY    . BREAST BIOPSY Right 2002   negative/ Dr. Bary Castilla  . BREAST BIOPSY Left 06-08-15   INVASIVE MAMMARY CARCINOMA WITH PARTIAL SOLID PATTERN.   . COLONOSCOPY WITH PROPOFOL N/A 09/13/2016   Procedure: COLONOSCOPY WITH PROPOFOL;  Surgeon: Lucilla Lame, MD;  Location: [facility];  Service: Endoscopy;  Laterality: N/A;  . core biopsy Left 06/08/15  . GUM SURGERY  2006  . MASTECTOMY Left   . SENTINEL NODE BIOPSY Left 08/18/2015   Procedure: SENTINEL NODE BIOPSY;  Surgeon: [name], MD;  Location: [facility] ORS;  Service: General;  Laterality: Left;  . SIMPLE MASTECTOMY WITH AXILLARY SENTINEL NODE BIOPSY Left 08/18/2015   Procedure: SIMPLE MASTECTOMY;  Surgeon: [name], MD;  Location: [facility] ORS;  Service: General;  Laterality: Left;  . TONSILLECTOMY      Family History  Problem Relation Age of Onset  . Cancer Father        prostate  . Thyroid disease Daughter   . Heart attack Sister   . Breast cancer Cousin   . Breast cancer Cousin   . Breast cancer Other     Social History:  reports that she has been smoking Cigarettes.  She has a 88.00 pack-year smoking history. She has never used smokeless tobacco. She reports that she drinks alcohol. She reports that she does not use drugs.  She has smoked 2 packs per day since age 32.  She plans on taking Chantix.  Her niece is J.  Weaver.  She lives in [place].  The patient is alone today.  Allergies:  Allergies  Allergen Reactions  . Amlodipine Nausea Only and Other (See Comments)    Stomach cramping  . Atorvastatin Nausea And Vomiting  . Lisinopril Cough  . Pravastatin Nausea And Vomiting    Current Medications: Current Outpatient Prescriptions  Medication Sig Dispense Refill  . aspirin 81 MG tablet Take 81 mg by mouth daily.    . calcium carbonate (OS-CAL) 600 MG TABS tablet Take 1 tablet (600 mg total) by mouth 2 (two) times daily with a meal. 60 tablet   . Cholecalciferol (D3  ADULT PO) Take 1 tablet by mouth daily.     Marland Kitchen FIBER PO Take by mouth.    . fluticasone (FLONASE) 50 MCG/ACT nasal spray PLACE 2 SPRAYS INTO BOTH NOSTRILS DAILY. 48 g 1  . KLOR-CON M20 20 MEQ tablet Take 20 mEq by mouth daily.  1  . losartan-hydrochlorothiazide (HYZAAR) 100-25 MG tablet TAKE 1 TABLET BY MOUTH DAILY. 90 tablet 1  . lovastatin (MEVACOR) 20 MG tablet TAKE 1 TABLET (20 MG TOTAL) BY MOUTH AT BEDTIME. 90 tablet 1  . metFORMIN (GLUCOPHAGE) 500 MG tablet TAKE 1 TABLET (500 MG TOTAL) BY MOUTH 2 (TWO) TIMES DAILY WITH A MEAL. 180 tablet 1  . metoprolol succinate (TOPROL-XL) 25 MG 24 hr tablet Take 1 tablet (25 mg total) by mouth daily. 90 tablet 1  . Omega-3 Fatty Acids (FISH OIL) 1200 MG CAPS Take by mouth  daily.    . potassium chloride (K-DUR,KLOR-CON) 10 MEQ tablet Take 1 tablet (10 mEq total) by mouth daily. 90 tablet 1  . SYNTHROID 88 MCG tablet TAKE 1 TABLET BY MOUTH EVERY DAY BEFORE BREAKFAST 90 tablet 1  . tamoxifen (NOLVADEX) 20 MG tablet Take 1 tablet (20 mg total) by mouth daily. 90 tablet 3  . Turmeric Curcumin 500 MG CAPS Take by mouth daily.    . varenicline (CHANTIX CONTINUING MONTH PAK) 1 MG tablet Take 1 tablet (1 mg total) by mouth 2 (two) times daily. (Patient not taking: Reported on 07/07/2017) 60 tablet 2   No current facility-administered medications for this visit.     Review of Systems:  GENERAL:  Feels "fine".  No fevers or sweats.  Weight up 4 pounds. PERFORMANCE STATUS (ECOG): 0 HEENT:  No visual changes, runny nose, sore throat, mouth sores or tenderness. Lungs: No shortness of breath or cough.  No hemoptysis. Cardiac:  No chest pain, palpitations, orthopnea, or PND.  Blood pressure varies. GI:  No nausea, vomiting, diarrhea, constipation, melena or hematochezia. GU:  No urgency, frequency, dysuria, or hematuria. Musculoskeletal:  No back pain.  No joint pain.  No muscle tenderness. Extremities:  No pain or swelling. Skin:  No rashes or skin changes. Neuro:  No headache, numbness or weakness, balance or coordination issues. Endocrine:  Diabetes.  Thyroid disease on Synthroid. Hot  flashes.   No night sweats. Psych:  No mood changes, depression or anxiety. Pain:  No focal pain. Review of systems:  All other systems reviewed and found to be negative.  Physical Exam: Blood pressure (!) 189/93, pulse 76, temperature 98.9 F (37.2 C), temperature source Tympanic, weight 185 lb 2 oz (84 kg). GENERAL:  Well developed, well nourished, woman sitting comfortably in the exam room in no acute distress. MENTAL STATUS:  Alert and oriented to person, place and time. HEAD:  Shoulder length brown hair with highlights.  Normocephalic, atraumatic, face symmetric, no Cushingoid  features. EYES:  Blue eyes.  Pupils equal round and reactive to light and accomodation.  No conjunctivitis or scleral icterus. ENT:  Oropharynx clear without lesion.  Tongue normal. Mucous membranes moist.  RESPIRATORY:  Clear to auscultation without rales, wheezes or rhonchi. CARDIOVASCULAR:  Regular rate and rhythm without murmur, rub or gallop. BREAST:  Right breast s/p cyst removal medially with slight scarring.  Inferior fibrocystic changes.  No masses, skin changes or nipple discharge. s/p left mastectomy.  Cyst with cornified center (stable).  No erythema or nodularity.  ABDOMEN:  Soft, non-tender, with active bowel sounds, and no hepatosplenomegaly.  No masses. SKIN:  No  rashes, ulcers or lesions. EXTREMITIES: No edema, no skin discoloration or tenderness.  No palpable cords. LYMPH NODES: No palpable cervical, supraclavicular, axillary or inguinal adenopathy  NEUROLOGICAL: Unremarkable. PSYCH:  Appropriate.   Appointment on 07/07/2017  Component Date Value Ref Range Status  . Sodium 07/07/2017 135  135 - 145 mmol/L Final  . Potassium 07/07/2017 3.4* 3.5 - 5.1 mmol/L Final  . Chloride 07/07/2017 98* 101 - 111 mmol/L Final  . CO2 07/07/2017 29  22 - 32 mmol/L Final  . Glucose, Bld 07/07/2017 131* 65 - 99 mg/dL Final  . BUN 07/07/2017 14  6 - 20 mg/dL Final  . Creatinine, Ser 07/07/2017 0.72  0.44 - 1.00 mg/dL Final  . Calcium 07/07/2017 9.6  8.9 - 10.3 mg/dL Final  . Total Protein 07/07/2017 7.2  6.5 - 8.1 g/dL Final  . Albumin 07/07/2017 3.9  3.5 - 5.0 g/dL Final  . AST 07/07/2017 39  15 - 41 U/L Final  . ALT 07/07/2017 27  14 - 54 U/L Final  . Alkaline Phosphatase 07/07/2017 43  38 - 126 U/L Final  . Total Bilirubin 07/07/2017 0.5  0.3 - 1.2 mg/dL Final  . GFR calc non Af Amer 07/07/2017 >60  >60 mL/min Final  . GFR calc Af Amer 07/07/2017 >60  >60 mL/min Final   Comment: (NOTE) The eGFR has been calculated using the CKD EPI equation. This calculation has not been validated  in all clinical situations. eGFR's persistently <60 mL/min signify possible Chronic Kidney Disease.   Georgiann Hahn gap 07/07/2017 8  5 - 15 Final    Assessment:  WAVA KILDOW is a 66 y.o. female with stage IA multi-focal left breast cancer s/p left mastectomy on 08/18/2015.  Pathology revealed three separate foci of lobular cancer (18 mm, 9 mm, 0.9 mm) and one focus of invasive mammary carcinoma (5 mm).  One lymph node was negative for malignancy.  ER and PR receptors were positive.  HER-2/neu was negative.  Pathologic stage was T1cN0M0.  MammaPrint on 08/28/2015 revealed low risk luminal-type (A).   She was on letrozole (08/2015) then Aromasin (02/2016).  She developed numbness in upper extremity (noticed more in right hand). Tamoxifen was started in 03/2016 or 04/2016.  She notes that her hands still hurt when taking a child proof cap off.  She has hot flashes.  Right sided mammogram on on 05/25/2017 revealed no evidence of malignancy. She has a colonoscopy planned on 09/20/2016.  CA27.29 is followed:  30.2 on 08/25/2016, 29.3 on 12/22/2016.  Bone density on 01/07/2016 was normal with a T-score of -0.2 in the right femoral neck.  L-4 was excluded due to degenerative disease.  She is on calcium and vitamin D.  Symptomatically, she is doing well.  Exam is stable.  Potassium is slightly low.  Plan: 1.  Labs today:  CBC with diff, CMP, CA27.29. 2.  Continue tamoxifen. 3.  Discuss potassium rich foods. 4.  RTC in 6 months for MD assessment and labs (CBC with diff, CMP, CA27.29).   Heather Asal, MD  07/07/2017, 11:07 AM

## 2017-07-13 ENCOUNTER — Ambulatory Visit: Payer: BLUE CROSS/BLUE SHIELD | Admitting: General Surgery

## 2017-07-13 LAB — CANCER ANTIGEN 27.29: CA 27.29: 27.2 U/mL (ref 0.0–38.6)

## 2017-07-14 ENCOUNTER — Telehealth: Payer: Self-pay | Admitting: *Deleted

## 2017-07-14 NOTE — Telephone Encounter (Signed)
Called patient to inform her that her tumor marker is normal.

## 2017-07-14 NOTE — Telephone Encounter (Signed)
-----   Message from Lequita Asal, MD sent at 07/14/2017 12:04 AM EDT ----- Regarding: Please call patient  CA27.29 is normal  ----- Message ----- From: Sindy Guadeloupe, MD Sent: 07/13/2017   9:25 PM To: Lequita Asal, MD    ----- Message ----- From: Buel Ream, Lab In [place] Sent: 07/07/2017  10:43 AM To: Sindy Guadeloupe, MD

## 2017-07-21 ENCOUNTER — Ambulatory Visit (INDEPENDENT_AMBULATORY_CARE_PROVIDER_SITE_OTHER): Payer: PPO

## 2017-07-21 VITALS — BP 184/100 | HR 76 | Temp 99.3°F | Ht 63.0 in | Wt 185.2 lb

## 2017-07-21 DIAGNOSIS — Z23 Encounter for immunization: Secondary | ICD-10-CM | POA: Diagnosis not present

## 2017-07-21 DIAGNOSIS — Z0000 Encounter for general adult medical examination without abnormal findings: Secondary | ICD-10-CM | POA: Diagnosis not present

## 2017-07-21 NOTE — Progress Notes (Signed)
Subjective:   Heather Burke is a 66 y.o. female who presents for Medicare Annual (Subsequent) preventive examination.  Review of Systems:  N/A Cardiac Risk Factors include: advanced age (>46men, >52 women);diabetes mellitus;dyslipidemia;hypertension;smoking/ tobacco exposure;obesity (BMI >30kg/m2)     Objective:     Vitals: BP (!) 184/100   Pulse 76   Temp 99.3 F (37.4 C) (Oral)   Ht 5\' 3"  (1.6 m)   Wt 185 lb 4 oz (84 kg)   BMI 32.82 kg/m   Body mass index is 32.82 kg/m.   Tobacco History  Smoking Status  . Current Every Day Smoker  . Packs/day: 2.00  . Years: 44.00  . Types: Cigarettes  Smokeless Tobacco  . Never Used     Ready to quit: No Counseling given: Not Answered   Past Medical History:  Diagnosis Date  . Breast cancer ([place])   . Cancer ([place]) 06/08/2015   T1c, N0;  ER+,PR+, Her 2 neg (FISH neg) x2., SLN x 3 negative. Mammoprint: Low risk. Multifocal.Invasive mammary and lobular carcinoma.   Marland Kitchen GERD (gastroesophageal reflux disease)   . Heart murmur 2016   newly diagnosed, slight murmur  . Hyperlipidemia   . Hypertension   . Hypothyroidism   . Lupus   . PONV (postoperative nausea and vomiting)    with Hysterectomy  . Thyroid disease    Past Surgical History:  Procedure Laterality Date  . ABDOMINAL HYSTERECTOMY    . BREAST BIOPSY Right 2002   negative/ Dr. Bary Castilla  . BREAST BIOPSY Left 06-08-15   INVASIVE MAMMARY CARCINOMA WITH PARTIAL SOLID PATTERN.   . COLONOSCOPY WITH PROPOFOL N/A 09/13/2016   Procedure: COLONOSCOPY WITH PROPOFOL;  Surgeon: Lucilla Lame, MD;  Location: [facility];  Service: Endoscopy;  Laterality: N/A;  . core biopsy Left 06/08/15  . GUM SURGERY  2006  . MASTECTOMY Left   . SENTINEL NODE BIOPSY Left 08/18/2015   Procedure: SENTINEL NODE BIOPSY;  Surgeon: [name], MD;  Location: [facility] ORS;  Service: General;  Laterality: Left;  . SIMPLE MASTECTOMY WITH AXILLARY SENTINEL NODE BIOPSY Left 08/18/2015   Procedure:  SIMPLE MASTECTOMY;  Surgeon: [name], MD;  Location: [facility] ORS;  Service: General;  Laterality: Left;  . TONSILLECTOMY     Family History  Problem Relation Age of Onset  . Cancer Father        prostate  . Thyroid disease Daughter   . Heart attack Sister   . Breast cancer Cousin   . Breast cancer Cousin   . Breast cancer Other    History  Sexual Activity  . Sexual activity: Not on file    Outpatient Encounter Prescriptions as of 07/21/2017  Medication Sig  . aspirin 81 MG tablet Take 81 mg by mouth daily.  . calcium carbonate (OS-CAL) 600 MG TABS tablet Take 1 tablet (600 mg total) by mouth 2 (two) times daily with a meal.  . Cholecalciferol (D3 ADULT PO) Take 1 tablet by mouth daily. 1000 units daily  . FIBER PO Take by mouth.  . fluticasone (FLONASE) 50 MCG/ACT nasal spray PLACE 2 SPRAYS INTO BOTH NOSTRILS DAILY.  Marland Kitchen losartan-hydrochlorothiazide (HYZAAR) 100-25 MG tablet TAKE 1 TABLET BY MOUTH DAILY.  Marland Kitchen lovastatin (MEVACOR) 20 MG tablet TAKE 1 TABLET (20 MG TOTAL) BY MOUTH AT BEDTIME.  . metFORMIN (GLUCOPHAGE) 500 MG tablet TAKE 1 TABLET (500 MG TOTAL) BY MOUTH 2 (TWO) TIMES DAILY WITH A MEAL.  . metoprolol succinate (TOPROL-XL) 25 MG 24 hr tablet Take  1 tablet (25 mg total) by mouth daily.  . Omega-3 Fatty Acids (FISH OIL) 1200 MG CAPS Take by mouth daily. 1200 mg TID  . potassium chloride (K-DUR,KLOR-CON) 10 MEQ tablet Take 1 tablet (10 mEq total) by mouth daily.  Marland Kitchen SYNTHROID 88 MCG tablet TAKE 1 TABLET BY MOUTH EVERY DAY BEFORE BREAKFAST  . tamoxifen (NOLVADEX) 20 MG tablet Take 1 tablet (20 mg total) by mouth daily.  . Turmeric Curcumin 500 MG CAPS Take by mouth daily.  . [DISCONTINUED] KLOR-CON M20 20 MEQ tablet Take 20 mEq by mouth daily.  . [DISCONTINUED] varenicline (CHANTIX CONTINUING MONTH PAK) 1 MG tablet Take 1 tablet (1 mg total) by mouth 2 (two) times daily. (Patient not taking: Reported on 07/07/2017)   No facility-administered encounter medications on file  as of 07/21/2017.     Activities of Daily Living In your present state of health, do you have any difficulty performing the following activities: 07/21/2017 07/21/2017  Hearing? N N  Vision? N N  Difficulty concentrating or making decisions? N N  Walking or climbing stairs? Y Y  Dressing or bathing? N N  Doing errands, shopping? N N  Preparing Food and eating ? N -  Using the Toilet? N -  In the past six months, have you accidently leaked urine? Y -  Comment when sneezing or coughing only -  Do you have problems with loss of bowel control? N -  Managing your Medications? N -  Managing your Finances? N -  Housekeeping or managing your Housekeeping? N -  Some recent data might be hidden    Patient Care Team: [name], PA-C as PCP - General (Family Medicine) Bary Castilla, Forest Gleason, MD (General Surgery) Lorelee Cover., MD as Consulting Physician (Ophthalmology) Lequita Asal, MD as Referring Physician (Hematology and Oncology)    Assessment:     Exercise Activities and Dietary recommendations Current Exercise Habits: The patient does not participate in regular exercise at present (Patient is very busy and active ), Exercise limited by: None identified  Goals    . Reduce sugar intake           Recommend decreasing junk food as snacks and increasing more healthy snacks.       Fall Risk Fall Risk  07/21/2017 07/21/2017  Falls in the past year? No No   Depression Screen PHQ 2/9 Scores 07/21/2017 07/21/2017  PHQ - 2 Score 0 0     Cognitive Function: Patient declined screening.         Immunization History  Administered Date(s) Administered  . Influenza Split 10/21/2010, 10/13/2012  . Influenza,inj,Quad PF,36+ Mos 10/02/2014, 09/09/2015  . Influenza-Unspecified 09/21/2016  . Pneumococcal Conjugate-13 07/21/2017  . Pneumococcal Polysaccharide-23 01/22/2013  . Tdap 10/21/2010   Screening Tests Health Maintenance  Topic Date Due  . OPHTHALMOLOGY EXAM   05/22/1962  . HIV Screening  05/23/1967  . PAP SMEAR  12/21/2014  . FOOT EXAM  12/31/2016  . PNA vac Low Risk Adult (1 of 2 - PCV13) 05/22/2017  . INFLUENZA VACCINE  07/19/2017  . HEMOGLOBIN A1C  07/27/2017  . MAMMOGRAM  05/26/2019  . TETANUS/TDAP  10/21/2020  . COLONOSCOPY  09/13/2026  . DEXA SCAN  Completed  . Hepatitis C Screening  Completed      Plan:     I have personally reviewed and addressed the Medicare Annual Wellness questionnaire and have noted the following in the patient's chart:  A. Medical and social history B. Use of alcohol,  tobacco or illicit drugs  C. Current medications and supplements D. Functional ability and status E.  Nutritional status F.  Physical activity G. Advance directives H. List of other physicians I.  Hospitalizations, surgeries, and ER visits in previous 12 months J.  [place] such as hearing and vision if needed, cognitive and depression L. Referrals and appointments - none  In addition, I have reviewed and discussed with patient certain preventive protocols, quality metrics, and best practice recommendations. A written personalized care plan for preventive services as well as general preventive health recommendations were provided to patient.  See attached scanned questionnaire for additional information.   Signed,  Fabio Neighbors, LPN Nurse Health Advisor   MD Recommendations:

## 2017-07-21 NOTE — Patient Instructions (Addendum)
Heather Burke , Thank you for taking time to come for your Medicare Wellness Visit. I appreciate your ongoing commitment to your health goals. Please review the following plan we discussed and let me know if I can assist you in the future.   Screening recommendations/referrals: Colonoscopy: completed, due 08/2026 Mammogram: completed, due 05/2018  Bone Density: completed 01/07/2016 Recommended yearly ophthalmology/optometry visit for glaucoma screening and checkup Recommended yearly dental visit for hygiene and checkup  Vaccinations: Influenza vaccine: due 08/2017 Pneumococcal vaccine: Prevnar 13 given today Tdap vaccine: completed, next due 10/2021 Shingles vaccine: declined  Advanced directives: Advance directive discussed with you today. Even though you declined this today please call our office should you change your mind and we can give you the proper paperwork for you to fill out.  Conditions/risks identified: Recommend decreasing junk food as snacks and increasing more healthy snacks.  Next appointment: 07/28/2017 @ 10 am.    Preventive Care 65 Years and Older, Female Preventive care refers to lifestyle choices and visits with your health care provider that can promote health and wellness. What does preventive care include?  A yearly physical exam. This is also called an annual well check.  Dental exams once or twice a year.  Routine eye exams. Ask your health care provider how often you should have your eyes checked.  Personal lifestyle choices, including:  Daily care of your teeth and gums.  Regular physical activity.  Eating a healthy diet.  Avoiding tobacco and drug use.  Limiting alcohol use.  Practicing safe sex.  Taking low-dose aspirin every day.  Taking vitamin and mineral supplements as recommended by your health care provider. What happens during an annual well check? The services and screenings done by your health care provider during your annual well  check will depend on your age, overall health, lifestyle risk factors, and family history of disease. Counseling  Your health care provider may ask you questions about your:  Alcohol use.  Tobacco use.  Drug use.  Emotional well-being.  Home and relationship well-being.  Sexual activity.  Eating habits.  History of falls.  Memory and ability to understand (cognition).  Work and work Statistician.  Reproductive health. Screening  You may have the following tests or measurements:  Height, weight, and BMI.  Blood pressure.  Lipid and cholesterol levels. These may be checked every 5 years, or more frequently if you are over 77 years old.  Skin check.  Lung cancer screening. You may have this screening every year starting at age 59 if you have a 30-pack-year history of smoking and currently smoke or have quit within the past 15 years.  Fecal occult blood test (FOBT) of the stool. You may have this test every year starting at age 46.  Flexible sigmoidoscopy or colonoscopy. You may have a sigmoidoscopy every 5 years or a colonoscopy every 10 years starting at age 61.  Hepatitis C blood test.  Hepatitis B blood test.  Sexually transmitted disease (STD) testing.  Diabetes screening. This is done by checking your blood sugar (glucose) after you have not eaten for a while (fasting). You may have this done every 1-3 years.  Bone density scan. This is done to screen for osteoporosis. You may have this done starting at age 64.  Mammogram. This may be done every 1-2 years. Talk to your health care provider about how often you should have regular mammograms. Talk with your health care provider about your test results, treatment options, and if necessary, the need  for more tests. Vaccines  Your health care provider may recommend certain vaccines, such as:  Influenza vaccine. This is recommended every year.  Tetanus, diphtheria, and acellular pertussis (Tdap, Td) vaccine. You  may need a Td booster every 10 years.  Zoster vaccine. You may need this after age 34.  Pneumococcal 13-valent conjugate (PCV13) vaccine. One dose is recommended after age 61.  Pneumococcal polysaccharide (PPSV23) vaccine. One dose is recommended after age 28. Talk to your health care provider about which screenings and vaccines you need and how often you need them. This information is not intended to replace advice given to you by your health care provider. Make sure you discuss any questions you have with your health care provider. Document Released: 01/01/2016 Document Revised: 08/24/2016 Document Reviewed: 10/06/2015 Elsevier Interactive Patient Education  2017 [place] Prevention in the Home Falls can cause injuries. They can happen to people of all ages. There are many things you can do to make your home safe and to help prevent falls. What can I do on the outside of my home?  Regularly fix the edges of walkways and driveways and fix any cracks.  Remove anything that might make you trip as you walk through a door, such as a raised step or threshold.  Trim any bushes or trees on the path to your home.  Use bright outdoor lighting.  Clear any walking paths of anything that might make someone trip, such as rocks or tools.  Regularly check to see if handrails are loose or broken. Make sure that both sides of any steps have handrails.  Any raised decks and porches should have guardrails on the edges.  Have any leaves, snow, or ice cleared regularly.  Use sand or salt on walking paths during winter.  Clean up any spills in your garage right away. This includes oil or grease spills. What can I do in the bathroom?  Use night lights.  Install grab bars by the toilet and in the tub and shower. Do not use towel bars as grab bars.  Use non-skid mats or decals in the tub or shower.  If you need to sit down in the shower, use a plastic, non-slip stool.  Keep the floor  dry. Clean up any water that spills on the floor as soon as it happens.  Remove soap buildup in the tub or shower regularly.  Attach bath mats securely with double-sided non-slip rug tape.  Do not have throw rugs and other things on the floor that can make you trip. What can I do in the bedroom?  Use night lights.  Make sure that you have a light by your bed that is easy to reach.  Do not use any sheets or blankets that are too big for your bed. They should not hang down onto the floor.  Have a firm chair that has side arms. You can use this for support while you get dressed.  Do not have throw rugs and other things on the floor that can make you trip. What can I do in the kitchen?  Clean up any spills right away.  Avoid walking on wet floors.  Keep items that you use a lot in easy-to-reach places.  If you need to reach something above you, use a strong step stool that has a grab bar.  Keep electrical cords out of the way.  Do not use floor polish or wax that makes floors slippery. If you must use wax, use  non-skid floor wax.  Do not have throw rugs and other things on the floor that can make you trip. What can I do with my stairs?  Do not leave any items on the stairs.  Make sure that there are handrails on both sides of the stairs and use them. Fix handrails that are broken or loose. Make sure that handrails are as long as the stairways.  Check any carpeting to make sure that it is firmly attached to the stairs. Fix any carpet that is loose or worn.  Avoid having throw rugs at the top or bottom of the stairs. If you do have throw rugs, attach them to the floor with carpet tape.  Make sure that you have a light switch at the top of the stairs and the bottom of the stairs. If you do not have them, ask someone to add them for you. What else can I do to help prevent falls?  Wear shoes that:  Do not have high heels.  Have rubber bottoms.  Are comfortable and fit you  well.  Are closed at the toe. Do not wear sandals.  If you use a stepladder:  Make sure that it is fully opened. Do not climb a closed stepladder.  Make sure that both sides of the stepladder are locked into place.  Ask someone to hold it for you, if possible.  Clearly mark and make sure that you can see:  Any grab bars or handrails.  First and last steps.  Where the edge of each step is.  Use tools that help you move around (mobility aids) if they are needed. These include:  Canes.  Walkers.  Scooters.  Crutches.  Turn on the lights when you go into a dark area. Replace any light bulbs as soon as they burn out.  Set up your furniture so you have a clear path. Avoid moving your furniture around.  If any of your floors are uneven, fix them.  If there are any pets around you, be aware of where they are.  Review your medicines with your doctor. Some medicines can make you feel dizzy. This can increase your chance of falling. Ask your doctor what other things that you can do to help prevent falls. This information is not intended to replace advice given to you by your health care provider. Make sure you discuss any questions you have with your health care provider. Document Released: 10/01/2009 Document Revised: 05/12/2016 Document Reviewed: 01/09/2015 Elsevier Interactive Patient Education  2017 [employer].

## 2017-07-23 ENCOUNTER — Other Ambulatory Visit: Payer: Self-pay | Admitting: Physician Assistant

## 2017-07-28 ENCOUNTER — Other Ambulatory Visit: Payer: Self-pay | Admitting: Physician Assistant

## 2017-07-28 ENCOUNTER — Ambulatory Visit (INDEPENDENT_AMBULATORY_CARE_PROVIDER_SITE_OTHER): Payer: PPO | Admitting: Physician Assistant

## 2017-07-28 ENCOUNTER — Encounter: Payer: Self-pay | Admitting: Physician Assistant

## 2017-07-28 VITALS — BP 180/100 | HR 87 | Temp 98.2°F | Resp 16 | Ht 63.0 in | Wt 184.0 lb

## 2017-07-28 DIAGNOSIS — I10 Essential (primary) hypertension: Secondary | ICD-10-CM | POA: Diagnosis not present

## 2017-07-28 DIAGNOSIS — E039 Hypothyroidism, unspecified: Secondary | ICD-10-CM | POA: Diagnosis not present

## 2017-07-28 DIAGNOSIS — E1129 Type 2 diabetes mellitus with other diabetic kidney complication: Secondary | ICD-10-CM

## 2017-07-28 DIAGNOSIS — F17200 Nicotine dependence, unspecified, uncomplicated: Secondary | ICD-10-CM

## 2017-07-28 DIAGNOSIS — Z0000 Encounter for general adult medical examination without abnormal findings: Secondary | ICD-10-CM | POA: Diagnosis not present

## 2017-07-28 DIAGNOSIS — C50912 Malignant neoplasm of unspecified site of left female breast: Secondary | ICD-10-CM

## 2017-07-28 DIAGNOSIS — Z170 Estrogen receptor positive status [ER+]: Secondary | ICD-10-CM | POA: Diagnosis not present

## 2017-07-28 DIAGNOSIS — E7800 Pure hypercholesterolemia, unspecified: Secondary | ICD-10-CM | POA: Diagnosis not present

## 2017-07-28 DIAGNOSIS — E876 Hypokalemia: Secondary | ICD-10-CM

## 2017-07-28 MED ORDER — LEVOTHYROXINE SODIUM 88 MCG PO TABS: 88.0000 ug | ORAL_TABLET | Freq: Every day | ORAL | 3 refills | Status: DC

## 2017-07-28 MED ORDER — METOPROLOL SUCCINATE ER 50 MG PO TB24: 50.0000 mg | ORAL_TABLET | Freq: Every day | ORAL | 3 refills | Status: DC

## 2017-07-28 NOTE — Progress Notes (Signed)
Patient: Heather Burke, Female    DOB: April 14, 1951, 66 y.o.   MRN: [id number] Visit Date: 07/28/2017  Today's Provider: [name], PA-C   No chief complaint on file.  Subjective:    Annual wellness visit Heather Burke is a 66 y.o. female. She feels well. She reports exercising none, (patient is very busy and active). She reports she is sleeping fairly well. She is caring for her grandchildren at this time and watches them from 9-11 hours per day. She does report that the oldest is going to start kindergarten in 2 weeks and next week they are going on vacation so she feels this will help her stress levels.  -----------------------------------------------------------   Review of Systems  Constitutional: Negative.   HENT: Positive for congestion and sinus pressure.   Eyes: Negative.   Cardiovascular: Negative.  Negative for chest pain, palpitations and leg swelling.  Gastrointestinal: Negative.   Endocrine: Negative.   Genitourinary: Negative.   Musculoskeletal: Negative.   Skin: Negative.   Allergic/Immunologic: Positive for environmental allergies.  Neurological: Negative.   Hematological: Negative.   Psychiatric/Behavioral: Negative.     Social History   Social History  . Marital status: Widowed    Spouse name: N/A  . Number of children: 2  . Years of education: H/S   Occupational History  . Part-Time    Social History Main Topics  . Smoking status: Current Every Day Smoker    Packs/day: 2.00    Years: 44.00    Types: Cigarettes  . Smokeless tobacco: Never Used  . Alcohol use 12.6 - 16.8 oz/week    21 - 28 Cans of beer per week  . Drug use: No  . Sexual activity: Not on file   Other Topics Concern  . Not on file   Social History Narrative  . No narrative on file    Past Medical History:  Diagnosis Date  . Breast cancer ([place])   . Cancer ([place]) 06/08/2015   T1c, N0;  ER+,PR+, Her 2 neg (FISH neg) x2., SLN x 3 negative. Mammoprint:  Low risk. Multifocal.Invasive mammary and lobular carcinoma.   Marland Kitchen GERD (gastroesophageal reflux disease)   . Heart murmur 2016   newly diagnosed, slight murmur  . Hyperlipidemia   . Hypertension   . Hypothyroidism   . Lupus   . PONV (postoperative nausea and vomiting)    with Hysterectomy  . Thyroid disease      Patient Active Problem List   Diagnosis Date Noted  . Special screening for malignant neoplasms, colon   . Benign neoplasm of sigmoid colon   . Mass of right breast 02/17/2016  . Parotitis, acute 01/05/2016  . Hypothyroidism 09/29/2015  . Hyperlipemia 09/29/2015  . Newly recognized murmur 09/29/2015  . Allergic rhinitis 08/27/2015  . Anxiety 08/27/2015  . Screening breast examination 08/27/2015  . Type 2 diabetes mellitus with other diabetic kidney complication ([place]) 47/08/6282  . Avitaminosis D 08/27/2015  . Compulsive tobacco user syndrome 08/27/2015  . Hypertension 07/01/2015  . Breast cancer, stage 1 ([place]) 06/17/2015    Past Surgical History:  Procedure Laterality Date  . ABDOMINAL HYSTERECTOMY    . BREAST BIOPSY Right 2002   negative/ Dr. Bary Castilla  . BREAST BIOPSY Left 06-08-15   INVASIVE MAMMARY CARCINOMA WITH PARTIAL SOLID PATTERN.   . COLONOSCOPY WITH PROPOFOL N/A 09/13/2016   Procedure: COLONOSCOPY WITH PROPOFOL;  Surgeon: Lucilla Lame, MD;  Location: [facility] ENDOSCOPY;  Service: Endoscopy;  Laterality: N/A;  .  core biopsy Left 06/08/15  . GUM SURGERY  2006  . MASTECTOMY Left   . SENTINEL NODE BIOPSY Left 08/18/2015   Procedure: SENTINEL NODE BIOPSY;  Surgeon: [name], MD;  Location: [facility] ORS;  Service: General;  Laterality: Left;  . SIMPLE MASTECTOMY WITH AXILLARY SENTINEL NODE BIOPSY Left 08/18/2015   Procedure: SIMPLE MASTECTOMY;  Surgeon: [name], MD;  Location: [facility] ORS;  Service: General;  Laterality: Left;  . TONSILLECTOMY      Her family history includes Breast cancer in her cousin, cousin, and other; Cancer in her father; Heart  attack in her sister; Thyroid disease in her daughter.      Current Outpatient Prescriptions:  .  amLODipine (NORVASC) 5 MG tablet, TAKE 1 TABLET BY MOUTH EVERY DAY, Disp: 90 tablet, Rfl: 1 .  aspirin 81 MG tablet, Take 81 mg by mouth daily., Disp: , Rfl:  .  calcium carbonate (OS-CAL) 600 MG TABS tablet, Take 1 tablet (600 mg total) by mouth 2 (two) times daily with a meal., Disp: 60 tablet, Rfl:  .  Cholecalciferol (D3 ADULT PO), Take 1 tablet by mouth daily. 1000 units daily, Disp: , Rfl:  .  FIBER PO, Take by mouth., Disp: , Rfl:  .  fluticasone (FLONASE) 50 MCG/ACT nasal spray, PLACE 2 SPRAYS INTO BOTH NOSTRILS DAILY., Disp: 48 g, Rfl: 1 .  losartan-hydrochlorothiazide (HYZAAR) 100-25 MG tablet, TAKE 1 TABLET BY MOUTH DAILY., Disp: 90 tablet, Rfl: 1 .  lovastatin (MEVACOR) 20 MG tablet, TAKE 1 TABLET (20 MG TOTAL) BY MOUTH AT BEDTIME., Disp: 90 tablet, Rfl: 1 .  metFORMIN (GLUCOPHAGE) 500 MG tablet, TAKE 1 TABLET (500 MG TOTAL) BY MOUTH 2 (TWO) TIMES DAILY WITH A MEAL., Disp: 180 tablet, Rfl: 1 .  metoprolol succinate (TOPROL-XL) 25 MG 24 hr tablet, Take 1 tablet (25 mg total) by mouth daily., Disp: 90 tablet, Rfl: 1 .  Omega-3 Fatty Acids (FISH OIL) 1200 MG CAPS, Take by mouth daily. 1200 mg TID, Disp: , Rfl:  .  potassium chloride (K-DUR,KLOR-CON) 10 MEQ tablet, Take 1 tablet (10 mEq total) by mouth daily., Disp: 90 tablet, Rfl: 1 .  SYNTHROID 88 MCG tablet, TAKE 1 TABLET BY MOUTH EVERY DAY BEFORE BREAKFAST, Disp: 90 tablet, Rfl: 1 .  tamoxifen (NOLVADEX) 20 MG tablet, Take 1 tablet (20 mg total) by mouth daily., Disp: 90 tablet, Rfl: 3 .  Turmeric Curcumin 500 MG CAPS, Take by mouth daily., Disp: , Rfl:   Patient Care Team: [name], PA-C as PCP - General (Family Medicine) Bary Castilla, Forest Gleason, MD (General Surgery) Lorelee Cover., MD as Consulting Physician (Ophthalmology) Lequita Asal, MD as Referring Physician (Hematology and Oncology)     Objective:    Vitals: BP (!) 180/100 (BP Location: Right Arm, Patient Position: Sitting, Cuff Size: Normal)   Pulse 87   Temp 98.2 F (36.8 C) (Oral)   Resp 16   Ht 5\' 3"  (1.6 m)   Wt 184 lb (83.5 kg)   BMI 32.59 kg/m   Physical Exam  Constitutional: She is oriented to person, place, and time. She appears well-developed and well-nourished. No distress.  HENT:  Head: Normocephalic and atraumatic.  Right Ear: Hearing, tympanic membrane, external ear and ear canal normal.  Left Ear: Hearing, tympanic membrane, external ear and ear canal normal.  Nose: Nose normal.  Mouth/Throat: Uvula is midline, oropharynx is clear and moist and mucous membranes are normal. No oropharyngeal exudate.  Eyes: Pupils are equal, round, and  reactive to light. Conjunctivae and EOM are normal. Right eye exhibits no discharge. Left eye exhibits no discharge. No scleral icterus.  Neck: Normal range of motion. Neck supple. No JVD present. Carotid bruit is not present. No tracheal deviation present. No thyromegaly present.  Cardiovascular: Normal rate, regular rhythm, normal heart sounds and intact distal pulses.  Exam reveals no gallop and no friction rub.   No murmur heard. Pulmonary/Chest: Effort normal. No respiratory distress. She has no wheezes. She has rhonchi (cleared with coughing) in the right upper field, the right lower field and the left lower field. She has no rales. She exhibits no tenderness.  Abdominal: Soft. Bowel sounds are normal. She exhibits no distension and no mass. There is no tenderness. There is no rebound and no guarding.  Musculoskeletal: Normal range of motion. She exhibits edema (trace bilateral). She exhibits no tenderness.  Lymphadenopathy:    She has no cervical adenopathy.  Neurological: She is alert and oriented to person, place, and time. She has normal reflexes.  Skin: Skin is warm and dry. No rash noted. She is not diaphoretic.  Psychiatric: She has a normal mood and affect. Her behavior  is normal. Judgment and thought content normal.  Vitals reviewed.  Diabetic Foot Form - Detailed   Diabetic Foot Exam - detailed Diabetic Foot exam was performed with the following findings:  Yes 07/28/2017 11:37 AM  Visual Foot Exam completed.:  Yes  Can the patient see the bottom of their feet?:  Yes Are the shoes appropriate in style and fit?:  Yes Is there swelling or and abnormal foot shape?:  Yes (Comment: trace edema bilateral lower extremities) Is there a claw toe deformity?:  No Is there elevated skin temparature?:  No Is there foot or ankle muscle weakness?:  No Normal Range of Motion:  Yes Pulse Foot Exam completed.:  Yes  Right posterior Tibialias:  Present Left posterior Tibialias:  Present  Right Dorsalis Pedis:  Present Left Dorsalis Pedis:  Present  Semmes-Weinstein Monofilament Test R Site 1-Great Toe:  Pos L Site 1-Great Toe:  Pos       Activities of Daily Living In your present state of health, do you have any difficulty performing the following activities: 07/21/2017 07/21/2017  Hearing? N N  Vision? N N  Difficulty concentrating or making decisions? N N  Walking or climbing stairs? Y Y  Dressing or bathing? N N  Doing errands, shopping? N N  Preparing Food and eating ? N -  Using the Toilet? N -  In the past six months, have you accidently leaked urine? Y -  Comment when sneezing or coughing only -  Do you have problems with loss of bowel control? N -  Managing your Medications? N -  Managing your Finances? N -  Housekeeping or managing your Housekeeping? N -  Some recent data might be hidden    Fall Risk Assessment Fall Risk  07/21/2017 07/21/2017  Falls in the past year? No No     Depression Screen PHQ 2/9 Scores 07/21/2017 07/21/2017  PHQ - 2 Score 0 0   Cognitive Testing - 6-CIT: Patient declined screening.   Assessment & Plan:     Annual Wellness Visit  Reviewed patient's Family Medical History Reviewed and updated list of patient's medical  providers Assessment of cognitive impairment was done Assessed patient's functional ability Established a written schedule for health screening [place] Completed and Reviewed  Exercise Activities and Dietary recommendations Goals    .  Reduce sugar intake           Recommend decreasing junk food as snacks and increasing more healthy snacks.        Immunization History  Administered Date(s) Administered  . Influenza Split 10/21/2010, 10/13/2012  . Influenza,inj,Quad PF,36+ Mos 10/02/2014, 09/09/2015  . Influenza-Unspecified 09/21/2016  . Pneumococcal Conjugate-13 07/21/2017  . Pneumococcal Polysaccharide-23 01/22/2013  . Tdap 10/21/2010  . Zoster 10/03/2013    Health Maintenance  Topic Date Due  . HIV Screening  05/23/1967  . PAP SMEAR  12/21/2014  . FOOT EXAM  12/31/2016  . INFLUENZA VACCINE  07/19/2017  . HEMOGLOBIN A1C  07/27/2017  . OPHTHALMOLOGY EXAM  11/18/2017  . PNA vac Low Risk Adult (2 of 2 - PPSV23) 07/21/2018  . MAMMOGRAM  05/26/2019  . TETANUS/TDAP  10/21/2020  . COLONOSCOPY  09/13/2026  . DEXA SCAN  Completed  . Hepatitis C Screening  Completed     Discussed health benefits of physical activity, and encouraged her to engage in regular exercise appropriate for her age and condition.    1. Medicare annual wellness visit, initial Normal exam. EKG showed NSR rate of 79, no ST changes reviewed personally by me. Prevnar 13 was given to patient on 07/21/17. Due for BMD in January 2019. Mammograms done through Dr. Mike Gip.   - EKG 12-Lead  2. Type 2 diabetes mellitus with other diabetic kidney complication ([place]) Labs ordered as below to be collected at her next lab draw at the cancer center. Diet controlled.  - HgB A1c; Future  3. Essential hypertension Uncontrolled. Increased metoprolol to 50mg . Continue Hyzaar 100-25mg . I will see her back in 6 months (patient request due to difficulty getting appts because of finding a babysitter).    - metoprolol succinate (TOPROL-XL) 50 MG 24 hr tablet; Take 1 tablet (50 mg total) by mouth daily. Take with or immediately following a meal.  Dispense: 90 tablet; Refill: 3  4. Hypothyroidism, unspecified type Stable. Diagnosis pulled for medication refill. Continue current medical treatment plan. Will check labs as below at next blood draw and f/u pending results. - levothyroxine (SYNTHROID, LEVOTHROID) 88 MCG tablet; Take 1 tablet (88 mcg total) by mouth daily.  Dispense: 90 tablet; Refill: 3 - TSH; Future  5. Malignant neoplasm of left breast, stage 1, estrogen receptor positive ([place]) Followed by Oncology, Dr. Mike Gip and general surgery, Dr. Bary Castilla.  6. Pure hypercholesterolemia On pravastatin 20mg . Will check labs as below at next blood draw from cancer center and f/u pending results. - Lipid Profile; Future  7. Compulsive tobacco user syndrome Discussed smoking cessation for 5 minutes. Patient is wanting to try to quit and is willing to restart chantix but wants to wait to start. She will message once she is ready to start.   ------------------------------------------------------------------------------------------------------------    [name], PA-C  [facility]

## 2017-07-28 NOTE — Patient Instructions (Signed)
Health Maintenance for Postmenopausal Women Menopause is a normal process in which your reproductive ability comes to an end. This process happens gradually over a span of months to years, usually between the ages of 22 and 9. Menopause is complete when you have missed 12 consecutive menstrual periods. It is important to talk with your health care provider about some of the most common conditions that affect postmenopausal women, such as heart disease, cancer, and bone loss (osteoporosis). Adopting a healthy lifestyle and getting preventive care can help to promote your health and wellness. Those actions can also lower your chances of developing some of these common conditions. What should I know about menopause? During menopause, you may experience a number of symptoms, such as:  Moderate-to-severe hot flashes.  Night sweats.  Decrease in sex drive.  Mood swings.  Headaches.  Tiredness.  Irritability.  Memory problems.  Insomnia.  Choosing to treat or not to treat menopausal changes is an individual decision that you make with your health care provider. What should I know about hormone replacement therapy and supplements? Hormone therapy products are effective for treating symptoms that are associated with menopause, such as hot flashes and night sweats. Hormone replacement carries certain risks, especially as you become older. If you are thinking about using estrogen or estrogen with progestin treatments, discuss the benefits and risks with your health care provider. What should I know about heart disease and stroke? Heart disease, heart attack, and stroke become more likely as you age. This may be due, in part, to the hormonal changes that your body experiences during menopause. These can affect how your body processes dietary fats, triglycerides, and cholesterol. Heart attack and stroke are both medical emergencies. There are many things that you can do to help prevent heart disease  and stroke:  Have your blood pressure checked at least every 1-2 years. High blood pressure causes heart disease and increases the risk of stroke.  If you are 53-22 years old, ask your health care provider if you should take aspirin to prevent a heart attack or a stroke.  Do not use any tobacco products, including cigarettes, chewing tobacco, or electronic cigarettes. If you need help quitting, ask your health care provider.  It is important to eat a healthy diet and maintain a healthy weight. ? Be sure to include plenty of vegetables, fruits, low-fat dairy products, and lean protein. ? Avoid eating foods that are high in solid fats, added sugars, or salt (sodium).  Get regular exercise. This is one of the most important things that you can do for your health. ? Try to exercise for at least 150 minutes each week. The type of exercise that you do should increase your heart rate and make you sweat. This is known as moderate-intensity exercise. ? Try to do strengthening exercises at least twice each week. Do these in addition to the moderate-intensity exercise.  Know your numbers.Ask your health care provider to check your cholesterol and your blood glucose. Continue to have your blood tested as directed by your health care provider.  What should I know about cancer screening? There are several types of cancer. Take the following steps to reduce your risk and to catch any cancer development as early as possible. Breast Cancer  Practice breast self-awareness. ? This means understanding how your breasts normally appear and feel. ? It also means doing regular breast self-exams. Let your health care provider know about any changes, no matter how small.  If you are 40  or older, have a clinician do a breast exam (clinical breast exam or CBE) every year. Depending on your age, family history, and medical history, it may be recommended that you also have a yearly breast X-ray (mammogram).  If you  have a family history of breast cancer, talk with your health care provider about genetic screening.  If you are at high risk for breast cancer, talk with your health care provider about having an MRI and a mammogram every year.  Breast cancer (BRCA) gene test is recommended for women who have family members with BRCA-related cancers. Results of the assessment will determine the need for genetic counseling and BRCA1 and for BRCA2 testing. BRCA-related cancers include these types: ? Breast. This occurs in males or females. ? Ovarian. ? Tubal. This may also be called fallopian tube cancer. ? Cancer of the abdominal or pelvic lining (peritoneal cancer). ? Prostate. ? Pancreatic.  Cervical, Uterine, and Ovarian Cancer Your health care provider may recommend that you be screened regularly for cancer of the pelvic organs. These include your ovaries, uterus, and vagina. This screening involves a pelvic exam, which includes checking for microscopic changes to the surface of your cervix (Pap test).  For women ages 21-65, health care providers may recommend a pelvic exam and a Pap test every three years. For women ages 79-65, they may recommend the Pap test and pelvic exam, combined with testing for human papilloma virus (HPV), every five years. Some types of HPV increase your risk of cervical cancer. Testing for HPV may also be done on women of any age who have unclear Pap test results.  Other health care providers may not recommend any screening for nonpregnant women who are considered low risk for pelvic cancer and have no symptoms. Ask your health care provider if a screening pelvic exam is right for you.  If you have had past treatment for cervical cancer or a condition that could lead to cancer, you need Pap tests and screening for cancer for at least 20 years after your treatment. If Pap tests have been discontinued for you, your risk factors (such as having a new sexual partner) need to be  reassessed to determine if you should start having screenings again. Some women have medical problems that increase the chance of getting cervical cancer. In these cases, your health care provider may recommend that you have screening and Pap tests more often.  If you have a family history of uterine cancer or ovarian cancer, talk with your health care provider about genetic screening.  If you have vaginal bleeding after reaching menopause, tell your health care provider.  There are currently no reliable tests available to screen for ovarian cancer.  Lung Cancer Lung cancer screening is recommended for adults 69-62 years old who are at high risk for lung cancer because of a history of smoking. A yearly low-dose CT scan of the lungs is recommended if you:  Currently smoke.  Have a history of at least 30 pack-years of smoking and you currently smoke or have quit within the past 15 years. A pack-year is smoking an average of one pack of cigarettes per day for one year.  Yearly screening should:  Continue until it has been 15 years since you quit.  Stop if you develop a health problem that would prevent you from having lung cancer treatment.  Colorectal Cancer  This type of cancer can be detected and can often be prevented.  Routine colorectal cancer screening usually begins at  age 42 and continues through age 45.  If you have risk factors for colon cancer, your health care provider may recommend that you be screened at an earlier age.  If you have a family history of colorectal cancer, talk with your health care provider about genetic screening.  Your health care provider may also recommend using home test kits to check for hidden blood in your stool.  A small camera at the end of a tube can be used to examine your colon directly (sigmoidoscopy or colonoscopy). This is done to check for the earliest forms of colorectal cancer.  Direct examination of the colon should be repeated every  5-10 years until age 71. However, if early forms of precancerous polyps or small growths are found or if you have a family history or genetic risk for colorectal cancer, you may need to be screened more often.  Skin Cancer  Check your skin from head to toe regularly.  Monitor any moles. Be sure to tell your health care provider: ? About any new moles or changes in moles, especially if there is a change in a mole's shape or color. ? If you have a mole that is larger than the size of a pencil eraser.  If any of your family members has a history of skin cancer, especially at a young age, talk with your health care provider about genetic screening.  Always use sunscreen. Apply sunscreen liberally and repeatedly throughout the day.  Whenever you are outside, protect yourself by wearing long sleeves, pants, a wide-brimmed hat, and sunglasses.  What should I know about osteoporosis? Osteoporosis is a condition in which bone destruction happens more quickly than new bone creation. After menopause, you may be at an increased risk for osteoporosis. To help prevent osteoporosis or the bone fractures that can happen because of osteoporosis, the following is recommended:  If you are 46-71 years old, get at least 1,000 mg of calcium and at least 600 mg of vitamin D per day.  If you are older than age 55 but younger than age 65, get at least 1,200 mg of calcium and at least 600 mg of vitamin D per day.  If you are older than age 54, get at least 1,200 mg of calcium and at least 800 mg of vitamin D per day.  Smoking and excessive alcohol intake increase the risk of osteoporosis. Eat foods that are rich in calcium and vitamin D, and do weight-bearing exercises several times each week as directed by your health care provider. What should I know about how menopause affects my mental health? Depression may occur at any age, but it is more common as you become older. Common symptoms of depression  include:  Low or sad mood.  Changes in sleep patterns.  Changes in appetite or eating patterns.  Feeling an overall lack of motivation or enjoyment of activities that you previously enjoyed.  Frequent crying spells.  Talk with your health care provider if you think that you are experiencing depression. What should I know about immunizations? It is important that you get and maintain your immunizations. These include:  Tetanus, diphtheria, and pertussis (Tdap) booster vaccine.  Influenza every year before the flu season begins.  Pneumonia vaccine.  Shingles vaccine.  Your health care provider may also recommend other immunizations. This information is not intended to replace advice given to you by your health care provider. Make sure you discuss any questions you have with your health care provider. Document Released: 01/27/2006  Document Revised: 06/24/2016 Document Reviewed: 09/08/2015 Elsevier Interactive Patient Education  2018 Elsevier Inc.  

## 2017-09-09 ENCOUNTER — Other Ambulatory Visit: Payer: Self-pay | Admitting: Physician Assistant

## 2017-09-09 DIAGNOSIS — I10 Essential (primary) hypertension: Secondary | ICD-10-CM

## 2017-10-03 ENCOUNTER — Other Ambulatory Visit: Payer: Self-pay | Admitting: Physician Assistant

## 2017-10-03 NOTE — Telephone Encounter (Signed)
[name] Heather Burke, RMA  

## 2017-10-03 NOTE — Telephone Encounter (Signed)
We have documented she is on metoprolol 50mg  instead of the 25mg  being requested. Can we confirm dosage? Thanks. JB

## 2017-10-04 ENCOUNTER — Telehealth: Payer: Self-pay

## 2017-10-04 DIAGNOSIS — J069 Acute upper respiratory infection, unspecified: Secondary | ICD-10-CM

## 2017-10-04 MED ORDER — AMOXICILLIN-POT CLAVULANATE 875-125 MG PO TABS: 1.0000 | ORAL_TABLET | Freq: Two times a day (BID) | ORAL | 0 refills | Status: DC

## 2017-10-04 NOTE — Telephone Encounter (Signed)
Patient states she has been dealing with a cough, chest congestion, head congestion, drainage for 6 to 8 weeks. Cough is productive with a milky phlegm. No fever or body aches. She is sleeping ok, cough does not keep her up. She keeps her grandson during the day and thinks that they are passing this back and forth. She has been taking Mucinex. -Kris Mouton, RMA

## 2017-10-04 NOTE — Telephone Encounter (Signed)
Note sent to pharmacy about metoprolol 50mg  now so they will take out the 25mg  dose, and augmentin sent for URI.

## 2017-10-04 NOTE — Telephone Encounter (Signed)
Pt advised-Heather Burke, RMA  

## 2017-10-04 NOTE — Telephone Encounter (Signed)
Patient states she is taking 50 mg tablet-Yamilett Anastos V Darcey Demma, RMA

## 2017-10-10 ENCOUNTER — Telehealth: Payer: Self-pay | Admitting: Physician Assistant

## 2017-10-10 DIAGNOSIS — E1129 Type 2 diabetes mellitus with other diabetic kidney complication: Secondary | ICD-10-CM

## 2017-10-10 DIAGNOSIS — J302 Other seasonal allergic rhinitis: Secondary | ICD-10-CM

## 2017-10-10 DIAGNOSIS — E039 Hypothyroidism, unspecified: Secondary | ICD-10-CM

## 2017-10-10 DIAGNOSIS — I10 Essential (primary) hypertension: Secondary | ICD-10-CM

## 2017-10-10 MED ORDER — METOPROLOL SUCCINATE ER 50 MG PO TB24: 50.0000 mg | ORAL_TABLET | Freq: Every day | ORAL | 3 refills | Status: DC

## 2017-10-10 MED ORDER — FLUTICASONE PROPIONATE 50 MCG/ACT NA SUSP: 2.0000 | Freq: Every day | NASAL | 1 refills | Status: DC

## 2017-10-10 MED ORDER — LEVOTHYROXINE SODIUM 88 MCG PO TABS: 88.0000 ug | ORAL_TABLET | Freq: Every day | ORAL | 3 refills | Status: DC

## 2017-10-10 MED ORDER — METFORMIN HCL 500 MG PO TABS: 500.0000 mg | ORAL_TABLET | Freq: Two times a day (BID) | ORAL | 1 refills | Status: DC

## 2017-10-10 NOTE — Telephone Encounter (Signed)
Pt is requesting the following Rx resent to [facility].  levothyroxine (SYNTHROID, LEVOTHROID) 88 MCG tablet   metoprolol succinate (TOPROL-XL) 50 MG 24 hr tablet  Pt also request a refill for:  metFORMIN (GLUCOPHAGE) 500 MG tablet   fluticasone (FLONASE) 50 MCG/ACT nasal spray  [facility].  CB#[phone]/MW

## 2017-10-15 ENCOUNTER — Other Ambulatory Visit: Payer: Self-pay | Admitting: Physician Assistant

## 2017-10-15 DIAGNOSIS — E039 Hypothyroidism, unspecified: Secondary | ICD-10-CM

## 2017-12-06 ENCOUNTER — Other Ambulatory Visit: Payer: Self-pay | Admitting: Physician Assistant

## 2017-12-06 DIAGNOSIS — E785 Hyperlipidemia, unspecified: Secondary | ICD-10-CM

## 2018-01-08 ENCOUNTER — Other Ambulatory Visit: Payer: PPO

## 2018-01-08 ENCOUNTER — Ambulatory Visit: Payer: PPO | Admitting: Hematology and Oncology

## 2018-01-16 ENCOUNTER — Encounter: Payer: Self-pay | Admitting: Physician Assistant

## 2018-01-16 ENCOUNTER — Encounter: Payer: Self-pay | Admitting: Hematology and Oncology

## 2018-01-16 ENCOUNTER — Inpatient Hospital Stay: Payer: PPO | Admitting: *Deleted

## 2018-01-16 ENCOUNTER — Inpatient Hospital Stay: Payer: PPO | Attending: Hematology and Oncology | Admitting: Hematology and Oncology

## 2018-01-16 VITALS — BP 190/115 | HR 75 | Temp 98.4°F | Resp 20 | Wt 180.1 lb

## 2018-01-16 DIAGNOSIS — Z9012 Acquired absence of left breast and nipple: Secondary | ICD-10-CM | POA: Diagnosis not present

## 2018-01-16 DIAGNOSIS — E785 Hyperlipidemia, unspecified: Secondary | ICD-10-CM | POA: Insufficient documentation

## 2018-01-16 DIAGNOSIS — C50812 Malignant neoplasm of overlapping sites of left female breast: Secondary | ICD-10-CM | POA: Diagnosis not present

## 2018-01-16 DIAGNOSIS — R011 Cardiac murmur, unspecified: Secondary | ICD-10-CM | POA: Diagnosis not present

## 2018-01-16 DIAGNOSIS — N393 Stress incontinence (female) (male): Secondary | ICD-10-CM | POA: Diagnosis not present

## 2018-01-16 DIAGNOSIS — K219 Gastro-esophageal reflux disease without esophagitis: Secondary | ICD-10-CM | POA: Diagnosis not present

## 2018-01-16 DIAGNOSIS — Z170 Estrogen receptor positive status [ER+]: Secondary | ICD-10-CM | POA: Diagnosis not present

## 2018-01-16 DIAGNOSIS — E039 Hypothyroidism, unspecified: Secondary | ICD-10-CM

## 2018-01-16 DIAGNOSIS — Z7984 Long term (current) use of oral hypoglycemic drugs: Secondary | ICD-10-CM | POA: Diagnosis not present

## 2018-01-16 DIAGNOSIS — Z7982 Long term (current) use of aspirin: Secondary | ICD-10-CM | POA: Insufficient documentation

## 2018-01-16 DIAGNOSIS — Z79899 Other long term (current) drug therapy: Secondary | ICD-10-CM | POA: Diagnosis not present

## 2018-01-16 DIAGNOSIS — C50912 Malignant neoplasm of unspecified site of left female breast: Secondary | ICD-10-CM

## 2018-01-16 DIAGNOSIS — Z90710 Acquired absence of both cervix and uterus: Secondary | ICD-10-CM

## 2018-01-16 DIAGNOSIS — Z803 Family history of malignant neoplasm of breast: Secondary | ICD-10-CM

## 2018-01-16 DIAGNOSIS — R232 Flushing: Secondary | ICD-10-CM | POA: Diagnosis not present

## 2018-01-16 DIAGNOSIS — Z79810 Long term (current) use of selective estrogen receptor modulators (SERMs): Secondary | ICD-10-CM | POA: Diagnosis not present

## 2018-01-16 DIAGNOSIS — F17210 Nicotine dependence, cigarettes, uncomplicated: Secondary | ICD-10-CM | POA: Insufficient documentation

## 2018-01-16 DIAGNOSIS — I10 Essential (primary) hypertension: Secondary | ICD-10-CM | POA: Insufficient documentation

## 2018-01-16 DIAGNOSIS — Z853 Personal history of malignant neoplasm of breast: Secondary | ICD-10-CM

## 2018-01-16 LAB — TSH: TSH: 2.935 u[IU]/mL (ref 0.350–4.500)

## 2018-01-16 LAB — COMPREHENSIVE METABOLIC PANEL
ALT: 20 U/L (ref 14–54)
AST: 29 U/L (ref 15–41)
Albumin: 3.8 g/dL (ref 3.5–5.0)
Alkaline Phosphatase: 40 U/L (ref 38–126)
Anion gap: 13 (ref 5–15)
BUN: 12 mg/dL (ref 6–20)
CO2: 25 mmol/L (ref 22–32)
Calcium: 8.9 mg/dL (ref 8.9–10.3)
Chloride: 97 mmol/L — ABNORMAL LOW (ref 101–111)
Creatinine, Ser: 0.61 mg/dL (ref 0.44–1.00)
GFR calc Af Amer: >60 mL/min (ref >60)
GFR calc non Af Amer: >60 mL/min (ref >60)
Glucose, Bld: 120 mg/dL — ABNORMAL HIGH (ref 65–99)
Potassium: 3.4 mmol/L — ABNORMAL LOW (ref 3.5–5.1)
Sodium: 135 mmol/L (ref 135–145)
Total Bilirubin: 0.5 mg/dL (ref 0.3–1.2)
Total Protein: 7.5 g/dL (ref 6.5–8.1)

## 2018-01-16 LAB — CBC WITH DIFFERENTIAL/PLATELET
Basophils Absolute: 0.1 10*3/uL (ref 0–0.1)
Basophils Relative: 1 %
Eosinophils Absolute: 0.2 10*3/uL (ref 0–0.7)
Eosinophils Relative: 2 %
HCT: 41.9 % (ref 35.0–47.0)
Hemoglobin: 14.6 g/dL (ref 12.0–16.0)
Lymphocytes Relative: 29 %
Lymphs Abs: 2.2 10*3/uL (ref 1.0–3.6)
MCH: 33.2 pg (ref 26.0–34.0)
MCHC: 34.9 g/dL (ref 32.0–36.0)
MCV: 95 fL (ref 80.0–100.0)
Monocytes Absolute: 0.6 10*3/uL (ref 0.2–0.9)
Monocytes Relative: 8 %
Neutro Abs: 4.5 10*3/uL (ref 1.4–6.5)
Neutrophils Relative %: 60 %
Platelets: 217 10*3/uL (ref 150–440)
RBC: 4.41 MIL/uL (ref 3.80–5.20)
RDW: 13.6 % (ref 11.5–14.5)
WBC: 7.4 10*3/uL (ref 3.6–11.0)

## 2018-01-16 LAB — LIPID PANEL
CHOL/HDL RATIO: 4.3 ratio
Cholesterol: 169 mg/dL (ref 0–200)
HDL: 39 mg/dL — ABNORMAL LOW (ref >40)
LDL CALC: 66 mg/dL (ref 0–99)
Triglycerides: 321 mg/dL — ABNORMAL HIGH (ref <150)
VLDL: 64 mg/dL — AB (ref 0–40)

## 2018-01-16 LAB — HEMOGLOBIN A1C
HEMOGLOBIN A1C: 6.5 % — AB (ref 4.8–5.6)
MEAN PLASMA GLUCOSE: 139.85 mg/dL

## 2018-01-16 NOTE — Progress Notes (Signed)
[facility] day:  01/16/2018  Chief Complaint: Heather Burke is a 67 y.o. female with stage IA multi-focal left breast cancer who is seen for 6 month ssessment.  HPI:  The patient was last seen in the medical oncology clinic on 07/07/2017.  At that time, she was doing well.  Exam was stable.  CBC, LFTs, and CA27.29 was normal.  Potassium was 3.4.  She continued tamoxifen.  During the interm, patient is doing "ok". Patient is complaining of stress urinary incontinence today. She is inquiring about something that she heard regarding stopping tamoxifen for 6 months to help with incontinence. Patient verbalizes no breast concerns today. She has not experienced any B symptoms or interval infections.  Patient presents HYPERtensive to the clinic today. Her blood pressure is 190/115.  Patient is eating well. Her weight has decreased 4 pounds since her last visit. She denies pain in the clinic today.    Past Medical History:  Diagnosis Date  . Breast cancer ([place])   . Cancer ([place]) 06/08/2015   T1c, N0;  ER+,PR+, Her 2 neg (FISH neg) x2., SLN x 3 negative. Mammoprint: Low risk. Multifocal.Invasive mammary and lobular carcinoma.   Marland Kitchen GERD (gastroesophageal reflux disease)   . Heart murmur 2016   newly diagnosed, slight murmur  . Hyperlipidemia   . Hypertension   . Hypothyroidism   . Lupus   . PONV (postoperative nausea and vomiting)    with Hysterectomy  . Thyroid disease     Past Surgical History:  Procedure Laterality Date  . ABDOMINAL HYSTERECTOMY    . BREAST BIOPSY Right 2002   negative/ Dr. Bary Castilla  . BREAST BIOPSY Left 06-08-15   INVASIVE MAMMARY CARCINOMA WITH PARTIAL SOLID PATTERN.   . COLONOSCOPY WITH PROPOFOL N/A 09/13/2016   Procedure: COLONOSCOPY WITH PROPOFOL;  Surgeon: Lucilla Lame, MD;  Location: [facility] ENDOSCOPY;  Service: Endoscopy;  Laterality: N/A;  . core biopsy Left 06/08/15  . GUM SURGERY  2006  . MASTECTOMY Left   .  SENTINEL NODE BIOPSY Left 08/18/2015   Procedure: SENTINEL NODE BIOPSY;  Surgeon: [name], MD;  Location: [facility] ORS;  Service: General;  Laterality: Left;  . SIMPLE MASTECTOMY WITH AXILLARY SENTINEL NODE BIOPSY Left 08/18/2015   Procedure: SIMPLE MASTECTOMY;  Surgeon: [name], MD;  Location: [facility] ORS;  Service: General;  Laterality: Left;  . TONSILLECTOMY      Family History  Problem Relation Age of Onset  . Cancer Father        prostate  . Thyroid disease Daughter   . Heart attack Sister   . Breast cancer Cousin   . Breast cancer Cousin   . Breast cancer Other     Social History:  reports that she has been smoking cigarettes.  She has a 88.00 pack-year smoking history. she has never used smokeless tobacco. She reports that she drinks about 12.6 - 16.8 oz of alcohol per week. She reports that she does not use drugs.  She has smoked 2 packs per day since age 4.  She plans on taking Chantix.  Her niece is J.  Weaver.  She lives in [place].  The patient is alone today.  Allergies:  Allergies  Allergen Reactions  . Amlodipine Nausea Only and Other (See Comments)    Stomach cramping  . Atorvastatin Nausea And Vomiting  . Lisinopril Cough  . Pravastatin Nausea And Vomiting    Current Medications: Current Outpatient Medications  Medication Sig  Dispense Refill  . aspirin 81 MG tablet Take 81 mg by mouth daily.    . calcium carbonate (OS-CAL) 600 MG TABS tablet Take 1 tablet (600 mg total) by mouth 2 (two) times daily with a meal. 60 tablet   . Cholecalciferol (D3 ADULT PO) Take 1 tablet by mouth daily. 1000 units daily    . FIBER PO Take by mouth.    . fluticasone (FLONASE) 50 MCG/ACT nasal spray Place 2 sprays into both nostrils daily. 48 g 1  . KLOR-CON M10 10 MEQ tablet TAKE 1 TABLET BY MOUTH EVERY DAY 90 tablet 1  . losartan-hydrochlorothiazide (HYZAAR) 100-25 MG tablet TAKE 1 TABLET BY MOUTH EVERY DAY 90 tablet 1  . lovastatin (MEVACOR) 20 MG tablet TAKE 1  TABLET (20 MG TOTAL) BY MOUTH AT BEDTIME. 90 tablet 1  . metFORMIN (GLUCOPHAGE) 500 MG tablet Take 1 tablet (500 mg total) by mouth 2 (two) times daily with a meal. 180 tablet 1  . metoprolol succinate (TOPROL-XL) 50 MG 24 hr tablet Take 1 tablet (50 mg total) by mouth daily. Take with or immediately following a meal. 90 tablet 3  . Omega-3 Fatty Acids (FISH OIL) 1200 MG CAPS Take by mouth daily. 1200 mg TID    . SYNTHROID 88 MCG tablet TAKE 1 TABLET BY MOUTH EVERY DAY BEFORE BREAKFAST 90 tablet 1  . tamoxifen (NOLVADEX) 20 MG tablet Take 1 tablet (20 mg total) by mouth daily. 90 tablet 3   No current facility-administered medications for this visit.     Review of Systems:  GENERAL:  Feels "ok".  No fevers or sweats.  Weight down 4 pounds. PERFORMANCE STATUS (ECOG): 0 HEENT:  No visual changes, runny nose, sore throat, mouth sores or tenderness. Lungs: No shortness of breath or cough.  No hemoptysis. Cardiac:  No chest pain, palpitations, orthopnea, or PND.  Blood pressure varies. GI:  No nausea, vomiting, diarrhea, constipation, melena or hematochezia. GU:  Stress incontinence. No urgency, frequency, dysuria, or hematuria. Musculoskeletal:  No back pain.  No joint pain.  No muscle tenderness. Extremities:  No pain or swelling. Skin:  No rashes or skin changes. Neuro:  No headache, numbness or weakness, balance or coordination issues. Endocrine:  Diabetes.  Thyroid disease on Synthroid. Hot  flashes.   No night sweats. Psych:  No mood changes, depression or anxiety. Pain:  No focal pain. Review of systems:  All other systems reviewed and found to be negative.  Physical Exam: Blood pressure (!) 190/115, pulse 75, temperature 98.4 F (36.9 C), temperature source Tympanic, resp. rate 20, weight 180 lb 2 oz (81.7 kg). GENERAL:  Well developed, well nourished, woman sitting comfortably in the exam room in no acute distress. MENTAL STATUS:  Alert and oriented to person, place and  time. HEAD:  Shoulder length brown hair with highlights.  Normocephalic, atraumatic, face symmetric, no Cushingoid features. EYES:  Blue eyes.  Pupils equal round and reactive to light and accomodation.  No conjunctivitis or scleral icterus. ENT:  Oropharynx clear without lesion.  Tongue normal. Mucous membranes moist.  RESPIRATORY:  Clear to auscultation without rales, wheezes or rhonchi. CARDIOVASCULAR:  Regular rate and rhythm without murmur, rub or gallop. BREAST:  Right breast s/p cyst removal medially with slight scarring.  Fingertip cystic area at 10 o'clock.  Inferior fibrocystic changes.  No masses, skin changes or nipple discharge. s/p left mastectomy.  Two small SQ cysts.  No erythema or nodularity.  ABDOMEN:  Soft, non-tender, with active bowel sounds,  and no hepatosplenomegaly.  No masses. SKIN:  No rashes, ulcers or lesions. EXTREMITIES: No edema, no skin discoloration or tenderness.  No palpable cords. LYMPH NODES: No palpable cervical, supraclavicular, axillary or inguinal adenopathy  NEUROLOGICAL: Unremarkable. PSYCH:  Appropriate.   Clinical Support on 01/16/2018  Component Date Value Ref Range Status  . Sodium 01/16/2018 135  135 - 145 mmol/L Final  . Potassium 01/16/2018 3.4* 3.5 - 5.1 mmol/L Final  . Chloride 01/16/2018 97* 101 - 111 mmol/L Final  . CO2 01/16/2018 25  22 - 32 mmol/L Final  . Glucose, Bld 01/16/2018 120* 65 - 99 mg/dL Final  . BUN 01/16/2018 12  6 - 20 mg/dL Final  . Creatinine, Ser 01/16/2018 0.61  0.44 - 1.00 mg/dL Final  . Calcium 01/16/2018 8.9  8.9 - 10.3 mg/dL Final  . Total Protein 01/16/2018 7.5  6.5 - 8.1 g/dL Final  . Albumin 01/16/2018 3.8  3.5 - 5.0 g/dL Final  . AST 01/16/2018 29  15 - 41 U/L Final  . ALT 01/16/2018 20  14 - 54 U/L Final  . Alkaline Phosphatase 01/16/2018 40  38 - 126 U/L Final  . Total Bilirubin 01/16/2018 0.5  0.3 - 1.2 mg/dL Final  . GFR calc non Af Amer 01/16/2018 >60  >60 mL/min Final  . GFR calc Af Amer  01/16/2018 >60  >60 mL/min Final   Comment: (NOTE) The eGFR has been calculated using the CKD EPI equation. This calculation has not been validated in all clinical situations. eGFR's persistently <60 mL/min signify possible Chronic Kidney Disease.   Georgiann Hahn gap 01/16/2018 13  5 - 15 Final   Performed at [facility], [address] [place], [place] [address]  . WBC 01/16/2018 7.4  3.6 - 11.0 K/uL Final  . RBC 01/16/2018 4.41  3.80 - 5.20 MIL/uL Final  . Hemoglobin 01/16/2018 14.6  12.0 - 16.0 g/dL Final  . HCT 01/16/2018 41.9  35.0 - 47.0 % Final  . MCV 01/16/2018 95.0  80.0 - 100.0 fL Final  . MCH 01/16/2018 33.2  26.0 - 34.0 pg Final  . MCHC 01/16/2018 34.9  32.0 - 36.0 g/dL Final  . RDW 01/16/2018 13.6  11.5 - 14.5 % Final  . Platelets 01/16/2018 217  150 - 440 K/uL Final  . Neutrophils Relative % 01/16/2018 60  % Final  . Neutro Abs 01/16/2018 4.5  1.4 - 6.5 K/uL Final  . Lymphocytes Relative 01/16/2018 29  % Final  . Lymphs Abs 01/16/2018 2.2  1.0 - 3.6 K/uL Final  . Monocytes Relative 01/16/2018 8  % Final  . Monocytes Absolute 01/16/2018 0.6  0.2 - 0.9 K/uL Final  . Eosinophils Relative 01/16/2018 2  % Final  . Eosinophils Absolute 01/16/2018 0.2  0 - 0.7 K/uL Final  . Basophils Relative 01/16/2018 1  % Final  . Basophils Absolute 01/16/2018 0.1  0 - 0.1 K/uL Final   Performed at [facility], [address] [place], [place] [address]    Assessment:  YAILYN STRACK is a 67 y.o. female with stage IA multi-focal left breast cancer s/p left mastectomy on 08/18/2015.  Pathology revealed three separate foci of lobular cancer (18 mm, 9 mm, 0.9 mm) and one focus of invasive mammary carcinoma (5 mm).  One lymph node was negative for malignancy.  ER and PR receptors were positive.  HER-2/neu was negative.  Pathologic stage was T1cN0M0.  MammaPrint on 08/28/2015 revealed low risk luminal-type (A).   She was  on letrozole (08/2015) then Aromasin (02/2016).  She  developed numbness in upper extremity (noticed more in right hand). Tamoxifen was started in 03/2016 or 04/2016.  She notes that her hands still hurt when taking a child proof cap off.  She has hot flashes.  Right sided mammogram on on 05/25/2017 revealed no evidence of malignancy. She has a colonoscopy planned on 09/20/2016.  CA27.29 is followed:  30.2 on 08/25/2016, 29.3 on 12/22/2016, 27.2 on 07/07/2017, and 26.3 on 01/16/2018.  Bone density on 01/07/2016 was normal with a T-score of -0.2 in the right femoral neck.  L-4 was excluded due to degenerative disease.  She is on calcium and vitamin D.  Symptomatically, she has stress urinary incontinence. Exam is stable.  Potassium 3.4.   Plan: 1.  Labs today:  CBC with diff, CMP, CA27.29. 2.  Schedule right mammogram on 05/25/2018. 3.  Continue tamoxifen as previously prescribed. 4.  Discussed BCI testing to assess benefit of extended adjuvant hormonal therapy at the 4.5 year mark.  5.  Discussed stress incontinence. Offered referral to urology, however patient declined.  6.  Discussed elevated blood pressure. Patient notes that her pressures have been fine at home. Discussed ED follow up, however she refused. Will communicate with PCP.  7.  RTC in 6 months for MD assessment and labs (CBC with diff, CMP, CA27.29).   Honor Loh, NP  01/16/2018, 8:53 AM   I saw and evaluated the patient, participating in the key portions of the service and reviewing pertinent diagnostic studies and records.  I reviewed the nurse practitioner's note and agree with the findings and the plan.  The assessment and plan were discussed with the patient.  Several questions were asked by the patient and answered.   Nolon Stalls, MD 01/16/2018,8:53 AM

## 2018-01-16 NOTE — Progress Notes (Signed)
Patient states she is having stress incontinence.  States she read it is a side effect of tamoxifen.  When patients come off the drug for about 6 weeks the issue resolves.   She states she also very emotional - cries easily.  Patient's BP elevated.  190/115 HR 75  Rechecked 193/103 HR 75 - Per Dr. Mike Gip, advised patient to contact PCP. I wrote the numbers down for her to be able to report what it was while in clinic today.  Patient states she took her BP meds this morning.

## 2018-01-17 ENCOUNTER — Other Ambulatory Visit: Payer: Self-pay | Admitting: Hematology and Oncology

## 2018-01-17 DIAGNOSIS — C50912 Malignant neoplasm of unspecified site of left female breast: Secondary | ICD-10-CM

## 2018-01-17 DIAGNOSIS — Z170 Estrogen receptor positive status [ER+]: Principal | ICD-10-CM

## 2018-01-17 LAB — CA 27.29 (SERIAL MONITOR): CA 27.29: 26.3 U/mL (ref 0.0–38.6)

## 2018-01-20 ENCOUNTER — Other Ambulatory Visit: Payer: Self-pay | Admitting: Physician Assistant

## 2018-01-20 DIAGNOSIS — E876 Hypokalemia: Secondary | ICD-10-CM

## 2018-01-29 ENCOUNTER — Encounter: Payer: Self-pay | Admitting: Physician Assistant

## 2018-01-29 ENCOUNTER — Ambulatory Visit (INDEPENDENT_AMBULATORY_CARE_PROVIDER_SITE_OTHER): Payer: PPO | Admitting: Physician Assistant

## 2018-01-29 VITALS — BP 190/100 | HR 66 | Temp 98.2°F | Resp 16 | Ht 63.0 in | Wt 181.6 lb

## 2018-01-29 DIAGNOSIS — Z6832 Body mass index (BMI) 32.0-32.9, adult: Secondary | ICD-10-CM

## 2018-01-29 DIAGNOSIS — I10 Essential (primary) hypertension: Secondary | ICD-10-CM | POA: Diagnosis not present

## 2018-01-29 MED ORDER — METOPROLOL TARTRATE 100 MG PO TABS: 100.0000 mg | ORAL_TABLET | Freq: Two times a day (BID) | ORAL | 1 refills | Status: DC

## 2018-01-29 NOTE — Patient Instructions (Signed)
DASH Eating Plan DASH stands for "Dietary Approaches to Stop Hypertension." The DASH eating plan is a healthy eating plan that has been shown to reduce high blood pressure (hypertension). It may also reduce your risk for type 2 diabetes, heart disease, and stroke. The DASH eating plan may also help with weight loss. What are tips for following this plan? General guidelines  Avoid eating more than 2,300 mg (milligrams) of salt (sodium) a day. If you have hypertension, you may need to reduce your sodium intake to 1,500 mg a day.  Limit alcohol intake to no more than 1 drink a day for nonpregnant women and 2 drinks a day for men. One drink equals 12 oz of beer, 5 oz of wine, or 1 oz of hard liquor.  Work with your health care provider to maintain a healthy body weight or to lose weight. Ask what an ideal weight is for you.  Get at least 30 minutes of exercise that causes your heart to beat faster (aerobic exercise) most days of the week. Activities may include walking, swimming, or biking.  Work with your health care provider or diet and nutrition specialist (dietitian) to adjust your eating plan to your individual calorie needs. Reading food labels  Check food labels for the amount of sodium per serving. Choose foods with less than 5 percent of the Daily Value of sodium. Generally, foods with less than 300 mg of sodium per serving fit into this eating plan.  To find whole grains, look for the word "whole" as the first word in the ingredient list. Shopping  Buy products labeled as "low-sodium" or "no salt added."  Buy fresh foods. Avoid canned foods and premade or frozen meals. Cooking  Avoid adding salt when cooking. Use salt-free seasonings or herbs instead of table salt or sea salt. Check with your health care provider or pharmacist before using salt substitutes.  Do not fry foods. Cook foods using healthy methods such as baking, boiling, grilling, and broiling instead.  Cook with  heart-healthy oils, such as olive, canola, soybean, or sunflower oil. Meal planning   Eat a balanced diet that includes: ? 5 or more servings of fruits and vegetables each day. At each meal, try to fill half of your plate with fruits and vegetables. ? Up to 6-8 servings of whole grains each day. ? Less than 6 oz of lean meat, poultry, or fish each day. A 3-oz serving of meat is about the same size as a deck of cards. One egg equals 1 oz. ? 2 servings of low-fat dairy each day. ? A serving of nuts, seeds, or beans 5 times each week. ? Heart-healthy fats. Healthy fats called Omega-3 fatty acids are found in foods such as flaxseeds and coldwater fish, like sardines, salmon, and mackerel.  Limit how much you eat of the following: ? Canned or prepackaged foods. ? Food that is high in trans fat, such as fried foods. ? Food that is high in saturated fat, such as fatty meat. ? Sweets, desserts, sugary drinks, and other foods with added sugar. ? Full-fat dairy products.  Do not salt foods before eating.  Try to eat at least 2 vegetarian meals each week.  Eat more home-cooked food and less restaurant, buffet, and fast food.  When eating at a restaurant, ask that your food be prepared with less salt or no salt, if possible. What foods are recommended? The items listed may not be a complete list. Talk with your dietitian about what   dietary choices are best for you. Grains Whole-grain or whole-wheat bread. Whole-grain or whole-wheat pasta. Brown rice. Oatmeal. Quinoa. Bulgur. Whole-grain and low-sodium cereals. Pita bread. Low-fat, low-sodium crackers. Whole-wheat flour tortillas. Vegetables Fresh or frozen vegetables (raw, steamed, roasted, or grilled). Low-sodium or reduced-sodium tomato and vegetable juice. Low-sodium or reduced-sodium tomato sauce and tomato paste. Low-sodium or reduced-sodium canned vegetables. Fruits All fresh, dried, or frozen fruit. Canned fruit in natural juice (without  added sugar). Meat and other protein foods Skinless chicken or turkey. Ground chicken or turkey. Pork with fat trimmed off. Fish and seafood. Egg whites. Dried beans, peas, or lentils. Unsalted nuts, nut butters, and seeds. Unsalted canned beans. Lean cuts of beef with fat trimmed off. Low-sodium, lean deli meat. Dairy Low-fat (1%) or fat-free (skim) milk. Fat-free, low-fat, or reduced-fat cheeses. Nonfat, low-sodium ricotta or cottage cheese. Low-fat or nonfat yogurt. Low-fat, low-sodium cheese. Fats and oils Soft margarine without trans fats. Vegetable oil. Low-fat, reduced-fat, or light mayonnaise and salad dressings (reduced-sodium). Canola, safflower, olive, soybean, and sunflower oils. Avocado. Seasoning and other foods Herbs. Spices. Seasoning mixes without salt. Unsalted popcorn and pretzels. Fat-free sweets. What foods are not recommended? The items listed may not be a complete list. Talk with your dietitian about what dietary choices are best for you. Grains Baked goods made with fat, such as croissants, muffins, or some breads. Dry pasta or rice meal packs. Vegetables Creamed or fried vegetables. Vegetables in a cheese sauce. Regular canned vegetables (not low-sodium or reduced-sodium). Regular canned tomato sauce and paste (not low-sodium or reduced-sodium). Regular tomato and vegetable juice (not low-sodium or reduced-sodium). Pickles. Olives. Fruits Canned fruit in a light or heavy syrup. Fried fruit. Fruit in cream or butter sauce. Meat and other protein foods Fatty cuts of meat. Ribs. Fried meat. Bacon. Sausage. Bologna and other processed lunch meats. Salami. Fatback. Hotdogs. Bratwurst. Salted nuts and seeds. Canned beans with added salt. Canned or smoked fish. Whole eggs or egg yolks. Chicken or turkey with skin. Dairy Whole or 2% milk, cream, and half-and-half. Whole or full-fat cream cheese. Whole-fat or sweetened yogurt. Full-fat cheese. Nondairy creamers. Whipped toppings.  Processed cheese and cheese spreads. Fats and oils Butter. Stick margarine. Lard. Shortening. Ghee. Bacon fat. Tropical oils, such as coconut, palm kernel, or palm oil. Seasoning and other foods Salted popcorn and pretzels. Onion salt, garlic salt, seasoned salt, table salt, and sea salt. Worcestershire sauce. Tartar sauce. Barbecue sauce. Teriyaki sauce. Soy sauce, including reduced-sodium. Steak sauce. Canned and packaged gravies. Fish sauce. Oyster sauce. Cocktail sauce. Horseradish that you find on the shelf. Ketchup. Mustard. Meat flavorings and tenderizers. Bouillon cubes. Hot sauce and Tabasco sauce. Premade or packaged marinades. Premade or packaged taco seasonings. Relishes. Regular salad dressings. Where to find more information:  National Heart, Lung, and Blood Institute: www.nhlbi.nih.gov  American Heart Association: www.heart.org Summary  The DASH eating plan is a healthy eating plan that has been shown to reduce high blood pressure (hypertension). It may also reduce your risk for type 2 diabetes, heart disease, and stroke.  With the DASH eating plan, you should limit salt (sodium) intake to 2,300 mg a day. If you have hypertension, you may need to reduce your sodium intake to 1,500 mg a day.  When on the DASH eating plan, aim to eat more fresh fruits and vegetables, whole grains, lean proteins, low-fat dairy, and heart-healthy fats.  Work with your health care provider or diet and nutrition specialist (dietitian) to adjust your eating plan to your individual   calorie needs. This information is not intended to replace advice given to you by your health care provider. Make sure you discuss any questions you have with your health care provider. Document Released: 11/24/2011 Document Revised: 11/28/2016 Document Reviewed: 11/28/2016 Elsevier Interactive Patient Education  2018 Elsevier Inc.  

## 2018-01-29 NOTE — Progress Notes (Signed)
Patient: Heather Burke Female    DOB: [birth year]   67 y.o.   MRN: [id number] Visit Date: 01/29/2018  Today's Provider: [name], PA-C   Chief Complaint  Patient presents with  . Hypertension   Subjective:    HPI Patient here today to follow up on elevated blood pressure at cancer center. Patient reports BP was  190/115 on 01/16/18. Patient reports she does not check BP at home. Patient denies any headache, visual changes, nausea, vomiting or chest pain. Patient reports she does not follow a low salt diet. Patient denies any swelling around foot or ankles. Patient reports being active with daily activities. Patient reports good tolerance and compliance with medication. Patient is taking Metoprolol succinate 50 mg BID.  Patient reports that she is tired and anxious all the time and reports that Tamoxifen may be causing symptoms.     Allergies  Allergen Reactions  . Amlodipine Nausea Only and Other (See Comments)    Stomach cramping  . Atorvastatin Nausea And Vomiting  . Lisinopril Cough  . Pravastatin Nausea And Vomiting     Current Outpatient Medications:  .  aspirin 81 MG tablet, Take 81 mg by mouth daily., Disp: , Rfl:  .  calcium carbonate (OS-CAL) 600 MG TABS tablet, Take 1 tablet (600 mg total) by mouth 2 (two) times daily with a meal., Disp: 60 tablet, Rfl:  .  Cholecalciferol (D3 ADULT PO), Take 1 tablet by mouth daily. 1000 units daily, Disp: , Rfl:  .  FIBER PO, Take by mouth., Disp: , Rfl:  .  fluticasone (FLONASE) 50 MCG/ACT nasal spray, Place 2 sprays into both nostrils daily., Disp: 48 g, Rfl: 1 .  KLOR-CON M10 10 MEQ tablet, TAKE 1 TABLET BY MOUTH EVERY DAY, Disp: 90 tablet, Rfl: 1 .  losartan-hydrochlorothiazide (HYZAAR) 100-25 MG tablet, TAKE 1 TABLET BY MOUTH EVERY DAY, Disp: 90 tablet, Rfl: 1 .  lovastatin (MEVACOR) 20 MG tablet, TAKE 1 TABLET (20 MG TOTAL) BY MOUTH AT BEDTIME., Disp: 90 tablet, Rfl: 1 .  metFORMIN (GLUCOPHAGE) 500 MG tablet,  Take 1 tablet (500 mg total) by mouth 2 (two) times daily with a meal., Disp: 180 tablet, Rfl: 1 .  metoprolol succinate (TOPROL-XL) 50 MG 24 hr tablet, Take 1 tablet (50 mg total) by mouth daily. Take with or immediately following a meal. (Patient taking differently: Take 50 mg by mouth 2 (two) times daily. Take with or immediately following a meal.), Disp: 90 tablet, Rfl: 3 .  Omega-3 Fatty Acids (FISH OIL) 1200 MG CAPS, Take by mouth daily. 1200 mg TID, Disp: , Rfl:  .  SYNTHROID 88 MCG tablet, TAKE 1 TABLET BY MOUTH EVERY DAY BEFORE BREAKFAST, Disp: 90 tablet, Rfl: 1 .  tamoxifen (NOLVADEX) 20 MG tablet, TAKE 1 TABLET (20 MG TOTAL) BY MOUTH DAILY., Disp: 90 tablet, Rfl: 3  Review of Systems  Constitutional: Positive for fatigue.  HENT: Negative.   Respiratory: Negative.   Cardiovascular: Negative.   Gastrointestinal: Negative.   Neurological: Negative.   Psychiatric/Behavioral: Positive for dysphoric mood. The patient is nervous/anxious.        Mood lability (fine, then crying, then fine, then angry)    Social History   Tobacco Use  . Smoking status: Current Every Day Smoker    Packs/day: 2.00    Years: 44.00    Pack years: 88.00    Types: Cigarettes  . Smokeless tobacco: Never Used  Substance Use Topics  . Alcohol  use: Yes    Alcohol/week: 12.6 - 16.8 oz    Types: 21 - 28 Cans of beer per week   Objective:   BP (!) 190/100 (BP Location: Right Arm, Patient Position: Sitting, Cuff Size: Large)   Pulse 66   Temp 98.2 F (36.8 C) (Oral)   Resp 16   Ht 5\' 3"  (1.6 m)   Wt 181 lb 9.6 oz (82.4 kg)   SpO2 98%   BMI 32.17 kg/m  Vitals:   01/29/18 1130  BP: (!) 190/100  Pulse: 66  Resp: 16  Temp: 98.2 F (36.8 C)  TempSrc: Oral  SpO2: 98%  Weight: 181 lb 9.6 oz (82.4 kg)  Height: 5\' 3"  (1.6 m)     Physical Exam  Constitutional: She appears well-developed and well-nourished. No distress.  Neck: Normal range of motion. Neck supple. No JVD present. No tracheal  deviation present. No thyromegaly present.  Cardiovascular: Normal rate, regular rhythm and normal heart sounds. Exam reveals no gallop and no friction rub.  No murmur heard. Pulmonary/Chest: Effort normal and breath sounds normal. No respiratory distress. She has no wheezes. She has no rales.  Musculoskeletal: She exhibits no edema.  Lymphadenopathy:    She has no cervical adenopathy.  Skin: She is not diaphoretic.  Psychiatric: Her mood appears anxious. She exhibits a depressed mood. She expresses no homicidal and no suicidal ideation.  Vitals reviewed.       Assessment & Plan:     1. Essential hypertension BP still elevated today. Will increase metoprolol to 100mg  BID as below. Continue losartan-HCTZ as well. She is to send message in one week with home BP reading. If still elevated will send in diltiazem or verapamil to see if she may tolerate this better than amlodipine (caused stomach cramping).  - metoprolol tartrate (LOPRESSOR) 100 MG tablet; Take 1 tablet (100 mg total) by mouth 2 (two) times daily.  Dispense: 60 tablet; Refill: 1  2. BMI 32.0-32.9,adult Counseled patient on healthy lifestyle modifications including dieting and exercise.        [name], PA-C  [facility]

## 2018-02-06 ENCOUNTER — Encounter: Payer: Self-pay | Admitting: Physician Assistant

## 2018-02-19 ENCOUNTER — Encounter: Payer: Self-pay | Admitting: Physician Assistant

## 2018-02-25 ENCOUNTER — Encounter: Payer: Self-pay | Admitting: Physician Assistant

## 2018-02-25 DIAGNOSIS — I10 Essential (primary) hypertension: Secondary | ICD-10-CM

## 2018-02-26 MED ORDER — METOPROLOL TARTRATE 100 MG PO TABS: 100.0000 mg | ORAL_TABLET | Freq: Two times a day (BID) | ORAL | 1 refills | Status: DC

## 2018-03-04 ENCOUNTER — Encounter: Payer: Self-pay | Admitting: Physician Assistant

## 2018-03-04 DIAGNOSIS — I10 Essential (primary) hypertension: Secondary | ICD-10-CM

## 2018-03-06 MED ORDER — METOPROLOL TARTRATE 100 MG PO TABS: 100.0000 mg | ORAL_TABLET | Freq: Two times a day (BID) | ORAL | 1 refills | Status: DC

## 2018-05-29 ENCOUNTER — Other Ambulatory Visit: Payer: Self-pay | Admitting: Urgent Care

## 2018-05-29 ENCOUNTER — Ambulatory Visit
Admission: RE | Admit: 2018-05-29 | Discharge: 2018-05-29 | Disposition: A | Discharge status: Home / Self Care | Payer: PPO | Source: Ambulatory Visit | Attending: Urgent Care | Admitting: Urgent Care

## 2018-05-29 DIAGNOSIS — Z1231 Encounter for screening mammogram for malignant neoplasm of breast: Secondary | ICD-10-CM | POA: Insufficient documentation

## 2018-05-29 DIAGNOSIS — R928 Other abnormal and inconclusive findings on diagnostic imaging of breast: Secondary | ICD-10-CM

## 2018-05-29 DIAGNOSIS — Z853 Personal history of malignant neoplasm of breast: Secondary | ICD-10-CM | POA: Insufficient documentation

## 2018-05-29 DIAGNOSIS — R921 Mammographic calcification found on diagnostic imaging of breast: Secondary | ICD-10-CM

## 2018-05-29 DIAGNOSIS — C50912 Malignant neoplasm of unspecified site of left female breast: Secondary | ICD-10-CM

## 2018-05-29 DIAGNOSIS — Z170 Estrogen receptor positive status [ER+]: Secondary | ICD-10-CM

## 2018-06-05 ENCOUNTER — Other Ambulatory Visit: Payer: Self-pay | Admitting: Physician Assistant

## 2018-06-05 DIAGNOSIS — J302 Other seasonal allergic rhinitis: Secondary | ICD-10-CM

## 2018-06-05 DIAGNOSIS — E785 Hyperlipidemia, unspecified: Secondary | ICD-10-CM

## 2018-06-05 DIAGNOSIS — E1129 Type 2 diabetes mellitus with other diabetic kidney complication: Secondary | ICD-10-CM

## 2018-06-12 ENCOUNTER — Other Ambulatory Visit: Payer: Self-pay | Admitting: Urgent Care

## 2018-06-12 ENCOUNTER — Ambulatory Visit
Admission: RE | Admit: 2018-06-12 | Discharge: 2018-06-12 | Disposition: A | Discharge status: Home / Self Care | Payer: PPO | Source: Ambulatory Visit | Attending: Urgent Care | Admitting: Urgent Care

## 2018-06-12 DIAGNOSIS — R921 Mammographic calcification found on diagnostic imaging of breast: Secondary | ICD-10-CM

## 2018-06-12 DIAGNOSIS — R928 Other abnormal and inconclusive findings on diagnostic imaging of breast: Secondary | ICD-10-CM

## 2018-06-19 ENCOUNTER — Ambulatory Visit (INDEPENDENT_AMBULATORY_CARE_PROVIDER_SITE_OTHER): Payer: PPO | Admitting: General Surgery

## 2018-06-19 ENCOUNTER — Other Ambulatory Visit: Payer: Self-pay | Admitting: General Surgery

## 2018-06-19 ENCOUNTER — Encounter: Payer: Self-pay | Admitting: General Surgery

## 2018-06-19 VITALS — BP 134/72 | HR 94 | Resp 14 | Ht 63.0 in | Wt 184.0 lb

## 2018-06-19 DIAGNOSIS — R920 Mammographic microcalcification found on diagnostic imaging of breast: Secondary | ICD-10-CM

## 2018-06-19 DIAGNOSIS — C50812 Malignant neoplasm of overlapping sites of left female breast: Secondary | ICD-10-CM

## 2018-06-19 DIAGNOSIS — R921 Mammographic calcification found on diagnostic imaging of breast: Secondary | ICD-10-CM | POA: Diagnosis not present

## 2018-06-19 NOTE — Patient Instructions (Addendum)
  Patient to have a left breast stereo biopsy done and lung screening .  The patient is aware to call back for any questions or concerns.

## 2018-06-19 NOTE — Progress Notes (Signed)
Patient ID: Heather Burke, female   DOB: [birth year], 67 y.o.   MRN: [id number]  Chief Complaint  Patient presents with  . Follow-up    HPI Heather Burke is a 67 y.o. female.  who presents for a breast evaluation. The most recent mammogram was done on 06-12-18.  Patient does perform regular self breast checks and gets regular mammograms done. She stop taking Tamoxifen in March, as she reported this was causing significant hypertension.  Last medical oncology note of January 16, 2018 based by recorded a blood pressure of 190/115.Marland Kitchen  No recommendation to discontinue tamoxifen. The patient was originally placed on Femara and then Aromasin without success.  Tamoxifen initiated April 2017.  The patient reports she discontinuing tamoxifen on the advice of medical oncology as her blood pressures been high.  No documentation to this statement can be found in the medical record.    Her blood pressure 0has returned to near normal values on medical therapy.  The patient's had significant hot flashes as well as emotional lability while taking this medication.    HPI  Past Medical History:  Diagnosis Date  . Breast cancer ([place]) 06/08/2015   T1c, N0;  ER+,PR+, Her 2 neg (FISH neg) x2., SLN x 3 negative. Mammoprint: Low risk. Multifocal.Invasive mammary and lobular carcinoma.   Marland Kitchen GERD (gastroesophageal reflux disease)   . Heart murmur 2016   newly diagnosed, slight murmur  . Hyperlipidemia   . Hypertension   . Hypothyroidism   . Lupus ([place])   . PONV (postoperative nausea and vomiting)    with Hysterectomy  . Thyroid disease     Past Surgical History:  Procedure Laterality Date  . ABDOMINAL HYSTERECTOMY    . BREAST BIOPSY Right 2002   negative/ Dr. Bary Castilla  . BREAST BIOPSY Left 06-08-15   INVASIVE MAMMARY CARCINOMA WITH PARTIAL SOLID PATTERN.   . COLONOSCOPY WITH PROPOFOL N/A 09/13/2016   Procedure: COLONOSCOPY WITH PROPOFOL;  Surgeon: Lucilla Lame, MD;  Location: [facility] ENDOSCOPY;  Service:  Endoscopy;  Laterality: N/A;  . core biopsy Left 06/08/15  . GUM SURGERY  2006  . MASTECTOMY Left 2016  . OOPHORECTOMY    . SENTINEL NODE BIOPSY Left 08/18/2015   Procedure: SENTINEL NODE BIOPSY;  Surgeon: [name], MD;  Location: [facility] ORS;  Service: General;  Laterality: Left;  . SIMPLE MASTECTOMY WITH AXILLARY SENTINEL NODE BIOPSY Left 08/18/2015   Procedure: SIMPLE MASTECTOMY;  Surgeon: [name], MD;  Location: [facility] ORS;  Service: General;  Laterality: Left;  . TONSILLECTOMY      Family History  Problem Relation Age of Onset  . Cancer Father        prostate  . Thyroid disease Daughter   . Heart attack Sister   . Breast cancer Cousin   . Breast cancer Cousin   . Breast cancer Other     Social History Social History   Tobacco Use  . Smoking status: Current Every Day Smoker    Packs/day: 2.00    Years: 44.00    Pack years: 88.00    Types: Cigarettes  . Smokeless tobacco: Never Used  Substance Use Topics  . Alcohol use: Yes    Alcohol/week: 12.6 - 16.8 oz    Types: 21 - 28 Cans of beer per week  . Drug use: No    Allergies  Allergen Reactions  . Amlodipine Nausea Only and Other (See Comments)    Stomach cramping  . Atorvastatin Nausea And Vomiting  . Lisinopril  Cough  . Pravastatin Nausea And Vomiting    Current Outpatient Medications  Medication Sig Dispense Refill  . aspirin 81 MG tablet Take 81 mg by mouth daily.    . calcium carbonate (OS-CAL) 600 MG TABS tablet Take 1 tablet (600 mg total) by mouth 2 (two) times daily with a meal. 60 tablet   . Cholecalciferol (D3 ADULT PO) Take 1 tablet by mouth daily. 1000 units daily    . FIBER PO Take by mouth.    Marland Kitchen KLOR-CON M10 10 MEQ tablet TAKE 1 TABLET BY MOUTH EVERY DAY 90 tablet 1  . losartan-hydrochlorothiazide (HYZAAR) 100-25 MG tablet TAKE 1 TABLET BY MOUTH EVERY DAY 90 tablet 1  . lovastatin (MEVACOR) 20 MG tablet TAKE 1 TABLET (20 MG TOTAL) BY MOUTH AT BEDTIME. 90 tablet 1  . metFORMIN  (GLUCOPHAGE) 500 MG tablet TAKE 1 TABLET (500 MG TOTAL) BY MOUTH 2 (TWO) TIMES DAILY WITH A MEAL. 180 tablet 1  . metoprolol tartrate (LOPRESSOR) 100 MG tablet Take 1 tablet (100 mg total) by mouth 2 (two) times daily. 180 tablet 1  . Omega-3 Fatty Acids (FISH OIL) 1200 MG CAPS Take by mouth daily. 1200 mg TID    . SYNTHROID 88 MCG tablet TAKE 1 TABLET BY MOUTH EVERY DAY BEFORE BREAKFAST 90 tablet 1  . fluticasone (FLONASE) 50 MCG/ACT nasal spray SPRAY 2 SPRAYS INTO EACH NOSTRIL EVERY DAY 48 g 1  . tamoxifen (NOLVADEX) 20 MG tablet TAKE 1 TABLET (20 MG TOTAL) BY MOUTH DAILY. (Patient not taking: Reported on 06/19/2018) 90 tablet 3   No current facility-administered medications for this visit.     Review of Systems Review of Systems  Constitutional: Negative.   Respiratory: Negative.   Cardiovascular: Negative.     Blood pressure 134/72, pulse 94, resp. rate 14, height 5\' 3"  (1.6 m), weight 184 lb (83.5 kg).  Physical Exam Physical Exam  Constitutional: She is oriented to person, place, and time. She appears well-developed and well-nourished.  Eyes: Conjunctivae are normal. No scleral icterus.  Neck: Neck supple.  Cardiovascular: Normal rate, regular rhythm and normal heart sounds.  1+ pitting edema right greater than left to mid calf.  Pulmonary/Chest: Effort normal and breath sounds normal. Right breast exhibits no inverted nipple, no mass, no nipple discharge, no skin change and no tenderness. Left breast exhibits no inverted nipple, no mass, no nipple discharge, no skin change and no tenderness.    Lymphadenopathy:    She has no cervical adenopathy.    She has no axillary adenopathy.       Right: No supraclavicular adenopathy present.       Left: No supraclavicular adenopathy present.  Neurological: She is alert and oriented to person, place, and time.  Skin: Skin is warm and dry.    Data Reviewed Screening mammogram of the right breast dated May 29, 2018 and diagnostic  mammogram of June 12, 2018 were reviewed.  Grouping of calcifications posteriorly incompletely visualized on studies 2018 and prior.  Medical oncology and PCP notes from 2019.  Assessment    New microcalcifications in the right breast.  Considering the very posterior location seated biopsy will likely be better tolerated.  Candidate for screening chest CT based on smoking history, information sent to Melinda Crutch, RN at the cancer center.    Plan  Patient to have a left breast stereo biopsy done and lung screening .  The patient is aware to call back for any questions or concerns.  Assuming a  benign biopsy, would plan for a follow-up exam in 1 year.  HPI, Physical Exam, Assessment and Plan have been scribed under the direction and in the presence of Hervey Ard, MD.  Gaspar Cola, CMA  [facility] Malia Corsi 06/20/2018, 7:09 AM

## 2018-06-20 ENCOUNTER — Telehealth: Payer: Self-pay | Admitting: *Deleted

## 2018-06-20 ENCOUNTER — Encounter: Payer: Self-pay | Admitting: Urgent Care

## 2018-06-20 NOTE — Telephone Encounter (Signed)
Received referral for initial lung cancer screening scan. Contacted patient and obtained smoking history, patient is scheduled for a breast biopsy on 06/26/18 and we will discuss scheduling lung screening scan after breast biopsy.

## 2018-06-25 ENCOUNTER — Other Ambulatory Visit: Payer: Self-pay | Admitting: Physician Assistant

## 2018-06-25 DIAGNOSIS — I10 Essential (primary) hypertension: Secondary | ICD-10-CM

## 2018-06-26 ENCOUNTER — Telehealth: Payer: Self-pay | Admitting: *Deleted

## 2018-06-26 ENCOUNTER — Ambulatory Visit
Admission: RE | Admit: 2018-06-26 | Discharge: 2018-06-26 | Disposition: A | Discharge status: Home / Self Care | Payer: PPO | Source: Ambulatory Visit | Attending: General Surgery | Admitting: General Surgery

## 2018-06-26 DIAGNOSIS — R920 Mammographic microcalcification found on diagnostic imaging of breast: Secondary | ICD-10-CM | POA: Insufficient documentation

## 2018-06-26 DIAGNOSIS — N6021 Fibroadenosis of right breast: Secondary | ICD-10-CM | POA: Diagnosis not present

## 2018-06-26 DIAGNOSIS — R921 Mammographic calcification found on diagnostic imaging of breast: Secondary | ICD-10-CM | POA: Diagnosis not present

## 2018-06-26 HISTORY — PX: BREAST BIOPSY: SHX20

## 2018-06-26 NOTE — Telephone Encounter (Signed)
Called patient today to discuss her plan regarding Tamoxifen.  She went of Tamoxifen in March when she saw her PA. Her BP was elevated and this is why she came off of Tamoxifen.  Since that time her BP has decreased.  Patient states she is having a breast biopsy today with Dr. Bary Castilla.  She has discussed her being off Tamoxifen with him.  She states he told her to get the biopsy and they will go from there regarding restarting/discontinuing Tamoxifen.

## 2018-06-26 NOTE — Telephone Encounter (Signed)
-----   Message from Karen Kitchens, NP sent at 06/26/2018  8:25 AM EDT ----- Regarding: Call Need to follow up with patient regarding her tamoxifen. I sent her a message on Mychart and asked her to confirm her plans to restart or discontinue.   When she saw surgery, she told them that we recommended her to stop therapy because of her blood pressure. That is not the case. Her blood pressure is under adequate control with meds prescribed by her PCP. She needs to restart therapy to prevent recurrence.   Document plans.  Thanks,  Gaspar Bidding

## 2018-06-27 LAB — SURGICAL PATHOLOGY

## 2018-06-28 ENCOUNTER — Telehealth: Payer: Self-pay | Admitting: *Deleted

## 2018-06-28 DIAGNOSIS — Z87891 Personal history of nicotine dependence: Secondary | ICD-10-CM

## 2018-06-28 DIAGNOSIS — Z122 Encounter for screening for malignant neoplasm of respiratory organs: Secondary | ICD-10-CM

## 2018-06-28 NOTE — Telephone Encounter (Signed)
Received referral for initial lung cancer screening scan. Contacted patient and obtained smoking history,(current, 98 pack year) as well as answering questions related to screening process. Patient denies signs of lung cancer such as weight loss or hemoptysis. Patient denies comorbidity that would prevent curative treatment if lung cancer were found. Patient is scheduled for shared decision making visit and CT scan on 07/10/18.

## 2018-07-09 ENCOUNTER — Telehealth: Payer: Self-pay | Admitting: *Deleted

## 2018-07-09 NOTE — Telephone Encounter (Signed)
Per Raquel Sarna 07/09/18 staff message to please call the initial lung screening patients for tomorrow for reminder calls. Called patient to reminder her of her scheduled appt scheduled for 07/10/18 @ 1:45 She is aware of location, date and time of her scheduled appt.

## 2018-07-10 ENCOUNTER — Inpatient Hospital Stay: Payer: PPO | Attending: Oncology | Admitting: Oncology

## 2018-07-10 ENCOUNTER — Ambulatory Visit
Admission: RE | Admit: 2018-07-10 | Discharge: 2018-07-10 | Disposition: A | Discharge status: Home / Self Care | Payer: PPO | Source: Ambulatory Visit | Attending: Oncology | Admitting: Oncology

## 2018-07-10 ENCOUNTER — Encounter: Payer: Self-pay | Admitting: Oncology

## 2018-07-10 DIAGNOSIS — I2510 Atherosclerotic heart disease of native coronary artery without angina pectoris: Secondary | ICD-10-CM | POA: Diagnosis not present

## 2018-07-10 DIAGNOSIS — I700 Atherosclerosis of aorta: Secondary | ICD-10-CM | POA: Diagnosis not present

## 2018-07-10 DIAGNOSIS — Z87891 Personal history of nicotine dependence: Secondary | ICD-10-CM

## 2018-07-10 DIAGNOSIS — Z122 Encounter for screening for malignant neoplasm of respiratory organs: Secondary | ICD-10-CM | POA: Diagnosis not present

## 2018-07-10 DIAGNOSIS — J432 Centrilobular emphysema: Secondary | ICD-10-CM | POA: Insufficient documentation

## 2018-07-10 DIAGNOSIS — F17210 Nicotine dependence, cigarettes, uncomplicated: Secondary | ICD-10-CM | POA: Diagnosis not present

## 2018-07-10 NOTE — Progress Notes (Signed)
In accordance with CMS guidelines, patient has met eligibility criteria including age, absence of signs or symptoms of lung cancer.  Social History   Tobacco Use  . Smoking status: Current Every Day Smoker    Packs/day: 2.00    Years: 49.00    Pack years: 98.00    Types: Cigarettes  . Smokeless tobacco: Never Used  Substance Use Topics  . Alcohol use: Yes    Alcohol/week: 12.6 - 16.8 oz    Types: 21 - 28 Cans of beer per week  . Drug use: No     A shared decision-making session was conducted prior to the performance of CT scan. This includes one or more decision aids, includes benefits and harms of screening, follow-up diagnostic testing, over-diagnosis, false positive rate, and total radiation exposure.  Counseling on the importance of adherence to annual lung cancer LDCT screening, impact of co-morbidities, and ability or willingness to undergo diagnosis and treatment is imperative for compliance of the program.  Counseling on the importance of continued smoking cessation for former smokers; the importance of smoking cessation for current smokers, and information about tobacco cessation interventions have been given to patient including [facility] and [employer] quit [place] programs.  Written order for lung cancer screening with LDCT has been given to the patient and any and all questions have been answered to the best of my abilities.   Yearly follow up will be coordinated by Burgess Estelle, Thoracic Navigator.  Faythe Casa, NP 07/10/2018 2:14 PM

## 2018-07-12 ENCOUNTER — Encounter: Payer: Self-pay | Admitting: *Deleted

## 2018-07-17 ENCOUNTER — Ambulatory Visit: Payer: PPO | Admitting: Urgent Care

## 2018-07-17 ENCOUNTER — Other Ambulatory Visit: Payer: PPO

## 2018-07-22 ENCOUNTER — Other Ambulatory Visit: Payer: Self-pay | Admitting: Physician Assistant

## 2018-07-22 DIAGNOSIS — E876 Hypokalemia: Secondary | ICD-10-CM

## 2018-08-23 ENCOUNTER — Ambulatory Visit (INDEPENDENT_AMBULATORY_CARE_PROVIDER_SITE_OTHER): Payer: PPO

## 2018-08-23 VITALS — BP 186/88 | HR 69 | Temp 98.6°F | Ht 63.0 in | Wt 184.8 lb

## 2018-08-23 DIAGNOSIS — Z23 Encounter for immunization: Secondary | ICD-10-CM | POA: Diagnosis not present

## 2018-08-23 DIAGNOSIS — Z0000 Encounter for general adult medical examination without abnormal findings: Secondary | ICD-10-CM

## 2018-08-23 NOTE — Progress Notes (Signed)
Subjective:   Heather Burke is a 67 y.o. female who presents for Medicare Annual (Subsequent) preventive examination.  Review of Systems:  N/A  Cardiac Risk Factors include: advanced age (>42men, >55 women);dyslipidemia;diabetes mellitus;hypertension;obesity (BMI >30kg/m2);smoking/ tobacco exposure     Objective:     Vitals: BP (!) 186/88 (BP Location: Right Arm)   Pulse 69   Temp 98.6 F (37 C) (Oral)   Ht 5\' 3"  (1.6 m)   Wt 184 lb 12.8 oz (83.8 kg)   BMI 32.74 kg/m   Body mass index is 32.74 kg/m.  Advanced Directives 08/23/2018 01/16/2018 07/21/2017 07/07/2017 12/22/2016 08/25/2016 04/15/2016  Does Patient Have a Medical Advance Directive? Yes No No No No No No  Type of Advance Directive [place] in Chart? No - copy requested - - - - - -  Would patient like information on creating a medical advance directive? - - No - Patient declined - - - -    Tobacco Social History   Tobacco Use  Smoking Status Current Every Day Smoker  . Packs/day: 2.00  . Years: 49.00  . Pack years: 98.00  . Types: Cigarettes  Smokeless Tobacco Never Used     Ready to quit: No Counseling given: No   Clinical Intake:  Pre-visit preparation completed: Yes  Pain : No/denies pain Pain Score: 0-No pain     Nutritional Status: BMI > 30  Obese Nutritional Risks: None Diabetes: Yes(type 2 ) CBG done?: No Did pt. bring in CBG monitor from home?: No  How often do you need to have someone help you when you read instructions, pamphlets, or other written materials from your doctor or pharmacy?: 1 - Never  Interpreter Needed?: No  Information entered by :: [facility], LPN  Past Medical History:  Diagnosis Date  . Breast cancer ([place]) 06/08/2015   T1c, N0;  ER+,PR+, Her 2 neg (FISH neg) x2., SLN x 3 negative. Mammoprint: Low risk. Multifocal.Invasive mammary and lobular carcinoma.   Marland Kitchen GERD (gastroesophageal reflux disease)     . Heart murmur 2016   newly diagnosed, slight murmur  . Hyperlipidemia   . Hypertension   . Hypothyroidism   . Lupus ([place])   . PONV (postoperative nausea and vomiting)    with Hysterectomy  . Thyroid disease    Past Surgical History:  Procedure Laterality Date  . ABDOMINAL HYSTERECTOMY    . BREAST BIOPSY Right 2002   negative/ Dr. Bary Castilla  . BREAST BIOPSY Left 06-08-15   INVASIVE MAMMARY CARCINOMA WITH PARTIAL SOLID PATTERN.   Marland Kitchen BREAST BIOPSY Right 06/26/2018   stereo affirm bx/path pending  . COLONOSCOPY WITH PROPOFOL N/A 09/13/2016   Procedure: COLONOSCOPY WITH PROPOFOL;  Surgeon: Lucilla Lame, MD;  Location: [facility] ENDOSCOPY;  Service: Endoscopy;  Laterality: N/A;  . core biopsy Left 06/08/15  . GUM SURGERY  2006  . MASTECTOMY Left 2016  . OOPHORECTOMY    . SENTINEL NODE BIOPSY Left 08/18/2015   Procedure: SENTINEL NODE BIOPSY;  Surgeon: [name], MD;  Location: [facility] ORS;  Service: General;  Laterality: Left;  . SIMPLE MASTECTOMY WITH AXILLARY SENTINEL NODE BIOPSY Left 08/18/2015   Procedure: SIMPLE MASTECTOMY;  Surgeon: [name], MD;  Location: [facility] ORS;  Service: General;  Laterality: Left;  . TONSILLECTOMY     Family History  Problem Relation Age of Onset  . Cancer Father        prostate  .  Thyroid disease Daughter   . Heart attack Sister   . Breast cancer Cousin   . Breast cancer Cousin   . Breast cancer Other    Social History   Socioeconomic History  . Marital status: Widowed    Spouse name: Not on file  . Number of children: 2  . Years of education: H/S  . Highest education level: High school graduate  Occupational History  . Occupation: Part-Time  Social Needs  . Financial resource strain: Not hard at all  . Food insecurity:    Worry: Never true    Inability: Never true  . Transportation needs:    Medical: No    Non-medical: No  Tobacco Use  . Smoking status: Current Every Day Smoker    Packs/day: 2.00    Years: 49.00    Pack  years: 98.00    Types: Cigarettes  . Smokeless tobacco: Never Used  Substance and Sexual Activity  . Alcohol use: Yes    Alcohol/week: 14.0 - 28.0 standard drinks    Types: 14 - 28 Cans of beer per week  . Drug use: No  . Sexual activity: Not on file  Lifestyle  . Physical activity:    Days per week: Not on file    Minutes per session: Not on file  . Stress: To some extent  Relationships  . Social connections:    Talks on phone: Not on file    Gets together: Not on file    Attends religious service: Not on file    Active member of club or organization: Not on file    Attends meetings of clubs or organizations: Not on file    Relationship status: Not on file  Other Topics Concern  . Not on file  Social History Narrative  . Not on file    Outpatient Encounter Medications as of 08/23/2018  Medication Sig  . aspirin 81 MG tablet Take 81 mg by mouth daily.  . calcium carbonate (OS-CAL) 600 MG TABS tablet Take 1 tablet (600 mg total) by mouth 2 (two) times daily with a meal.  . Cholecalciferol (D3 ADULT PO) Take 1 tablet by mouth daily. 1000 units daily  . FIBER PO Take by mouth daily.   . fluticasone (FLONASE) 50 MCG/ACT nasal spray SPRAY 2 SPRAYS INTO EACH NOSTRIL EVERY DAY  . KLOR-CON M10 10 MEQ tablet TAKE 1 TABLET BY MOUTH EVERY DAY  . losartan-hydrochlorothiazide (HYZAAR) 100-25 MG tablet TAKE 1 TABLET BY MOUTH DAILY.  Marland Kitchen lovastatin (MEVACOR) 20 MG tablet TAKE 1 TABLET (20 MG TOTAL) BY MOUTH AT BEDTIME.  . metFORMIN (GLUCOPHAGE) 500 MG tablet TAKE 1 TABLET (500 MG TOTAL) BY MOUTH 2 (TWO) TIMES DAILY WITH A MEAL.  . metoprolol tartrate (LOPRESSOR) 100 MG tablet Take 1 tablet (100 mg total) by mouth 2 (two) times daily.  . Omega-3 Fatty Acids (FISH OIL) 1200 MG CAPS Take by mouth daily. 1200 mg TID  . SYNTHROID 88 MCG tablet TAKE 1 TABLET BY MOUTH EVERY DAY BEFORE BREAKFAST  . tamoxifen (NOLVADEX) 20 MG tablet TAKE 1 TABLET (20 MG TOTAL) BY MOUTH DAILY. (Patient not taking:  Reported on 06/19/2018)   No facility-administered encounter medications on file as of 08/23/2018.     Activities of Daily Living In your present state of health, do you have any difficulty performing the following activities: 08/23/2018  Hearing? N  Vision? N  Difficulty concentrating or making decisions? N  Walking or climbing stairs? N  Dressing or bathing? N  Doing errands, shopping? N  Preparing Food and eating ? N  Using the Toilet? N  In the past six months, have you accidently leaked urine? Y  Comment Occasionally with pressure.   Do you have problems with loss of bowel control? N  Managing your Medications? N  Managing your Finances? N  Housekeeping or managing your Housekeeping? N  Some recent data might be hidden    Patient Care Team: [name], PA-C as PCP - General (Family Medicine) Bary Castilla, Forest Gleason, MD (General Surgery) Lorelee Cover., MD as Consulting Physician (Ophthalmology)    Assessment:   This is a routine wellness examination for [facility].  Exercise Activities and Dietary recommendations Current Exercise Habits: The patient does not participate in regular exercise at present, Exercise limited by: None identified  Goals    . Reduce sugar intake      Recommend decreasing junk food as snacks and increasing more healthy snacks.        Fall Risk Fall Risk  08/23/2018 07/21/2017 07/21/2017  Falls in the past year? No No No   Is the patient's home free of loose throw rugs in walkways, pet beds, electrical cords, etc?   yes      Grab bars in the bathroom? no      Handrails on the stairs?   yes      Adequate lighting?   yes  Timed Get Up and Go performed: N/A  Depression Screen PHQ 2/9 Scores 08/23/2018 07/21/2017 07/21/2017  PHQ - 2 Score 0 0 0     Cognitive Function: Pt declined screening today.         Immunization History  Administered Date(s) Administered  . Influenza Split 10/21/2010, 10/13/2012  . Influenza,inj,Quad PF,6+ Mos  10/02/2014, 09/09/2015  . Influenza-Unspecified 09/21/2016, 10/03/2017  . Pneumococcal Conjugate-13 07/21/2017  . Pneumococcal Polysaccharide-23 01/22/2013, 08/23/2018  . Tdap 10/21/2010  . Zoster 10/03/2013    Qualifies for Shingles Vaccine? Due for Shingles vaccine. Declined my offer to administer today. Education has been provided regarding the importance of this vaccine. Pt has been advised to call her insurance company to determine her out of pocket expense. Advised she may also receive this vaccine at her local pharmacy or Health Dept. Verbalized acceptance and understanding.  Screening Tests Health Maintenance  Topic Date Due  . OPHTHALMOLOGY EXAM  11/18/2017  . HEMOGLOBIN A1C  07/16/2018  . INFLUENZA VACCINE  07/19/2018  . FOOT EXAM  07/28/2018  . MAMMOGRAM  06/12/2020  . TETANUS/TDAP  10/21/2020  . COLONOSCOPY  09/13/2026  . DEXA SCAN  Completed  . Hepatitis C Screening  Completed  . PNA vac Low Risk Adult  Completed    Cancer Screenings: Lung: Low Dose CT Chest recommended if Age 72-80 years, 30 pack-year currently smoking OR have quit w/in 15years. Patient does qualify, however completed scan 06/2018. Breast:  Up to date on Mammogram? Yes   Up to date of Bone Density/Dexa? Yes Colorectal: Up to date  Additional Screenings:  Hepatitis C Screening: Up to date     Plan:  I have personally reviewed and addressed the Medicare Annual Wellness questionnaire and have noted the following in the patient's chart:  A. Medical and social history B. Use of alcohol, tobacco or illicit drugs  C. Current medications and supplements D. Functional ability and status E.  Nutritional status F.  Physical activity G. Advance directives H. List of other physicians I.  Hospitalizations, surgeries, and ER visits in previous 12 months J.  Vitals K. Screenings such as hearing and vision if needed, cognitive and depression L. Referrals and appointments - none  In addition, I have  reviewed and discussed with patient certain preventive protocols, quality metrics, and best practice recommendations. A written personalized care plan for preventive services as well as general preventive health recommendations were provided to patient.  See attached scanned questionnaire for additional information.   Signed,  Fabio Neighbors, LPN Nurse Health Advisor   Nurse Recommendations: Pt needs a diabetic foot exam and Hgb A1c checked at next OV. Pt plans to set up and eye exam by the end of this year. Pt declined the influenza vaccine today. Both of pts BP readings today were high. Pt states she is normally that high in the clinic. Pt declined any s/s. Advised pt to check BP daily, record readings and bring in to next apt.

## 2018-08-23 NOTE — Patient Instructions (Addendum)
Ms. Gotwalt , Thank you for taking time to come for your Medicare Wellness Visit. I appreciate your ongoing commitment to your health goals. Please review the following plan we discussed and let me know if I can assist you in the future.   Screening recommendations/referrals: Colonoscopy: Up to date Mammogram: Up to date Bone Density: Up to date Recommended yearly ophthalmology/optometry visit for glaucoma screening and checkup Recommended yearly dental visit for hygiene and checkup  Vaccinations: Influenza vaccine: Pt declines today.  Pneumococcal vaccine: Up to date Tdap vaccine: Up to date Shingles vaccine: Pt declines today.     Advanced directives: Please bring a copy of your POA (Power of Attorney) and/or Living Will to your next appointment.   Conditions/risks identified: Obesity- continue trying to cut out junk foods in diet and trying substituting for fruits, vegetables or protein related snacks.   Next appointment: 08/30/18 @ 10 AM with Fenton Malling. Pt declined setting up the AWV for 2020.    Preventive Care 24 Years and Older, Female Preventive care refers to lifestyle choices and visits with your health care provider that can promote health and wellness. What does preventive care include?  A yearly physical exam. This is also called an annual well check.  Dental exams once or twice a year.  Routine eye exams. Ask your health care provider how often you should have your eyes checked.  Personal lifestyle choices, including:  Daily care of your teeth and gums.  Regular physical activity.  Eating a healthy diet.  Avoiding tobacco and drug use.  Limiting alcohol use.  Practicing safe sex.  Taking low-dose aspirin every day.  Taking vitamin and mineral supplements as recommended by your health care provider. What happens during an annual well check? The services and screenings done by your health care provider during your annual well check will depend on  your age, overall health, lifestyle risk factors, and family history of disease. Counseling  Your health care provider may ask you questions about your:  Alcohol use.  Tobacco use.  Drug use.  Emotional well-being.  Home and relationship well-being.  Sexual activity.  Eating habits.  History of falls.  Memory and ability to understand (cognition).  Work and work Statistician.  Reproductive health. Screening  You may have the following tests or measurements:  Height, weight, and BMI.  Blood pressure.  Lipid and cholesterol levels. These may be checked every 5 years, or more frequently if you are over 77 years old.  Skin check.  Lung cancer screening. You may have this screening every year starting at age 51 if you have a 30-pack-year history of smoking and currently smoke or have quit within the past 15 years.  Fecal occult blood test (FOBT) of the stool. You may have this test every year starting at age 41.  Flexible sigmoidoscopy or colonoscopy. You may have a sigmoidoscopy every 5 years or a colonoscopy every 10 years starting at age 66.  Hepatitis C blood test.  Hepatitis B blood test.  Sexually transmitted disease (STD) testing.  Diabetes screening. This is done by checking your blood sugar (glucose) after you have not eaten for a while (fasting). You may have this done every 1-3 years.  Bone density scan. This is done to screen for osteoporosis. You may have this done starting at age 51.  Mammogram. This may be done every 1-2 years. Talk to your health care provider about how often you should have regular mammograms. Talk with your health care provider about  your test results, treatment options, and if necessary, the need for more tests. Vaccines  Your health care provider may recommend certain vaccines, such as:  Influenza vaccine. This is recommended every year.  Tetanus, diphtheria, and acellular pertussis (Tdap, Td) vaccine. You may need a Td booster  every 10 years.  Zoster vaccine. You may need this after age 18.  Pneumococcal 13-valent conjugate (PCV13) vaccine. One dose is recommended after age 28.  Pneumococcal polysaccharide (PPSV23) vaccine. One dose is recommended after age 62. Talk to your health care provider about which screenings and vaccines you need and how often you need them. This information is not intended to replace advice given to you by your health care provider. Make sure you discuss any questions you have with your health care provider. Document Released: 01/01/2016 Document Revised: 08/24/2016 Document Reviewed: 10/06/2015 Elsevier Interactive Patient Education  2017 [place] Prevention in the Home Falls can cause injuries. They can happen to people of all ages. There are many things you can do to make your home safe and to help prevent falls. What can I do on the outside of my home?  Regularly fix the edges of walkways and driveways and fix any cracks.  Remove anything that might make you trip as you walk through a door, such as a raised step or threshold.  Trim any bushes or trees on the path to your home.  Use bright outdoor lighting.  Clear any walking paths of anything that might make someone trip, such as rocks or tools.  Regularly check to see if handrails are loose or broken. Make sure that both sides of any steps have handrails.  Any raised decks and porches should have guardrails on the edges.  Have any leaves, snow, or ice cleared regularly.  Use sand or salt on walking paths during winter.  Clean up any spills in your garage right away. This includes oil or grease spills. What can I do in the bathroom?  Use night lights.  Install grab bars by the toilet and in the tub and shower. Do not use towel bars as grab bars.  Use non-skid mats or decals in the tub or shower.  If you need to sit down in the shower, use a plastic, non-slip stool.  Keep the floor dry. Clean up any  water that spills on the floor as soon as it happens.  Remove soap buildup in the tub or shower regularly.  Attach bath mats securely with double-sided non-slip rug tape.  Do not have throw rugs and other things on the floor that can make you trip. What can I do in the bedroom?  Use night lights.  Make sure that you have a light by your bed that is easy to reach.  Do not use any sheets or blankets that are too big for your bed. They should not hang down onto the floor.  Have a firm chair that has side arms. You can use this for support while you get dressed.  Do not have throw rugs and other things on the floor that can make you trip. What can I do in the kitchen?  Clean up any spills right away.  Avoid walking on wet floors.  Keep items that you use a lot in easy-to-reach places.  If you need to reach something above you, use a strong step stool that has a grab bar.  Keep electrical cords out of the way.  Do not use floor polish or wax  that makes floors slippery. If you must use wax, use non-skid floor wax.  Do not have throw rugs and other things on the floor that can make you trip. What can I do with my stairs?  Do not leave any items on the stairs.  Make sure that there are handrails on both sides of the stairs and use them. Fix handrails that are broken or loose. Make sure that handrails are as long as the stairways.  Check any carpeting to make sure that it is firmly attached to the stairs. Fix any carpet that is loose or worn.  Avoid having throw rugs at the top or bottom of the stairs. If you do have throw rugs, attach them to the floor with carpet tape.  Make sure that you have a light switch at the top of the stairs and the bottom of the stairs. If you do not have them, ask someone to add them for you. What else can I do to help prevent falls?  Wear shoes that:  Do not have high heels.  Have rubber bottoms.  Are comfortable and fit you well.  Are closed  at the toe. Do not wear sandals.  If you use a stepladder:  Make sure that it is fully opened. Do not climb a closed stepladder.  Make sure that both sides of the stepladder are locked into place.  Ask someone to hold it for you, if possible.  Clearly mark and make sure that you can see:  Any grab bars or handrails.  First and last steps.  Where the edge of each step is.  Use tools that help you move around (mobility aids) if they are needed. These include:  Canes.  Walkers.  Scooters.  Crutches.  Turn on the lights when you go into a dark area. Replace any light bulbs as soon as they burn out.  Set up your furniture so you have a clear path. Avoid moving your furniture around.  If any of your floors are uneven, fix them.  If there are any pets around you, be aware of where they are.  Review your medicines with your doctor. Some medicines can make you feel dizzy. This can increase your chance of falling. Ask your doctor what other things that you can do to help prevent falls. This information is not intended to replace advice given to you by your health care provider. Make sure you discuss any questions you have with your health care provider. Document Released: 10/01/2009 Document Revised: 05/12/2016 Document Reviewed: 01/09/2015 Elsevier Interactive Patient Education  2017 [employer].

## 2018-08-30 ENCOUNTER — Encounter: Payer: Self-pay | Admitting: Physician Assistant

## 2018-08-30 ENCOUNTER — Ambulatory Visit (INDEPENDENT_AMBULATORY_CARE_PROVIDER_SITE_OTHER): Payer: PPO | Admitting: Physician Assistant

## 2018-08-30 VITALS — BP 170/100 | HR 65 | Temp 98.0°F | Resp 16 | Ht 63.0 in | Wt 183.6 lb

## 2018-08-30 DIAGNOSIS — E559 Vitamin D deficiency, unspecified: Secondary | ICD-10-CM

## 2018-08-30 DIAGNOSIS — E039 Hypothyroidism, unspecified: Secondary | ICD-10-CM

## 2018-08-30 DIAGNOSIS — Z2821 Immunization not carried out because of patient refusal: Secondary | ICD-10-CM | POA: Diagnosis not present

## 2018-08-30 DIAGNOSIS — Z0000 Encounter for general adult medical examination without abnormal findings: Secondary | ICD-10-CM

## 2018-08-30 DIAGNOSIS — E7800 Pure hypercholesterolemia, unspecified: Secondary | ICD-10-CM | POA: Diagnosis not present

## 2018-08-30 DIAGNOSIS — I10 Essential (primary) hypertension: Secondary | ICD-10-CM

## 2018-08-30 DIAGNOSIS — E1129 Type 2 diabetes mellitus with other diabetic kidney complication: Secondary | ICD-10-CM

## 2018-08-30 DIAGNOSIS — F17200 Nicotine dependence, unspecified, uncomplicated: Secondary | ICD-10-CM | POA: Diagnosis not present

## 2018-08-30 MED ORDER — METOPROLOL SUCCINATE ER 200 MG PO TB24: 200.0000 mg | ORAL_TABLET | Freq: Every day | ORAL | 1 refills | Status: DC

## 2018-08-30 MED ORDER — LEVOTHYROXINE SODIUM 88 MCG PO TABS
88.0000 ug | ORAL_TABLET | Freq: Every day | ORAL | 3 refills | Status: DC
Start: 2018-08-30 — End: 2019-07-31

## 2018-08-30 NOTE — Progress Notes (Signed)
Patient: Heather Burke, Female    DOB: 1951-01-06, 67 y.o.   MRN: [id number] Visit Date: 08/30/2018  Today's Provider: [name], PA-C   Chief Complaint  Patient presents with  . Annual Exam   Subjective:     Complete Physical Heather Burke is a 67 y.o. female. She feels fairly well. She reports exercising none. She reports she is sleeping fairly well.  AWE: 08/23/18 with NHA  Does have a lot of external stress currently. Feels her BP may be up secondary to that. Reports compliance with medications. Denies headache, visual changes, chest pain. -----------------------------------------------------------   Review of Systems  Constitutional: Negative.   HENT: Negative.   Eyes: Negative.   Respiratory: Negative.   Cardiovascular: Negative.   Gastrointestinal: Negative.   Endocrine: Negative.   Genitourinary: Negative.   Musculoskeletal: Negative.   Skin: Negative.   Allergic/Immunologic: Negative.   Neurological: Negative.   Hematological: Negative.   Psychiatric/Behavioral: Negative.     Social History   Socioeconomic History  . Marital status: Widowed    Spouse name: Not on file  . Number of children: 2  . Years of education: H/S  . Highest education level: High school graduate  Occupational History  . Occupation: Part-Time  Social Needs  . Financial resource strain: Not hard at all  . Food insecurity:    Worry: Never true    Inability: Never true  . Transportation needs:    Medical: No    Non-medical: No  Tobacco Use  . Smoking status: Current Every Day Smoker    Packs/day: 2.00    Years: 49.00    Pack years: 98.00    Types: Cigarettes  . Smokeless tobacco: Never Used  Substance and Sexual Activity  . Alcohol use: Yes    Alcohol/week: 14.0 - 28.0 standard drinks    Types: 14 - 28 Cans of beer per week  . Drug use: No  . Sexual activity: Not on file  Lifestyle  . Physical activity:    Days per week: Not on file   Minutes per session: Not on file  . Stress: To some extent  Relationships  . Social connections:    Talks on phone: Not on file    Gets together: Not on file    Attends religious service: Not on file    Active member of club or organization: Not on file    Attends meetings of clubs or organizations: Not on file    Relationship status: Not on file  . Intimate partner violence:    Fear of current or ex partner: Not on file    Emotionally abused: Not on file    Physically abused: Not on file    Forced sexual activity: Not on file  Other Topics Concern  . Not on file  Social History Narrative  . Not on file    Past Medical History:  Diagnosis Date  . Breast cancer ([place]) 06/08/2015   T1c, N0;  ER+,PR+, Her 2 neg (FISH neg) x2., SLN x 3 negative. Mammoprint: Low risk. Multifocal.Invasive mammary and lobular carcinoma.   Marland Kitchen GERD (gastroesophageal reflux disease)   . Heart murmur 2016   newly diagnosed, slight murmur  . Hyperlipidemia   . Hypertension   . Hypothyroidism   . Lupus ([place])   . PONV (postoperative nausea and vomiting)    with Hysterectomy  . Thyroid disease      Patient Active Problem List   Diagnosis Date Noted  .  Benign neoplasm of sigmoid colon   . Mass of right breast 02/17/2016  . History of parotitis 01/05/2016  . Hypothyroidism 09/29/2015  . Hyperlipemia 09/29/2015  . Newly recognized murmur 09/29/2015  . Allergic rhinitis 08/27/2015  . Anxiety 08/27/2015  . Type 2 diabetes mellitus with other diabetic kidney complication ([place]) 58/08/9832  . Avitaminosis D 08/27/2015  . Compulsive tobacco user syndrome 08/27/2015  . Hypertension 07/01/2015  . Breast cancer, stage 1 ([place]) 06/17/2015    Past Surgical History:  Procedure Laterality Date  . ABDOMINAL HYSTERECTOMY    . BREAST BIOPSY Right 2002   negative/ Dr. Bary Castilla  . BREAST BIOPSY Left 06-08-15   INVASIVE MAMMARY CARCINOMA WITH PARTIAL SOLID PATTERN.   Marland Kitchen BREAST BIOPSY Right 06/26/2018   stereo  affirm bx/path pending  . COLONOSCOPY WITH PROPOFOL N/A 09/13/2016   Procedure: COLONOSCOPY WITH PROPOFOL;  Surgeon: Lucilla Lame, MD;  Location: [facility] ENDOSCOPY;  Service: Endoscopy;  Laterality: N/A;  . core biopsy Left 06/08/15  . GUM SURGERY  2006  . MASTECTOMY Left 2016  . OOPHORECTOMY    . SENTINEL NODE BIOPSY Left 08/18/2015   Procedure: SENTINEL NODE BIOPSY;  Surgeon: [name], MD;  Location: [facility] ORS;  Service: General;  Laterality: Left;  . SIMPLE MASTECTOMY WITH AXILLARY SENTINEL NODE BIOPSY Left 08/18/2015   Procedure: SIMPLE MASTECTOMY;  Surgeon: [name], MD;  Location: [facility] ORS;  Service: General;  Laterality: Left;  . TONSILLECTOMY      Her family history includes Breast cancer in her cousin, cousin, and other; Cancer in her father; Heart attack in her sister; Thyroid disease in her daughter.      Current Outpatient Medications:  .  aspirin 81 MG tablet, Take 81 mg by mouth daily., Disp: , Rfl:  .  calcium carbonate (OS-CAL) 600 MG TABS tablet, Take 1 tablet (600 mg total) by mouth 2 (two) times daily with a meal., Disp: 60 tablet, Rfl:  .  Cholecalciferol (D3 ADULT PO), Take 1 tablet by mouth daily. 1000 units daily, Disp: , Rfl:  .  FIBER PO, Take by mouth daily. , Disp: , Rfl:  .  fluticasone (FLONASE) 50 MCG/ACT nasal spray, SPRAY 2 SPRAYS INTO EACH NOSTRIL EVERY DAY, Disp: 48 g, Rfl: 1 .  KLOR-CON M10 10 MEQ tablet, TAKE 1 TABLET BY MOUTH EVERY DAY, Disp: 90 tablet, Rfl: 1 .  losartan-hydrochlorothiazide (HYZAAR) 100-25 MG tablet, TAKE 1 TABLET BY MOUTH DAILY., Disp: 90 tablet, Rfl: 1 .  lovastatin (MEVACOR) 20 MG tablet, TAKE 1 TABLET (20 MG TOTAL) BY MOUTH AT BEDTIME., Disp: 90 tablet, Rfl: 1 .  metFORMIN (GLUCOPHAGE) 500 MG tablet, TAKE 1 TABLET (500 MG TOTAL) BY MOUTH 2 (TWO) TIMES DAILY WITH A MEAL., Disp: 180 tablet, Rfl: 1 .  metoprolol tartrate (LOPRESSOR) 100 MG tablet, Take 1 tablet (100 mg total) by mouth 2 (two) times daily., Disp: 180 tablet,  Rfl: 1 .  Omega-3 Fatty Acids (FISH OIL) 1200 MG CAPS, Take by mouth daily. 1200 mg TID, Disp: , Rfl:  .  SYNTHROID 88 MCG tablet, TAKE 1 TABLET BY MOUTH EVERY DAY BEFORE BREAKFAST, Disp: 90 tablet, Rfl: 1 .  tamoxifen (NOLVADEX) 20 MG tablet, TAKE 1 TABLET (20 MG TOTAL) BY MOUTH DAILY. (Patient not taking: Reported on 06/19/2018), Disp: 90 tablet, Rfl: 3  Patient Care Team: [name], PA-C as PCP - General (Family Medicine) Bary Castilla, Forest Gleason, MD (General Surgery) Lorelee Cover., MD as Consulting Physician (Ophthalmology)     Objective:  Vitals: BP (!) 170/100 (BP Location: Left Arm, Patient Position: Sitting, Cuff Size: Normal)   Pulse 65   Temp 98 F (36.7 C) (Oral)   Resp 16   Ht 5\' 3"  (1.6 m)   Wt 183 lb 9.6 oz (83.3 kg)   SpO2 98%   BMI 32.52 kg/m   Physical Exam  Constitutional: She is oriented to person, place, and time. She appears well-developed and well-nourished.  HENT:  Head: Normocephalic and atraumatic.  Right Ear: Hearing, tympanic membrane, external ear and ear canal normal.  Left Ear: Hearing, tympanic membrane, external ear and ear canal normal.  Nose: Nose normal.  Mouth/Throat: Uvula is midline, oropharynx is clear and moist and mucous membranes are normal.  Eyes: Pupils are equal, round, and reactive to light. Conjunctivae and EOM are normal.  Neck: Normal range of motion. Neck supple. Carotid bruit is not present.  Cardiovascular: Normal rate, regular rhythm, normal heart sounds and intact distal pulses.  Pulses:      Dorsalis pedis pulses are 2+ on the right side, and 2+ on the left side.       Posterior tibial pulses are 2+ on the right side, and 2+ on the left side.  Pulmonary/Chest: Effort normal and breath sounds normal.  Breast exam deferred, done by Dr. Bary Castilla, gen surgery  Abdominal: Soft. Bowel sounds are normal.  Musculoskeletal: Normal range of motion.       Right foot: There is normal range of motion and no deformity.        Left foot: There is normal range of motion and no deformity.  Feet:  Right Foot:  Protective Sensation: 10 sites tested. 10 sites sensed.  Skin Integrity: Negative for ulcer, blister, skin breakdown, erythema, warmth, callus or dry skin.  Left Foot:  Protective Sensation: 10 sites tested. 10 sites sensed.  Skin Integrity: Negative for ulcer, blister, skin breakdown, erythema, warmth, callus or dry skin.  Neurological: She is alert and oriented to person, place, and time.  Skin: Skin is warm and dry.  Psychiatric: She has a normal mood and affect. Her behavior is normal. Judgment and thought content normal.  Vitals reviewed.   Activities of Daily Living In your present state of health, do you have any difficulty performing the following activities: 08/23/2018  Hearing? N  Vision? N  Difficulty concentrating or making decisions? N  Walking or climbing stairs? N  Dressing or bathing? N  Doing errands, shopping? N  Preparing Food and eating ? N  Using the Toilet? N  In the past six months, have you accidently leaked urine? Y  Comment Occasionally with pressure.   Do you have problems with loss of bowel control? N  Managing your Medications? N  Managing your Finances? N  Housekeeping or managing your Housekeeping? N  Some recent data might be hidden    Fall Risk Assessment Fall Risk  08/23/2018 07/21/2017 07/21/2017  Falls in the past year? No No No     Depression Screen PHQ 2/9 Scores 08/23/2018 07/21/2017 07/21/2017  PHQ - 2 Score 0 0 0    Cognitive Testing - 6-CIT:Pt declined screening w/NHA on 08/23/18    Assessment & Plan:    Annual Physical Reviewed patient's Family Medical History Reviewed and updated list of patient's medical providers Assessment of cognitive impairment was done Assessed patient's functional ability Established a written schedule for health screening [place] Completed and Reviewed  Exercise Activities and Dietary  recommendations Goals    . Reduce  sugar intake      Recommend decreasing junk food as snacks and increasing more healthy snacks.        Immunization History  Administered Date(s) Administered  . Influenza Split 10/21/2010, 10/13/2012  . Influenza,inj,Quad PF,6+ Mos 10/02/2014, 09/09/2015  . Influenza-Unspecified 09/21/2016, 10/03/2017  . Pneumococcal Conjugate-13 07/21/2017  . Pneumococcal Polysaccharide-23 01/22/2013, 08/23/2018  . Tdap 10/21/2010  . Zoster 10/03/2013    Health Maintenance  Topic Date Due  . OPHTHALMOLOGY EXAM  11/18/2017  . HEMOGLOBIN A1C  07/16/2018  . INFLUENZA VACCINE  07/19/2018  . FOOT EXAM  07/28/2018  . MAMMOGRAM  06/12/2020  . TETANUS/TDAP  10/21/2020  . COLONOSCOPY  09/13/2026  . DEXA SCAN  Completed  . Hepatitis C Screening  Completed  . PNA vac Low Risk Adult  Completed     Discussed health benefits of physical activity, and encouraged her to engage in regular exercise appropriate for her age and condition.    1. Annual physical exam Normal physical exam today. Will check labs as below and f/u pending lab results. If labs are stable and WNL she will not need to have these rechecked for one year at her next annual physical exam. She is to call the office in the meantime if she has any acute issue, questions or concerns. - CBC with Differential/Platelet - Comprehensive metabolic panel  2. Type 2 diabetes mellitus with other diabetic kidney complication (HCC) Stable. Continue metformin 500mg  BID. Also on losartan for renal protection.  Will check labs as below and f/u pending results. - Hemoglobin A1c  3. Hypothyroidism, unspecified type Stable. Diagnosis pulled for medication refill. Continue current medical treatment plan. Will check labs as below and f/u pending results. - TSH - levothyroxine (SYNTHROID, LEVOTHROID) 88 MCG tablet; Take 1 tablet (88 mcg total) by mouth daily.  Dispense: 90 tablet; Refill: 3  4. Pure  hypercholesterolemia Stable. Continue lovastatin 20mg . Will check labs as below and f/u pending results. - Lipid panel  5. Essential hypertension Elevated today. Change metoprolol tartrate 100mg  BID to metoprolol succinate XR 200mg  once daily. Continue losartan-hctz 100-25mg . I will see her back in 4 weeks to recheck.  - metoprolol (TOPROL-XL) 200 MG 24 hr tablet; Take 1 tablet (200 mg total) by mouth daily.  Dispense: 90 tablet; Refill: 1  6. Avitaminosis D H/O this. On OTC supplementation. Will check labs as below and f/u pending results. - Vitamin D (25 hydroxy)  7. Compulsive tobacco user syndrome Patient does not desire to quit at this time.   8. Influenza vaccination declined Wants to wait and get near the end of October per patient.   ------------------------------------------------------------------------------------------------------------    [name], PA-C  [place]

## 2018-08-30 NOTE — Patient Instructions (Signed)
Health Maintenance for Postmenopausal Women Menopause is a normal process in which your reproductive ability comes to an end. This process happens gradually over a span of months to years, usually between the ages of 22 and 9. Menopause is complete when you have missed 12 consecutive menstrual periods. It is important to talk with your health care provider about some of the most common conditions that affect postmenopausal women, such as heart disease, cancer, and bone loss (osteoporosis). Adopting a healthy lifestyle and getting preventive care can help to promote your health and wellness. Those actions can also lower your chances of developing some of these common conditions. What should I know about menopause? During menopause, you may experience a number of symptoms, such as:  Moderate-to-severe hot flashes.  Night sweats.  Decrease in sex drive.  Mood swings.  Headaches.  Tiredness.  Irritability.  Memory problems.  Insomnia.  Choosing to treat or not to treat menopausal changes is an individual decision that you make with your health care provider. What should I know about hormone replacement therapy and supplements? Hormone therapy products are effective for treating symptoms that are associated with menopause, such as hot flashes and night sweats. Hormone replacement carries certain risks, especially as you become older. If you are thinking about using estrogen or estrogen with progestin treatments, discuss the benefits and risks with your health care provider. What should I know about heart disease and stroke? Heart disease, heart attack, and stroke become more likely as you age. This may be due, in part, to the hormonal changes that your body experiences during menopause. These can affect how your body processes dietary fats, triglycerides, and cholesterol. Heart attack and stroke are both medical emergencies. There are many things that you can do to help prevent heart disease  and stroke:  Have your blood pressure checked at least every 1-2 years. High blood pressure causes heart disease and increases the risk of stroke.  If you are 53-22 years old, ask your health care provider if you should take aspirin to prevent a heart attack or a stroke.  Do not use any tobacco products, including cigarettes, chewing tobacco, or electronic cigarettes. If you need help quitting, ask your health care provider.  It is important to eat a healthy diet and maintain a healthy weight. ? Be sure to include plenty of vegetables, fruits, low-fat dairy products, and lean protein. ? Avoid eating foods that are high in solid fats, added sugars, or salt (sodium).  Get regular exercise. This is one of the most important things that you can do for your health. ? Try to exercise for at least 150 minutes each week. The type of exercise that you do should increase your heart rate and make you sweat. This is known as moderate-intensity exercise. ? Try to do strengthening exercises at least twice each week. Do these in addition to the moderate-intensity exercise.  Know your numbers.Ask your health care provider to check your cholesterol and your blood glucose. Continue to have your blood tested as directed by your health care provider.  What should I know about cancer screening? There are several types of cancer. Take the following steps to reduce your risk and to catch any cancer development as early as possible. Breast Cancer  Practice breast self-awareness. ? This means understanding how your breasts normally appear and feel. ? It also means doing regular breast self-exams. Let your health care provider know about any changes, no matter how small.  If you are 40  or older, have a clinician do a breast exam (clinical breast exam or CBE) every year. Depending on your age, family history, and medical history, it may be recommended that you also have a yearly breast X-ray (mammogram).  If you  have a family history of breast cancer, talk with your health care provider about genetic screening.  If you are at high risk for breast cancer, talk with your health care provider about having an MRI and a mammogram every year.  Breast cancer (BRCA) gene test is recommended for women who have family members with BRCA-related cancers. Results of the assessment will determine the need for genetic counseling and BRCA1 and for BRCA2 testing. BRCA-related cancers include these types: ? Breast. This occurs in males or females. ? Ovarian. ? Tubal. This may also be called fallopian tube cancer. ? Cancer of the abdominal or pelvic lining (peritoneal cancer). ? Prostate. ? Pancreatic.  Cervical, Uterine, and Ovarian Cancer Your health care provider may recommend that you be screened regularly for cancer of the pelvic organs. These include your ovaries, uterus, and vagina. This screening involves a pelvic exam, which includes checking for microscopic changes to the surface of your cervix (Pap test).  For women ages 21-65, health care providers may recommend a pelvic exam and a Pap test every three years. For women ages 79-65, they may recommend the Pap test and pelvic exam, combined with testing for human papilloma virus (HPV), every five years. Some types of HPV increase your risk of cervical cancer. Testing for HPV may also be done on women of any age who have unclear Pap test results.  Other health care providers may not recommend any screening for nonpregnant women who are considered low risk for pelvic cancer and have no symptoms. Ask your health care provider if a screening pelvic exam is right for you.  If you have had past treatment for cervical cancer or a condition that could lead to cancer, you need Pap tests and screening for cancer for at least 20 years after your treatment. If Pap tests have been discontinued for you, your risk factors (such as having a new sexual partner) need to be  reassessed to determine if you should start having screenings again. Some women have medical problems that increase the chance of getting cervical cancer. In these cases, your health care provider may recommend that you have screening and Pap tests more often.  If you have a family history of uterine cancer or ovarian cancer, talk with your health care provider about genetic screening.  If you have vaginal bleeding after reaching menopause, tell your health care provider.  There are currently no reliable tests available to screen for ovarian cancer.  Lung Cancer Lung cancer screening is recommended for adults 69-62 years old who are at high risk for lung cancer because of a history of smoking. A yearly low-dose CT scan of the lungs is recommended if you:  Currently smoke.  Have a history of at least 30 pack-years of smoking and you currently smoke or have quit within the past 15 years. A pack-year is smoking an average of one pack of cigarettes per day for one year.  Yearly screening should:  Continue until it has been 15 years since you quit.  Stop if you develop a health problem that would prevent you from having lung cancer treatment.  Colorectal Cancer  This type of cancer can be detected and can often be prevented.  Routine colorectal cancer screening usually begins at  age 42 and continues through age 45.  If you have risk factors for colon cancer, your health care provider may recommend that you be screened at an earlier age.  If you have a family history of colorectal cancer, talk with your health care provider about genetic screening.  Your health care provider may also recommend using home test kits to check for hidden blood in your stool.  A small camera at the end of a tube can be used to examine your colon directly (sigmoidoscopy or colonoscopy). This is done to check for the earliest forms of colorectal cancer.  Direct examination of the colon should be repeated every  5-10 years until age 71. However, if early forms of precancerous polyps or small growths are found or if you have a family history or genetic risk for colorectal cancer, you may need to be screened more often.  Skin Cancer  Check your skin from head to toe regularly.  Monitor any moles. Be sure to tell your health care provider: ? About any new moles or changes in moles, especially if there is a change in a mole's shape or color. ? If you have a mole that is larger than the size of a pencil eraser.  If any of your family members has a history of skin cancer, especially at a young age, talk with your health care provider about genetic screening.  Always use sunscreen. Apply sunscreen liberally and repeatedly throughout the day.  Whenever you are outside, protect yourself by wearing long sleeves, pants, a wide-brimmed hat, and sunglasses.  What should I know about osteoporosis? Osteoporosis is a condition in which bone destruction happens more quickly than new bone creation. After menopause, you may be at an increased risk for osteoporosis. To help prevent osteoporosis or the bone fractures that can happen because of osteoporosis, the following is recommended:  If you are 46-71 years old, get at least 1,000 mg of calcium and at least 600 mg of vitamin D per day.  If you are older than age 55 but younger than age 65, get at least 1,200 mg of calcium and at least 600 mg of vitamin D per day.  If you are older than age 54, get at least 1,200 mg of calcium and at least 800 mg of vitamin D per day.  Smoking and excessive alcohol intake increase the risk of osteoporosis. Eat foods that are rich in calcium and vitamin D, and do weight-bearing exercises several times each week as directed by your health care provider. What should I know about how menopause affects my mental health? Depression may occur at any age, but it is more common as you become older. Common symptoms of depression  include:  Low or sad mood.  Changes in sleep patterns.  Changes in appetite or eating patterns.  Feeling an overall lack of motivation or enjoyment of activities that you previously enjoyed.  Frequent crying spells.  Talk with your health care provider if you think that you are experiencing depression. What should I know about immunizations? It is important that you get and maintain your immunizations. These include:  Tetanus, diphtheria, and pertussis (Tdap) booster vaccine.  Influenza every year before the flu season begins.  Pneumonia vaccine.  Shingles vaccine.  Your health care provider may also recommend other immunizations. This information is not intended to replace advice given to you by your health care provider. Make sure you discuss any questions you have with your health care provider. Document Released: 01/27/2006  Document Revised: 06/24/2016 Document Reviewed: 09/08/2015 Elsevier Interactive Patient Education  2018 Elsevier Inc.  

## 2018-09-18 DIAGNOSIS — E7800 Pure hypercholesterolemia, unspecified: Secondary | ICD-10-CM | POA: Diagnosis not present

## 2018-09-18 DIAGNOSIS — Z0000 Encounter for general adult medical examination without abnormal findings: Secondary | ICD-10-CM | POA: Diagnosis not present

## 2018-09-18 DIAGNOSIS — E039 Hypothyroidism, unspecified: Secondary | ICD-10-CM | POA: Diagnosis not present

## 2018-09-18 DIAGNOSIS — E1129 Type 2 diabetes mellitus with other diabetic kidney complication: Secondary | ICD-10-CM | POA: Diagnosis not present

## 2018-09-18 DIAGNOSIS — E559 Vitamin D deficiency, unspecified: Secondary | ICD-10-CM | POA: Diagnosis not present

## 2018-09-19 LAB — CBC WITH DIFFERENTIAL/PLATELET
BASOS: 1 %
Basophils Absolute: 0 10*3/uL (ref 0.0–0.2)
EOS (ABSOLUTE): 0.2 10*3/uL (ref 0.0–0.4)
Eos: 4 %
HEMOGLOBIN: 15.2 g/dL (ref 11.1–15.9)
Hematocrit: 43.5 % (ref 34.0–46.6)
Immature Grans (Abs): 0 10*3/uL (ref 0.0–0.1)
Immature Granulocytes: 1 %
LYMPHS ABS: 2.2 10*3/uL (ref 0.7–3.1)
Lymphs: 33 %
MCH: 32.7 pg (ref 26.6–33.0)
MCHC: 34.9 g/dL (ref 31.5–35.7)
MCV: 94 fL (ref 79–97)
MONOCYTES: 7 %
Monocytes Absolute: 0.5 10*3/uL (ref 0.1–0.9)
NEUTROS ABS: 3.6 10*3/uL (ref 1.4–7.0)
Neutrophils: 54 %
Platelets: 241 10*3/uL (ref 150–450)
RBC: 4.65 x10E6/uL (ref 3.77–5.28)
RDW: 13.9 % (ref 12.3–15.4)
WBC: 6.6 10*3/uL (ref 3.4–10.8)

## 2018-09-19 LAB — LIPID PANEL
CHOL/HDL RATIO: 4.7 ratio — AB (ref 0.0–4.4)
Cholesterol, Total: 165 mg/dL (ref 100–199)
HDL: 35 mg/dL — AB (ref >39)
LDL CALC: 74 mg/dL (ref 0–99)
Triglycerides: 279 mg/dL — ABNORMAL HIGH (ref 0–149)
VLDL Cholesterol Cal: 56 mg/dL — ABNORMAL HIGH (ref 5–40)

## 2018-09-19 LAB — HEMOGLOBIN A1C
Est. average glucose Bld gHb Est-mCnc: 148 mg/dL
HEMOGLOBIN A1C: 6.8 % — AB (ref 4.8–5.6)

## 2018-09-19 LAB — COMPREHENSIVE METABOLIC PANEL
ALBUMIN: 4.4 g/dL (ref 3.6–4.8)
ALK PHOS: 54 IU/L (ref 39–117)
ALT: 58 IU/L — ABNORMAL HIGH (ref 0–32)
AST: 51 IU/L — ABNORMAL HIGH (ref 0–40)
Albumin/Globulin Ratio: 1.5 (ref 1.2–2.2)
BUN/Creatinine Ratio: 14 (ref 12–28)
BUN: 10 mg/dL (ref 8–27)
Bilirubin Total: 0.2 mg/dL (ref 0.0–1.2)
CO2: 27 mmol/L (ref 20–29)
CREATININE: 0.69 mg/dL (ref 0.57–1.00)
Calcium: 9.7 mg/dL (ref 8.7–10.3)
Chloride: 95 mmol/L — ABNORMAL LOW (ref 96–106)
GFR calc Af Amer: 105 mL/min/{1.73_m2} (ref >59)
GFR calc non Af Amer: 91 mL/min/{1.73_m2} (ref >59)
GLOBULIN, TOTAL: 3 g/dL (ref 1.5–4.5)
Glucose: 114 mg/dL — ABNORMAL HIGH (ref 65–99)
Potassium: 4.6 mmol/L (ref 3.5–5.2)
Sodium: 138 mmol/L (ref 134–144)
Total Protein: 7.4 g/dL (ref 6.0–8.5)

## 2018-09-19 LAB — VITAMIN D 25 HYDROXY (VIT D DEFICIENCY, FRACTURES): VIT D 25 HYDROXY: 48.2 ng/mL (ref 30.0–100.0)

## 2018-09-19 LAB — TSH: TSH: 3.34 u[IU]/mL (ref 0.450–4.500)

## 2018-09-25 ENCOUNTER — Encounter: Payer: Self-pay | Admitting: Physician Assistant

## 2018-09-25 DIAGNOSIS — I10 Essential (primary) hypertension: Secondary | ICD-10-CM

## 2018-09-26 MED ORDER — OLMESARTAN-AMLODIPINE-HCTZ 40-5-25 MG PO TABS: 1.0000 | ORAL_TABLET | Freq: Every day | ORAL | 3 refills | Status: DC

## 2018-10-04 ENCOUNTER — Encounter: Payer: Self-pay | Admitting: Physician Assistant

## 2018-10-04 ENCOUNTER — Ambulatory Visit (INDEPENDENT_AMBULATORY_CARE_PROVIDER_SITE_OTHER): Payer: PPO | Admitting: Physician Assistant

## 2018-10-04 VITALS — BP 160/90 | HR 65 | Temp 98.1°F | Resp 16 | Wt 182.8 lb

## 2018-10-04 DIAGNOSIS — Z23 Encounter for immunization: Secondary | ICD-10-CM | POA: Diagnosis not present

## 2018-10-04 DIAGNOSIS — I10 Essential (primary) hypertension: Secondary | ICD-10-CM | POA: Diagnosis not present

## 2018-10-04 NOTE — Progress Notes (Signed)
Patient: Heather Burke Female    DOB: September 04, 1951   67 y.o.   MRN: [id number] Visit Date: 10/04/2018  Today's Provider: [name], PA-C   Chief Complaint  Patient presents with  . Follow-up    HTN   Subjective:    HPI  Hypertension, follow-up:  BP Readings from Last 3 Encounters:  10/04/18 (!) 160/90  08/30/18 (!) 170/100  08/23/18 (!) 186/88    She was last seen for hypertension 4 weeks ago.  BP at that visit was 170/100.  Management since that visit includes Change Metoprolol tartrate 100mg  BID to metoprolol succinate XR 200mg  once daily. Continue losartan-hctz 100-25mg . On 10/08 Olmesartan-HCTZ was sent in for patient because Losartan-HCTZ had been recalled. She reports excellent compliance with treatment. She is not having side effects.  She is not exercising. She is not adherent to low salt diet.   Outside blood pressures are 130's/80's in the AM.On 10/13 164/65 pm, 10/14 138/80 am, 10/15 145/68 afternoon, 10/16 180/87 midnight and this morning it was 139/78. She is experiencing none.  Patient denies chest pain, chest pressure/discomfort, exertional chest pressure/discomfort, fatigue, irregular heart beat, lower extremity edema, near-syncope and palpitations.   Cardiovascular risk factors include advanced age (older than 48 for men, 30 for women), diabetes mellitus, dyslipidemia, hypertension, obesity (BMI >= 30 kg/m2), sedentary lifestyle and smoking/ tobacco exposure.    Weight trend: stable Wt Readings from Last 3 Encounters:  10/04/18 182 lb 12.8 oz (82.9 kg)  08/30/18 183 lb 9.6 oz (83.3 kg)  08/23/18 184 lb 12.8 oz (83.8 kg)    Current diet: in general, an "unhealthy" diet ------------------------------------------------------------------------     Allergies  Allergen Reactions  . Amlodipine Nausea Only and Other (See Comments)    Stomach cramping  . Atorvastatin Nausea And Vomiting  . Lisinopril Cough  . Pravastatin Nausea And  Vomiting     Current Outpatient Medications:  .  aspirin 81 MG tablet, Take 81 mg by mouth daily., Disp: , Rfl:  .  calcium carbonate (OS-CAL) 600 MG TABS tablet, Take 1 tablet (600 mg total) by mouth 2 (two) times daily with a meal., Disp: 60 tablet, Rfl:  .  Cholecalciferol (D3 ADULT PO), Take 1 tablet by mouth daily. 1000 units daily, Disp: , Rfl:  .  FIBER PO, Take by mouth daily. , Disp: , Rfl:  .  fluticasone (FLONASE) 50 MCG/ACT nasal spray, SPRAY 2 SPRAYS INTO EACH NOSTRIL EVERY DAY, Disp: 48 g, Rfl: 1 .  KLOR-CON M10 10 MEQ tablet, TAKE 1 TABLET BY MOUTH EVERY DAY, Disp: 90 tablet, Rfl: 1 .  levothyroxine (SYNTHROID, LEVOTHROID) 88 MCG tablet, Take 1 tablet (88 mcg total) by mouth daily., Disp: 90 tablet, Rfl: 3 .  lovastatin (MEVACOR) 20 MG tablet, TAKE 1 TABLET (20 MG TOTAL) BY MOUTH AT BEDTIME., Disp: 90 tablet, Rfl: 1 .  metFORMIN (GLUCOPHAGE) 500 MG tablet, TAKE 1 TABLET (500 MG TOTAL) BY MOUTH 2 (TWO) TIMES DAILY WITH A MEAL., Disp: 180 tablet, Rfl: 1 .  Olmesartan-amLODIPine-HCTZ 40-5-25 MG TABS, Take 1 tablet by mouth daily., Disp: 30 tablet, Rfl: 3 .  Omega-3 Fatty Acids (FISH OIL) 1200 MG CAPS, Take by mouth daily. 1200 mg TID, Disp: , Rfl:  .  metoprolol (TOPROL-XL) 200 MG 24 hr tablet, Take 1 tablet (200 mg total) by mouth daily. (Patient not taking: Reported on 10/04/2018), Disp: 90 tablet, Rfl: 1  Review of Systems  Constitutional: Negative.   Respiratory: Negative.  Cardiovascular: Negative.   Neurological: Negative.   Psychiatric/Behavioral: Negative.     Social History   Tobacco Use  . Smoking status: Current Every Day Smoker    Packs/day: 2.00    Years: 49.00    Pack years: 98.00    Types: Cigarettes  . Smokeless tobacco: Never Used  Substance Use Topics  . Alcohol use: Yes    Alcohol/week: 14.0 - 28.0 standard drinks    Types: 14 - 28 Cans of beer per week   Objective:   BP (!) 160/90 (BP Location: Right Arm, Patient Position: Sitting, Cuff Size:  Normal)   Pulse 65   Temp 98.1 F (36.7 C) (Oral)   Resp 16   Wt 182 lb 12.8 oz (82.9 kg)   SpO2 97%   BMI 32.38 kg/m  Vitals:   10/04/18 0941  BP: (!) 160/90  Pulse: 65  Resp: 16  Temp: 98.1 F (36.7 C)  TempSrc: Oral  SpO2: 97%  Weight: 182 lb 12.8 oz (82.9 kg)     Physical Exam  Constitutional: She appears well-developed and well-nourished. No distress.  Neck: Normal range of motion. Neck supple.  Cardiovascular: Normal rate, regular rhythm and normal heart sounds. Exam reveals no gallop and no friction rub.  No murmur heard. Pulmonary/Chest: Effort normal and breath sounds normal. No respiratory distress. She has no wheezes. She has no rales.  Skin: She is not diaphoretic.  Vitals reviewed.      Assessment & Plan:     1. Essential hypertension Still borderline elevated. Will continue metoprolol XL 200mg  daily and olmesartan-amlodipine-hctz 40-5-25mg . She is to check BP over the next month and send mychart message with BP readings. If still elevated will increase olmesartan-amlodipine-hctz to twice daily.   2. Need for influenza vaccination Flu vaccine given today without complication. Patient sat upright for 15 minutes to check for adverse reaction before being released. - Flu vaccine HIGH DOSE PF       [name], PA-C  [facility]

## 2018-10-23 ENCOUNTER — Encounter: Payer: Self-pay | Admitting: Physician Assistant

## 2018-12-10 ENCOUNTER — Other Ambulatory Visit: Payer: Self-pay | Admitting: Physician Assistant

## 2018-12-10 DIAGNOSIS — E785 Hyperlipidemia, unspecified: Secondary | ICD-10-CM

## 2018-12-10 DIAGNOSIS — J302 Other seasonal allergic rhinitis: Secondary | ICD-10-CM

## 2018-12-10 DIAGNOSIS — E1129 Type 2 diabetes mellitus with other diabetic kidney complication: Secondary | ICD-10-CM

## 2018-12-24 ENCOUNTER — Other Ambulatory Visit: Payer: Self-pay | Admitting: Physician Assistant

## 2018-12-24 DIAGNOSIS — I10 Essential (primary) hypertension: Secondary | ICD-10-CM

## 2019-01-24 ENCOUNTER — Other Ambulatory Visit: Payer: Self-pay | Admitting: Physician Assistant

## 2019-01-24 DIAGNOSIS — E876 Hypokalemia: Secondary | ICD-10-CM

## 2019-01-30 ENCOUNTER — Other Ambulatory Visit: Payer: Self-pay | Admitting: Physician Assistant

## 2019-01-30 DIAGNOSIS — E876 Hypokalemia: Secondary | ICD-10-CM

## 2019-02-04 ENCOUNTER — Telehealth: Payer: Self-pay | Admitting: Physician Assistant

## 2019-02-04 NOTE — Telephone Encounter (Signed)
April for BP

## 2019-02-04 NOTE — Telephone Encounter (Signed)
When is Heather Burke due for a f/u visit with Tawanna Sat?

## 2019-02-04 NOTE — Telephone Encounter (Signed)
Patient scheduled.

## 2019-02-04 NOTE — Telephone Encounter (Signed)
Please advise when patient will be due for a follow up office visit. Thanks.

## 2019-02-04 NOTE — Telephone Encounter (Signed)
LVMTRC 

## 2019-02-21 ENCOUNTER — Telehealth: Payer: Self-pay

## 2019-02-21 DIAGNOSIS — I10 Essential (primary) hypertension: Secondary | ICD-10-CM

## 2019-02-21 MED ORDER — LOSARTAN POTASSIUM-HCTZ 100-25 MG PO TABS: 1.0000 | ORAL_TABLET | Freq: Every day | ORAL | 3 refills | Status: DC

## 2019-02-21 NOTE — Telephone Encounter (Signed)
Changed.

## 2019-02-21 NOTE — Telephone Encounter (Signed)
Patient was switched from Losartan/HCTZ to Olmesartan/amlod/HCTZ when the Losartan was back ordered.  She feels she is not doing as well on the new medicine and would like to go back now that it is no longer back ordered.  Patient uses [facility]. Her old dose was Losartan HCTZ 100/25

## 2019-02-25 ENCOUNTER — Other Ambulatory Visit: Payer: Self-pay | Admitting: Physician Assistant

## 2019-02-25 DIAGNOSIS — I10 Essential (primary) hypertension: Secondary | ICD-10-CM

## 2019-03-25 ENCOUNTER — Ambulatory Visit: Payer: Self-pay | Admitting: Physician Assistant

## 2019-03-26 ENCOUNTER — Ambulatory Visit: Payer: Self-pay | Admitting: Physician Assistant

## 2019-03-26 ENCOUNTER — Ambulatory Visit (INDEPENDENT_AMBULATORY_CARE_PROVIDER_SITE_OTHER): Payer: PPO | Admitting: Physician Assistant

## 2019-03-26 ENCOUNTER — Encounter: Payer: Self-pay | Admitting: Physician Assistant

## 2019-03-26 VITALS — BP 158/84 | HR 73

## 2019-03-26 DIAGNOSIS — I10 Essential (primary) hypertension: Secondary | ICD-10-CM

## 2019-03-26 MED ORDER — NIFEDIPINE ER 30 MG PO TB24: 30.0000 mg | ORAL_TABLET | Freq: Every day | ORAL | 1 refills | Status: DC

## 2019-03-26 NOTE — Progress Notes (Signed)
Virtual Visit via Video Note  I connected with Heather Burke on 03/27/19 at  2:00 PM EDT by a video enabled telemedicine application and verified that I am speaking with the correct person using two identifiers.   I discussed the limitations of evaluation and management by telemedicine and the availability of in person appointments. The patient expressed understanding and agreed to proceed.   [name], PA-C    Patient: Heather Burke Female    DOB: May 27, 1951   68 y.o.   MRN: [id number] Visit Date: 03/26/2019  Today's Provider: [name], PA-C   Chief Complaint  Patient presents with  . Follow-up  . Hypertension   Subjective:     HPI Heather Burke is a 68 yr old female that presents for HTN f/u. A few months ago she came to the office with elevated BP. She had been on losartan-hctz 100-25mg  and metoprolol XR 200mg  daily. The losartan-HCTZ went on backorder and she was changed to olmesartan-amlodipine-hctz. She did not tolerate this medication and was switched back to losartan-hctz as it came back on the market. She is following up in a video visit for HTN. She has checked her BP at home today and this is documented under vitals. Allergies  Allergen Reactions  . Amlodipine Nausea Only and Other (See Comments)    Stomach cramping  . Atorvastatin Nausea And Vomiting  . Lisinopril Cough  . Pravastatin Nausea And Vomiting     Current Outpatient Medications:  .  aspirin 81 MG tablet, Take 81 mg by mouth daily., Disp: , Rfl:  .  calcium carbonate (OS-CAL) 600 MG TABS tablet, Take 1 tablet (600 mg total) by mouth 2 (two) times daily with a meal., Disp: 60 tablet, Rfl:  .  Cholecalciferol (D3 ADULT PO), Take 1 tablet by mouth daily. 1000 units daily, Disp: , Rfl:  .  FIBER PO, Take by mouth daily. , Disp: , Rfl:  .  fluticasone (FLONASE) 50 MCG/ACT nasal spray, SPRAY 2 SPRAYS INTO EACH NOSTRIL EVERY DAY, Disp: 48 g, Rfl: 1 .  KLOR-CON M10 10 MEQ tablet,  TAKE 1 TABLET BY MOUTH EVERY DAY, Disp: 90 tablet, Rfl: 1 .  levothyroxine (SYNTHROID, LEVOTHROID) 88 MCG tablet, Take 1 tablet (88 mcg total) by mouth daily., Disp: 90 tablet, Rfl: 3 .  losartan-hydrochlorothiazide (HYZAAR) 100-25 MG tablet, Take 1 tablet by mouth daily., Disp: 90 tablet, Rfl: 3 .  lovastatin (MEVACOR) 20 MG tablet, TAKE 1 TABLET (20 MG TOTAL) BY MOUTH AT BEDTIME., Disp: 90 tablet, Rfl: 1 .  metFORMIN (GLUCOPHAGE) 500 MG tablet, TAKE 1 TABLET (500 MG TOTAL) BY MOUTH 2 (TWO) TIMES DAILY WITH A MEAL., Disp: 180 tablet, Rfl: 1 .  metoprolol (TOPROL-XL) 200 MG 24 hr tablet, TAKE 1 TABLET BY MOUTH EVERY DAY, Disp: 90 tablet, Rfl: 1 .  Omega-3 Fatty Acids (FISH OIL) 1200 MG CAPS, Take by mouth daily. 1200 mg TID, Disp: , Rfl:  .  penicillin v potassium (VEETID) 250 MG tablet, , Disp: , Rfl:   Review of Systems  Constitutional: Negative.   Eyes: Negative for visual disturbance.  Respiratory: Negative.   Cardiovascular: Negative.   Neurological: Negative.     Social History   Tobacco Use  . Smoking status: Current Every Day Smoker    Packs/day: 2.00    Years: 49.00    Pack years: 98.00    Types: Cigarettes  . Smokeless tobacco: Never Used  Substance Use Topics  . Alcohol use: Yes  Alcohol/week: 14.0 - 28.0 standard drinks    Types: 14 - 28 Cans of beer per week      Objective:   BP (!) 158/84   Pulse 73  Vitals:   03/26/19 1353  BP: (!) 158/84  Pulse: 73     Physical Exam Vitals signs reviewed.  Constitutional:      General: She is not in acute distress.    Appearance: Normal appearance. She is well-developed. She is not ill-appearing.  HENT:     Head: Normocephalic and atraumatic.  Neck:     Musculoskeletal: Normal range of motion and neck supple.  Pulmonary:     Effort: Pulmonary effort is normal. No respiratory distress.  Neurological:     Mental Status: She is alert.  Psychiatric:        Mood and Affect: Mood normal.        Behavior: Behavior  normal.        Thought Content: Thought content normal.        Judgment: Judgment normal.         Assessment & Plan    1. Essential hypertension Elevated still. Will add Nifedipine CR 30mg  as below. Continue losartan-hctz 100-25mg  and metoprolol xr 200mg  daily. I will f/u in 1-3 months. Call if symptoms worsen. - NIFEdipine (ADALAT CC) 30 MG 24 hr tablet; Take 1 tablet (30 mg total) by mouth daily.  Dispense: 90 tablet; Refill: 1    I discussed the assessment and treatment plan with the patient. The patient was provided an opportunity to ask questions and all were answered. The patient agreed with the plan and demonstrated an understanding of the instructions.   The patient was advised to call back or seek an in-person evaluation if the symptoms worsen or if the condition fails to improve as anticipated.  I provided 23 minutes of non-face-to-face time during this encounter.  [name], PA-C  [facility]

## 2019-03-27 ENCOUNTER — Encounter: Payer: Self-pay | Admitting: Physician Assistant

## 2019-04-01 ENCOUNTER — Ambulatory Visit: Payer: Self-pay | Admitting: Physician Assistant

## 2019-04-19 ENCOUNTER — Encounter: Payer: Self-pay | Admitting: Physician Assistant

## 2019-04-19 DIAGNOSIS — I10 Essential (primary) hypertension: Secondary | ICD-10-CM

## 2019-04-19 MED ORDER — DILTIAZEM HCL ER 60 MG PO CP12: 60.0000 mg | ORAL_CAPSULE | Freq: Two times a day (BID) | ORAL | 1 refills | Status: DC

## 2019-04-27 ENCOUNTER — Encounter: Payer: Self-pay | Admitting: Physician Assistant

## 2019-04-27 DIAGNOSIS — I10 Essential (primary) hypertension: Secondary | ICD-10-CM

## 2019-04-29 MED ORDER — DILTIAZEM HCL ER 60 MG PO CP12: 60.0000 mg | ORAL_CAPSULE | Freq: Two times a day (BID) | ORAL | 1 refills | Status: DC

## 2019-05-31 ENCOUNTER — Other Ambulatory Visit: Payer: Self-pay | Admitting: Physician Assistant

## 2019-05-31 DIAGNOSIS — J302 Other seasonal allergic rhinitis: Secondary | ICD-10-CM

## 2019-05-31 DIAGNOSIS — E1129 Type 2 diabetes mellitus with other diabetic kidney complication: Secondary | ICD-10-CM

## 2019-05-31 DIAGNOSIS — E785 Hyperlipidemia, unspecified: Secondary | ICD-10-CM

## 2019-06-19 DIAGNOSIS — E113393 Type 2 diabetes mellitus with moderate nonproliferative diabetic retinopathy without macular edema, bilateral: Secondary | ICD-10-CM | POA: Diagnosis not present

## 2019-06-20 ENCOUNTER — Other Ambulatory Visit: Payer: Self-pay | Admitting: Physician Assistant

## 2019-06-20 DIAGNOSIS — I10 Essential (primary) hypertension: Secondary | ICD-10-CM

## 2019-07-18 ENCOUNTER — Telehealth: Payer: Self-pay | Admitting: *Deleted

## 2019-07-18 NOTE — Telephone Encounter (Signed)
Patient would like a return call regarding the CT screening in a couple of months once the virus restrictions are better.

## 2019-07-30 ENCOUNTER — Other Ambulatory Visit: Payer: Self-pay | Admitting: Physician Assistant

## 2019-07-30 DIAGNOSIS — E039 Hypothyroidism, unspecified: Secondary | ICD-10-CM

## 2019-07-30 DIAGNOSIS — I10 Essential (primary) hypertension: Secondary | ICD-10-CM

## 2019-07-30 DIAGNOSIS — E876 Hypokalemia: Secondary | ICD-10-CM

## 2019-07-31 ENCOUNTER — Telehealth: Payer: Self-pay | Admitting: Physician Assistant

## 2019-09-02 NOTE — Progress Notes (Signed)
Patient: Heather Burke Female    DOB: 1951/07/21   68 y.o.   MRN: [id number] Visit Date: 09/03/2019  Today's Provider: [name], PA-C   Chief Complaint  Patient presents with   Follow-up   Diabetes   Hypertension   Subjective:     HPI   Hypertension, follow-up:  BP Readings from Last 3 Encounters:  09/03/19 (!) 183/83  03/26/19 (!) 158/84  10/04/18 (!) 160/90    She was last seen for hypertension 5 months ago.  BP at that visit was 158/84 Management since that visit includes add Nifedipine CR 30mg ( patient took this medicine for a 1 month and was having side effect (headache daily).Continue losartan-hctz 100-25mg  and metoprolol xr 200mg  daily. Diltiazem 60 mg 2 times daily was sent in on 04/27/2019.  She reports good compliance with treatment. She is not having side effects. none She is not exercising. She is not adherent to low salt diet.   Outside blood pressures are none. She is experiencing none.  Patient denies none.   Cardiovascular risk factors include diabetes mellitus and hypertension.  Use of agents associated with hypertension: none.     Weight trend: stable Wt Readings from Last 3 Encounters:  09/03/19 189 lb (85.7 kg)  10/04/18 182 lb 12.8 oz (82.9 kg)  08/30/18 183 lb 9.6 oz (83.3 kg)    Current diet: well balanced  -----------------------------------------------------------   Diabetes Mellitus Type II, Follow-up:   Lab Results  Component Value Date   HGBA1C 7.5 (A) 09/03/2019   HGBA1C 6.8 (H) 09/18/2018   HGBA1C 6.5 (H) 01/16/2018    Last seen for diabetes 5 months ago.  Management since then includes recommend to restart metformin 500mg  at least once daily for better control. She reports good compliance with treatment. She is not having side effects. none Current symptoms include none and have been unchanged. Home blood sugar records: fasting range: not checking  Episodes of hypoglycemia? no   Current  insulin regiment: n/a Most Recent Eye Exam:  Weight trend: stable Prior visit with dietician: No Current exercise: none Current diet habits: well balanced  Pertinent Labs:    Component Value Date/Time   CHOL 165 09/18/2018 0912   TRIG 279 (H) 09/18/2018 0912   HDL 35 (L) 09/18/2018 0912   LDLCALC 74 09/18/2018 0912   CREATININE 0.69 09/18/2018 0912    Wt Readings from Last 3 Encounters:  09/03/19 189 lb (85.7 kg)  10/04/18 182 lb 12.8 oz (82.9 kg)  08/30/18 183 lb 9.6 oz (83.3 kg)    -----------------------------------------------------------   Allergies  Allergen Reactions   Amlodipine Nausea Only and Other (See Comments)    Stomach cramping   Atorvastatin Nausea And Vomiting   Lisinopril Cough   Pravastatin Nausea And Vomiting   Nifedipine Er Other (See Comments)    headache     Current Outpatient Medications:    aspirin 81 MG tablet, Take 81 mg by mouth daily., Disp: , Rfl:    calcium carbonate (OS-CAL) 600 MG TABS tablet, Take 1 tablet (600 mg total) by mouth 2 (two) times daily with a meal., Disp: 60 tablet, Rfl:    Cholecalciferol (D3 ADULT PO), Take 1 tablet by mouth daily. 1000 units daily, Disp: , Rfl:    diltiazem (CARDIZEM SR) 60 MG 12 hr capsule, TAKE 1 CAPSULE BY MOUTH 2 TIMES DAILY., Disp: 180 capsule, Rfl: 1   FIBER PO, Take by mouth daily. , Disp: , Rfl:  fluticasone (FLONASE) 50 MCG/ACT nasal spray, SPRAY 2 SPRAYS INTO EACH NOSTRIL EVERY DAY, Disp: 48 g, Rfl: 1   KLOR-CON M10 10 MEQ tablet, TAKE 1 TABLET BY MOUTH EVERY DAY, Disp: 90 tablet, Rfl: 1   levothyroxine (SYNTHROID) 88 MCG tablet, TAKE 1 TABLET BY MOUTH EVERY DAY, Disp: 90 tablet, Rfl: 1   losartan-hydrochlorothiazide (HYZAAR) 100-25 MG tablet, Take 1 tablet by mouth daily., Disp: 90 tablet, Rfl: 3   lovastatin (MEVACOR) 20 MG tablet, TAKE 1 TABLET BY MOUTH EVERYDAY AT BEDTIME, Disp: 90 tablet, Rfl: 1   metFORMIN (GLUCOPHAGE) 500 MG tablet, TAKE 1 TABLET (500 MG TOTAL) BY  MOUTH 2 (TWO) TIMES DAILY WITH A MEAL., Disp: 180 tablet, Rfl: 1   metoprolol (TOPROL-XL) 200 MG 24 hr tablet, TAKE 1 TABLET BY MOUTH EVERY DAY, Disp: 90 tablet, Rfl: 1   Omega-3 Fatty Acids (FISH OIL) 1200 MG CAPS, Take by mouth daily. 1200 mg TID, Disp: , Rfl:    penicillin v potassium (VEETID) 250 MG tablet, , Disp: , Rfl:   Review of Systems  Constitutional: Positive for fatigue. Negative for appetite change, chills and fever.  Respiratory: Negative for chest tightness and shortness of breath.   Cardiovascular: Negative for chest pain and palpitations.  Gastrointestinal: Negative for abdominal pain, nausea and vomiting.  Endocrine: Negative for polydipsia, polyphagia and polyuria.  Neurological: Negative for dizziness and weakness.    Social History   Tobacco Use   Smoking status: Current Every Day Smoker    Packs/day: 2.00    Years: 49.00    Pack years: 98.00    Types: Cigarettes   Smokeless tobacco: Never Used  Substance Use Topics   Alcohol use: Yes    Alcohol/week: 14.0 - 28.0 standard drinks    Types: 14 - 28 Cans of beer per week      Objective:   BP (!) 183/83 (BP Location: Right Arm, Patient Position: Sitting, Cuff Size: Large)    Pulse 72    Temp (!) 96.9 F (36.1 C) (Other (Comment))    Resp 16    Ht 5\' 3"  (1.6 m)    Wt 189 lb (85.7 kg)    SpO2 95%    BMI 33.48 kg/m  Vitals:   09/03/19 1423  BP: (!) 183/83  Pulse: 72  Resp: 16  Temp: (!) 96.9 F (36.1 C)  TempSrc: Other (Comment)  SpO2: 95%  Weight: 189 lb (85.7 kg)  Height: 5\' 3"  (1.6 m)  Body mass index is 33.48 kg/m.   Physical Exam Vitals signs reviewed.  Constitutional:      General: She is not in acute distress.    Appearance: Normal appearance. She is well-developed. She is obese. She is not ill-appearing or diaphoretic.  Neck:     Musculoskeletal: Normal range of motion and neck supple.     Thyroid: No thyromegaly.     Vascular: No JVD.     Trachea: No tracheal deviation.    Cardiovascular:     Rate and Rhythm: Normal rate and regular rhythm.     Heart sounds: Normal heart sounds. No murmur. No friction rub. No gallop.   Pulmonary:     Effort: Pulmonary effort is normal. No respiratory distress.     Breath sounds: Normal breath sounds. No wheezing or rales.  Lymphadenopathy:     Cervical: No cervical adenopathy.  Neurological:     Mental Status: She is alert.  Psychiatric:        Mood and Affect: Mood normal.  Behavior: Behavior normal.        Thought Content: Thought content normal.        Judgment: Judgment normal.     Results for orders placed or performed in visit on 09/03/19  POCT glycosylated hemoglobin (Hb A1C)  Result Value Ref Range   Hemoglobin A1C 7.5 (A) 4.0 - 5.6 %   Est. average glucose Bld gHb Est-mCnc 169        Assessment & Plan    1. Type 2 diabetes mellitus with other diabetic kidney complication (HCC) Elevated. Increase metformin to 1000mg  BID as below. I will see her back in 3 months for CPE. - metFORMIN (GLUCOPHAGE) 1000 MG tablet; Take 1 tablet (1,000 mg total) by mouth 2 (two) times daily with a meal.  Dispense: 60 tablet; Refill: 0  2. Essential hypertension Elevated still. Increase Diltiazem to 120mg  from 60mg  as below. I will see her back in 3 months.  - diltiazem (CARDIZEM SR) 120 MG 12 hr capsule; Take 1 capsule (120 mg total) by mouth 2 (two) times daily.  Dispense: 180 capsule; Refill: 1  3. Malignant neoplasm of left breast, stage 1, estrogen receptor positive ([place]) Followed by Dr. Bary Castilla. Gave his new office information to her today to call and schedule. Will order mammogram for her as it was past due in July.  - MS DIGITAL DIAG TOMO UNI RIGHT; Future  *of note we also discussed sleep study for fatigue but patient desires to hold off for now and will call if she desires before being seen in 3 months.    [name], PA-C  [facility]

## 2019-09-03 ENCOUNTER — Other Ambulatory Visit: Payer: Self-pay

## 2019-09-03 ENCOUNTER — Encounter: Payer: Self-pay | Admitting: Physician Assistant

## 2019-09-03 ENCOUNTER — Ambulatory Visit (INDEPENDENT_AMBULATORY_CARE_PROVIDER_SITE_OTHER): Payer: PPO | Admitting: Physician Assistant

## 2019-09-03 VITALS — BP 183/83 | HR 72 | Temp 96.9°F | Resp 16 | Ht 63.0 in | Wt 189.0 lb

## 2019-09-03 DIAGNOSIS — I10 Essential (primary) hypertension: Secondary | ICD-10-CM | POA: Diagnosis not present

## 2019-09-03 DIAGNOSIS — Z170 Estrogen receptor positive status [ER+]: Secondary | ICD-10-CM | POA: Diagnosis not present

## 2019-09-03 DIAGNOSIS — E1129 Type 2 diabetes mellitus with other diabetic kidney complication: Secondary | ICD-10-CM | POA: Diagnosis not present

## 2019-09-03 DIAGNOSIS — C50912 Malignant neoplasm of unspecified site of left female breast: Secondary | ICD-10-CM

## 2019-09-03 LAB — POCT GLYCOSYLATED HEMOGLOBIN (HGB A1C)
Est. average glucose Bld gHb Est-mCnc: 169
Hemoglobin A1C: 7.5 % — AB (ref 4.0–5.6)

## 2019-09-03 MED ORDER — METFORMIN HCL 1000 MG PO TABS: 1000.0000 mg | ORAL_TABLET | Freq: Two times a day (BID) | ORAL | 0 refills | Status: DC

## 2019-09-03 MED ORDER — DILTIAZEM HCL ER 120 MG PO CP12: 120.0000 mg | ORAL_CAPSULE | Freq: Two times a day (BID) | ORAL | 1 refills | Status: DC

## 2019-09-16 ENCOUNTER — Telehealth: Payer: Self-pay | Admitting: Physician Assistant

## 2019-09-16 DIAGNOSIS — C50912 Malignant neoplasm of unspecified site of left female breast: Secondary | ICD-10-CM

## 2019-09-16 NOTE — Telephone Encounter (Signed)
Per [facility] if pt is not having any new breast issues she will need an order for screening mammogram right breast ID:[id number]

## 2019-09-26 ENCOUNTER — Other Ambulatory Visit: Payer: Self-pay | Admitting: Physician Assistant

## 2019-09-26 DIAGNOSIS — E1129 Type 2 diabetes mellitus with other diabetic kidney complication: Secondary | ICD-10-CM

## 2019-09-30 ENCOUNTER — Encounter: Payer: Self-pay | Admitting: Physician Assistant

## 2019-09-30 DIAGNOSIS — E1129 Type 2 diabetes mellitus with other diabetic kidney complication: Secondary | ICD-10-CM

## 2019-10-02 MED ORDER — CANAGLIFLOZIN 300 MG PO TABS: 300.0000 mg | ORAL_TABLET | Freq: Every day | ORAL | 0 refills | Status: DC

## 2019-10-02 MED ORDER — METFORMIN HCL 500 MG PO TABS: 500.0000 mg | ORAL_TABLET | Freq: Two times a day (BID) | ORAL | 3 refills | Status: DC

## 2019-10-21 ENCOUNTER — Telehealth: Payer: Self-pay | Admitting: *Deleted

## 2019-10-21 NOTE — Telephone Encounter (Signed)
Contacted in attempt to schedule lung screening scan. Patient would like to wait until January to consider due to covid concerns.

## 2019-10-24 ENCOUNTER — Ambulatory Visit
Admission: RE | Admit: 2019-10-24 | Discharge: 2019-10-24 | Disposition: A | Discharge status: Home / Self Care | Payer: PPO | Source: Ambulatory Visit | Attending: Physician Assistant | Admitting: Physician Assistant

## 2019-10-24 DIAGNOSIS — Z1231 Encounter for screening mammogram for malignant neoplasm of breast: Secondary | ICD-10-CM | POA: Diagnosis not present

## 2019-10-24 DIAGNOSIS — C50912 Malignant neoplasm of unspecified site of left female breast: Secondary | ICD-10-CM

## 2019-10-24 DIAGNOSIS — Z853 Personal history of malignant neoplasm of breast: Secondary | ICD-10-CM | POA: Insufficient documentation

## 2019-10-24 DIAGNOSIS — Z9012 Acquired absence of left breast and nipple: Secondary | ICD-10-CM | POA: Insufficient documentation

## 2019-10-27 ENCOUNTER — Other Ambulatory Visit: Payer: Self-pay | Admitting: Physician Assistant

## 2019-10-27 DIAGNOSIS — E1129 Type 2 diabetes mellitus with other diabetic kidney complication: Secondary | ICD-10-CM

## 2019-10-30 ENCOUNTER — Encounter: Payer: Self-pay | Admitting: Physician Assistant

## 2019-11-13 ENCOUNTER — Other Ambulatory Visit: Payer: Self-pay

## 2019-11-19 ENCOUNTER — Other Ambulatory Visit: Payer: Self-pay | Admitting: Physician Assistant

## 2019-11-19 DIAGNOSIS — J302 Other seasonal allergic rhinitis: Secondary | ICD-10-CM

## 2019-11-19 DIAGNOSIS — E785 Hyperlipidemia, unspecified: Secondary | ICD-10-CM

## 2019-12-03 ENCOUNTER — Other Ambulatory Visit: Payer: Self-pay | Admitting: Physician Assistant

## 2019-12-03 DIAGNOSIS — E1129 Type 2 diabetes mellitus with other diabetic kidney complication: Secondary | ICD-10-CM

## 2019-12-03 NOTE — Progress Notes (Signed)
Patient: Heather Burke, Female    DOB: 1951/12/02, 68 y.o.   MRN: [id number] Visit Date: 12/04/2019  Today's Provider: [name], PA-C   Chief Complaint  Patient presents with  . Medicare Wellness   Subjective:     Annual wellness visit Heather Burke is a 68 y.o. female. She feels well. She reports exercising none. She reports she is sleeping fairly well. ----------------------------------------------------------- 10/24/2019-Mammogram Normal 09/13/16-Colonoscopy  Review of Systems  Constitutional: Negative.   HENT: Negative.   Eyes: Negative.   Respiratory: Positive for choking ('once" 3-4 weeks ago while eating).   Cardiovascular: Negative.   Gastrointestinal: Negative.   Endocrine: Negative.   Genitourinary: Negative.   Musculoskeletal: Negative.   Skin: Negative.   Allergic/Immunologic: Negative.   Neurological: Negative.   Hematological: Negative.   Psychiatric/Behavioral: Positive for sleep disturbance.    Social History   Socioeconomic History  . Marital status: Widowed    Spouse name: Not on file  . Number of children: 2  . Years of education: H/S  . Highest education level: High school graduate  Occupational History  . Occupation: Part-Time  Tobacco Use  . Smoking status: Current Every Day Smoker    Packs/day: 2.00    Years: 49.00    Pack years: 98.00    Types: Cigarettes  . Smokeless tobacco: Never Used  Substance and Sexual Activity  . Alcohol use: Yes    Alcohol/week: 14.0 - 28.0 standard drinks    Types: 14 - 28 Cans of beer per week  . Drug use: No  . Sexual activity: Not on file  Other Topics Concern  . Not on file  Social History Narrative  . Not on file   Social Determinants of Health   Financial Resource Strain:   . Difficulty of Paying Living Expenses: Not on file  Food Insecurity:   . Worried About Charity fundraiser in the Last Year: Not on file  . Ran Out of Food in the Last Year: Not on file    Transportation Needs:   . Lack of Transportation (Medical): Not on file  . Lack of Transportation (Non-Medical): Not on file  Physical Activity:   . Days of Exercise per Week: Not on file  . Minutes of Exercise per Session: Not on file  Stress:   . Feeling of Stress : Not on file  Social Connections:   . Frequency of Communication with Friends and Family: Not on file  . Frequency of Social Gatherings with Friends and Family: Not on file  . Attends Religious Services: Not on file  . Active Member of Clubs or Organizations: Not on file  . Attends Archivist Meetings: Not on file  . Marital Status: Not on file  Intimate Partner Violence:   . Fear of Current or Ex-Partner: Not on file  . Emotionally Abused: Not on file  . Physically Abused: Not on file  . Sexually Abused: Not on file    Past Medical History:  Diagnosis Date  . Breast cancer ([place]) 06/08/2015   T1c, N0;  ER+,PR+, Her 2 neg (FISH neg) x2., SLN x 3 negative. Mammoprint: Low risk. Multifocal.Invasive mammary and lobular carcinoma.   Marland Kitchen GERD (gastroesophageal reflux disease)   . Heart murmur 2016   newly diagnosed, slight murmur  . Hyperlipidemia   . Hypertension   . Hypothyroidism   . Lupus ([place])   . PONV (postoperative nausea and vomiting)    with Hysterectomy  .  Thyroid disease      Patient Active Problem List   Diagnosis Date Noted  . Benign neoplasm of sigmoid colon   . Mass of right breast 02/17/2016  . History of parotitis 01/05/2016  . Hypothyroidism 09/29/2015  . Hyperlipemia 09/29/2015  . Newly recognized murmur 09/29/2015  . Allergic rhinitis 08/27/2015  . Anxiety 08/27/2015  . Type 2 diabetes mellitus with other diabetic kidney complication ([place]) [id number]  . Avitaminosis D 08/27/2015  . Compulsive tobacco user syndrome 08/27/2015  . Hypertension 07/01/2015  . Breast cancer, stage 1 ([place]) 06/17/2015    Past Surgical History:  Procedure Laterality Date  . ABDOMINAL HYSTERECTOMY     . BREAST BIOPSY Right 2002   negative/ Dr. Bary Castilla  . BREAST BIOPSY Left 06-08-15   INVASIVE MAMMARY CARCINOMA WITH PARTIAL SOLID PATTERN.   Marland Kitchen BREAST BIOPSY Right 06/26/2018   FIBROADENOMATOUS CHANGES WITH STROMAL HYALINIZATION AND CALCIFICATIONS  . COLONOSCOPY WITH PROPOFOL N/A 09/13/2016   Procedure: COLONOSCOPY WITH PROPOFOL;  Surgeon: Lucilla Lame, MD;  Location: [facility] ENDOSCOPY;  Service: Endoscopy;  Laterality: N/A;  . core biopsy Left 06/08/15  . GUM SURGERY  2006  . MASTECTOMY Left 2016  . OOPHORECTOMY    . SENTINEL NODE BIOPSY Left 08/18/2015   Procedure: SENTINEL NODE BIOPSY;  Surgeon: [name], MD;  Location: [facility] ORS;  Service: General;  Laterality: Left;  . SIMPLE MASTECTOMY WITH AXILLARY SENTINEL NODE BIOPSY Left 08/18/2015   Procedure: SIMPLE MASTECTOMY;  Surgeon: [name], MD;  Location: [facility] ORS;  Service: General;  Laterality: Left;  . TONSILLECTOMY      Her family history includes Breast cancer in her cousin, cousin and another family member; Cancer in her father; Heart attack in her sister; Thyroid disease in her daughter.   Current Outpatient Medications:  .  aspirin 81 MG tablet, Take 81 mg by mouth daily., Disp: , Rfl:  .  calcium carbonate (OS-CAL) 600 MG TABS tablet, Take 1 tablet (600 mg total) by mouth 2 (two) times daily with a meal., Disp: 60 tablet, Rfl:  .  Cholecalciferol (D3 ADULT PO), Take 1 tablet by mouth daily. 1000 units daily, Disp: , Rfl:  .  diltiazem (CARDIZEM SR) 120 MG 12 hr capsule, Take 1 capsule (120 mg total) by mouth 2 (two) times daily., Disp: 180 capsule, Rfl: 1 .  FIBER PO, Take by mouth daily. , Disp: , Rfl:  .  fluticasone (FLONASE) 50 MCG/ACT nasal spray, SPRAY 2 SPRAYS INTO EACH NOSTRIL EVERY DAY, Disp: 48 mL, Rfl: 1 .  INVOKANA 300 MG TABS tablet, TAKE 1 TABLET (300 MG TOTAL) BY MOUTH DAILY BEFORE BREAKFAST., Disp: 30 tablet, Rfl: 0 .  KLOR-CON M10 10 MEQ tablet, TAKE 1 TABLET BY MOUTH EVERY DAY, Disp: 90 tablet, Rfl:  1 .  levothyroxine (SYNTHROID) 88 MCG tablet, TAKE 1 TABLET BY MOUTH EVERY DAY, Disp: 90 tablet, Rfl: 1 .  losartan-hydrochlorothiazide (HYZAAR) 100-25 MG tablet, Take 1 tablet by mouth daily., Disp: 90 tablet, Rfl: 3 .  lovastatin (MEVACOR) 20 MG tablet, TAKE 1 TABLET BY MOUTH EVERYDAY AT BEDTIME, Disp: 90 tablet, Rfl: 0 .  metoprolol (TOPROL-XL) 200 MG 24 hr tablet, TAKE 1 TABLET BY MOUTH EVERY DAY, Disp: 90 tablet, Rfl: 1 .  Nutritional Supplements (QUINOA/KALE/HEMP PO), Place 750 mg under the tongue., Disp: , Rfl:  .  Omega-3 Fatty Acids (FISH OIL) 1200 MG CAPS, Take by mouth daily. 1200 mg TID, Disp: , Rfl:   Patient Care Team: [name], PA-C  as PCP - General (Family Medicine) Byrnett, Forest Gleason, MD (General Surgery) Lorelee Cover., MD as Consulting Physician (Ophthalmology)    Objective:    Vitals: BP (!) 150/83 (BP Location: Right Arm, Patient Position: Sitting, Cuff Size: Large)   Pulse 83   Temp (!) 97.5 F (36.4 C) (Temporal)   Resp 16   Ht 5\' 2"  (1.575 m)   Wt 184 lb 12.8 oz (83.8 kg)   BMI 33.80 kg/m   Physical Exam Vitals reviewed.  Constitutional:      General: She is not in acute distress.    Appearance: Normal appearance. She is well-developed. She is obese. She is not ill-appearing or diaphoretic.  HENT:     Head: Normocephalic and atraumatic.     Right Ear: Tympanic membrane, ear canal and external ear normal.     Left Ear: Tympanic membrane, ear canal and external ear normal.  Eyes:     General: No scleral icterus.       Right eye: No discharge.        Left eye: No discharge.     Extraocular Movements: Extraocular movements intact.     Conjunctiva/sclera: Conjunctivae normal.     Pupils: Pupils are equal, round, and reactive to light.  Neck:     Thyroid: No thyromegaly.     Vascular: No carotid bruit or JVD.     Trachea: No tracheal deviation.  Cardiovascular:     Rate and Rhythm: Normal rate and regular rhythm.     Pulses: Normal  pulses.     Heart sounds: Normal heart sounds. No murmur. No friction rub. No gallop.   Pulmonary:     Effort: Pulmonary effort is normal. No respiratory distress.     Breath sounds: Normal breath sounds. No wheezing or rales.  Chest:     Chest wall: No tenderness.  Abdominal:     General: Abdomen is protuberant. Bowel sounds are normal. There is no distension.     Palpations: Abdomen is soft. There is no mass.     Tenderness: There is no abdominal tenderness. There is no guarding or rebound.  Musculoskeletal:        General: No tenderness. Normal range of motion.     Cervical back: Normal range of motion and neck supple.     Right lower leg: No edema.     Left lower leg: No edema.  Lymphadenopathy:     Cervical: No cervical adenopathy.  Skin:    General: Skin is warm and dry.     Capillary Refill: Capillary refill takes less than 2 seconds.     Findings: No rash.  Neurological:     General: No focal deficit present.     Mental Status: She is alert and oriented to person, place, and time. Mental status is at baseline.  Psychiatric:        Mood and Affect: Mood normal.        Behavior: Behavior normal.        Thought Content: Thought content normal.        Judgment: Judgment normal.     Activities of Daily Living In your present state of health, do you have any difficulty performing the following activities: 12/04/2019  Hearing? N  Vision? N  Difficulty concentrating or making decisions? N  Walking or climbing stairs? N  Dressing or bathing? N  Doing errands, shopping? N  Some recent data might be hidden    Fall Risk Assessment Fall Risk  12/04/2019  08/23/2018 07/21/2017 07/21/2017  Falls in the past year? 0 No No No  Number falls in past yr: 0 - - -  Injury with Fall? 0 - - -  Follow up Falls evaluation completed - - -     Depression Screen PHQ 2/9 Scores 08/23/2018 07/21/2017 07/21/2017  PHQ - 2 Score 0 0 0    6CIT Screen 12/04/2019  What Year? 0 points  What month? 0  points  What time? 0 points  Count back from 20 0 points  Months in reverse 0 points  Repeat phrase 2 points  Total Score 2      Assessment & Plan:     Annual Wellness Visit  Reviewed patient's Family Medical History Reviewed and updated list of patient's medical providers Assessment of cognitive impairment was done Assessed patient's functional ability Established a written schedule for health screening [place] Completed and Reviewed  Exercise Activities and Dietary recommendations Goals    . Reduce sugar intake      Recommend decreasing junk food as snacks and increasing more healthy snacks.        Immunization History  Administered Date(s) Administered  . Influenza Split 10/21/2010, 10/13/2012  . Influenza, High Dose Seasonal PF 10/04/2018  . Influenza,inj,Quad PF,6+ Mos 10/02/2014, 09/09/2015  . Influenza-Unspecified 09/21/2016, 10/03/2017, 09/20/2019  . Pneumococcal Conjugate-13 07/21/2017  . Pneumococcal Polysaccharide-23 01/22/2013, 08/23/2018  . Tdap 10/21/2010  . Zoster 10/03/2013    Health Maintenance  Topic Date Due  . OPHTHALMOLOGY EXAM  11/18/2017  . FOOT EXAM  08/31/2019  . HEMOGLOBIN A1C  03/02/2020  . TETANUS/TDAP  10/21/2020  . MAMMOGRAM  10/23/2021  . COLONOSCOPY  09/13/2026  . INFLUENZA VACCINE  Completed  . DEXA SCAN  Completed  . Hepatitis C Screening  Completed  . PNA vac Low Risk Adult  Completed     Discussed health benefits of physical activity, and encouraged her to engage in regular exercise appropriate for her age and condition.    1. Medicare annual wellness visit, subsequent Normal exam. Up to date on screenings and vaccinations.  - CBC w/Diff - Comprehensive Metabolic Panel (CMET)  2. Type 2 diabetes mellitus with other diabetic kidney complication (HCC) Stable. Diagnosis pulled for medication refill. Continue current medical treatment plan. Will check labs as below and f/u pending results. -  canagliflozin (INVOKANA) 300 MG TABS tablet; TAKE 1 TABLET (300 MG TOTAL) BY MOUTH DAILY BEFORE BREAKFAST.  Dispense: 90 tablet; Refill: 1 - HgB A1c  3. Pure hypercholesterolemia Stable. Continue Lovastatin 20mg . Will check labs as below and f/u pending results. - Lipid Profile  4. Hypothyroidism, unspecified type Stable. Continue levothyroxine 40mcg. Will check labs as below and f/u pending results. - TSH   ------------------------------------------------------------------------------------------------------------    [name], PA-C  [facility]

## 2019-12-04 ENCOUNTER — Ambulatory Visit (INDEPENDENT_AMBULATORY_CARE_PROVIDER_SITE_OTHER): Payer: PPO | Admitting: Physician Assistant

## 2019-12-04 ENCOUNTER — Encounter: Payer: Self-pay | Admitting: Physician Assistant

## 2019-12-04 ENCOUNTER — Other Ambulatory Visit: Payer: Self-pay

## 2019-12-04 VITALS — BP 150/83 | HR 83 | Temp 97.5°F | Resp 16 | Ht 62.0 in | Wt 184.8 lb

## 2019-12-04 DIAGNOSIS — E1129 Type 2 diabetes mellitus with other diabetic kidney complication: Secondary | ICD-10-CM | POA: Diagnosis not present

## 2019-12-04 DIAGNOSIS — Z0000 Encounter for general adult medical examination without abnormal findings: Secondary | ICD-10-CM | POA: Diagnosis not present

## 2019-12-04 DIAGNOSIS — E7800 Pure hypercholesterolemia, unspecified: Secondary | ICD-10-CM

## 2019-12-04 DIAGNOSIS — E039 Hypothyroidism, unspecified: Secondary | ICD-10-CM | POA: Diagnosis not present

## 2019-12-04 MED ORDER — CANAGLIFLOZIN 300 MG PO TABS: ORAL_TABLET | ORAL | 1 refills | Status: DC

## 2019-12-04 NOTE — Patient Instructions (Signed)
Health Maintenance After Age 67 After age 67, you are at a higher risk for certain long-term diseases and infections as well as injuries from falls. Falls are a major cause of broken bones and head injuries in people who are older than age 67. Getting regular preventive care can help to keep you healthy and well. Preventive care includes getting regular testing and making lifestyle changes as recommended by your health care provider. Talk with your health care provider about:  Which screenings and tests you should have. A screening is a test that checks for a disease when you have no symptoms.  A diet and exercise plan that is right for you. What should I know about screenings and tests to prevent falls? Screening and testing are the best ways to find a health problem early. Early diagnosis and treatment give you the best chance of managing medical conditions that are common after age 67. Certain conditions and lifestyle choices may make you more likely to have a fall. Your health care provider may recommend:  Regular vision checks. Poor vision and conditions such as cataracts can make you more likely to have a fall. If you wear glasses, make sure to get your prescription updated if your vision changes.  Medicine review. Work with your health care provider to regularly review all of the medicines you are taking, including over-the-counter medicines. Ask your health care provider about any side effects that may make you more likely to have a fall. Tell your health care provider if any medicines that you take make you feel dizzy or sleepy.  Osteoporosis screening. Osteoporosis is a condition that causes the bones to get weaker. This can make the bones weak and cause them to break more easily.  Blood pressure screening. Blood pressure changes and medicines to control blood pressure can make you feel dizzy.  Strength and balance checks. Your health care provider may recommend certain tests to check your  strength and balance while standing, walking, or changing positions.  Foot health exam. Foot pain and numbness, as well as not wearing proper footwear, can make you more likely to have a fall.  Depression screening. You may be more likely to have a fall if you have a fear of falling, feel emotionally low, or feel unable to do activities that you used to do.  Alcohol use screening. Using too much alcohol can affect your balance and may make you more likely to have a fall. What actions can I take to lower my risk of falls? General instructions  Talk with your health care provider about your risks for falling. Tell your health care provider if: ? You fall. Be sure to tell your health care provider about all falls, even ones that seem minor. ? You feel dizzy, sleepy, or off-balance.  Take over-the-counter and prescription medicines only as told by your health care provider. These include any supplements.  Eat a healthy diet and maintain a healthy weight. A healthy diet includes low-fat dairy products, low-fat (lean) meats, and fiber from whole grains, beans, and lots of fruits and vegetables. Home safety  Remove any tripping hazards, such as rugs, cords, and clutter.  Install safety equipment such as grab bars in bathrooms and safety rails on stairs.  Keep rooms and walkways well-lit. Activity   Follow a regular exercise program to stay fit. This will help you maintain your balance. Ask your health care provider what types of exercise are appropriate for you.  If you need a cane or   walker, use it as recommended by your health care provider.  Wear supportive shoes that have nonskid soles. Lifestyle  Do not drink alcohol if your health care provider tells you not to drink.  If you drink alcohol, limit how much you have: ? 0-1 drink a day for women. ? 0-2 drinks a day for men.  Be aware of how much alcohol is in your drink. In the [place] one drink equals one typical bottle of beer (12  oz), one-half glass of wine (5 oz), or one shot of hard liquor (1 oz).  Do not use any products that contain nicotine or tobacco, such as cigarettes and e-cigarettes. If you need help quitting, ask your health care provider. Summary  Having a healthy lifestyle and getting preventive care can help to protect your health and wellness after age 67.  Screening and testing are the best way to find a health problem early and help you avoid having a fall. Early diagnosis and treatment give you the best chance for managing medical conditions that are more common for people who are older than age 67.  Falls are a major cause of broken bones and head injuries in people who are older than age 67. Take precautions to prevent a fall at home.  Work with your health care provider to learn what changes you can make to improve your health and wellness and to prevent falls. This information is not intended to replace advice given to you by your health care provider. Make sure you discuss any questions you have with your health care provider. Document Released: 10/18/2017 Document Revised: 03/28/2019 Document Reviewed: 10/18/2017 Elsevier Patient Education  2020 Elsevier Inc.  

## 2019-12-04 NOTE — Telephone Encounter (Signed)
Requested Prescriptions  Pending Prescriptions Disp Refills  . INVOKANA 300 MG TABS tablet [Pharmacy Med Name: INVOKANA 300 MG TABLET] 30 tablet 0    Sig: TAKE 1 TABLET (300 MG TOTAL) BY MOUTH DAILY BEFORE BREAKFAST.     Endocrinology: Diabetes - SGLT2 Inhibitors - canagliflozin Failed - 12/03/2019  8:51 PM      Failed - Cr in normal range and within 360 days    Creatinine, Ser  Date Value Ref Range Status  09/18/2018 0.69 0.57 - 1.00 mg/dL Final         Failed - LDL in normal range and within 360 days    LDL Calculated  Date Value Ref Range Status  09/18/2018 74 0 - 99 mg/dL Final         Failed - eGFR in normal range and within 360 days    GFR calc Af Amer  Date Value Ref Range Status  09/18/2018 105 >59 mL/min/1.73 Final   GFR calc non Af Amer  Date Value Ref Range Status  09/18/2018 91 >59 mL/min/1.73 Final         Passed - HBA1C is between 0 and 7.9 and within 180 days    Hemoglobin A1C  Date Value Ref Range Status  09/03/2019 7.5 (A) 4.0 - 5.6 % Final   Hgb A1c MFr Bld  Date Value Ref Range Status  09/18/2018 6.8 (H) 4.8 - 5.6 % Final    Comment:             Prediabetes: 5.7 - 6.4          Diabetes: >6.4          Glycemic control for adults with diabetes: <7.0          Passed - Valid encounter within last 6 months    Recent Outpatient Visits          3 months ago Type 2 diabetes mellitus with other diabetic kidney complication [facility])   [facility] [place], [place], [place]   8 months ago Essential hypertension   [place], Clearnce Sorrel, [place]   1 year ago Essential hypertension   [place], Clearnce Sorrel, [place]   1 year ago Annual physical exam   [facility] [place], Clearnce Sorrel, [place]   1 year ago Essential hypertension   [place], [place], [place]

## 2019-12-05 ENCOUNTER — Telehealth: Payer: Self-pay

## 2019-12-05 LAB — COMPREHENSIVE METABOLIC PANEL
ALT: 43 IU/L — ABNORMAL HIGH (ref 0–32)
AST: 55 IU/L — ABNORMAL HIGH (ref 0–40)
Albumin/Globulin Ratio: 1.3 (ref 1.2–2.2)
Albumin: 4.3 g/dL (ref 3.8–4.8)
Alkaline Phosphatase: 64 IU/L (ref 39–117)
BUN/Creatinine Ratio: 12 (ref 12–28)
BUN: 12 mg/dL (ref 8–27)
Bilirubin Total: 0.5 mg/dL (ref 0.0–1.2)
CO2: 22 mmol/L (ref 20–29)
Calcium: 10.2 mg/dL (ref 8.7–10.3)
Chloride: 96 mmol/L (ref 96–106)
Creatinine, Ser: 0.98 mg/dL (ref 0.57–1.00)
GFR calc Af Amer: 69 mL/min/{1.73_m2} (ref >59)
GFR calc non Af Amer: 60 mL/min/{1.73_m2} (ref >59)
Globulin, Total: 3.3 g/dL (ref 1.5–4.5)
Glucose: 150 mg/dL — ABNORMAL HIGH (ref 65–99)
Potassium: 4 mmol/L (ref 3.5–5.2)
Sodium: 138 mmol/L (ref 134–144)
Total Protein: 7.6 g/dL (ref 6.0–8.5)

## 2019-12-05 LAB — CBC WITH DIFFERENTIAL/PLATELET
Basophils Absolute: 0.1 10*3/uL (ref 0.0–0.2)
Basos: 1 %
EOS (ABSOLUTE): 0.1 10*3/uL (ref 0.0–0.4)
Eos: 2 %
Hematocrit: 43.8 % (ref 34.0–46.6)
Hemoglobin: 15.1 g/dL (ref 11.1–15.9)
Immature Grans (Abs): 0 10*3/uL (ref 0.0–0.1)
Immature Granulocytes: 0 %
Lymphocytes Absolute: 2.2 10*3/uL (ref 0.7–3.1)
Lymphs: 32 %
MCH: 32.8 pg (ref 26.6–33.0)
MCHC: 34.5 g/dL (ref 31.5–35.7)
MCV: 95 fL (ref 79–97)
Monocytes Absolute: 0.5 10*3/uL (ref 0.1–0.9)
Monocytes: 7 %
Neutrophils Absolute: 4.1 10*3/uL (ref 1.4–7.0)
Neutrophils: 58 %
Platelets: 237 10*3/uL (ref 150–450)
RBC: 4.6 x10E6/uL (ref 3.77–5.28)
RDW: 12.5 % (ref 11.7–15.4)
WBC: 7 10*3/uL (ref 3.4–10.8)

## 2019-12-05 LAB — LIPID PANEL
Chol/HDL Ratio: 4.5 ratio — ABNORMAL HIGH (ref 0.0–4.4)
Cholesterol, Total: 168 mg/dL (ref 100–199)
HDL: 37 mg/dL — ABNORMAL LOW (ref >39)
LDL Chol Calc (NIH): 95 mg/dL (ref 0–99)
Triglycerides: 212 mg/dL — ABNORMAL HIGH (ref 0–149)
VLDL Cholesterol Cal: 36 mg/dL (ref 5–40)

## 2019-12-05 LAB — HEMOGLOBIN A1C
Est. average glucose Bld gHb Est-mCnc: 151 mg/dL
Hgb A1c MFr Bld: 6.9 % — ABNORMAL HIGH (ref 4.8–5.6)

## 2019-12-05 LAB — TSH: TSH: 2.29 u[IU]/mL (ref 0.450–4.500)

## 2019-12-05 NOTE — Telephone Encounter (Signed)
Patient advised as directed below. 

## 2019-12-05 NOTE — Telephone Encounter (Signed)
-----   Message from [name], [place] sent at 12/05/2019  8:25 AM EST ----- Blood count is normal. Kidney function is normal. Liver enzymes are still borderline high but essentially stable. Sodium, potassium, and calcium are normal. A1c has improved to 6.9 from 7.5! Keep up the good work. Cholesterol is normal and stable. Thyroid is normal.

## 2019-12-06 ENCOUNTER — Other Ambulatory Visit: Payer: Self-pay | Admitting: Physician Assistant

## 2019-12-06 DIAGNOSIS — E1129 Type 2 diabetes mellitus with other diabetic kidney complication: Secondary | ICD-10-CM

## 2019-12-25 ENCOUNTER — Other Ambulatory Visit: Payer: Self-pay | Admitting: Physician Assistant

## 2019-12-25 DIAGNOSIS — E113393 Type 2 diabetes mellitus with moderate nonproliferative diabetic retinopathy without macular edema, bilateral: Secondary | ICD-10-CM | POA: Diagnosis not present

## 2019-12-25 DIAGNOSIS — I10 Essential (primary) hypertension: Secondary | ICD-10-CM

## 2020-01-16 ENCOUNTER — Telehealth: Payer: Self-pay | Admitting: *Deleted

## 2020-01-16 NOTE — Telephone Encounter (Signed)
Contacted in attempt to schedule lung screening scan. Patient would like to wait until June to consider related to covid concerns.

## 2020-01-22 ENCOUNTER — Encounter: Payer: Self-pay | Admitting: Physician Assistant

## 2020-01-27 ENCOUNTER — Other Ambulatory Visit: Payer: Self-pay | Admitting: Physician Assistant

## 2020-01-27 DIAGNOSIS — E876 Hypokalemia: Secondary | ICD-10-CM

## 2020-01-30 ENCOUNTER — Other Ambulatory Visit: Payer: Self-pay | Admitting: Physician Assistant

## 2020-01-30 DIAGNOSIS — I10 Essential (primary) hypertension: Secondary | ICD-10-CM

## 2020-01-30 NOTE — Telephone Encounter (Signed)
Requested Prescriptions  Pending Prescriptions Disp Refills  . metoprolol (TOPROL-XL) 200 MG 24 hr tablet [Pharmacy Med Name: METOPROLOL SUCC ER 200 MG TAB] 90 tablet 1    Sig: TAKE 1 TABLET BY MOUTH EVERY DAY     Cardiovascular:  Beta Blockers Failed - 01/30/2020 12:07 PM      Failed - Last BP in normal range    BP Readings from Last 1 Encounters:  12/04/19 (!) 150/83         Passed - Last Heart Rate in normal range    Pulse Readings from Last 1 Encounters:  12/04/19 83         Passed - Valid encounter within last 6 months    Recent Outpatient Visits          4 months ago Type 2 diabetes mellitus with other diabetic kidney complication [facility])   [facility] [place], [place], [place]   10 months ago Essential hypertension   [place], Clearnce Sorrel, [place]   1 year ago Essential hypertension   [place], Clearnce Sorrel, [place]   1 year ago Annual physical exam   [facility] [place], Clearnce Sorrel, [place]   2 years ago Essential hypertension   [place], Clearnce Sorrel, [place]      Future Appointments            In 4 months [place], Clearnce Sorrel, PA-C [employer], [place]

## 2020-02-01 ENCOUNTER — Other Ambulatory Visit: Payer: Self-pay | Admitting: Physician Assistant

## 2020-02-01 DIAGNOSIS — E039 Hypothyroidism, unspecified: Secondary | ICD-10-CM

## 2020-02-10 ENCOUNTER — Other Ambulatory Visit: Payer: Self-pay | Admitting: Physician Assistant

## 2020-02-10 DIAGNOSIS — E785 Hyperlipidemia, unspecified: Secondary | ICD-10-CM

## 2020-02-13 ENCOUNTER — Other Ambulatory Visit: Payer: Self-pay | Admitting: Physician Assistant

## 2020-02-13 DIAGNOSIS — I10 Essential (primary) hypertension: Secondary | ICD-10-CM

## 2020-04-21 ENCOUNTER — Other Ambulatory Visit: Payer: Self-pay | Admitting: Physician Assistant

## 2020-04-21 DIAGNOSIS — I10 Essential (primary) hypertension: Secondary | ICD-10-CM

## 2020-04-21 NOTE — Telephone Encounter (Signed)
Enough until appointment

## 2020-05-04 ENCOUNTER — Other Ambulatory Visit: Payer: Self-pay | Admitting: Physician Assistant

## 2020-05-04 DIAGNOSIS — E785 Hyperlipidemia, unspecified: Secondary | ICD-10-CM

## 2020-05-09 ENCOUNTER — Other Ambulatory Visit: Payer: Self-pay | Admitting: Physician Assistant

## 2020-05-09 DIAGNOSIS — I10 Essential (primary) hypertension: Secondary | ICD-10-CM

## 2020-05-13 ENCOUNTER — Other Ambulatory Visit: Payer: Self-pay | Admitting: Physician Assistant

## 2020-05-13 DIAGNOSIS — J302 Other seasonal allergic rhinitis: Secondary | ICD-10-CM

## 2020-05-13 NOTE — Telephone Encounter (Signed)
Requested Prescriptions  Pending Prescriptions Disp Refills  . fluticasone (FLONASE) 50 MCG/ACT nasal spray [Pharmacy Med Name: FLUTICASONE PROP 50 MCG SPRAY] 48 mL 1    Sig: SPRAY 2 SPRAYS INTO EACH NOSTRIL EVERY DAY     Ear, Nose, and Throat: Nasal Preparations - Corticosteroids Passed - 05/13/2020  1:24 AM      Passed - Valid encounter within last 12 months    Recent Outpatient Visits          8 months ago Type 2 diabetes mellitus with other diabetic kidney complication [facility])   [facility] [place], Clearnce Sorrel, [place]   1 year ago Essential hypertension   [place], Clearnce Sorrel, [place]   1 year ago Essential hypertension   [place], Clearnce Sorrel, [place]   1 year ago Annual physical exam   [place], Clearnce Sorrel, [place]   2 years ago Essential hypertension   [place], Clearnce Sorrel, [place]      Future Appointments            In 3 weeks Marlyn Corporal, Clearnce Sorrel, PA-C [employer], [facility]

## 2020-05-14 ENCOUNTER — Encounter: Payer: Self-pay | Admitting: Physician Assistant

## 2020-05-14 DIAGNOSIS — E1129 Type 2 diabetes mellitus with other diabetic kidney complication: Secondary | ICD-10-CM

## 2020-05-14 MED ORDER — EMPAGLIFLOZIN 25 MG PO TABS: 25.0000 mg | ORAL_TABLET | Freq: Every day | ORAL | 3 refills | Status: DC

## 2020-05-14 MED ORDER — GLIPIZIDE 10 MG PO TABS: 10.0000 mg | ORAL_TABLET | Freq: Two times a day (BID) | ORAL | 3 refills | Status: DC

## 2020-05-14 NOTE — Addendum Note (Signed)
Addended by: [name] on: 05/14/2020 07:19 PM   Modules accepted: Orders

## 2020-05-19 ENCOUNTER — Telehealth: Payer: Self-pay | Admitting: Physician Assistant

## 2020-05-19 NOTE — Chronic Care Management (AMB) (Signed)
  Chronic Care Management   Note  05/19/2020 Name: Heather Burke MRN: [id number] DOB: 1951/04/05  Heather Burke is a 69 y.o. year old female who is a primary care patient of Rubye Beach. I reached out to [name] by phone today in response to a referral sent by Ms. Heather [place] [employer]'s health plan.     [name] was given information about Chronic Care Management services today including:  1. CCM service includes personalized support from designated clinical staff supervised by her physician, including individualized plan of care and coordination with other care providers 2. 24/7 contact phone numbers for assistance for urgent and routine care needs. 3. Service will only be billed when office clinical staff spend 20 minutes or more in a month to coordinate care. 4. Only one practitioner may furnish and bill the service in a calendar month. 5. The patient may stop CCM services at any time (effective at the end of the month) by phone call to the office staff. 6. The patient will be responsible for cost sharing (co-pay) of up to 20% of the service fee (after annual deductible is met).  Patient agreed to services and verbal consent obtained.   Follow up plan: Telephone appointment with care management team member scheduled for:06/19/2020  [place], [place], [place] [address] Direct Dial: [email or web address] Website: [email or web address]

## 2020-06-03 NOTE — Progress Notes (Signed)
Established patient visit   Patient: Heather Burke   DOB: May 16, 1951   69 y.o. Female  MRN: [id number] Visit Date: 06/04/2020  Today's healthcare provider: [name], PA-C   Chief Complaint  Patient presents with  . Diabetes  . Hypertension   Subjective    HPI Diabetes Mellitus Type II, Follow-up  Lab Results  Component Value Date   HGBA1C 7.9 (A) 06/04/2020   HGBA1C 6.9 (H) 12/04/2019   HGBA1C 7.5 (A) 09/03/2019   Wt Readings from Last 3 Encounters:  06/04/20 187 lb 12.8 oz (85.2 kg)  12/04/19 184 lb 12.8 oz (83.8 kg)  09/03/19 189 lb (85.7 kg)   Last seen for diabetes 6 months ago.  Management since then includes starting Glipizide 10 mg BID. She reports excellent compliance with treatment. She is having side effects. Lightheaded and shaky Symptoms: No fatigue No foot ulcerations  No appetite changes No nausea  No paresthesia of the feet  No polydipsia  No polyuria No visual disturbances   No vomiting     Home blood sugar records: fasting range: not being checked  Episodes of hypoglycemia? Yes shaky   Current insulin regiment: none Most Recent Eye Exam: UTD Dr. Gloriann Loan Current exercise: none Current diet habits: well balanced  Pertinent Labs: Lab Results  Component Value Date   CHOL 168 12/04/2019   HDL 37 (L) 12/04/2019   LDLCALC 95 12/04/2019   TRIG 212 (H) 12/04/2019   CHOLHDL 4.5 (H) 12/04/2019   Lab Results  Component Value Date   NA 138 12/04/2019   K 4.0 12/04/2019   CREATININE 0.98 12/04/2019   GFRNONAA 60 12/04/2019   GFRAA 69 12/04/2019   GLUCOSE 150 (H) 12/04/2019     --------------------------------------------------------------------------------------------------- Hypertension, follow-up  BP Readings from Last 3 Encounters:  06/04/20 (!) 151/77  12/04/19 (!) 150/83  09/03/19 (!) 183/83   Wt Readings from Last 3 Encounters:  06/04/20 187 lb 12.8 oz (85.2 kg)  12/04/19 184 lb 12.8 oz (83.8 kg)  09/03/19 189 lb  (85.7 kg)     She was last seen for hypertension 6 months ago.  BP at that visit was 183/83. Management since that visit includes increase Diltiazem to 120 mg BID.  She reports excellent compliance with treatment. She is not having side effects.  She is following a Regular diet. She is not exercising. She does smoke.  Use of agents associated with hypertension: none.   Outside blood pressures are not being checked. Symptoms: No chest pain No chest pressure  No palpitations No syncope  No dyspnea No orthopnea  No paroxysmal nocturnal dyspnea Yes lower extremity edema   Pertinent labs: Lab Results  Component Value Date   CHOL 168 12/04/2019   HDL 37 (L) 12/04/2019   LDLCALC 95 12/04/2019   TRIG 212 (H) 12/04/2019   CHOLHDL 4.5 (H) 12/04/2019   Lab Results  Component Value Date   NA 138 12/04/2019   K 4.0 12/04/2019   CREATININE 0.98 12/04/2019   GFRNONAA 60 12/04/2019   GFRAA 69 12/04/2019   GLUCOSE 150 (H) 12/04/2019     The 10-year ASCVD risk score Mikey Bussing DC Jr., et al., 2013) is: 41%   ---------------------------------------------------------------------------------------------------   Patient Active Problem List   Diagnosis Date Noted  . Benign neoplasm of sigmoid colon   . Mass of right breast 02/17/2016  . History of parotitis 01/05/2016  . Hypothyroidism 09/29/2015  . Hyperlipemia 09/29/2015  . Newly recognized murmur 09/29/2015  . Allergic  rhinitis 08/27/2015  . Anxiety 08/27/2015  . Type 2 diabetes mellitus with other diabetic kidney complication ([place]) 71/24/5809  . Avitaminosis D 08/27/2015  . Compulsive tobacco user syndrome 08/27/2015  . Hypertension 07/01/2015   Social History   Tobacco Use  . Smoking status: Current Every Day Smoker    Packs/day: 2.00    Years: 49.00    Pack years: 98.00    Types: Cigarettes  . Smokeless tobacco: Never Used  Vaping Use  . Vaping Use: Never used  Substance Use Topics  . Alcohol use: Yes     Alcohol/week: 14.0 - 28.0 standard drinks    Types: 14 - 28 Cans of beer per week  . Drug use: No       Medications: Outpatient Medications Prior to Visit  Medication Sig  . aspirin 81 MG tablet Take 81 mg by mouth daily.  . calcium carbonate (OS-CAL) 600 MG TABS tablet Take 1 tablet (600 mg total) by mouth 2 (two) times daily with a meal.  . Cholecalciferol (D3 ADULT PO) Take 1 tablet by mouth daily. 1000 units daily  . FIBER PO Take by mouth daily.   . fluticasone (FLONASE) 50 MCG/ACT nasal spray SPRAY 2 SPRAYS INTO EACH NOSTRIL EVERY DAY  . Nutritional Supplements (QUINOA/KALE/HEMP PO) Place 750 mg under the tongue.  . Omega-3 Fatty Acids (FISH OIL) 1200 MG CAPS Take by mouth daily. 1200 mg TID  . [DISCONTINUED] diltiazem (CARDIZEM SR) 120 MG 12 hr capsule TAKE 1 CAPSULE (120 MG TOTAL) BY MOUTH 2 (TWO) TIMES DAILY.  . [DISCONTINUED] glipiZIDE (GLUCOTROL) 10 MG tablet Take 1 tablet (10 mg total) by mouth 2 (two) times daily before a meal.  . [DISCONTINUED] KLOR-CON M10 10 MEQ tablet TAKE 1 TABLET BY MOUTH EVERY DAY  . [DISCONTINUED] levothyroxine (SYNTHROID) 88 MCG tablet TAKE 1 TABLET BY MOUTH EVERY DAY  . [DISCONTINUED] losartan-hydrochlorothiazide (HYZAAR) 100-25 MG tablet TAKE 1 TABLET BY MOUTH EVERY DAY  . [DISCONTINUED] lovastatin (MEVACOR) 20 MG tablet TAKE 1 TABLET BY MOUTH EVERYDAY AT BEDTIME  . [DISCONTINUED] metoprolol (TOPROL-XL) 200 MG 24 hr tablet TAKE 1 TABLET BY MOUTH EVERY DAY   No facility-administered medications prior to visit.    Review of Systems  Constitutional: Negative.   Respiratory: Negative.   Cardiovascular: Negative.   Endocrine: Negative.   Neurological: Negative.     Last CBC Lab Results  Component Value Date   WBC 7.0 12/04/2019   HGB 15.1 12/04/2019   HCT 43.8 12/04/2019   MCV 95 12/04/2019   MCH 32.8 12/04/2019   RDW 12.5 12/04/2019   PLT 237 98/33/8250   Last metabolic panel Lab Results  Component Value Date   GLUCOSE 150 (H)  12/04/2019   NA 138 12/04/2019   K 4.0 12/04/2019   CL 96 12/04/2019   CO2 22 12/04/2019   BUN 12 12/04/2019   CREATININE 0.98 12/04/2019   GFRNONAA 60 12/04/2019   GFRAA 69 12/04/2019   CALCIUM 10.2 12/04/2019   PROT 7.6 12/04/2019   ALBUMIN 4.3 12/04/2019   LABGLOB 3.3 12/04/2019   AGRATIO 1.3 12/04/2019   BILITOT 0.5 12/04/2019   ALKPHOS 64 12/04/2019   AST 55 (H) 12/04/2019   ALT 43 (H) 12/04/2019   ANIONGAP 13 01/16/2018   Last lipids Lab Results  Component Value Date   CHOL 168 12/04/2019   HDL 37 (L) 12/04/2019   LDLCALC 95 12/04/2019   TRIG 212 (H) 12/04/2019   CHOLHDL 4.5 (H) 12/04/2019  Objective    BP (!) 151/77 (BP Location: Right Arm, Patient Position: Sitting, Cuff Size: Large)   Pulse 64   Temp (!) 97.1 F (36.2 C) (Temporal)   Resp 16   Ht 5\' 4"  (1.626 m)   Wt 187 lb 12.8 oz (85.2 kg)   BMI 32.24 kg/m  BP Readings from Last 3 Encounters:  06/04/20 (!) 151/77  12/04/19 (!) 150/83  09/03/19 (!) 183/83   Wt Readings from Last 3 Encounters:  06/04/20 187 lb 12.8 oz (85.2 kg)  12/04/19 184 lb 12.8 oz (83.8 kg)  09/03/19 189 lb (85.7 kg)      Physical Exam Vitals reviewed.  Constitutional:      General: She is not in acute distress.    Appearance: Normal appearance. She is well-developed. She is obese. She is not ill-appearing or diaphoretic.  Cardiovascular:     Rate and Rhythm: Normal rate and regular rhythm.     Heart sounds: Normal heart sounds. No murmur heard.  No friction rub. No gallop.   Pulmonary:     Effort: Pulmonary effort is normal. No respiratory distress.     Breath sounds: Normal breath sounds. No wheezing or rales.  Musculoskeletal:     Cervical back: Normal range of motion and neck supple.     Right lower leg: No edema.     Left lower leg: No edema.  Neurological:     Mental Status: She is alert.       Results for orders placed or performed in visit on 06/04/20  POCT glycosylated hemoglobin (Hb A1C)  Result  Value Ref Range   Hemoglobin A1C 7.9 (A) 4.0 - 5.6 %   Est. average glucose Bld gHb Est-mCnc 169     Assessment & Plan     1. Type 2 diabetes mellitus with other diabetic kidney complication (HCC) Elevated. Stop Invokana. Start Trulicity. First injection given in office for demonstration of pen. Continue Glipizide 10mg  daily. F/U in 3 months. - Dulaglutide (TRULICITY) 7.34 KA/7.6OT SOPN; Inject 0.5 mLs (0.75 mg total) into the skin once a week.  Dispense: 3 mL; Refill: 5 - glipiZIDE (GLUCOTROL) 10 MG tablet; Take 1 tablet (10 mg total) by mouth daily before breakfast.  Dispense: 90 tablet; Refill: 0  2. Essential hypertension Stable. Diagnosis pulled for medication refill. Continue current medical treatment plan. - diltiazem (CARDIZEM SR) 120 MG 12 hr capsule; Take 1 capsule (120 mg total) by mouth 2 (two) times daily.  Dispense: 90 capsule; Refill: 3 - losartan-hydrochlorothiazide (HYZAAR) 100-25 MG tablet; Take 1 tablet by mouth daily.  Dispense: 90 tablet; Refill: 3 - metoprolol (TOPROL-XL) 200 MG 24 hr tablet; Take 1 tablet (200 mg total) by mouth daily.  Dispense: 90 tablet; Refill: 3  3. Hypokalemia Stable. Diagnosis pulled for medication refill. Continue current medical treatment plan. - potassium chloride (KLOR-CON M10) 10 MEQ tablet; Take 1 tablet (10 mEq total) by mouth daily.  Dispense: 90 tablet; Refill: 3  4. Hypothyroidism, unspecified type Stable. Diagnosis pulled for medication refill. Continue current medical treatment plan. - levothyroxine (SYNTHROID) 88 MCG tablet; Take 1 tablet (88 mcg total) by mouth daily.  Dispense: 90 tablet; Refill: 3  5. Hyperlipemia Stable. Diagnosis pulled for medication refill. Continue current medical treatment plan. - lovastatin (MEVACOR) 20 MG tablet; TAKE 1 TABLET BY MOUTH EVERYDAY AT BEDTIME  Dispense: 90 tablet; Refill: 3   Return in about 3 months (around 09/04/2020) for T2DM.      IMar Daring, PA-C, have  reviewed all  documentation for this visit. The documentation on 06/07/20 for the exam, diagnosis, procedures, and orders are all accurate and complete.   Rubye Beach  [facility] [phone] (phone) [phone] (fax)  [place]

## 2020-06-04 ENCOUNTER — Ambulatory Visit (INDEPENDENT_AMBULATORY_CARE_PROVIDER_SITE_OTHER): Payer: PPO | Admitting: Physician Assistant

## 2020-06-04 ENCOUNTER — Encounter: Payer: Self-pay | Admitting: Physician Assistant

## 2020-06-04 ENCOUNTER — Other Ambulatory Visit: Payer: Self-pay

## 2020-06-04 VITALS — BP 151/77 | HR 64 | Temp 97.1°F | Resp 16 | Ht 64.0 in | Wt 187.8 lb

## 2020-06-04 DIAGNOSIS — E782 Mixed hyperlipidemia: Secondary | ICD-10-CM

## 2020-06-04 DIAGNOSIS — E039 Hypothyroidism, unspecified: Secondary | ICD-10-CM

## 2020-06-04 DIAGNOSIS — E1129 Type 2 diabetes mellitus with other diabetic kidney complication: Secondary | ICD-10-CM | POA: Diagnosis not present

## 2020-06-04 DIAGNOSIS — I10 Essential (primary) hypertension: Secondary | ICD-10-CM

## 2020-06-04 DIAGNOSIS — E876 Hypokalemia: Secondary | ICD-10-CM

## 2020-06-04 LAB — POCT GLYCOSYLATED HEMOGLOBIN (HGB A1C)
Est. average glucose Bld gHb Est-mCnc: 169
Hemoglobin A1C: 7.9 % — AB (ref 4.0–5.6)

## 2020-06-04 MED ORDER — LEVOTHYROXINE SODIUM 88 MCG PO TABS: 88.0000 ug | ORAL_TABLET | Freq: Every day | ORAL | 3 refills | Status: DC

## 2020-06-04 MED ORDER — GLIPIZIDE 10 MG PO TABS: 10.0000 mg | ORAL_TABLET | Freq: Every day | ORAL | 0 refills | Status: DC

## 2020-06-04 MED ORDER — METOPROLOL SUCCINATE ER 200 MG PO TB24: 200.0000 mg | ORAL_TABLET | Freq: Every day | ORAL | 3 refills | Status: DC

## 2020-06-04 MED ORDER — LOSARTAN POTASSIUM-HCTZ 100-25 MG PO TABS: 1.0000 | ORAL_TABLET | Freq: Every day | ORAL | 3 refills | Status: DC

## 2020-06-04 MED ORDER — POTASSIUM CHLORIDE CRYS ER 10 MEQ PO TBCR: 10.0000 meq | EXTENDED_RELEASE_TABLET | Freq: Every day | ORAL | 3 refills | Status: DC

## 2020-06-04 MED ORDER — DILTIAZEM HCL ER 120 MG PO CP12: 120.0000 mg | ORAL_CAPSULE | Freq: Two times a day (BID) | ORAL | 3 refills | Status: DC

## 2020-06-04 MED ORDER — LOVASTATIN 20 MG PO TABS: ORAL_TABLET | ORAL | 3 refills | Status: DC

## 2020-06-04 MED ORDER — TRULICITY 0.75 MG/0.5ML ~~LOC~~ SOAJ: 0.7500 mg | SUBCUTANEOUS | 5 refills | Status: DC

## 2020-06-04 NOTE — Patient Instructions (Signed)
Dulaglutide injection What is this medicine? DULAGLUTIDE (DOO la GLOO tide) is used to improve blood sugar control in adults with type 2 diabetes. This medicine may be used with other oral diabetes medicines. This drug may also reduce the risk of heart attack or stroke if you have type 2 diabetes and risk factors for heart disease. This medicine may be used for other purposes; ask your health care provider or pharmacist if you have questions. COMMON BRAND NAME(S): Trulicity What should I tell my health care provider before I take this medicine? They need to know if you have any of these conditions:  endocrine tumors (MEN 2) or if someone in your family had these tumors  eye disease, vision problems  history of pancreatitis  kidney disease  liver disease  stomach problems  thyroid cancer or if someone in your family had thyroid cancer  an unusual or allergic reaction to dulaglutide, other medicines, foods, dyes, or preservatives  pregnant or trying to get pregnant  breast-feeding How should I use this medicine? This medicine is for injection under the skin of your upper leg (thigh), stomach area, or upper arm. It is usually given once every week (every 7 days). You will be taught how to prepare and give this medicine. Use exactly as directed. Take your medicine at regular intervals. Do not take it more often than directed. If you use this medicine with insulin, you should inject this medicine and the insulin separately. Do not mix them together. Do not give the injections right next to each other. Change (rotate) injection sites with each injection. It is important that you put your used needles and syringes in a special sharps container. Do not put them in a trash can. If you do not have a sharps container, call your pharmacist or healthcare provider to get one. A special MedGuide will be given to you by the pharmacist with each prescription and refill. Be sure to read this information  carefully each time. This drug comes with INSTRUCTIONS FOR USE. Ask your pharmacist for directions on how to use this drug. Read the information carefully. Talk to your pharmacist or health care provider if you have questions. Talk to your pediatrician regarding the use of this medicine in children. Special care may be needed. Overdosage: If you think you have taken too much of this medicine contact a poison control center or emergency room at once. NOTE: This medicine is only for you. Do not share this medicine with others. What if I miss a dose? If you miss a dose, take it as soon as you can within 3 days after the missed dose. Then take your next dose at your regular weekly time. If it has been longer than 3 days after the missed dose, do not take the missed dose. Take the next dose at your regular time. Do not take double or extra doses. If you have questions about a missed dose, contact your health care provider for advice. What may interact with this medicine?  other medicines for diabetes Many medications may cause changes in blood sugar, these include:  alcohol containing beverages  antiviral medicines for HIV or AIDS  aspirin and aspirin-like drugs  certain medicines for blood pressure, heart disease, irregular heart beat  chromium  diuretics  female hormones, such as estrogens or progestins, birth control pills  fenofibrate  gemfibrozil  isoniazid  lanreotide  female hormones or anabolic steroids  MAOIs like Carbex, Eldepryl, Marplan, Nardil, and Parnate  medicines for weight   loss  medicines for allergies, asthma, cold, or cough  medicines for depression, anxiety, or psychotic disturbances  niacin  nicotine  NSAIDs, medicines for pain and inflammation, like ibuprofen or naproxen  octreotide  pasireotide  pentamidine  phenytoin  probenecid  quinolone antibiotics such as ciprofloxacin, levofloxacin, ofloxacin  some herbal dietary  supplements  steroid medicines such as prednisone or cortisone  sulfamethoxazole; trimethoprim  thyroid hormones Some medications can hide the warning symptoms of low blood sugar (hypoglycemia). You may need to monitor your blood sugar more closely if you are taking one of these medications. These include:  beta-blockers, often used for high blood pressure or heart problems (examples include atenolol, metoprolol, propranolol)  clonidine  guanethidine  reserpine This list may not describe all possible interactions. Give your health care provider a list of all the medicines, herbs, non-prescription drugs, or dietary supplements you use. Also tell them if you smoke, drink alcohol, or use illegal drugs. Some items may interact with your medicine. What should I watch for while using this medicine? Visit your doctor or health care professional for regular checks on your progress. Drink plenty of fluids while taking this medicine. Check with your doctor or health care professional if you get an attack of severe diarrhea, nausea, and vomiting. The loss of too much body fluid can make it dangerous for you to take this medicine. A test called the HbA1C (A1C) will be monitored. This is a simple blood test. It measures your blood sugar control over the last 2 to 3 months. You will receive this test every 3 to 6 months. Learn how to check your blood sugar. Learn the symptoms of low and high blood sugar and how to manage them. Always carry a quick-source of sugar with you in case you have symptoms of low blood sugar. Examples include hard sugar candy or glucose tablets. Make sure others know that you can choke if you eat or drink when you develop serious symptoms of low blood sugar, such as seizures or unconsciousness. They must get medical help at once. Tell your doctor or health care professional if you have high blood sugar. You might need to change the dose of your medicine. If you are sick or  exercising more than usual, you might need to change the dose of your medicine. Do not skip meals. Ask your doctor or health care professional if you should avoid alcohol. Many nonprescription cough and cold products contain sugar or alcohol. These can affect blood sugar. Pens should never be shared. Even if the needle is changed, sharing may result in passing of viruses like hepatitis or HIV. Wear a medical ID bracelet or chain, and carry a card that describes your disease and details of your medicine and dosage times. What side effects may I notice from receiving this medicine? Side effects that you should report to your doctor or health care professional as soon as possible:  allergic reactions like skin rash, itching or hives, swelling of the face, lips, or tongue  breathing problems  changes in vision  diarrhea that continues or is severe  lump or swelling on the neck  severe nausea  signs and symptoms of infection like fever or chills; cough; sore throat; pain or trouble passing urine  signs and symptoms of low blood sugar such as feeling anxious, confusion, dizziness, increased hunger, unusually weak or tired, sweating, shakiness, cold, irritable, headache, blurred vision, fast heartbeat, loss of consciousness  signs and symptoms of kidney injury like trouble passing   urine or change in the amount of urine  trouble swallowing  unusual stomach upset or pain  vomiting Side effects that usually do not require medical attention (report to your doctor or health care professional if they continue or are bothersome):  diarrhea  loss of appetite  nausea  pain, redness, or irritation at site where injected  stomach upset This list may not describe all possible side effects. Call your doctor for medical advice about side effects. You may report side effects to FDA at 1-800-FDA-1088. Where should I keep my medicine? Keep out of the reach of children. Store unopened pens in a  refrigerator between 2 and 8 degrees C (36 and 46 degrees F). Do not freeze or use if the medicine has been frozen. Protect from light and excessive heat. Store in the carton until use. Each single-dose pen can be kept at room temperature, not to exceed 30 degrees C (86 degrees F) for a total of 14 days, if needed. Throw away any unused medicine after the expiration date on the label. NOTE: This sheet is a summary. It may not cover all possible information. If you have questions about this medicine, talk to your doctor, pharmacist, or health care provider.  2020 Elsevier/Gold Standard (2019-08-20 09:34:53)  

## 2020-06-10 ENCOUNTER — Encounter: Payer: Self-pay | Admitting: Physician Assistant

## 2020-06-10 DIAGNOSIS — I10 Essential (primary) hypertension: Secondary | ICD-10-CM

## 2020-06-12 ENCOUNTER — Telehealth: Payer: Self-pay | Admitting: *Deleted

## 2020-06-12 NOTE — Telephone Encounter (Signed)
(  06/12/2020) Pt has been notified that lung cancer screening CT scan is due currently or will be in near future. Confirmed pt is within appropriate age range, and asymptomatic. Pt denies illness that would prevent curative treatment for lung cancer if found. Verified smoking history (Current Smoker, 2 ppd ). Pt did receive 2nd COVID VX on (02/18/2020) [CT to be scheduled approx. 4 weeks after vx date] Pt is agreeable for CT scan being scheduled, however she is caring for her grandchildren at the moment and would like to wait and schedule something after they are back in school when her schedule is more available. SRW

## 2020-06-12 NOTE — Telephone Encounter (Signed)
Please give samples if we have any.  Also see if she will accept referral to [place], who would help with getting her financial assistance for medications.

## 2020-06-13 ENCOUNTER — Other Ambulatory Visit: Payer: Self-pay | Admitting: Physician Assistant

## 2020-06-13 DIAGNOSIS — I10 Essential (primary) hypertension: Secondary | ICD-10-CM

## 2020-06-15 ENCOUNTER — Telehealth: Payer: Self-pay | Admitting: Physician Assistant

## 2020-06-15 MED ORDER — DILTIAZEM HCL ER 120 MG PO CP12: 120.0000 mg | ORAL_CAPSULE | Freq: Two times a day (BID) | ORAL | 3 refills | Status: DC

## 2020-06-15 NOTE — Telephone Encounter (Signed)
Patient dropped off  [employer] forms for [employer] to fill out. Forms were placed in Jenni's box.  Please call patient when completed and ready to be picked up.  Thanks, [employer]

## 2020-06-15 NOTE — Telephone Encounter (Signed)
Patient advised to come pick up samples of Trulity 2 boxes.

## 2020-06-17 NOTE — Telephone Encounter (Signed)
Forms completed and will be faxed tomorrow

## 2020-06-18 NOTE — Telephone Encounter (Signed)
Given to front desk Anderson Malta) to fax

## 2020-06-19 ENCOUNTER — Ambulatory Visit: Payer: PPO | Admitting: Pharmacist

## 2020-06-19 ENCOUNTER — Other Ambulatory Visit: Payer: Self-pay

## 2020-06-19 DIAGNOSIS — E782 Mixed hyperlipidemia: Secondary | ICD-10-CM

## 2020-06-19 DIAGNOSIS — E1129 Type 2 diabetes mellitus with other diabetic kidney complication: Secondary | ICD-10-CM

## 2020-06-19 NOTE — Chronic Care Management (AMB) (Signed)
Chronic Care Management Pharmacy  Name: MIABELLA SHANNAHAN  MRN: [id number] DOB: 1951/01/06  Chief Complaint/ HPI  Heather Burke,  69 y.o. , female presents for their Initial CCM visit with the clinical pharmacist via telephone due to COVID-19 Pandemic.  PCP : [name], PA-C  Their chronic conditions include: HTN, HLD, DM, hypothyroidism  Office Visits: 6/17 DM, [place], BP 151/77 P 64 Wt 188 BMI 32.2, d/c Invokana, start Trulicity 06/00 AWV, [place], BP 150/83 P 83 Wt 185 BMI 33.8  Consult Visit:  Medications: Outpatient Encounter Medications as of 06/19/2020  Medication Sig  . aspirin 81 MG tablet Take 81 mg by mouth daily.  . calcium carbonate (OS-CAL) 600 MG TABS tablet Take 1 tablet (600 mg total) by mouth 2 (two) times daily with a meal.  . CANNABIDIOL PO Take 500 mg by mouth daily.  . Cholecalciferol (D3 ADULT PO) Take 1 tablet by mouth daily. 1000 units daily  . diltiazem (CARDIZEM SR) 120 MG 12 hr capsule Take 1 capsule (120 mg total) by mouth 2 (two) times daily.  . Dulaglutide (TRULICITY) 4.59 XH/7.4FS SOPN Inject 0.5 mLs (0.75 mg total) into the skin once a week.  Marland Kitchen FIBER PO Take by mouth daily.   . fluticasone (FLONASE) 50 MCG/ACT nasal spray SPRAY 2 SPRAYS INTO EACH NOSTRIL EVERY DAY  . glipiZIDE (GLUCOTROL) 10 MG tablet Take 1 tablet (10 mg total) by mouth daily before breakfast.  . levothyroxine (SYNTHROID) 88 MCG tablet Take 1 tablet (88 mcg total) by mouth daily.  Marland Kitchen losartan-hydrochlorothiazide (HYZAAR) 100-25 MG tablet Take 1 tablet by mouth daily.  Marland Kitchen lovastatin (MEVACOR) 20 MG tablet TAKE 1 TABLET BY MOUTH EVERYDAY AT BEDTIME  . metoprolol (TOPROL-XL) 200 MG 24 hr tablet Take 1 tablet (200 mg total) by mouth daily.  . Misc Natural Products (T-RELIEF CBD+13 SL) Place 500 mg under the tongue daily.  . Nutritional Supplements (QUINOA/KALE/HEMP PO) Place 750 mg under the tongue.  . Omega-3 Fatty Acids (FISH OIL) 1200 MG CAPS Take by mouth daily.  2400 qam 1200 mg qpm  . potassium chloride (KLOR-CON M10) 10 MEQ tablet Take 1 tablet (10 mEq total) by mouth daily.  . [DISCONTINUED] canagliflozin (INVOKANA) 300 MG TABS tablet Take 1 tablet (300 mg total) by mouth daily before breakfast.   No facility-administered encounter medications on file as of 06/19/2020.      Financial Resource Strain: High Risk  . Difficulty of Paying Living Expenses: Hard    Current Diagnosis/Assessment:  Goals Addressed            This Visit's Progress   . Chronic Care Management       CARE PLAN ENTRY (see longitudinal plan of care for additional care plan information)  Current Barriers:  . Chronic Disease Management support, education, and care coordination needs related to Hypertension, Hyperlipidemia, and Diabetes   Hypertension BP Readings from Last 3 Encounters:  06/04/20 (!) 151/77  12/04/19 (!) 150/83  09/03/19 (!) 183/83   . Pharmacist Clinical Goal(s): o Over the next 90 days, patient will work with PharmD and providers to achieve BP goal <130/80 . Current regimen:  o Losartan-hydrochlorothiazide 100/71m daily o Toprol XL 2045mdaily . Interventions: o Recommended Jardiance increase in diabetes goals . Patient self care activities - Over the next 90 days, patient will: o Check BP daily, document, and provide at future appointments o Ensure daily salt intake < 2300 mg/day  Hyperlipidemia Lab Results  Component Value Date/Time   LDLCALC  95 12/04/2019 10:11 AM   . Pharmacist Clinical Goal(s): o Over the next 90 days, patient will work with PharmD and providers to achieve LDL goal < 70 . Current regimen:  o Lovastatin 44m at bedtime . Interventions: o Stop lovastatin o Start rosuvastatin 287mdaily . Patient self care activities - Over the next 90 days, patient will: o Shift focus from stockpile of lovastatin to cardiovascular benefits of changing to Crestor o St[employer]ero [employer]ys soon as possible  Diabetes Lab Results    Component Value Date/Time   HGBA1C 7.9 (A) 06/04/2020 09:29 AM   HGBA1C 6.9 (H) 12/04/2019 10:11 AM   HGBA1C 7.5 (A) 09/03/2019 02:28 PM   HGBA1C 6.8 (H) 09/18/2018 09:12 AM   . Pharmacist Clinical Goal(s): o Over the next 90 days, patient will work with PharmD and providers to achieve A1c goal <7% . Current regimen:  o Glipizide 1018maily o Jardiance 7m60AVily o Trulicity 0.74.09WJject weekly . Interventions: o Increase Jardiance 16m11mily o Restart metformin 500mg87mce daily with food . Patient self care activities - Over the next 90 days, patient will: o Purchase Relion blood glucose meter and strips o Check blood sugar once daily, document, and provide at future appointments  Medication management . Pharmacist Clinical Goal(s): o Over the next 90 days, patient will work with PharmD and providers to achieve optimal medication adherence . [facility] . Interventions o Comprehensive medication review performed. o Continue current medication management strategy . Patient self care activities - Over the next 90 days, patient will: o Focus on medication adherence by making changes to medication regimen as soon as they are prescribed o Take medications as prescribed o Report any questions or concerns to PharmD and/or provider(s)  Initial goal documentation       Hyperlipidemia   LDL goal < 70  Lipid Panel     Component Value Date/Time   CHOL 168 12/04/2019 1011   TRIG 212 (H) 12/04/2019 1011   HDL 37 (L) 12/04/2019 1011   LDLCALC 95 12/04/2019 1011    Hepatic Function Latest Ref Rng & Units 12/04/2019 09/18/2018 01/16/2018  Total Protein 6.0 - 8.5 g/dL 7.6 7.4 7.5  Albumin 3.8 - 4.8 g/dL 4.3 4.4 3.8  AST 0 - 40 IU/L 55(H) 51(H) 29  ALT 0 - 32 IU/L 43(H) 58(H) 20  Alk Phosphatase 39 - 117 IU/L 64 54 40  Total Bilirubin 0.0 - 1.2 mg/dL 0.5 0.2 0.5     The 10-year ASCVD risk score (GoffMikey Bussingr., et al., 2013) is: 41%   Values used to calculate the  score:     Age: 45 ye76s     Sex: Female     Is Non-Hispanic African American: No     Diabetic: Yes     Tobacco smoker: Yes     Systolic Blood Pressure: 151 m191     Is BP treated: Yes     HDL Cholesterol: 37 mg/dL     Total Cholesterol: 168 mg/dL   Patient has failed these meds in past: NA Patient is currently uncontrolled on the following medications:  . MevMarland Kitchencor 20mg 47medtime  We discussed:   Maybe Crestor? Has a stockpile of Mevacor  Plan  Recommend D/c lovastatin Recommend rosuvastatin 20mg 149m by mouth daily  Hypertension   Office blood pressures are  BP Readings from Last 3 Encounters:  06/04/20 (!) 151/77  12/04/19 (!) 150/83  09/03/19 (!) 183/83    Patient has failed  these meds in the past: NA  Patient checks BP at home infrequently  Patient home BP readings are ranging: NA  We discussed: Not at goal despite Toprol XL 214m daily and other agents. May benefit from higher dose Jardiance.  Plan  Continue current medications   Diabetes   Recent Relevant Labs: Lab Results  Component Value Date/Time   HGBA1C 7.9 (A) 06/04/2020 09:29 AM   HGBA1C 6.9 (H) 12/04/2019 10:11 AM   HGBA1C 7.5 (A) 09/03/2019 02:28 PM   HGBA1C 6.8 (H) 09/18/2018 09:12 AM   MICROALBUR Negative 09/29/2015 11:26 AM    Kidney Function Lab Results  Component Value Date/Time   CREATININE 0.98 12/04/2019 10:11 AM   CREATININE 0.69 09/18/2018 09:12 AM   GFRNONAA 60 12/04/2019 10:11 AM   GFRAA 69 12/04/2019 10:11 AM   K 4.0 12/04/2019 10:11 AM   K 4.6 09/18/2018 09:12 AM   Checking BG: Never Patient is currently uncontrolled on the following medications: Jardiance, glipizide, Trulicity  Last diabetic Foot exam: No results found for: HMDIABEYEEXA  Last diabetic Eye exam: No results found for: HMDIABFOOTEX   We discussed:  Glipizide causing hypoglycemia Still has some Trulicity samples, but can't afford Right now Jardiance is affordable Possible fatigue Restart  metformin Suggested meter  Plan  D/c glipizide Patient to get Relion meter and strips and begin testing Recommend restart metformin 5069m1 tab by mouth twice daily  Order Trulicity from [employer] assistance program  Medication Management   [facility] Uses pill box? Yes Pt endorses 100% compliance Patient's grandchildren came in the door and the call ended  We discussed:  CBD oil 50061maily Completed 2.5 years tamoxifen  [facility] of [facility] and HTA statement for PAP Continue current medication management strategy  Follow up: 3 month phone visit  TedMilus HeightharmD, BCGRoachdaleTT[place]om[place]20 [phone]

## 2020-06-22 NOTE — Patient Instructions (Signed)
Visit Information  Goals Addressed            This Visit's Progress    Chronic Care Management       CARE PLAN ENTRY (see longitudinal plan of care for additional care plan information)  Current Barriers:   Chronic Disease Management support, education, and care coordination needs related to Hypertension, Hyperlipidemia, and Diabetes   Hypertension BP Readings from Last 3 Encounters:  06/04/20 (!) 151/77  12/04/19 (!) 150/83  09/03/19 (!) 183/83    Pharmacist Clinical Goal(s): o Over the next 90 days, patient will work with PharmD and providers to achieve BP goal <130/80  Current regimen:  o Losartan-hydrochlorothiazide 100/25mg  daily o Toprol XL 200mg  daily  Interventions: o Recommended Jardiance increase in diabetes goals  Patient self care activities - Over the next 90 days, patient will: o Check BP daily, document, and provide at future appointments o Ensure daily salt intake < 2300 mg/day  Hyperlipidemia Lab Results  Component Value Date/Time   LDLCALC 95 12/04/2019 10:11 AM    Pharmacist Clinical Goal(s): o Over the next 90 days, patient will work with PharmD and providers to achieve LDL goal < 70  Current regimen:  o Lovastatin 20mg  at bedtime  Interventions: o Stop lovastatin o Start rosuvastatin 20mg  daily  Patient self care activities - Over the next 90 days, patient will: o Shift focus from stockpile of lovastatin to cardiovascular benefits of changing to [employer] as soon as possible  Diabetes Lab Results  Component Value Date/Time   HGBA1C 7.9 (A) 06/04/2020 09:29 AM   HGBA1C 6.9 (H) 12/04/2019 10:11 AM   HGBA1C 7.5 (A) 09/03/2019 02:28 PM   HGBA1C 6.8 (H) 09/18/2018 09:12 AM    Pharmacist Clinical Goal(s): o Over the next 90 days, patient will work with PharmD and providers to achieve A1c goal <7%  Current regimen:  o Glipizide 10mg  daily o Jardiance 10mg  daily o Trulicity 0.75mg  inject  weekly  Interventions: o Increase Jardiance 25mg  daily o Restart metformin 500mg  twice daily with food  Patient self care activities - Over the next 90 days, patient will: o Purchase Relion blood glucose meter and strips o Check blood sugar once daily, document, and provide at future appointments  Medication management  Pharmacist Clinical Goal(s): o Over the next 90 days, patient will work with PharmD and providers to achieve optimal medication adherence  [facility]  Interventions o Comprehensive medication review performed. o Continue current medication management strategy  Patient self care activities - Over the next 90 days, patient will: o Focus on medication adherence by making changes to medication regimen as soon as they are prescribed o Take medications as prescribed o Report any questions or concerns to PharmD and/or provider(s)  Initial goal documentation        Ms. Reach was given information about Chronic Care Management services today including:  1. CCM service includes personalized support from designated clinical staff supervised by her physician, including individualized plan of care and coordination with other care providers 2. 24/7 contact phone numbers for assistance for urgent and routine care needs. 3. Standard insurance, coinsurance, copays and deductibles apply for chronic care management only during months in which we provide at least 20 minutes of these services. Most insurances cover these services at 100%, however patients may be responsible for any copay, coinsurance and/or deductible if applicable. This service may help you avoid the need for more expensive face-to-face services. 4. Only one practitioner may furnish and  bill the service in a calendar month. 5. The patient may stop CCM services at any time (effective at the end of the month) by phone call to the office staff.  Patient agreed to services and verbal consent obtained.    Print copy of patient instructions provided.  Telephone follow up appointment with pharmacy team member scheduled for: 3 months  Milus Height, PharmD, [place], [place] [phone]

## 2020-06-23 ENCOUNTER — Other Ambulatory Visit: Payer: Self-pay | Admitting: Physician Assistant

## 2020-06-23 DIAGNOSIS — H25813 Combined forms of age-related cataract, bilateral: Secondary | ICD-10-CM | POA: Diagnosis not present

## 2020-06-23 DIAGNOSIS — E1129 Type 2 diabetes mellitus with other diabetic kidney complication: Secondary | ICD-10-CM

## 2020-06-23 DIAGNOSIS — E7800 Pure hypercholesterolemia, unspecified: Secondary | ICD-10-CM

## 2020-06-23 DIAGNOSIS — E113393 Type 2 diabetes mellitus with moderate nonproliferative diabetic retinopathy without macular edema, bilateral: Secondary | ICD-10-CM | POA: Diagnosis not present

## 2020-06-23 MED ORDER — ROSUVASTATIN CALCIUM 20 MG PO TABS: 20.0000 mg | ORAL_TABLET | Freq: Every day | ORAL | 3 refills | Status: DC

## 2020-06-23 MED ORDER — EMPAGLIFLOZIN 25 MG PO TABS: 25.0000 mg | ORAL_TABLET | Freq: Every day | ORAL | 3 refills | Status: DC

## 2020-06-23 MED ORDER — TRULICITY 1.5 MG/0.5ML ~~LOC~~ SOAJ: 1.5000 mg | SUBCUTANEOUS | 5 refills | Status: DC

## 2020-06-23 NOTE — Progress Notes (Signed)
Medication changes made per [facility] request after CCM visit.

## 2020-06-26 ENCOUNTER — Encounter: Payer: Self-pay | Admitting: Physician Assistant

## 2020-06-26 NOTE — Telephone Encounter (Signed)
Patient is going to pick up forms because she needs to attach other forms with it and is going to come by today to pick up a sample of the Trulicity

## 2020-06-26 NOTE — Telephone Encounter (Signed)
I think we faxed these last week. Can we confirm date of fax? Also can we see if we have one box samples of trulicity 1.5mg  please? If so, she can come pick up. We just have to call her to let her know we have one.   Thanks!

## 2020-08-12 ENCOUNTER — Other Ambulatory Visit: Payer: Self-pay | Admitting: Physician Assistant

## 2020-08-12 DIAGNOSIS — E1129 Type 2 diabetes mellitus with other diabetic kidney complication: Secondary | ICD-10-CM

## 2020-08-14 ENCOUNTER — Telehealth: Payer: Self-pay | Admitting: *Deleted

## 2020-08-14 NOTE — Telephone Encounter (Signed)
I called to do her annual Lung Cancer Screening. She was driving and said she would like to be called back next week.

## 2020-08-29 ENCOUNTER — Telehealth: Payer: Self-pay

## 2020-08-29 NOTE — Telephone Encounter (Signed)
Message left notifying patient that it is time to schedule the low dose lung cancer screening CT scan.  Instructed patient to return call to Shawn Perkins at [phone] to verify information prior to CT scan being scheduled.    

## 2020-09-07 ENCOUNTER — Ambulatory Visit (INDEPENDENT_AMBULATORY_CARE_PROVIDER_SITE_OTHER): Payer: PPO | Admitting: Physician Assistant

## 2020-09-07 ENCOUNTER — Other Ambulatory Visit: Payer: Self-pay

## 2020-09-07 ENCOUNTER — Encounter: Payer: Self-pay | Admitting: Physician Assistant

## 2020-09-07 VITALS — BP 158/69 | HR 63 | Temp 97.8°F | Resp 16 | Ht 63.0 in | Wt 186.8 lb

## 2020-09-07 DIAGNOSIS — E1129 Type 2 diabetes mellitus with other diabetic kidney complication: Secondary | ICD-10-CM | POA: Diagnosis not present

## 2020-09-07 DIAGNOSIS — E1169 Type 2 diabetes mellitus with other specified complication: Secondary | ICD-10-CM | POA: Diagnosis not present

## 2020-09-07 DIAGNOSIS — E7800 Pure hypercholesterolemia, unspecified: Secondary | ICD-10-CM | POA: Diagnosis not present

## 2020-09-07 DIAGNOSIS — I10 Essential (primary) hypertension: Secondary | ICD-10-CM

## 2020-09-07 LAB — POCT GLYCOSYLATED HEMOGLOBIN (HGB A1C)
Est. average glucose Bld gHb Est-mCnc: 137
Hemoglobin A1C: 6.4 % — AB (ref 4.0–5.6)

## 2020-09-07 MED ORDER — ONETOUCH ULTRASOFT LANCETS MISC: 12 refills | Status: DC

## 2020-09-07 MED ORDER — ONETOUCH VERIO VI STRP: ORAL_STRIP | 12 refills | Status: DC

## 2020-09-07 MED ORDER — CLONIDINE HCL 0.1 MG PO TABS: 0.1000 mg | ORAL_TABLET | Freq: Every day | ORAL | 0 refills | Status: DC

## 2020-09-07 MED ORDER — ONETOUCH VERIO W/DEVICE KIT: PACK | 0 refills | Status: DC

## 2020-09-07 MED ORDER — LOVASTATIN 20 MG PO TABS: 20.0000 mg | ORAL_TABLET | Freq: Every day | ORAL | 3 refills | Status: DC

## 2020-09-07 NOTE — Patient Instructions (Signed)
Clonidine tablets What is this medicine? CLONIDINE (KLOE ni deen) is used to treat high blood pressure. This medicine may be used for other purposes; ask your health care provider or pharmacist if you have questions. COMMON BRAND NAME(S): Catapres What should I tell my health care provider before I take this medicine? They need to know if you have any of these conditions:  kidney disease  an unusual or allergic reaction to clonidine, other medicines, foods, dyes, or preservatives  pregnant or trying to get pregnant  breast-feeding How should I use this medicine? Take this medicine by mouth with a glass of water. Follow the directions on the prescription label. Take your doses at regular intervals. Do not take your medicine more often than directed. Do not suddenly stop taking this medicine. You must gradually reduce the dose or you may get a dangerous increase in blood pressure. Ask your doctor or health care professional for advice. Talk to your pediatrician regarding the use of this medicine in children. Special care may be needed. Overdosage: If you think you have taken too much of this medicine contact a poison control center or emergency room at once. NOTE: This medicine is only for you. Do not share this medicine with others. What if I miss a dose? If you miss a dose, take it as soon as you can. If it is almost time for your next dose, take only that dose. Do not take double or extra doses. What may interact with this medicine? Do not take this medicine with any of the following medications:  MAOIs like Carbex, Eldepryl, Marplan, Nardil, and Parnate This medicine may also interact with the following medications:  barbiturate medicines for inducing sleep or treating seizures like phenobarbital  certain medicines for blood pressure, heart disease, irregular heart beat  certain medicines for depression, anxiety, or psychotic disturbances  prescription pain medicines This list  may not describe all possible interactions. Give your health care provider a list of all the medicines, herbs, non-prescription drugs, or dietary supplements you use. Also tell them if you smoke, drink alcohol, or use illegal drugs. Some items may interact with your medicine. What should I watch for while using this medicine? Visit your doctor or health care professional for regular checks on your progress. Check your heart rate and blood pressure regularly while you are taking this medicine. Ask your doctor or health care professional what your heart rate should be and when you should contact him or her. You may get drowsy or dizzy. Do not drive, use machinery, or do anything that needs mental alertness until you know how this medicine affects you. To avoid dizzy or fainting spells, do not stand or sit up quickly, especially if you are an older person. Alcohol can make you more drowsy and dizzy. Avoid alcoholic drinks. Your mouth may get dry. Chewing sugarless gum or sucking hard candy, and drinking plenty of water will help. Do not treat yourself for coughs, colds, or pain while you are taking this medicine without asking your doctor or health care professional for advice. Some ingredients may increase your blood pressure. If you are going to have surgery tell your doctor or health care professional that you are taking this medicine. What side effects may I notice from receiving this medicine? Side effects that you should report to your doctor or health care professional as soon as possible:  allergic reactions like skin rash, itching or hives, swelling of the face, lips, or tongue  anxiety, nervousness  chest pain  depression  fast, irregular heartbeat  swelling of feet or legs  unusually weak or tired Side effects that usually do not require medical attention (report to your doctor or health care professional if they continue or are bothersome):  change in sex drive or  performance  constipation  headache This list may not describe all possible side effects. Call your doctor for medical advice about side effects. You may report side effects to FDA at 1-800-FDA-1088. Where should I keep my medicine? Keep out of the reach of children. Store at room temperature between 15 and 30 degrees C (59 and 86 degrees F). Protect from light. Keep container tightly closed. Throw away any unused medicine after the expiration date. NOTE: This sheet is a summary. It may not cover all possible information. If you have questions about this medicine, talk to your doctor, pharmacist, or health care provider.  2020 [employer]vier/Gold Standard (2011-06-01 13:01:28)

## 2020-09-07 NOTE — Progress Notes (Signed)
Established patient visit   Patient: Heather Burke   DOB: [birth year]   69 y.o. Female  MRN: [id number] Visit Date: 09/07/2020  Today's healthcare provider: [name], PA-C   Chief Complaint  Patient presents with   Follow-up   Subjective    HPI Patient here for follow up T2DM. Reports that she is not taking the Jardiance and Crestor but she is taking the Glipizide, Trulicity and Lovastatin. She wants to talk to provider about this.  Reports that she would like to wait for the Influenza Vaccination until the end of October.  Diabetes Mellitus Type II, Follow-up  Lab Results  Component Value Date   HGBA1C 6.4 (A) 09/07/2020   HGBA1C 7.9 (A) 06/04/2020   HGBA1C 6.9 (H) 12/04/2019   Wt Readings from Last 3 Encounters:  09/07/20 186 lb 12.8 oz (84.7 kg)  06/04/20 187 lb 12.8 oz (85.2 kg)  12/04/19 184 lb 12.8 oz (83.8 kg)   Last seen for diabetes 3 months ago.  Management since then includes stop Invokana and start Trulicity. She reports excellent compliance with treatment. She is not having side effects.  Symptoms: Yes fatigue No foot ulcerations  No appetite changes No nausea  No paresthesia of the feet  No polydipsia  No polyuria No visual disturbances   No vomiting     Home blood sugar records: not being checked  Episodes of hypoglycemia? No not being checked   Current insulin regiment: None   Pertinent Labs: Lab Results  Component Value Date   CHOL 168 12/04/2019   HDL 37 (L) 12/04/2019   LDLCALC 95 12/04/2019   TRIG 212 (H) 12/04/2019   CHOLHDL 4.5 (H) 12/04/2019   Lab Results  Component Value Date   NA 138 12/04/2019   K 4.0 12/04/2019   CREATININE 0.98 12/04/2019   GFRNONAA 60 12/04/2019   GFRAA 69 12/04/2019   GLUCOSE 150 (H) 12/04/2019     ---------------------------------------------------------------------------------------------------      Medications: Outpatient Medications Prior to Visit  Medication Sig   aspirin  81 MG tablet Take 81 mg by mouth daily.   calcium carbonate (OS-CAL) 600 MG TABS tablet Take 1 tablet (600 mg total) by mouth 2 (two) times daily with a meal.   CANNABIDIOL PO Take 500 mg by mouth daily.   Cholecalciferol (D3 ADULT PO) Take 1 tablet by mouth daily. 1000 units daily   diltiazem (CARDIZEM SR) 120 MG 12 hr capsule Take 1 capsule (120 mg total) by mouth 2 (two) times daily.   Dulaglutide (TRULICITY) 1.5 VK/1.2AE SOPN Inject 0.5 mLs (1.5 mg total) into the skin once a week.   FIBER PO Take by mouth daily.    fluticasone (FLONASE) 50 MCG/ACT nasal spray SPRAY 2 SPRAYS INTO EACH NOSTRIL EVERY DAY   levothyroxine (SYNTHROID) 88 MCG tablet Take 1 tablet (88 mcg total) by mouth daily.   losartan-hydrochlorothiazide (HYZAAR) 100-25 MG tablet Take 1 tablet by mouth daily.   metoprolol (TOPROL-XL) 200 MG 24 hr tablet Take 1 tablet (200 mg total) by mouth daily.   Misc Natural Products (T-RELIEF CBD+13 SL) Place 500 mg under the tongue daily.   Nutritional Supplements (QUINOA/KALE/HEMP PO) Place 750 mg under the tongue.   Omega-3 Fatty Acids (FISH OIL) 1200 MG CAPS Take by mouth daily. 2400 qam 1200 mg qpm   potassium chloride (KLOR-CON M10) 10 MEQ tablet Take 1 tablet (10 mEq total) by mouth daily.   SHINGRIX injection    empagliflozin (JARDIANCE) 25 MG  TABS tablet Take 1 tablet (25 mg total) by mouth daily before breakfast. (Patient not taking: Reported on 09/07/2020)   rosuvastatin (CRESTOR) 20 MG tablet Take 1 tablet (20 mg total) by mouth daily. (Patient not taking: Reported on 09/07/2020)   No facility-administered medications prior to visit.    Review of Systems  Constitutional: Positive for fatigue. Negative for appetite change, chills and fever.  Respiratory: Negative for chest tightness and shortness of breath.   Cardiovascular: Negative for chest pain and palpitations.  Gastrointestinal: Negative for abdominal pain, nausea and vomiting.  Endocrine: Negative for  polydipsia, polyphagia and polyuria.  Neurological: Negative for dizziness and weakness.       Objective    BP (!) 158/69 (BP Location: Right Arm, Patient Position: Sitting, Cuff Size: Large)    Pulse 63    Temp 97.8 F (36.6 C) (Oral)    Resp 16    Ht 5' 3"  (1.6 m)    Wt 186 lb 12.8 oz (84.7 kg)    SpO2 99%    BMI 33.09 kg/m     Physical Exam Vitals reviewed.  Constitutional:      General: She is not in acute distress.    Appearance: Normal appearance. She is well-developed. She is obese. She is not ill-appearing or diaphoretic.  Cardiovascular:     Rate and Rhythm: Normal rate and regular rhythm.     Pulses: Normal pulses.     Heart sounds: Normal heart sounds. No murmur heard.  No friction rub. No gallop.   Pulmonary:     Effort: Pulmonary effort is normal. No respiratory distress.     Breath sounds: Normal breath sounds. No wheezing or rales.  Musculoskeletal:     Cervical back: Normal range of motion and neck supple.  Neurological:     Mental Status: She is alert.  Psychiatric:        Mood and Affect: Mood normal.        Thought Content: Thought content normal.      Results for orders placed or performed in visit on 09/07/20  POCT glycosylated hemoglobin (Hb A1C)  Result Value Ref Range   Hemoglobin A1C 6.4 (A) 4.0 - 5.6 %   Est. average glucose Bld gHb Est-mCnc 137     Assessment & Plan     1. Type 2 diabetes mellitus with hypercholesterolemia (HCC) A1c much improved from 7.9 to 6.4. Will continue Trulicity 0.9FG SQ weekly. OK to stop Glipizide to avoid hypoglycemic episodes. Monitoring kit sent to [facility] for blood sugar checks since she has not been doing that. Continue Lovastatin 14m. F/U in 3 months. - lovastatin (MEVACOR) 20 MG tablet; Take 1 tablet (20 mg total) by mouth at bedtime.  Dispense: 90 tablet; Refill: 3 - Lancets (ONETOUCH ULTRASOFT) lancets; To check blood sugar daily  Dispense: 100 each; Refill: 12 - glucose blood (ONETOUCH VERIO) test strip; To  check blood sugar daily  Dispense: 100 each; Refill: 12 - Blood Glucose Monitoring Suppl (ONETOUCH VERIO) w/Device KIT; To check blood sugar daily  Dispense: 1 kit; Refill: 0  2. Type 2 diabetes mellitus with other diabetic kidney complication ([place] See above medical treatment plan. On an ARB.  3. Essential hypertension Elevated today and has been last couple visits. Continue Losartan-HCTZ 100-236m Metoprolol XR 20054mand Diltiazem SR 120m43mD. Add clonidine at bedtime. F/U in 3 months. - cloNIDine (CATAPRES) 0.1 MG tablet; Take 1 tablet (0.1 mg total) by mouth at bedtime.  Dispense: 90 tablet; Refill: 0  No follow-ups on file.      Reynolds Bowl, PA-C, have reviewed all documentation for this visit. The documentation on 09/07/20 for the exam, diagnosis, procedures, and orders are all accurate and complete.   Rubye Beach  [facility] [phone] (phone) [phone] (fax)  [place]

## 2020-09-10 ENCOUNTER — Other Ambulatory Visit: Payer: Self-pay

## 2020-09-10 ENCOUNTER — Ambulatory Visit (INDEPENDENT_AMBULATORY_CARE_PROVIDER_SITE_OTHER): Payer: Medicare Other

## 2020-09-10 DIAGNOSIS — Z23 Encounter for immunization: Secondary | ICD-10-CM

## 2020-09-21 ENCOUNTER — Encounter: Payer: Self-pay | Admitting: Physician Assistant

## 2020-09-21 NOTE — Telephone Encounter (Signed)
Clonidine causing constipation

## 2020-09-24 ENCOUNTER — Telehealth: Payer: Self-pay | Admitting: *Deleted

## 2020-09-24 ENCOUNTER — Encounter: Payer: Self-pay | Admitting: *Deleted

## 2020-09-24 NOTE — Telephone Encounter (Signed)
Attempted to contact and schedule lung screening scan. Message left for patient to call back to schedule. 

## 2020-10-09 ENCOUNTER — Telehealth: Payer: Self-pay | Admitting: Pharmacist

## 2020-10-09 NOTE — Progress Notes (Signed)
Reminder call made to patient. Patient requests to be removed from the program as she goes over her medication with Fenton Malling and does not see the benefit of the program. Communicated request to [place] D.

## 2020-10-12 ENCOUNTER — Telehealth: Payer: Self-pay

## 2020-10-29 ENCOUNTER — Other Ambulatory Visit: Payer: Self-pay | Admitting: Physician Assistant

## 2020-10-29 DIAGNOSIS — I10 Essential (primary) hypertension: Secondary | ICD-10-CM

## 2020-11-07 ENCOUNTER — Other Ambulatory Visit: Payer: Self-pay | Admitting: Physician Assistant

## 2020-11-07 DIAGNOSIS — J302 Other seasonal allergic rhinitis: Secondary | ICD-10-CM

## 2020-11-08 ENCOUNTER — Telehealth: Payer: Self-pay

## 2020-11-08 NOTE — Telephone Encounter (Signed)
Attempted to contact pt to set up lung screening CT appt, but unable to reach her on both phone or mobile number. Detailed message left on mobile phone.

## 2020-12-03 ENCOUNTER — Other Ambulatory Visit: Payer: Self-pay | Admitting: Physician Assistant

## 2020-12-03 DIAGNOSIS — I10 Essential (primary) hypertension: Secondary | ICD-10-CM

## 2020-12-09 NOTE — Progress Notes (Signed)
Subjective:   Heather Burke is a 68 y.o. female who presents for Medicare Annual (Subsequent) preventive examination.  I connected with Mort Sawyers today by telephone and verified that I am speaking with the correct person using two identifiers. Location patient: home Location provider: work Persons participating in the virtual visit: patient, provider.   I discussed the limitations, risks, security and privacy concerns of performing an evaluation and management service by telephone and the availability of in person appointments. I also discussed with the patient that there may be a patient responsible charge related to this service. The patient expressed understanding and verbally consented to this telephonic visit.    Interactive audio and video telecommunications were attempted between this provider and patient, however failed, due to patient having technical difficulties OR patient did not have access to video capability.  We continued and completed visit with audio only.   Review of Systems    N/A  Cardiac Risk Factors include: advanced age (>74mn, >>27women);diabetes mellitus;dyslipidemia;hypertension;smoking/ tobacco exposure;sedentary lifestyle;obesity (BMI >30kg/m2)     Objective:    There were no vitals filed for this visit. There is no height or weight on file to calculate BMI.  Advanced Directives 12/14/2020 08/23/2018 01/16/2018 07/21/2017 07/07/2017 12/22/2016 08/25/2016  Does Patient Have a Medical Advance Directive? No Yes _0   Type of Advance Directive - [place] Chart? - No - copy requested - - - - -  Would patient like information on creating a medical advance directive? No - Patient declined - - No - Patient declined - - -    Current Medications (verified) Outpatient Encounter Medications as of 12/14/2020  Medication Sig  . aspirin 81 MG tablet Take 81 mg by mouth daily.  .  Blood Glucose Monitoring Suppl (ONETOUCH VERIO) w/Device KIT To check blood sugar daily  . calcium carbonate (OS-CAL) 600 MG TABS tablet Take 1 tablet (600 mg total) by mouth 2 (two) times daily with a meal.  . Cholecalciferol (D3 ADULT PO) Take 1 tablet by mouth daily. 1000 units daily  . diltiazem (CARDIZEM SR) 120 MG 12 hr capsule TAKE 1 CAPSULE BY MOUTH 2 TIMES DAILY.  . Dulaglutide (TRULICITY) 1.5 MEB/3.4DHSOPN Inject 0.5 mLs (1.5 mg total) into the skin once a week.  .Marland KitchenFIBER PO Take by mouth 2 (two) times daily.  . fluticasone (FLONASE) 50 MCG/ACT nasal spray SPRAY 2 SPRAYS INTO EACH NOSTRIL EVERY DAY  . glucose blood (ONETOUCH VERIO) test strip To check blood sugar daily  . Lancets (ONETOUCH ULTRASOFT) lancets To check blood sugar daily  . levothyroxine (SYNTHROID) 88 MCG tablet Take 1 tablet (88 mcg total) by mouth daily.  .Marland Kitchenlosartan-hydrochlorothiazide (HYZAAR) 100-25 MG tablet Take 1 tablet by mouth daily.  .Marland Kitchenlovastatin (MEVACOR) 20 MG tablet Take 1 tablet (20 mg total) by mouth at bedtime.  . metoprolol (TOPROL-XL) 200 MG 24 hr tablet Take 1 tablet (200 mg total) by mouth daily.  . Misc Natural Products (T-RELIEF CBD+13 SL) Place 500 mg under the tongue daily.  . Omega-3 Fatty Acids (FISH OIL) 1200 MG CAPS Take by mouth daily. 2400 qam 1200 mg qpm  . potassium chloride (KLOR-CON M10) 10 MEQ tablet Take 1 tablet (10 mEq total) by mouth daily.  .Marland KitchenCANNABIDIOL PO Take 500 mg by mouth daily. (Patient not taking: Reported on 12/14/2020)  . Nutritional Supplements (QUINOA/KALE/HEMP PO) Place 750 mg under  the tongue. (Patient not taking: Reported on 12/14/2020)  . SHINGRIX injection  (Patient not taking: Reported on 12/14/2020)  . [DISCONTINUED] canagliflozin (INVOKANA) 300 MG TABS tablet Take 1 tablet (300 mg total) by mouth daily before breakfast.   No facility-administered encounter medications on file as of 12/14/2020.    Allergies (verified) Amlodipine, Atorvastatin, Lisinopril,  Pravastatin, and Nifedipine er   History: Past Medical History:  Diagnosis Date  . Breast cancer ([place]) 06/08/2015   T1c, N0;  ER+,PR+, Her 2 neg (FISH neg) x2., SLN x 3 negative. Mammoprint: Low risk. Multifocal.Invasive mammary and lobular carcinoma.   . Diabetes mellitus without complication ([place])   . GERD (gastroesophageal reflux disease)   . Heart murmur 2016   newly diagnosed, slight murmur  . Hyperlipidemia   . Hypertension   . Hypothyroidism   . Lupus ([place])   . PONV (postoperative nausea and vomiting)    with Hysterectomy  . Thyroid disease    Past Surgical History:  Procedure Laterality Date  . ABDOMINAL HYSTERECTOMY    . BREAST BIOPSY Right 2002   negative/ Dr. Bary Castilla  . BREAST BIOPSY Left 06-08-15   INVASIVE MAMMARY CARCINOMA WITH PARTIAL SOLID PATTERN.   Marland Kitchen BREAST BIOPSY Right 06/26/2018   FIBROADENOMATOUS CHANGES WITH STROMAL HYALINIZATION AND CALCIFICATIONS  . COLONOSCOPY WITH PROPOFOL N/A 09/13/2016   Procedure: COLONOSCOPY WITH PROPOFOL;  Surgeon: Lucilla Lame, MD;  Location: [facility] ENDOSCOPY;  Service: Endoscopy;  Laterality: N/A;  . core biopsy Left 06/08/15  . GUM SURGERY  2006  . MASTECTOMY Left 2016  . OOPHORECTOMY    . SENTINEL NODE BIOPSY Left 08/18/2015   Procedure: SENTINEL NODE BIOPSY;  Surgeon: [name], MD;  Location: [facility] ORS;  Service: General;  Laterality: Left;  . SIMPLE MASTECTOMY WITH AXILLARY SENTINEL NODE BIOPSY Left 08/18/2015   Procedure: SIMPLE MASTECTOMY;  Surgeon: [name], MD;  Location: [facility] ORS;  Service: General;  Laterality: Left;  . TONSILLECTOMY     Family History  Problem Relation Age of Onset  . Cancer Father        prostate  . Thyroid disease Daughter   . Heart attack Sister   . Breast cancer Cousin   . Breast cancer Cousin   . Breast cancer Other    Social History   Socioeconomic History  . Marital status: Widowed    Spouse name: Not on file  . Number of children: 2  . Years of education: H/S  .  Highest education level: High school graduate  Occupational History  . 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 Smoking status: Current Every Day Smoker    Packs/day: 2.00    Years: 49.00    Pack years: 98.00    Types: Cigarettes  . Smokeless tobacco: Never Used  Vaping Use  . Vaping Use: Never used  Substance and Sexual Activity  . Alcohol use: Yes    Alcohol/week: 14.0 - 28.0 standard drinks    Types: 14 - 28 Cans of beer per week    Comment: 2-3 beers a day - declined cutting back anymore  . Drug use: No  . Sexual activity: Not on file  Other Topics Concern  . Not on file  Social History Narrative  . Not on file   Social Determinants of Health   Financial Resource Strain: Low Risk   . Difficulty of Paying Living Expenses: Not very hard  Food Insecurity: No Food Insecurity  .  Worried About Charity fundraiser in the Last Year: Never true  . Ran Out of Food in the Last Year: Never true  Transportation Needs: No Transportation Needs  . Lack of Transportation (Medical): No  . Lack of Transportation (Non-Medical): No  Physical Activity: Inactive  . Days of Exercise per Week: 0 days  . Minutes of Exercise per Session: 0 min  Stress: No Stress Concern Present  . Feeling of Stress : Only a little  Social Connections: Socially Isolated  . Frequency of Communication with Friends and Family: More than three times a week  . Frequency of Social Gatherings with Friends and Family: More than three times a week  . Attends Religious Services: Never  . Active Member of Clubs or Organizations: No  . Attends Archivist Meetings: Never  . Marital Status: Widowed    Tobacco Counseling Ready to quit: No Counseling given: No   Clinical Intake:  Pre-visit preparation completed: Yes  Pain : No/denies pain     Nutritional Risks: None Diabetes: Yes  How often do you need to have someone help you when you read  instructions, pamphlets, or other written materials from your doctor or pharmacy?: 1 - Never  Diabetic? Yes  Nutrition Risk Assessment:  Has the patient had any N/V/D within the last 2 months?  No  Does the patient have any non-healing wounds?  No  Has the patient had any unintentional weight loss or weight gain?  No   Diabetes:  Is the patient diabetic?  Yes  If diabetic, was a CBG obtained today?  No  Did the patient bring in their glucometer from home?  No  How often do you monitor your CBG's? Does not check BS.   Financial Strains and Diabetes Management:  Are you having any financial strains with the device, your supplies or your medication? No .  Does the patient want to be seen by Chronic Care Management for management of their diabetes?  No  Would the patient like to be referred to a Nutritionist or for Diabetic Management?  No   Diabetic Exams:  Diabetic Eye Exam: Overdue for diabetic eye exam. Pt has been advised about the importance in completing this exam. Patient has an eye exam scheduled 12/28/20. Diabetic Foot Exam: Overdue, Pt has been advised about the importance in completing this exam. Note made to follow up on this at next in office apt.   Interpreter Needed?: No  Information entered by :: [facility], LPN   Activities of Daily Living In your present state of health, do you have any difficulty performing the following activities: 12/14/2020  Hearing? N  Vision? N  Difficulty concentrating or making decisions? N  Walking or climbing stairs? Y  Comment Due to knee pains.  Dressing or bathing? N  Doing errands, shopping? N  Preparing Food and eating ? N  Using the Toilet? N  In the past six months, have you accidently leaked urine? Y  Comment Occasionally  Do you have problems with loss of bowel control? N  Managing your Medications? N  Managing your Finances? N  Housekeeping or managing your Housekeeping? N  Some recent data might be hidden     Patient Care Team: [name], PA-C as PCP - General (Family Medicine) Lorelee Cover., MD as Consulting Physician (Ophthalmology)  Indicate any recent Medical Services you may have received from other than [facility] providers in the past year (date may be approximate).     Assessment:  This is a routine wellness examination for [facility].  Hearing/Vision screen No exam data present  Dietary issues and exercise activities discussed: Current Exercise Habits: The patient does not participate in regular exercise at present, Exercise limited by: None identified  Goals    . Chronic Care Management     CARE PLAN ENTRY (see longitudinal plan of care for additional care plan information)  Current Barriers:  . Chronic Disease Management support, education, and care coordination needs related to Hypertension, Hyperlipidemia, and Diabetes   Hypertension BP Readings from Last 3 Encounters:  06/04/20 (!) 151/77  12/04/19 (!) 150/83  09/03/19 (!) 183/83   . Pharmacist Clinical Goal(s): o Over the next 90 days, patient will work with PharmD and providers to achieve BP goal <130/80 . Current regimen:  o Losartan-hydrochlorothiazide 100/35m daily o Toprol XL 2035mdaily . Interventions: o Recommended Jardiance increase in diabetes goals . Patient self care activities - Over the next 90 days, patient will: o Check BP daily, document, and provide at future appointments o Ensure daily salt intake < 2300 mg/day  Hyperlipidemia Lab Results  Component Value Date/Time   LDLCALC 95 12/04/2019 10:11 AM   . Pharmacist Clinical Goal(s): o Over the next 90 days, patient will work with PharmD and providers to achieve LDL goal < 70 . Current regimen:  o Lovastatin 2019mt bedtime . Interventions: o Stop lovastatin o Start rosuvastatin 6m66mily . Patient self care activities - Over the next 90 days, patient will: o Shift focus from stockpile of lovastatin to cardiovascular  benefits of changing to Crestor o [employer] Energysoon as possible  Diabetes Lab Results  Component Value Date/Time   HGBA1C 7.9 (A) 06/04/2020 09:29 AM   HGBA1C 6.9 (H) 12/04/2019 10:11 AM   HGBA1C 7.5 (A) 09/03/2019 02:28 PM   HGBA1C 6.8 (H) 09/18/2018 09:12 AM   . Pharmacist Clinical Goal(s): o Over the next 90 days, patient will work with PharmD and providers to achieve A1c goal <7% . Current regimen:  o Glipizide 10mg68mly o Jardiance 10mg 53IRy o Trulicity 0.65m4.43XVct weekly . Interventions: o Increase Jardiance 25mg 51my o Restart metformin 500mg t52m daily with food . Patient self care activities - Over the next 90 days, patient will: o Purchase Relion blood glucose meter and strips o Check blood sugar once daily, document, and provide at future appointments  Medication management . Pharmacist Clinical Goal(s): o Over the next 90 days, patient will work with PharmD and providers to achieve optimal medication adherence . Current pharmacy: [facility] . Interventions o Comprehensive medication review performed. o Continue current medication management strategy . Patient self care activities - Over the next 90 days, patient will: o Focus on medication adherence by making changes to medication regimen as soon as they are prescribed o Take medications as prescribed o Report any questions or concerns to PharmD and/or provider(s)  Initial goal documentation     . Quit Smoking     Recommend to continue efforts to reduce smoking habits until no longer smoking.     . Reduce alcohol intake     Recommend to continue to cut back on alcohol consumption in diet. Recommend no more than 1 drink a day.    . Reduce sugar intake      Recommend decreasing junk food as snacks and increasing more healthy snacks.       Depression Screen PHQ 2/9 Scores 12/14/2020 06/04/2020 08/23/2018 07/21/2017 07/21/2017  PHQ - 2 Score 0 0 0 0  0    Fall Risk Fall Risk  12/14/2020 12/04/2019 11/13/2019  08/23/2018 07/21/2017  Falls in the past year? 0 0 1 No No  Comment - - Emmi Telephone Survey: data to providers prior to load - -  Number falls in past yr: 0 0 1 - -  Comment - - Emmi Telephone Survey Actual Response = 9 - -  Injury with Fall? 0 0 0 - -  Follow up - Falls evaluation completed - - -    FALL RISK PREVENTION PERTAINING TO THE HOME:  Any stairs in or around the home? Yes  If so, are there any without handrails? No  Home free of loose throw rugs in walkways, pet beds, electrical cords, etc? Yes  Adequate lighting in your home to reduce risk of falls? Yes   ASSISTIVE DEVICES UTILIZED TO PREVENT FALLS:  Life alert? No  Use of a cane, walker or w/c? No  Grab bars in the bathroom? No  Shower chair or bench in shower? No  Elevated toilet seat or a handicapped toilet? No    Cognitive Function: Normal cognitive status assessed by observation by this Nurse Health Advisor. No abnormalities found.       6CIT Screen 12/04/2019  What Year? 0 points  What month? 0 points  What time? 0 points  Count back from 20 0 points  Months in reverse 0 points  Repeat phrase 2 points  Total Score 2    Immunizations Immunization History  Administered Date(s) Administered  . Fluad Quad(high Dose 65+) 10/15/2020  . Influenza Split 10/21/2010, 10/13/2012  . Influenza, High Dose Seasonal PF 10/04/2018  . Influenza,inj,Quad PF,6+ Mos 10/02/2014, 09/09/2015  . Influenza-Unspecified 09/21/2016, 10/03/2017, 09/20/2019  . PFIZER SARS-COV-2 Vaccination 02/18/2020, 02/23/2020, 09/10/2020  . Pneumococcal Conjugate-13 07/21/2017  . Pneumococcal Polysaccharide-23 01/22/2013, 08/23/2018  . Tdap 10/21/2010  . Zoster 10/03/2013  . Zoster Recombinat (Shingrix) 04/27/2020    TDAP status: Up to date  Flu Vaccine status: Up to date  Pneumococcal vaccine status: Up to date  Covid-19 vaccine status: Completed vaccines  Qualifies for Shingles Vaccine? Yes   Zostavax completed Yes   Shingrix  Completed?: No.    Education has been provided regarding the importance of this vaccine. Patient has been advised to call insurance company to determine out of pocket expense if they have not yet received this vaccine. Advised may also receive vaccine at local pharmacy or Health Dept. Verbalized acceptance and understanding.  Screening Tests Health Maintenance  Topic Date Due  . OPHTHALMOLOGY EXAM  11/18/2017  . FOOT EXAM  12/03/2020  . TETANUS/TDAP  12/14/2021 (Originally 10/21/2020)  . HEMOGLOBIN A1C  03/07/2021  . COVID-19 Vaccine (4 - Booster) 03/10/2021  . MAMMOGRAM  10/23/2021  . COLONOSCOPY (Pts 45-59yr Insurance coverage will need to be confirmed)  09/13/2026  . INFLUENZA VACCINE  Completed  . DEXA SCAN  Completed  . Hepatitis C Screening  Completed  . PNA vac Low Risk Adult  Completed    Health Maintenance  Health Maintenance Due  Topic Date Due  . OPHTHALMOLOGY EXAM  11/18/2017  . FOOT EXAM  12/03/2020    Colorectal cancer screening: Type of screening: Colonoscopy. Completed 09/13/16. Repeat every 10 years  Mammogram status: Ordered today. Pt provided with contact info and advised to call to schedule appt.   Bone Density status: Completed 01/07/16. Results reflect: Previous DEXA scan was normal. No repeat needed unless advised by a physician.  Lung Cancer Screening: (Low Dose CT Chest recommended if  Age 49-80 years, 30 pack-year currently smoking OR have quit w/in 15years.) does qualify however is not interested in having this scan completed at this time.   Additional Screening:  Hepatitis C Screening: Up to date  Vision Screening: Recommended annual ophthalmology exams for early detection of glaucoma and other disorders of the eye. Is the patient up to date with their annual eye exam?  Yes  Who is the provider or what is the name of the office in which the patient attends annual eye exams? Dr Gloriann Loan If pt is not established with a provider, would they like to be  referred to a provider to establish care? No .   Dental Screening: Recommended annual dental exams for proper oral hygiene  Community Resource Referral / Chronic Care Management: CRR required this visit?  No   CCM required this visit?  No      Plan:     I have personally reviewed and noted the following in the patient's chart:   . Medical and social history . Use of alcohol, tobacco or illicit drugs  . Current medications and supplements . Functional ability and status . Nutritional status . Physical activity . Advanced directives . List of other physicians . Hospitalizations, surgeries, and ER visits in previous 12 months . Vitals . Screenings to include cognitive, depression, and falls . Referrals and appointments  In addition, I have reviewed and discussed with patient certain preventive protocols, quality metrics, and best practice recommendations. A written personalized care plan for preventive services as well as general preventive health recommendations were provided to patient.     Lenville Hibberd [place], [place]   01/10/9358   Nurse Notes: Pt needs a diabetic foot exam at next in office apt. Pt has an eye exam coming up in 2022. Requested exam notes once completed.

## 2020-12-14 ENCOUNTER — Other Ambulatory Visit: Payer: Self-pay

## 2020-12-14 ENCOUNTER — Ambulatory Visit (INDEPENDENT_AMBULATORY_CARE_PROVIDER_SITE_OTHER): Payer: PPO

## 2020-12-14 DIAGNOSIS — Z0000 Encounter for general adult medical examination without abnormal findings: Secondary | ICD-10-CM

## 2020-12-14 NOTE — Patient Instructions (Signed)
Heather Burke , Thank you for taking time to come for your Medicare Wellness Visit. I appreciate your ongoing commitment to your health goals. Please review the following plan we discussed and let me know if I can assist you in the future.   Screening recommendations/referrals: Colonoscopy: Up to date, due 08/2026 Mammogram: Ordered today. Please call [phone] to schedule your mammogram.  Bone Density: Previous DEXA scan was normal. No repeat needed unless advised by a physician. Recommended yearly ophthalmology/optometry visit for glaucoma screening and checkup Recommended yearly dental visit for hygiene and checkup  Vaccinations: Influenza vaccine: Done 10/15/20 Pneumococcal vaccine: Completed series Tdap vaccine: Currently due, declined at this time. Shingles vaccine: Shingrix # 2 due. Pt to receive at pharmacy this week.    Advanced directives: Advance directive discussed with you today. Even though you declined this today please call our office should you change your mind and we can give you the proper paperwork for you to fill out.  Conditions/risks identified: Smoking cessation discussed today. Recommend to continue to cut back on alcohol consumption in diet. Recommend no more than 1 drink a day. Continue to cut back on junk foods in diet to aid with weight loss.   Next appointment: 12/28/20 @ 9:40 AM with Heather Burke    Preventive Care 65 Years and Older, Female Preventive care refers to lifestyle choices and visits with your health care provider that can promote health and wellness. What does preventive care include?  A yearly physical exam. This is also called an annual well check.  Dental exams once or twice a year.  Routine eye exams. Ask your health care provider how often you should have your eyes checked.  Personal lifestyle choices, including:  Daily care of your teeth and gums.  Regular physical activity.  Eating a healthy diet.  Avoiding tobacco and  drug use.  Limiting alcohol use.  Practicing safe sex.  Taking low-dose aspirin every day.  Taking vitamin and mineral supplements as recommended by your health care provider. What happens during an annual well check? The services and screenings done by your health care provider during your annual well check will depend on your age, overall health, lifestyle risk factors, and family history of disease. Counseling  Your health care provider may ask you questions about your:  Alcohol use.  Tobacco use.  Drug use.  Emotional well-being.  Home and relationship well-being.  Sexual activity.  Eating habits.  History of falls.  Memory and ability to understand (cognition).  Work and work Astronomer.  Reproductive health. Screening  You may have the following tests or measurements:  Height, weight, and BMI.  Blood pressure.  Lipid and cholesterol levels. These may be checked every 5 years, or more frequently if you are over 18 years old.  Skin check.  Lung cancer screening. You may have this screening every year starting at age 34 if you have a 30-pack-year history of smoking and currently smoke or have quit within the past 15 years.  Fecal occult blood test (FOBT) of the stool. You may have this test every year starting at age 20.  Flexible sigmoidoscopy or colonoscopy. You may have a sigmoidoscopy every 5 years or a colonoscopy every 10 years starting at age 22.  Hepatitis C blood test.  Hepatitis B blood test.  Sexually transmitted disease (STD) testing.  Diabetes screening. This is done by checking your blood sugar (glucose) after you have not eaten for a while (fasting). You may have this done every 1-3  years.  Bone density scan. This is done to screen for osteoporosis. You may have this done starting at age 37.  Mammogram. This may be done every 1-2 years. Talk to your health care provider about how often you should have regular mammograms. Talk with your  health care provider about your test results, treatment options, and if necessary, the need for more tests. Vaccines  Your health care provider may recommend certain vaccines, such as:  Influenza vaccine. This is recommended every year.  Tetanus, diphtheria, and acellular pertussis (Tdap, Td) vaccine. You may need a Td booster every 10 years.  Zoster vaccine. You may need this after age 51.  Pneumococcal 13-valent conjugate (PCV13) vaccine. One dose is recommended after age 53.  Pneumococcal polysaccharide (PPSV23) vaccine. One dose is recommended after age 27. Talk to your health care provider about which screenings and vaccines you need and how often you need them. This information is not intended to replace advice given to you by your health care provider. Make sure you discuss any questions you have with your health care provider. Document Released: 01/01/2016 Document Revised: 08/24/2016 Document Reviewed: 10/06/2015 Elsevier Interactive Patient Education  2017 [employer] Prevention in the Home Falls can cause injuries. They can happen to people of all ages. There are many things you can do to make your home safe and to help prevent falls. What can I do on the outside of my home?  Regularly fix the edges of walkways and driveways and fix any cracks.  Remove anything that might make you trip as you walk through a door, such as a raised step or threshold.  Trim any bushes or trees on the path to your home.  Use bright outdoor lighting.  Clear any walking paths of anything that might make someone trip, such as rocks or tools.  Regularly check to see if handrails are loose or broken. Make sure that both sides of any steps have handrails.  Any raised decks and porches should have guardrails on the edges.  Have any leaves, snow, or ice cleared regularly.  Use sand or salt on walking paths during winter.  Clean up any spills in your garage right away. This includes oil  or grease spills. What can I do in the bathroom?  Use night lights.  Install grab bars by the toilet and in the tub and shower. Do not use towel bars as grab bars.  Use non-skid mats or decals in the tub or shower.  If you need to sit down in the shower, use a plastic, non-slip stool.  Keep the floor dry. Clean up any water that spills on the floor as soon as it happens.  Remove soap buildup in the tub or shower regularly.  Attach bath mats securely with double-sided non-slip rug tape.  Do not have throw rugs and other things on the floor that can make you trip. What can I do in the bedroom?  Use night lights.  Make sure that you have a light by your bed that is easy to reach.  Do not use any sheets or blankets that are too big for your bed. They should not hang down onto the floor.  Have a firm chair that has side arms. You can use this for support while you get dressed.  Do not have throw rugs and other things on the floor that can make you trip. What can I do in the kitchen?  Clean up any spills right away.  Avoid walking on wet floors.  Keep items that you use a lot in easy-to-reach places.  If you need to reach something above you, use a strong step stool that has a grab bar.  Keep electrical cords out of the way.  Do not use floor polish or wax that makes floors slippery. If you must use wax, use non-skid floor wax.  Do not have throw rugs and other things on the floor that can make you trip. What can I do with my stairs?  Do not leave any items on the stairs.  Make sure that there are handrails on both sides of the stairs and use them. Fix handrails that are broken or loose. Make sure that handrails are as long as the stairways.  Check any carpeting to make sure that it is firmly attached to the stairs. Fix any carpet that is loose or worn.  Avoid having throw rugs at the top or bottom of the stairs. If you do have throw rugs, attach them to the floor with  carpet tape.  Make sure that you have a light switch at the top of the stairs and the bottom of the stairs. If you do not have them, ask someone to add them for you. What else can I do to help prevent falls?  Wear shoes that:  Do not have high heels.  Have rubber bottoms.  Are comfortable and fit you well.  Are closed at the toe. Do not wear sandals.  If you use a stepladder:  Make sure that it is fully opened. Do not climb a closed stepladder.  Make sure that both sides of the stepladder are locked into place.  Ask someone to hold it for you, if possible.  Clearly mark and make sure that you can see:  Any grab bars or handrails.  First and last steps.  Where the edge of each step is.  Use tools that help you move around (mobility aids) if they are needed. These include:  Canes.  Walkers.  Scooters.  Crutches.  Turn on the lights when you go into a dark area. Replace any light bulbs as soon as they burn out.  Set up your furniture so you have a clear path. Avoid moving your furniture around.  If any of your floors are uneven, fix them.  If there are any pets around you, be aware of where they are.  Review your medicines with your doctor. Some medicines can make you feel dizzy. This can increase your chance of falling. Ask your doctor what other things that you can do to help prevent falls. This information is not intended to replace advice given to you by your health care provider. Make sure you discuss any questions you have with your health care provider. Document Released: 10/01/2009 Document Revised: 05/12/2016 Document Reviewed: 01/09/2015 Elsevier Interactive Patient Education  2017 [employer].

## 2020-12-23 ENCOUNTER — Encounter: Payer: Self-pay | Admitting: *Deleted

## 2020-12-25 NOTE — Progress Notes (Signed)
Established patient visit   Patient: Heather Burke   DOB: [birth year]   70 y.o. Female  MRN: [id number] Visit Date: 12/28/2020  Today's healthcare provider: [name], PA-C   Chief Complaint  Patient presents with   Follow-up   Subjective    HPI  Diabetes Mellitus Type II, Follow-up  Lab Results  Component Value Date   HGBA1C 7.7 (A) 12/28/2020   HGBA1C 6.4 (A) 09/07/2020   HGBA1C 7.9 (A) 06/04/2020   Wt Readings from Last 3 Encounters:  12/28/20 184 lb 4.8 oz (83.6 kg)  09/07/20 186 lb 12.8 oz (84.7 kg)  06/04/20 187 lb 12.8 oz (85.2 kg)   Last seen for diabetes 3 months ago.  Management since then includes Will continue Trulicity 7.5ZW SQ weekly. OK to stop Glipizide to avoid hypoglycemic episodes.Continue Lovastatin 45m. F/U in 3 months. She reports excellent compliance with treatment. She is not having side effects.  Symptoms: No fatigue No foot ulcerations  No appetite changes No nausea  No paresthesia of the feet  No polydipsia  No polyuria No visual disturbances   No vomiting     Home blood sugar records: fasting range: not checking   Episodes of hypoglycemia? No    Most Recent Eye Exam: Scheduled January 27,2022 Current exercise: none Current diet habits: regular  Pertinent Labs: Lab Results  Component Value Date   CHOL 168 12/04/2019   HDL 37 (L) 12/04/2019   LDLCALC 95 12/04/2019   TRIG 212 (H) 12/04/2019   CHOLHDL 4.5 (H) 12/04/2019   Lab Results  Component Value Date   NA 138 12/04/2019   K 4.0 12/04/2019   CREATININE 0.98 12/04/2019   GFRNONAA 60 12/04/2019   GFRAA 69 12/04/2019   GLUCOSE 150 (H) 12/04/2019     --------------------------------------------------------------------------------------------------- Hypertension, follow-up  BP Readings from Last 3 Encounters:  12/28/20 (!) 180/96  09/07/20 (!) 158/69  06/04/20 (!) 151/77   Wt Readings from Last 3 Encounters:  12/28/20 184 lb 4.8 oz (83.6 kg)   09/07/20 186 lb 12.8 oz (84.7 kg)  06/04/20 187 lb 12.8 oz (85.2 kg)     She was last seen for hypertension 3 months ago.  BP at that visit was 158/69. Management since that visit includes Continue Losartan-HCTZ 100-298m Metoprolol XR 20055mand Diltiazem SR 120m37mD. Add clonidine at bedtime. F/U in 3 months  She reports excellent compliance with treatment. She is not having side effects.  She is following a Regular diet. She is not exercising. She does smoke.  Outside blood pressures are 151/80's when she checks. Doesn't really checked Blood Pressure Symptoms: No chest pain No chest pressure  No palpitations No syncope  No dyspnea No orthopnea  No paroxysmal nocturnal dyspnea No lower extremity edema   Pertinent labs: Lab Results  Component Value Date   CHOL 168 12/04/2019   HDL 37 (L) 12/04/2019   LDLCALC 95 12/04/2019   TRIG 212 (H) 12/04/2019   CHOLHDL 4.5 (H) 12/04/2019   Lab Results  Component Value Date   NA 138 12/04/2019   K 4.0 12/04/2019   CREATININE 0.98 12/04/2019   GFRNONAA 60 12/04/2019   GFRAA 69 12/04/2019   GLUCOSE 150 (H) 12/04/2019     The 10-year ASCVD risk score (GofMikey BussingJr., et al., 2013) is: 52.9%   ---------------------------------------------------------------------------------------------------  Patient Active Problem List   Diagnosis Date Noted   Benign neoplasm of sigmoid colon    Mass of right breast 02/17/2016   History  of parotitis 01/05/2016   Hypothyroidism 09/29/2015   Hyperlipemia 09/29/2015   Newly recognized murmur 09/29/2015   Allergic rhinitis 08/27/2015   Anxiety 08/27/2015   Type 2 diabetes mellitus with other diabetic kidney complication ([place]) 53/29/9242   Avitaminosis D 08/27/2015   Compulsive tobacco user syndrome 08/27/2015   Hypertension 07/01/2015   Past Medical History:  Diagnosis Date   Breast cancer ([place]) 06/08/2015   T1c, N0;  ER+,PR+, Her 2 neg (FISH neg) x2., SLN x 3 negative.  Mammoprint: Low risk. Multifocal.Invasive mammary and lobular carcinoma.    Diabetes mellitus without complication (HCC)    GERD (gastroesophageal reflux disease)    Heart murmur 2016   newly diagnosed, slight murmur   Hyperlipidemia    Hypertension    Hypothyroidism    Lupus (HCC)    PONV (postoperative nausea and vomiting)    with Hysterectomy   Thyroid disease        Medications: Outpatient Medications Prior to Visit  Medication Sig   aspirin 81 MG tablet Take 81 mg by mouth daily.   Bisacodyl (GENTLE LAXATIVE PO) Take by mouth.   calcium carbonate (OS-CAL) 600 MG TABS tablet Take 1 tablet (600 mg total) by mouth 2 (two) times daily with a meal.   Cholecalciferol (D3 ADULT PO) Take 1 tablet by mouth daily. 1000 units daily   diltiazem (CARDIZEM SR) 120 MG 12 hr capsule TAKE 1 CAPSULE BY MOUTH 2 TIMES DAILY.   Dulaglutide (TRULICITY) 1.5 AS/3.4HD SOPN Inject 0.5 mLs (1.5 mg total) into the skin once a week.   FIBER PO Take by mouth 2 (two) times daily.   fluticasone (FLONASE) 50 MCG/ACT nasal spray SPRAY 2 SPRAYS INTO EACH NOSTRIL EVERY DAY   levothyroxine (SYNTHROID) 88 MCG tablet Take 1 tablet (88 mcg total) by mouth daily.   losartan-hydrochlorothiazide (HYZAAR) 100-25 MG tablet Take 1 tablet by mouth daily.   lovastatin (MEVACOR) 20 MG tablet Take 1 tablet (20 mg total) by mouth at bedtime.   metoprolol (TOPROL-XL) 200 MG 24 hr tablet Take 1 tablet (200 mg total) by mouth daily.   Misc Natural Products (T-RELIEF CBD+13 SL) Place 500 mg under the tongue daily.   Omega-3 Fatty Acids (FISH OIL) 1200 MG CAPS Take by mouth daily. 2400 qam 1200 mg qpm   potassium chloride (KLOR-CON M10) 10 MEQ tablet Take 1 tablet (10 mEq total) by mouth daily.   Blood Glucose Monitoring Suppl (ONETOUCH VERIO) w/Device KIT To check blood sugar daily   CANNABIDIOL PO Take 500 mg by mouth daily. (Patient not taking: No sig reported)   glucose blood (ONETOUCH VERIO) test  strip To check blood sugar daily   Lancets (ONETOUCH ULTRASOFT) lancets To check blood sugar daily   Nutritional Supplements (QUINOA/KALE/HEMP PO) Place 750 mg under the tongue. (Patient not taking: No sig reported)   SHINGRIX injection  (Patient not taking: No sig reported)   No facility-administered medications prior to visit.    Review of Systems  Constitutional: Negative for appetite change, chills, fatigue and fever.  Eyes: Positive for visual disturbance ("Burred Vision").  Respiratory: Negative for chest tightness and shortness of breath.   Cardiovascular: Negative for chest pain, palpitations and leg swelling.  Gastrointestinal: Negative for abdominal pain, nausea and vomiting.  Neurological: Positive for dizziness. Negative for weakness.    Last CBC Lab Results  Component Value Date   WBC 7.0 12/04/2019   HGB 15.1 12/04/2019   HCT 43.8 12/04/2019   MCV 95 12/04/2019   MCH 32.8  12/04/2019   RDW 12.5 12/04/2019   PLT 237 18/84/1660   Last metabolic panel Lab Results  Component Value Date   GLUCOSE 150 (H) 12/04/2019   NA 138 12/04/2019   K 4.0 12/04/2019   CL 96 12/04/2019   CO2 22 12/04/2019   BUN 12 12/04/2019   CREATININE 0.98 12/04/2019   GFRNONAA 60 12/04/2019   GFRAA 69 12/04/2019   CALCIUM 10.2 12/04/2019   PROT 7.6 12/04/2019   ALBUMIN 4.3 12/04/2019   LABGLOB 3.3 12/04/2019   AGRATIO 1.3 12/04/2019   BILITOT 0.5 12/04/2019   ALKPHOS 64 12/04/2019   AST 55 (H) 12/04/2019   ALT 43 (H) 12/04/2019   ANIONGAP 13 01/16/2018      Objective    BP (!) 180/96 (BP Location: Right Arm, Patient Position: Sitting, Cuff Size: Normal)    Pulse 67    Temp 98.7 F (37.1 C) (Oral)    Resp 16    Wt 184 lb 4.8 oz (83.6 kg)    BMI 32.65 kg/m  BP Readings from Last 3 Encounters:  12/28/20 (!) 180/96  09/07/20 (!) 158/69  06/04/20 (!) 151/77   Wt Readings from Last 3 Encounters:  12/28/20 184 lb 4.8 oz (83.6 kg)  09/07/20 186 lb 12.8 oz (84.7 kg)  06/04/20  187 lb 12.8 oz (85.2 kg)      Physical Exam Vitals reviewed.  Constitutional:      General: She is not in acute distress.    Appearance: Normal appearance. She is well-developed and well-nourished. She is obese. She is not ill-appearing or diaphoretic.  Cardiovascular:     Rate and Rhythm: Normal rate and regular rhythm.     Pulses: Normal pulses.     Heart sounds: Murmur heard.  No friction rub. No gallop.   Pulmonary:     Effort: Pulmonary effort is normal. No respiratory distress.     Breath sounds: Normal breath sounds. No wheezing or rales.  Musculoskeletal:     Cervical back: Normal range of motion and neck supple.  Neurological:     Mental Status: She is alert.      Results for orders placed or performed in visit on 12/28/20  POCT glycosylated hemoglobin (Hb A1C)  Result Value Ref Range   Hemoglobin A1C 7.7 (A) 4.0 - 5.6 %   Est. average glucose Bld gHb Est-mCnc 174     Assessment & Plan     1. Type 2 diabetes mellitus with hypercholesterolemia (HCC) A1c did increase from 6.4 to 7.7 with stopping glipizide previously. Will continue current regimen of Trulicity 6.30ZS weekly. Will f/u in 3 months and allow time to work on lifestyle . If still elevated at next visit, will add Glipizide back for better control. Patient is on ARB. Is intolerant to statins (nausea and vomiting). F/U in 3 months. - POCT glycosylated hemoglobin (Hb A1C)  2. Type 2 diabetes mellitus with other diabetic kidney complication ([place]) See above medical treatment plan.  3. Essential hypertension Elevated today in the office. Will increase Diltiazem to 358m, take 1 capsule (1247m in the morning and 2 capsules (24043mat bedtime. F/U in 3 months.  - diltiazem (CARDIZEM SR) 120 MG 12 hr capsule; Take 1 capsule (120m62mn the morning and take 2 capsules (240mg32m the evening  Dispense: 270 capsule; Refill: 1  4. Statin intolerance Has had nausea and vomiting with statins. Declines trying any other  statins at this time.    No follow-ups on file.  Reynolds Bowl, PA-C, have reviewed all documentation for this visit. The documentation on 12/29/20 for the exam, diagnosis, procedures, and orders are all accurate and complete.   Rubye Beach  [facility] [phone] (phone) [phone] (fax)  [place]

## 2020-12-28 ENCOUNTER — Other Ambulatory Visit: Payer: Self-pay

## 2020-12-28 ENCOUNTER — Encounter: Payer: Self-pay | Admitting: Physician Assistant

## 2020-12-28 ENCOUNTER — Ambulatory Visit (INDEPENDENT_AMBULATORY_CARE_PROVIDER_SITE_OTHER): Payer: PPO | Admitting: Physician Assistant

## 2020-12-28 VITALS — BP 180/96 | HR 67 | Temp 98.7°F | Resp 16 | Wt 184.3 lb

## 2020-12-28 DIAGNOSIS — E7800 Pure hypercholesterolemia, unspecified: Secondary | ICD-10-CM | POA: Diagnosis not present

## 2020-12-28 DIAGNOSIS — E1129 Type 2 diabetes mellitus with other diabetic kidney complication: Secondary | ICD-10-CM

## 2020-12-28 DIAGNOSIS — E1169 Type 2 diabetes mellitus with other specified complication: Secondary | ICD-10-CM

## 2020-12-28 DIAGNOSIS — Z789 Other specified health status: Secondary | ICD-10-CM | POA: Diagnosis not present

## 2020-12-28 DIAGNOSIS — I10 Essential (primary) hypertension: Secondary | ICD-10-CM | POA: Diagnosis not present

## 2020-12-28 LAB — POCT GLYCOSYLATED HEMOGLOBIN (HGB A1C)
Est. average glucose Bld gHb Est-mCnc: 174
Hemoglobin A1C: 7.7 % — AB (ref 4.0–5.6)

## 2020-12-28 MED ORDER — DILTIAZEM HCL ER 120 MG PO CP12: ORAL_CAPSULE | ORAL | 1 refills | Status: DC

## 2020-12-28 NOTE — Patient Instructions (Signed)
Diabetes Mellitus and Nutrition, Adult When you have diabetes, or diabetes mellitus, it is very important to have healthy eating habits because your blood sugar (glucose) levels are greatly affected by what you eat and drink. Eating healthy foods in the right amounts, at about the same times every day, can help you:  Control your blood glucose.  Lower your risk of heart disease.  Improve your blood pressure.  Reach or maintain a healthy weight. What can affect my meal plan? Every person with diabetes is different, and each person has different needs for a meal plan. Your health care provider may recommend that you work with a dietitian to make a meal plan that is best for you. Your meal plan may vary depending on factors such as:  The calories you need.  The medicines you take.  Your weight.  Your blood glucose, blood pressure, and cholesterol levels.  Your activity level.  Other health conditions you have, such as heart or kidney disease. How do carbohydrates affect me? Carbohydrates, also called carbs, affect your blood glucose level more than any other type of food. Eating carbs naturally raises the amount of glucose in your blood. Carb counting is a method for keeping track of how many carbs you eat. Counting carbs is important to keep your blood glucose at a healthy level, especially if you use insulin or take certain oral diabetes medicines. It is important to know how many carbs you can safely have in each meal. This is different for every person. Your dietitian can help you calculate how many carbs you should have at each meal and for each snack. How does alcohol affect me? Alcohol can cause a sudden decrease in blood glucose (hypoglycemia), especially if you use insulin or take certain oral diabetes medicines. Hypoglycemia can be a life-threatening condition. Symptoms of hypoglycemia, such as sleepiness, dizziness, and confusion, are similar to symptoms of having too much  alcohol.  Do not drink alcohol if: ? Your health care provider tells you not to drink. ? You are pregnant, may be pregnant, or are planning to become pregnant.  If you drink alcohol: ? Do not drink on an empty stomach. ? Limit how much you use to:  0-1 drink a day for women.  0-2 drinks a day for men. ? Be aware of how much alcohol is in your drink. In the [place] one drink equals one 12 oz bottle of beer (355 mL), one 5 oz glass of wine (148 mL), or one 1 oz glass of hard liquor (44 mL). ? Keep yourself hydrated with water, diet soda, or unsweetened iced tea.  Keep in mind that regular soda, juice, and other mixers may contain a lot of sugar and must be counted as carbs. What are tips for following this plan? Reading food labels  Start by checking the serving size on the "Nutrition Facts" label of packaged foods and drinks. The amount of calories, carbs, fats, and other nutrients listed on the label is based on one serving of the item. Many items contain more than one serving per package.  Check the total grams (g) of carbs in one serving. You can calculate the number of servings of carbs in one serving by dividing the total carbs by 15. For example, if a food has 30 g of total carbs per serving, it would be equal to 2 servings of carbs.  Check the number of grams (g) of saturated fats and trans fats in one serving. Choose foods that have   a low amount or none of these fats.  Check the number of milligrams (mg) of salt (sodium) in one serving. Most people should limit total sodium intake to less than 2,300 mg per day.  Always check the nutrition information of foods labeled as "low-fat" or "nonfat." These foods may be higher in added sugar or refined carbs and should be avoided.  Talk to your dietitian to identify your daily goals for nutrients listed on the label. Shopping  Avoid buying canned, pre-made, or processed foods. These foods tend to be high in fat, sodium, and added  sugar.  Shop around the outside edge of the grocery store. This is where you will most often find fresh fruits and vegetables, bulk grains, fresh meats, and fresh dairy. Cooking  Use low-heat cooking methods, such as baking, instead of high-heat cooking methods like deep frying.  Cook using healthy oils, such as olive, canola, or sunflower oil.  Avoid cooking with butter, cream, or high-fat meats. Meal planning  Eat meals and snacks regularly, preferably at the same times every day. Avoid going long periods of time without eating.  Eat foods that are high in fiber, such as fresh fruits, vegetables, beans, and whole grains. Talk with your dietitian about how many servings of carbs you can eat at each meal.  Eat 4-6 oz (112-168 g) of lean protein each day, such as lean meat, chicken, fish, eggs, or tofu. One ounce (oz) of lean protein is equal to: ? 1 oz (28 g) of meat, chicken, or fish. ? 1 egg. ?  cup (62 g) of tofu.  Eat some foods each day that contain healthy fats, such as avocado, nuts, seeds, and fish.   What foods should I eat? Fruits Berries. Apples. Oranges. Peaches. Apricots. Plums. Grapes. Mango. Papaya. Pomegranate. Kiwi. Cherries. Vegetables Lettuce. Spinach. Leafy greens, including kale, chard, collard greens, and mustard greens. Beets. Cauliflower. Cabbage. Broccoli. Carrots. Green beans. Tomatoes. Peppers. Onions. Cucumbers. Brussels sprouts. Grains Whole grains, such as whole-wheat or whole-grain bread, crackers, tortillas, cereal, and pasta. Unsweetened oatmeal. Quinoa. Brown or wild rice. Meats and other proteins Seafood. Poultry without skin. Lean cuts of poultry and beef. Tofu. Nuts. Seeds. Dairy Low-fat or fat-free dairy products such as milk, yogurt, and cheese. The items listed above may not be a complete list of foods and beverages you can eat. Contact a dietitian for more information. What foods should I avoid? Fruits Fruits canned with  syrup. Vegetables Canned vegetables. Frozen vegetables with butter or cream sauce. Grains Refined white flour and flour products such as bread, pasta, snack foods, and cereals. Avoid all processed foods. Meats and other proteins Fatty cuts of meat. Poultry with skin. Breaded or fried meats. Processed meat. Avoid saturated fats. Dairy Full-fat yogurt, cheese, or milk. Beverages Sweetened drinks, such as soda or iced tea. The items listed above may not be a complete list of foods and beverages you should avoid. Contact a dietitian for more information. Questions to ask a health care provider  Do I need to meet with a diabetes educator?  Do I need to meet with a dietitian?  What number can I call if I have questions?  When are the best times to check my blood glucose? Where to find more information:  American Diabetes Association: diabetes.org  Academy of Nutrition and Dietetics: www.eatright.org  National Institute of Diabetes and Digestive and Kidney Diseases: www.niddk.nih.gov  Association of Diabetes Care and Education Specialists: www.diabeteseducator.org Summary  It is important to have healthy eating   habits because your blood sugar (glucose) levels are greatly affected by what you eat and drink.  A healthy meal plan will help you control your blood glucose and maintain a healthy lifestyle.  Your health care provider may recommend that you work with a dietitian to make a meal plan that is best for you.  Keep in mind that carbohydrates (carbs) and alcohol have immediate effects on your blood glucose levels. It is important to count carbs and to use alcohol carefully. This information is not intended to replace advice given to you by your health care provider. Make sure you discuss any questions you have with your health care provider. Document Revised: 11/12/2019 Document Reviewed: 11/12/2019 Elsevier Patient Education  2021 Elsevier Inc.  

## 2020-12-29 DIAGNOSIS — Z789 Other specified health status: Secondary | ICD-10-CM | POA: Insufficient documentation

## 2021-01-12 DIAGNOSIS — E113393 Type 2 diabetes mellitus with moderate nonproliferative diabetic retinopathy without macular edema, bilateral: Secondary | ICD-10-CM | POA: Diagnosis not present

## 2021-01-12 LAB — HM DIABETES EYE EXAM

## 2021-01-19 ENCOUNTER — Ambulatory Visit (INDEPENDENT_AMBULATORY_CARE_PROVIDER_SITE_OTHER): Payer: PPO

## 2021-01-19 DIAGNOSIS — E1129 Type 2 diabetes mellitus with other diabetic kidney complication: Secondary | ICD-10-CM | POA: Diagnosis not present

## 2021-01-19 NOTE — Chronic Care Management (AMB) (Signed)
[employer] Patient Assistance form for Trulicity completed by patient and faxed for review on 01/19/21.  [place] [phone]

## 2021-02-10 ENCOUNTER — Telehealth: Payer: Self-pay

## 2021-02-10 NOTE — Progress Notes (Signed)
Chronic Care Management Pharmacy Assistant   Name: Heather Burke  MRN: [id number] DOB: 1951/08/29  Reason for Encounter: Medication Review /General Adherence call. Patient Questions:  1.  Have you seen any other providers since your last visit? Yes, 12/28/2020 PCP Fenton Malling  2.  Any changes in your medicines or health? Yes, 12/28/2020 PCP Fenton Malling increase Diltiazem to 353m, take 1 capsule (1215m in the morning and 2 capsules (24064mat bedtime     PCP : BurMar DaringA-C  Allergies:   Allergies  Allergen Reactions  . Amlodipine Nausea Only and Other (See Comments)    Stomach cramping  . Atorvastatin Nausea And Vomiting  . Lisinopril Cough  . Pravastatin Nausea And Vomiting  . Nifedipine Er Other (See Comments)    headache    Medications: Outpatient Encounter Medications as of 02/10/2021  Medication Sig  . aspirin 81 MG tablet Take 81 mg by mouth daily.  . Bisacodyl (GENTLE LAXATIVE PO) Take by mouth.  . Blood Glucose Monitoring Suppl (ONETOUCH VERIO) w/Device KIT To check blood sugar daily  . calcium carbonate (OS-CAL) 600 MG TABS tablet Take 1 tablet (600 mg total) by mouth 2 (two) times daily with a meal.  . CANNABIDIOL PO Take 500 mg by mouth daily. (Patient not taking: No sig reported)  . Cholecalciferol (D3 ADULT PO) Take 1 tablet by mouth daily. 1000 units daily  . diltiazem (CARDIZEM SR) 120 MG 12 hr capsule Take 1 capsule (120m35mn the morning and take 2 capsules (240mg65m the evening  . Dulaglutide (TRULICITY) 1.5 MG/0.BD/5.3GD Inject 0.5 mLs (1.5 mg total) into the skin once a week.  . FIBMarland KitchenR PO Take by mouth 2 (two) times daily.  . fluticasone (FLONASE) 50 MCG/ACT nasal spray SPRAY 2 SPRAYS INTO EACH NOSTRIL EVERY DAY  . glucose blood (ONETOUCH VERIO) test strip To check blood sugar daily  . Lancets (ONETOUCH ULTRASOFT) lancets To check blood sugar daily  . levothyroxine (SYNTHROID) 88 MCG tablet Take 1 tablet (88 mcg total) by  mouth daily.  . losMarland Kitchenrtan-hydrochlorothiazide (HYZAAR) 100-25 MG tablet Take 1 tablet by mouth daily.  . lovMarland Kitchenstatin (MEVACOR) 20 MG tablet Take 1 tablet (20 mg total) by mouth at bedtime.  . metoprolol (TOPROL-XL) 200 MG 24 hr tablet Take 1 tablet (200 mg total) by mouth daily.  . Misc Natural Products (T-RELIEF CBD+13 SL) Place 500 mg under the tongue daily.  . Nutritional Supplements (QUINOA/KALE/HEMP PO) Place 750 mg under the tongue. (Patient not taking: No sig reported)  . Omega-3 Fatty Acids (FISH OIL) 1200 MG CAPS Take by mouth daily. 2400 qam 1200 mg qpm  . potassium chloride (KLOR-CON M10) 10 MEQ tablet Take 1 tablet (10 mEq total) by mouth daily.   No facility-administered encounter medications on file as of 02/10/2021.    Current Diagnosis: Patient Active Problem List   Diagnosis Date Noted  . Statin intolerance 12/29/2020  . Benign neoplasm of sigmoid colon   . Mass of right breast 02/17/2016  . History of parotitis 01/05/2016  . Hypothyroidism 09/29/2015  . Hyperlipemia 09/29/2015  . Newly recognized murmur 09/29/2015  . Allergic rhinitis 08/27/2015  . Anxiety 08/27/2015  . Type 2 diabetes mellitus with other diabetic kidney complication (HCC) [id number] D 08/27/2015  . Compulsive tobacco user syndrome 08/27/2015  . Hypertension 07/01/2015    Goals Addressed   None    Called patient and discussed medication adherence  with patient, no issues at this time  with current medication.  Patient states the only complaint is from her Trulicity bothering her stomach,but "it is okay".   Patient denies ED visit since her last CPP follow up.  Patient denies any side effects with her medication.  Patient states the only complaint is from her Trulicity bothering her stomach,but "it is okay". Patient denies any problems with her current pharmacy      Blood sugar readings: Patient states she does not check her blood sugar at home.  Blood pressure  readings: Patient reports her blood pressure has been ranging 139/80's. Patient states she has been watching what she eats and have lost a total of 17 pounds so far.  Patient is asking for a update of her patient assistance form that was fax on 01/19/2021 from [facility] for Trulicity.Patient states she has 1 box and 3 pens left on hand as of 02/10/2021.   Spoke with [employer] ,Per [facility] they need patient assistance form to be fax again. Notified clinical pharmacist   Follow-Up:  Pharmacist Review   Bessie Menlo Pharmacist Assistant [phone]

## 2021-02-17 ENCOUNTER — Other Ambulatory Visit: Payer: Self-pay | Admitting: Physician Assistant

## 2021-02-17 DIAGNOSIS — I10 Essential (primary) hypertension: Secondary | ICD-10-CM

## 2021-02-18 ENCOUNTER — Other Ambulatory Visit: Payer: Self-pay | Admitting: Physician Assistant

## 2021-02-18 DIAGNOSIS — Z1231 Encounter for screening mammogram for malignant neoplasm of breast: Secondary | ICD-10-CM

## 2021-02-19 ENCOUNTER — Other Ambulatory Visit: Payer: Self-pay | Admitting: Physician Assistant

## 2021-02-19 DIAGNOSIS — I10 Essential (primary) hypertension: Secondary | ICD-10-CM

## 2021-03-10 ENCOUNTER — Encounter: Payer: Self-pay | Admitting: Physician Assistant

## 2021-03-12 ENCOUNTER — Other Ambulatory Visit: Payer: Self-pay

## 2021-03-12 ENCOUNTER — Ambulatory Visit
Admission: RE | Admit: 2021-03-12 | Discharge: 2021-03-12 | Disposition: A | Discharge status: Home / Self Care | Payer: PPO | Source: Ambulatory Visit | Attending: Physician Assistant | Admitting: Physician Assistant

## 2021-03-12 DIAGNOSIS — Z1231 Encounter for screening mammogram for malignant neoplasm of breast: Secondary | ICD-10-CM | POA: Diagnosis not present

## 2021-03-15 ENCOUNTER — Ambulatory Visit (INDEPENDENT_AMBULATORY_CARE_PROVIDER_SITE_OTHER): Payer: PPO | Admitting: Physician Assistant

## 2021-03-15 ENCOUNTER — Encounter: Payer: Self-pay | Admitting: Physician Assistant

## 2021-03-15 ENCOUNTER — Other Ambulatory Visit: Payer: Self-pay

## 2021-03-15 VITALS — BP 165/82 | HR 62 | Temp 98.6°F | Wt 173.0 lb

## 2021-03-15 DIAGNOSIS — E1129 Type 2 diabetes mellitus with other diabetic kidney complication: Secondary | ICD-10-CM

## 2021-03-15 DIAGNOSIS — I10 Essential (primary) hypertension: Secondary | ICD-10-CM | POA: Diagnosis not present

## 2021-03-15 LAB — POCT GLYCOSYLATED HEMOGLOBIN (HGB A1C): Hemoglobin A1C: 6.5 % — AB (ref 4.0–5.6)

## 2021-03-15 NOTE — Progress Notes (Signed)
Established patient visit   Patient: Heather Burke   DOB: 20-Aug-1951   70 y.o. Female  MRN: [id number] Visit Date: 03/15/2021  Today's healthcare provider: [name], PA-C   Chief Complaint  Patient presents with  . Hypertension  . Diabetes   Subjective    HPI  Diabetes Mellitus Type II, follow-up  Lab Results  Component Value Date   HGBA1C 6.5 (A) 03/15/2021   HGBA1C 7.7 (A) 12/28/2020   HGBA1C 6.4 (A) 09/07/2020   Last seen for diabetes 2 months ago.  Management since then includes  Increasing Trulicity to 4.5YK once a week. She reports pt has not started the increased dose of Trulicity yet.  She still has two doses of .86m left.   She is having side effects. Pt states Trulicity is causing constipation.   Home blood sugar records: are not being checked.  Episodes of hypoglycemia? No     Most Recent Eye Exam: 01/12/2021    --------------------------------------------------------------------------------------------------- Hypertension, follow-up  BP Readings from Last 3 Encounters:  03/15/21 (!) 165/82  12/28/20 (!) 180/96  09/07/20 (!) 158/69   Wt Readings from Last 3 Encounters:  03/15/21 173 lb (78.5 kg)  12/28/20 184 lb 4.8 oz (83.6 kg)  09/07/20 186 lb 12.8 oz (84.7 kg)     She was last seen for hypertension 2 months ago.  Management since that visit includes increasing diltiazem to 3691mdaily. She reports excellent compliance with treatment. She is not having side effects.  She is exercising. She is adherent to low salt diet.   Outside blood pressures are improving at home 120-140's/60-70's.  She does smoke.  Use of agents associated with hypertension: none.    Symptoms: No appetite changes No foot ulcerations  No chest pain No chest pressure/discomfort  No dyspnea No orthopnea  No fatigue No lower extremity edema  No palpitations No paroxysmal nocturnal dyspnea  No nausea No numbness or tingling of extremity  No  polydipsia No polyuria  No speech difficulty No syncope   Last metabolic panel Lab Results  Component Value Date   GLUCOSE 150 (H) 12/04/2019   NA 138 12/04/2019   K 4.0 12/04/2019   BUN 12 12/04/2019   CREATININE 0.98 12/04/2019   GFRNONAA 60 12/04/2019   GFRAA 69 12/04/2019   CALCIUM 10.2 12/04/2019   AST 55 (H) 12/04/2019   ALT 43 (H) 12/04/2019   The 10-year ASCVD risk score (GMikey BussingC Jr., et al., 2013) is: 46.8%  ---------------------------------------------------------------------------------------------------   Patient Active Problem List   Diagnosis Date Noted  . Statin intolerance 12/29/2020  . Benign neoplasm of sigmoid colon   . Mass of right breast 02/17/2016  . History of parotitis 01/05/2016  . Hypothyroidism 09/29/2015  . Hyperlipemia 09/29/2015  . Newly recognized murmur 09/29/2015  . Allergic rhinitis 08/27/2015  . Anxiety 08/27/2015  . Type 2 diabetes mellitus with other diabetic kidney complication ([id number]/83/[id number]. Avitaminosis D 08/27/2015  . Compulsive tobacco user syndrome 08/27/2015  . Hypertension 07/01/2015   Past Medical History:  Diagnosis Date  . Breast cancer (HCLewis06/20/2016   T1c, N0;  ER+,PR+, Her 2 neg (FISH neg) x2., SLN x 3 negative. Mammoprint: Low risk. Multifocal.Invasive mammary and lobular carcinoma.   . Diabetes mellitus without complication ([place]  . GERD (gastroesophageal reflux disease)   . Heart murmur 2016   newly diagnosed, slight murmur  . Hyperlipidemia   . Hypertension   . Hypothyroidism   . Lupus (HCW[place]  .  PONV (postoperative nausea and vomiting)    with Hysterectomy  . Thyroid disease    Social History   Tobacco Use  . Smoking status: Current Every Day Smoker    Packs/day: 2.00    Years: 49.00    Pack years: 98.00    Types: Cigarettes  . Smokeless tobacco: Never Used  Vaping Use  . Vaping Use: Never used  Substance Use Topics  . Alcohol use: Yes    Alcohol/week: 14.0 - 28.0 standard drinks     Types: 14 - 28 Cans of beer per week    Comment: 2-3 beers a day - declined cutting back anymore  . Drug use: No   Allergies  Allergen Reactions  . Amlodipine Nausea Only and Other (See Comments)    Stomach cramping  . Atorvastatin Nausea And Vomiting  . Lisinopril Cough  . Pravastatin Nausea And Vomiting  . Nifedipine Er Other (See Comments)    headache     Medications: Outpatient Medications Prior to Visit  Medication Sig  . aspirin 81 MG tablet Take 81 mg by mouth daily.  . Bisacodyl (GENTLE LAXATIVE PO) Take by mouth.  . Blood Glucose Monitoring Suppl (ONETOUCH VERIO) w/Device KIT To check blood sugar daily  . calcium carbonate (OS-CAL) 600 MG TABS tablet Take 1 tablet (600 mg total) by mouth 2 (two) times daily with a meal.  . CANNABIDIOL PO Take 500 mg by mouth daily.  . Cholecalciferol (D3 ADULT PO) Take 1 tablet by mouth daily. 1000 units daily  . Collagen-Vitamin C-Biotin (COLLAGEN 1500/C PO) Take by mouth.  . diltiazem (CARDIZEM SR) 120 MG 12 hr capsule Take 1 capsule (193m) in the morning and take 2 capsules (2463m in the evening  . FIBER PO Take by mouth 2 (two) times daily.  . fluticasone (FLONASE) 50 MCG/ACT nasal spray SPRAY 2 SPRAYS INTO EACH NOSTRIL EVERY DAY  . glucose blood (ONETOUCH VERIO) test strip To check blood sugar daily  . Lancets (ONETOUCH ULTRASOFT) lancets To check blood sugar daily  . levothyroxine (SYNTHROID) 88 MCG tablet Take 1 tablet (88 mcg total) by mouth daily.  . Marland Kitchenosartan-hydrochlorothiazide (HYZAAR) 100-25 MG tablet Take 1 tablet by mouth daily.  . Marland Kitchenovastatin (MEVACOR) 20 MG tablet Take 1 tablet (20 mg total) by mouth at bedtime.  . metoprolol (TOPROL-XL) 200 MG 24 hr tablet Take 1 tablet (200 mg total) by mouth daily.  . Misc Natural Products (T-RELIEF CBD+13 SL) Place 500 mg under the tongue daily.  . Nutritional Supplements (QUINOA/KALE/HEMP PO) Place 750 mg under the tongue.  . Omega-3 Fatty Acids (FISH OIL) 1200 MG CAPS Take by mouth  daily. 2400 qam 1200 mg qpm  . potassium chloride (KLOR-CON M10) 10 MEQ tablet Take 1 tablet (10 mEq total) by mouth daily.  . Dulaglutide (TRULICITY) 1.5 MGXB/2.8UXOPN Inject 0.5 mLs (1.5 mg total) into the skin once a week.   No facility-administered medications prior to visit.    Review of Systems  Constitutional: Negative.   Respiratory: Negative.   Cardiovascular: Negative.   Endocrine: Negative for polydipsia, polyphagia and polyuria.  Neurological: Negative.         Objective    BP (!) 165/82 (BP Location: Right Arm, Patient Position: Sitting, Cuff Size: Large)   Pulse 62   Temp 98.6 F (37 C) (Oral)   Wt 173 lb (78.5 kg)   BMI 30.65 kg/m    Physical Exam Vitals reviewed.  Constitutional:      General: She is not in  acute distress.    Appearance: Normal appearance. She is well-developed. She is obese. She is not ill-appearing or diaphoretic.  HENT:     Head: Normocephalic and atraumatic.  Cardiovascular:     Rate and Rhythm: Normal rate and regular rhythm.     Heart sounds: Normal heart sounds. No murmur heard. No friction rub. No gallop.   Pulmonary:     Effort: Pulmonary effort is normal. No respiratory distress.     Breath sounds: Normal breath sounds. No wheezing or rales.  Musculoskeletal:     Cervical back: Normal range of motion and neck supple.  Neurological:     Mental Status: She is alert.     Results for orders placed or performed in visit on 03/15/21  HM DIABETES EYE EXAM  Result Value Ref Range   HM Diabetic Eye Exam No Retinopathy No Retinopathy  Results for orders placed or performed in visit on 03/15/21  POCT glycosylated hemoglobin (Hb A1C)  Result Value Ref Range   Hemoglobin A1C 6.5 (A) 4.0 - 5.6 %    Assessment & Plan     1. Primary hypertension Home readings much better. She is also doing Optavia and has lost about 20 pounds per her home scale. Most recent home BP was yesterday and was 120/60. Will continue to monitor with  weight loss as she may be able to decrease BP medications. Advised if BP is < 100/60 she can cut losartan-HCTZ 100-51m in half. She voices understanding. She will f/u in 3-4 months.  2. Type 2 diabetes mellitus with other diabetic kidney complication (HCC) AL8Vmuch improved. Continue Trulicity. F/U in 3 months.  - POCT glycosylated hemoglobin (Hb A1C)   No follow-ups on file.      IReynolds Bowl PA-C, have reviewed all documentation for this visit. The documentation on 03/15/21 for the exam, diagnosis, procedures, and orders are all accurate and complete.   JRubye Beach [phone](phone) [phone](fax)  [place]

## 2021-03-28 ENCOUNTER — Other Ambulatory Visit: Payer: Self-pay | Admitting: Physician Assistant

## 2021-03-28 DIAGNOSIS — I10 Essential (primary) hypertension: Secondary | ICD-10-CM

## 2021-04-12 ENCOUNTER — Ambulatory Visit: Payer: Self-pay | Admitting: Physician Assistant

## 2021-05-10 ENCOUNTER — Telehealth: Payer: Self-pay

## 2021-05-10 NOTE — Progress Notes (Signed)
Chronic Care Management Pharmacy Assistant   Name: Heather Burke  MRN: [id number] DOB: 1951-09-22  Reason for Encounter: Medication Review /General Adherence Call.   Recent office visits:  03/15/2021 Fenton Malling PA-C (PCP) - Advised if BP is < 100/60 she can cut losartan-HCTZ 100-37m in half  [facility] visits:  [facility]  None in previous 6 months  Medications: Outpatient Encounter Medications as of 05/10/2021  Medication Sig  . aspirin 81 MG tablet Take 81 mg by mouth daily.  . Bisacodyl (GENTLE LAXATIVE PO) Take by mouth.  . Blood Glucose Monitoring Suppl (ONETOUCH VERIO) w/Device KIT To check blood sugar daily  . calcium carbonate (OS-CAL) 600 MG TABS tablet Take 1 tablet (600 mg total) by mouth 2 (two) times daily with a meal.  . CANNABIDIOL PO Take 500 mg by mouth daily.  . Cholecalciferol (D3 ADULT PO) Take 1 tablet by mouth daily. 1000 units daily  . Collagen-Vitamin C-Biotin (COLLAGEN 1500/C PO) Take by mouth.  . diltiazem (CARDIZEM SR) 120 MG 12 hr capsule TAKE 1 CAPSULE (120MG) IN THE MORNING AND 2 CAPSULES (240MG) IN THE EVENING  . Dulaglutide (TRULICITY) 1.5 MFK/8.1EXSOPN Inject 0.5 mLs (1.5 mg total) into the skin once a week.  .Marland KitchenFIBER PO Take by mouth 2 (two) times daily.  . fluticasone (FLONASE) 50 MCG/ACT nasal spray SPRAY 2 SPRAYS INTO EACH NOSTRIL EVERY DAY  . glucose blood (ONETOUCH VERIO) test strip To check blood sugar daily  . Lancets (ONETOUCH ULTRASOFT) lancets To check blood sugar daily  . levothyroxine (SYNTHROID) 88 MCG tablet Take 1 tablet (88 mcg total) by mouth daily.  .Marland Kitchenlosartan-hydrochlorothiazide (HYZAAR) 100-25 MG tablet Take 1 tablet by mouth daily.  .Marland Kitchenlovastatin (MEVACOR) 20 MG tablet Take 1 tablet (20 mg total) by mouth at bedtime.  . metoprolol (TOPROL-XL) 200 MG 24 hr tablet Take 1 tablet (200 mg total) by mouth daily.  . Misc Natural Products (T-RELIEF CBD+13 SL) Place 500 mg under the tongue  daily.  . Nutritional Supplements (QUINOA/KALE/HEMP PO) Place 750 mg under the tongue.  . Omega-3 Fatty Acids (FISH OIL) 1200 MG CAPS Take by mouth daily. 2400 qam 1200 mg qpm  . potassium chloride (KLOR-CON M10) 10 MEQ tablet Take 1 tablet (10 mEq total) by mouth daily.   No facility-administered encounter medications on file as of 05/10/2021.    Star Rating Drugs: Trulicity 0.5 mL Patient Assistance  Losartan-HTCZ 100-25 mg last filled on 01/24/2021 [facility]. Lovastatin 20 mg last filled on 03/06/2021 for [facility] day supply [facility].   Called patient and discussed medication adherence  with patient, no issues at this time with current medication.   Patient denies ED visit since her last CPP follow up.  Patient denies any side effects with her medication. Patient denies any problems with her current pharmacy  Patient states she has lost a lot of weight,and she is concern about her Blood pressure.Patient states over the weekend she had some symptoms of lightheadedness and dizziness.   Blood pressure readings:  On 05/09/2021 it was 113/60.  Patient states she has a follow up appointment with her PCP on 06/15/2021 and she will discuss her hypertension medication to see if she can stop taking them.  Schedule patient a telephone follow up appointment with clinical pharmacist on 06/22/2021 at 4:00 pm.Patient states she would like to follow up with Clinical Pharmacist after her appointment with her PCP.Sent meassge to scheduler.  [place] Pharmacist Assistant [phone]

## 2021-05-18 ENCOUNTER — Telehealth: Payer: Self-pay

## 2021-05-18 NOTE — Progress Notes (Signed)
    Chronic Care Management Pharmacy Assistant   Name: Heather Burke  MRN: [id number] DOB: 08-05-1951  Reason for Encounter: Medication [facility] visits:  None in previous 6 months  Medications: Outpatient Encounter Medications as of 05/18/2021  Medication Sig  . aspirin 81 MG tablet Take 81 mg by mouth daily.  . Bisacodyl (GENTLE LAXATIVE PO) Take by mouth.  . Blood Glucose Monitoring Suppl (ONETOUCH VERIO) w/Device KIT To check blood sugar daily  . calcium carbonate (OS-CAL) 600 MG TABS tablet Take 1 tablet (600 mg total) by mouth 2 (two) times daily with a meal.  . CANNABIDIOL PO Take 500 mg by mouth daily.  . Cholecalciferol (D3 ADULT PO) Take 1 tablet by mouth daily. 1000 units daily  . Collagen-Vitamin C-Biotin (COLLAGEN 1500/C PO) Take by mouth.  . diltiazem (CARDIZEM SR) 120 MG 12 hr capsule TAKE 1 CAPSULE ($RemoveBe'120MG'YInguTegh$ ) IN THE MORNING AND 2 CAPSULES ($RemoveBef'240MG'SkBKJhKmVx$ ) IN THE EVENING  . Dulaglutide (TRULICITY) 1.5 VO/5.3GU SOPN Inject 0.5 mLs (1.5 mg total) into the skin once a week.  Marland Kitchen FIBER PO Take by mouth 2 (two) times daily.  . fluticasone (FLONASE) 50 MCG/ACT nasal spray SPRAY 2 SPRAYS INTO EACH NOSTRIL EVERY DAY  . glucose blood (ONETOUCH VERIO) test strip To check blood sugar daily  . Lancets (ONETOUCH ULTRASOFT) lancets To check blood sugar daily  . levothyroxine (SYNTHROID) 88 MCG tablet Take 1 tablet (88 mcg total) by mouth daily.  Marland Kitchen losartan-hydrochlorothiazide (HYZAAR) 100-25 MG tablet Take 1 tablet by mouth daily.  Marland Kitchen lovastatin (MEVACOR) 20 MG tablet Take 1 tablet (20 mg total) by mouth at bedtime.  . metoprolol (TOPROL-XL) 200 MG 24 hr tablet Take 1 tablet (200 mg total) by mouth daily.  . Misc Natural Products (T-RELIEF CBD+13 SL) Place 500 mg under the tongue daily.  . Nutritional Supplements (QUINOA/KALE/HEMP PO) Place 750 mg under the tongue.  . Omega-3 Fatty Acids (FISH OIL) 1200 MG CAPS Take by mouth daily. 2400 qam 1200 mg qpm  . potassium chloride (KLOR-CON  M10) 10 MEQ tablet Take 1 tablet (10 mEq total) by mouth daily.   No facility-administered encounter medications on file as of 05/18/2021.   Performed cost analysis for patient, estimated [facility] compare to  167.16 at [place].  [place] Pharmacist Assistant [phone]

## 2021-06-01 ENCOUNTER — Telehealth: Payer: Self-pay

## 2021-06-01 NOTE — Progress Notes (Signed)
Chronic Care Management Pharmacy Assistant   Name: Heather Burke  MRN: [id number] DOB: 03/11/1951  Reason for Encounter:Hypertension Disease State Call.   Recent office visits:  No recent Office Visit  Recent consult visits:  No recent [facility] visits:  None in previous 6 months  Medications: Outpatient Encounter Medications as of 06/01/2021  Medication Sig   aspirin 81 MG tablet Take 81 mg by mouth daily.   Bisacodyl (GENTLE LAXATIVE PO) Take by mouth.   Blood Glucose Monitoring Suppl (ONETOUCH VERIO) w/Device KIT To check blood sugar daily   calcium carbonate (OS-CAL) 600 MG TABS tablet Take 1 tablet (600 mg total) by mouth 2 (two) times daily with a meal.   CANNABIDIOL PO Take 500 mg by mouth daily.   Cholecalciferol (D3 ADULT PO) Take 1 tablet by mouth daily. 1000 units daily   Collagen-Vitamin C-Biotin (COLLAGEN 1500/C PO) Take by mouth.   diltiazem (CARDIZEM SR) 120 MG 12 hr capsule TAKE 1 CAPSULE (120MG) IN THE MORNING AND 2 CAPSULES (240MG) IN THE EVENING   Dulaglutide (TRULICITY) 1.5 BJ/6.2GB SOPN Inject 0.5 mLs (1.5 mg total) into the skin once a week.   FIBER PO Take by mouth 2 (two) times daily.   fluticasone (FLONASE) 50 MCG/ACT nasal spray SPRAY 2 SPRAYS INTO EACH NOSTRIL EVERY DAY   glucose blood (ONETOUCH VERIO) test strip To check blood sugar daily   Lancets (ONETOUCH ULTRASOFT) lancets To check blood sugar daily   levothyroxine (SYNTHROID) 88 MCG tablet Take 1 tablet (88 mcg total) by mouth daily.   losartan-hydrochlorothiazide (HYZAAR) 100-25 MG tablet Take 1 tablet by mouth daily.   lovastatin (MEVACOR) 20 MG tablet Take 1 tablet (20 mg total) by mouth at bedtime.   metoprolol (TOPROL-XL) 200 MG 24 hr tablet Take 1 tablet (200 mg total) by mouth daily.   Misc Natural Products (T-RELIEF CBD+13 SL) Place 500 mg under the tongue daily.   Nutritional Supplements (QUINOA/KALE/HEMP PO) Place 750 mg under the tongue.   Omega-3 Fatty Acids (FISH  OIL) 1200 MG CAPS Take by mouth daily. 2400 qam 1200 mg qpm   potassium chloride (KLOR-CON M10) 10 MEQ tablet Take 1 tablet (10 mEq total) by mouth daily.   No facility-administered encounter medications on file as of 06/01/2021.    Care Gaps: Annual Wellness visit  Star Rating Drugs: Trulicity 0.5 mL Patient Assistance Losartan-HTCZ 100-25 mg last filled on 04/23/2021 [facility]. Lovastatin 20 mg last filled on 03/06/2021 [facility].   Reviewed chart prior to disease state call. Spoke with patient regarding BP  Recent Office Vitals: BP Readings from Last 3 Encounters:  03/15/21 (!) 165/82  12/28/20 (!) 180/96  09/07/20 (!) 158/69   Pulse Readings from Last 3 Encounters:  03/15/21 62  12/28/20 67  09/07/20 63    Wt Readings from Last 3 Encounters:  03/15/21 173 lb (78.5 kg)  12/28/20 184 lb 4.8 oz (83.6 kg)  09/07/20 186 lb 12.8 oz (84.7 kg)     Kidney Function Lab Results  Component Value Date/Time   CREATININE 0.98 12/04/2019 10:11 AM   CREATININE 0.69 09/18/2018 09:12 AM   GFRNONAA 60 12/04/2019 10:11 AM   GFRAA 69 12/04/2019 10:11 AM    BMP Latest Ref Rng & Units 12/04/2019 09/18/2018 01/16/2018  Glucose 65 - 99 mg/dL 150(H) 114(H) 120(H)  BUN 8 - 27 mg/dL 12 10 12   Creatinine 0.57 - 1.00 mg/dL 0.98 0.69 0.61  BUN/Creat Ratio 12 - 28 12 14  -  Sodium 134 - 144 mmol/L 138 138 135  Potassium 3.5 - 5.2 mmol/L 4.0 4.6 3.4(L)  Chloride 96 - 106 mmol/L 96 95(L) 97(L)  CO2 20 - 29 mmol/L 22 27 25   Calcium 8.7 - 10.3 mg/dL 10.2 9.7 8.9    Current antihypertensive regimen:  Losartan-HCTZ 100-25 mg 1 tablet daily Metoprolol 200 mg 1 tablet daily Diltiazem 120 mg 1 capsule in the morning and 2 capsule in the evening. How often are you checking your Blood Pressure? weekly Current home BP readings: Patient states her blood pressure has been lower since she lost weight. 111/58, 120/60.  What recent interventions/DTPs have  been made by any provider to improve Blood Pressure control since last CPP Visit: None ID Any recent hospitalizations or ED visits since last visit with CPP? No What diet changes have been made to improve Blood Pressure Control?  Patient reports her diet is healthy. What exercise is being done to improve your Blood Pressure Control?  Patient denies exercising. Patient states her lightheadedness and dizziness has improved. Patient is asking if she needs blood work done  before her follow up in two weeks at her PCP office.I informed patient I was not aware if she needed any blood work, but she could call her PCP office to clarify.Patient verbalized understanding.  Adherence Review: Is the patient currently on ACE/ARB medication? Yes Does the patient have >5 day gap between last estimated fill dates? No   Patient is schedule for a telephone follow up with the clinical pharmacist on 06/22/2021 at 4:00 pm. Patient is schedule for her Annual wellness visit on 12/22/2021.  [place] Pharmacist Assistant [phone]

## 2021-06-04 ENCOUNTER — Encounter: Payer: Self-pay | Admitting: Family Medicine

## 2021-06-15 ENCOUNTER — Other Ambulatory Visit: Payer: Self-pay

## 2021-06-15 ENCOUNTER — Encounter: Payer: Self-pay | Admitting: Family Medicine

## 2021-06-15 ENCOUNTER — Ambulatory Visit (INDEPENDENT_AMBULATORY_CARE_PROVIDER_SITE_OTHER): Payer: PPO | Admitting: Family Medicine

## 2021-06-15 VITALS — BP 113/56 | HR 80 | Temp 98.3°F | Resp 16 | Ht 62.0 in | Wt 161.0 lb

## 2021-06-15 DIAGNOSIS — E1159 Type 2 diabetes mellitus with other circulatory complications: Secondary | ICD-10-CM

## 2021-06-15 DIAGNOSIS — E039 Hypothyroidism, unspecified: Secondary | ICD-10-CM | POA: Diagnosis not present

## 2021-06-15 DIAGNOSIS — E876 Hypokalemia: Secondary | ICD-10-CM

## 2021-06-15 DIAGNOSIS — E1169 Type 2 diabetes mellitus with other specified complication: Secondary | ICD-10-CM | POA: Insufficient documentation

## 2021-06-15 DIAGNOSIS — J302 Other seasonal allergic rhinitis: Secondary | ICD-10-CM | POA: Diagnosis not present

## 2021-06-15 DIAGNOSIS — E663 Overweight: Secondary | ICD-10-CM | POA: Diagnosis not present

## 2021-06-15 DIAGNOSIS — I152 Hypertension secondary to endocrine disorders: Secondary | ICD-10-CM

## 2021-06-15 DIAGNOSIS — E785 Hyperlipidemia, unspecified: Secondary | ICD-10-CM | POA: Diagnosis not present

## 2021-06-15 LAB — POCT GLYCOSYLATED HEMOGLOBIN (HGB A1C): Hemoglobin A1C: 6.1 % — AB (ref 4.0–5.6)

## 2021-06-15 MED ORDER — FLUTICASONE PROPIONATE 50 MCG/ACT NA SUSP: NASAL | 2 refills | Status: DC

## 2021-06-15 MED ORDER — LOSARTAN POTASSIUM-HCTZ 100-25 MG PO TABS
1.0000 | ORAL_TABLET | Freq: Every day | ORAL | 3 refills | Status: DC
Start: 2021-06-15 — End: 2022-05-17

## 2021-06-15 MED ORDER — LEVOTHYROXINE SODIUM 88 MCG PO TABS: 88.0000 ug | ORAL_TABLET | Freq: Every day | ORAL | 3 refills | Status: DC

## 2021-06-15 MED ORDER — METOPROLOL SUCCINATE ER 200 MG PO TB24: 200.0000 mg | ORAL_TABLET | Freq: Every day | ORAL | 3 refills | Status: DC

## 2021-06-15 MED ORDER — POTASSIUM CHLORIDE CRYS ER 10 MEQ PO TBCR: 10.0000 meq | EXTENDED_RELEASE_TABLET | Freq: Every day | ORAL | 3 refills | Status: DC

## 2021-06-15 MED ORDER — LOVASTATIN 20 MG PO TABS: 20.0000 mg | ORAL_TABLET | Freq: Every day | ORAL | 3 refills | Status: DC

## 2021-06-15 NOTE — Assessment & Plan Note (Signed)
Well controlled Foot exam today Continue current meds, but consider decreasing trulicity if Q9U remains low

## 2021-06-15 NOTE — Assessment & Plan Note (Signed)
Congratulated on weight loss ?Discussed importance of healthy weight management ?Discussed diet and exercise  ?

## 2021-06-15 NOTE — Assessment & Plan Note (Signed)
Well controlled on home readings Continue current medications Recheck metabolic panel

## 2021-06-15 NOTE — Progress Notes (Signed)
Established patient visit   Patient: Heather Burke   DOB: 30-Apr-1951   70 y.o. Female  MRN: [id number] Visit Date: 06/15/2021  Today's healthcare provider: Lavon Paganini, MD   Chief Complaint  Patient presents with   Diabetes   Hypertension   Subjective    Diabetes Pertinent negatives for hypoglycemia include no dizziness or headaches. Pertinent negatives for diabetes include no chest pain, no fatigue and no weakness.  Hypertension Pertinent negatives include no chest pain, headaches, neck pain, palpitations or shortness of breath.    Diabetes Mellitus Type II, Follow-up  Lab Results  Component Value Date   HGBA1C 6.1 (A) 06/15/2021   HGBA1C 6.5 (A) 03/15/2021   HGBA1C 7.7 (A) 12/28/2020   Wt Readings from Last 3 Encounters:  06/15/21 161 lb (73 kg)  03/15/21 173 lb (78.5 kg)  12/28/20 184 lb 4.8 oz (83.6 kg)   Last seen for diabetes 3 months ago by Grace Bushy, PA.  Management since then includes no medication changes. Continue Trulicity.  She reports good compliance with treatment. She is not having side effects.   Symptoms: No fatigue No foot ulcerations  No appetite changes No nausea  No paresthesia of the feet  No polydipsia  No polyuria No visual disturbances   No vomiting    Home blood sugar records: trend: stable  Episodes of hypoglycemia? No  Most Recent Eye Exam: 12/2020 Current exercise: walking Current diet habits: low carb  Pertinent Labs: Lab Results  Component Value Date   CHOL 168 12/04/2019   HDL 37 (L) 12/04/2019   LDLCALC 95 12/04/2019   TRIG 212 (H) 12/04/2019   CHOLHDL 4.5 (H) 12/04/2019   Lab Results  Component Value Date   NA 138 12/04/2019   K 4.0 12/04/2019   CREATININE 0.98 12/04/2019   GFRNONAA 60 12/04/2019   GFRAA 69 12/04/2019   GLUCOSE 150 (H) 12/04/2019     --------------------------------------------------------------------------------------------------- Hypertension, follow-up  BP Readings from  Last 3 Encounters:  06/15/21 (!) 113/56  03/15/21 (!) 165/82  12/28/20 (!) 180/96   Wt Readings from Last 3 Encounters:  06/15/21 161 lb (73 kg)  03/15/21 173 lb (78.5 kg)  12/28/20 184 lb 4.8 oz (83.6 kg)     She was last seen for hypertension 3 months ago.  BP at that visit was 165/82. Management since that visit includes no medication changes. Continue to monitor at home.   She reports good compliance with treatment. She is not having side effects.  She is following a Low Sodium, low carb  diet. She is exercising. She does not smoke.  Use of agents associated with hypertension: none.   Outside blood pressures are checked daily. Patient reports that BP average in the 120s/70s at home. She brought readings in for review today.  Symptoms: No chest pain No chest pressure  No palpitations No syncope  No dyspnea No orthopnea  No paroxysmal nocturnal dyspnea No lower extremity edema   Pertinent labs: Lab Results  Component Value Date   CHOL 168 12/04/2019   HDL 37 (L) 12/04/2019   LDLCALC 95 12/04/2019   TRIG 212 (H) 12/04/2019   CHOLHDL 4.5 (H) 12/04/2019   Lab Results  Component Value Date   NA 138 12/04/2019   K 4.0 12/04/2019   CREATININE 0.98 12/04/2019   GFRNONAA 60 12/04/2019   GFRAA 69 12/04/2019   GLUCOSE 150 (H) 12/04/2019     The 10-year ASCVD risk score Mikey Bussing DC Jr., et al., 2013)  is: 27.2%      Medications: Outpatient Medications Prior to Visit  Medication Sig   aspirin 81 MG tablet Take 81 mg by mouth daily.   Bisacodyl (GENTLE LAXATIVE PO) Take by mouth.   Blood Glucose Monitoring Suppl (ONETOUCH VERIO) w/Device KIT To check blood sugar daily   calcium carbonate (OS-CAL) 600 MG TABS tablet Take 1 tablet (600 mg total) by mouth 2 (two) times daily with a meal.   Cholecalciferol (D3 ADULT PO) Take 1 tablet by mouth daily. 1000 units daily   Collagen-Vitamin C-Biotin (COLLAGEN 1500/C PO) Take by mouth.   diltiazem (CARDIZEM SR) 120 MG 12 hr capsule  TAKE 1 CAPSULE ($RemoveBe'120MG'RTGhnWLid$ ) IN THE MORNING AND 2 CAPSULES ($RemoveBef'240MG'GhgqqWQtyB$ ) IN THE EVENING   Dulaglutide (TRULICITY) 1.5 IY/6.4BR SOPN Inject 0.5 mLs (1.5 mg total) into the skin once a week.   FIBER PO Take by mouth 2 (two) times daily.   glucose blood (ONETOUCH VERIO) test strip To check blood sugar daily   Lancets (ONETOUCH ULTRASOFT) lancets To check blood sugar daily   Misc Natural Products (T-RELIEF CBD+13 SL) Place 500 mg under the tongue daily.   Nutritional Supplements (QUINOA/KALE/HEMP PO) Place 750 mg under the tongue.   Omega-3 Fatty Acids (FISH OIL) 1200 MG CAPS Take by mouth daily. 2400 qam 1200 mg qpm   [DISCONTINUED] fluticasone (FLONASE) 50 MCG/ACT nasal spray SPRAY 2 SPRAYS INTO EACH NOSTRIL EVERY DAY   [DISCONTINUED] levothyroxine (SYNTHROID) 88 MCG tablet Take 1 tablet (88 mcg total) by mouth daily.   [DISCONTINUED] losartan-hydrochlorothiazide (HYZAAR) 100-25 MG tablet Take 1 tablet by mouth daily.   [DISCONTINUED] lovastatin (MEVACOR) 20 MG tablet Take 1 tablet (20 mg total) by mouth at bedtime.   [DISCONTINUED] metoprolol (TOPROL-XL) 200 MG 24 hr tablet Take 1 tablet (200 mg total) by mouth daily.   [DISCONTINUED] potassium chloride (KLOR-CON M10) 10 MEQ tablet Take 1 tablet (10 mEq total) by mouth daily.   CANNABIDIOL PO Take 500 mg by mouth daily. (Patient not taking: Reported on 06/15/2021)   No facility-administered medications prior to visit.   Review of Systems  Constitutional:  Negative for activity change, chills, fatigue and fever.  HENT:  Negative for ear pain, sinus pressure, sinus pain and sore throat.   Eyes:  Negative for pain and visual disturbance.  Respiratory:  Negative for cough, chest tightness, shortness of breath and wheezing.   Cardiovascular:  Negative for chest pain, palpitations and leg swelling.  Gastrointestinal:  Negative for abdominal pain, blood in stool, diarrhea, nausea and vomiting.  Genitourinary:  Negative for dysuria, flank pain, frequency, pelvic  pain and urgency.  Musculoskeletal:  Negative for arthralgias, back pain, myalgias and neck pain.  Neurological:  Negative for dizziness, weakness, light-headedness, numbness and headaches.  Psychiatric/Behavioral:  Negative for agitation, self-injury, sleep disturbance and suicidal ideas.       Objective    BP (!) 113/56 Comment: home reading  Pulse 80   Temp 98.3 F (36.8 C)   Resp 16   Ht $R'5\' 2"'yd$  (1.575 m)   Wt 161 lb (73 kg)   BMI 29.45 kg/m     Physical Exam Vitals reviewed.  Constitutional:      General: She is not in acute distress.    Appearance: Normal appearance. She is well-developed. She is not diaphoretic.  HENT:     Head: Normocephalic and atraumatic.  Eyes:     General: No scleral icterus.    Conjunctiva/sclera: Conjunctivae normal.  Neck:     Thyroid: No  thyromegaly.  Cardiovascular:     Rate and Rhythm: Normal rate and regular rhythm.     Pulses: Normal pulses.     Heart sounds: Normal heart sounds. No murmur heard. Pulmonary:     Effort: Pulmonary effort is normal. No respiratory distress.     Breath sounds: Normal breath sounds. No wheezing, rhonchi or rales.  Musculoskeletal:     Cervical back: Neck supple.     Right lower leg: No edema.     Left lower leg: No edema.  Feet:     Comments: Diabetic Foot Exam - Simple   Simple Foot Form Diabetic Foot exam was performed with the following findings: Yes  06/15/2021 11:34 AM  Visual Inspection No deformities, no ulcerations, no other skin breakdown bilaterally: Yes Sensation Testing Intact to touch and monofilament testing bilaterally: Yes Pulse Check Posterior Tibialis and Dorsalis pulse intact bilaterally: Yes Comments       Lymphadenopathy:     Cervical: No cervical adenopathy.  Skin:    General: Skin is warm and dry.     Findings: No rash.  Neurological:     Mental Status: She is alert and oriented to person, place, and time. Mental status is at baseline.  Psychiatric:        Mood and  Affect: Mood normal.        Behavior: Behavior normal.    Results for orders placed or performed in visit on 06/15/21  POCT glycosylated hemoglobin (Hb A1C)  Result Value Ref Range   Hemoglobin A1C 6.1 (A) 4.0 - 5.6 %   HbA1c POC (<> result, manual entry)     HbA1c, POC (prediabetic range)     HbA1c, POC (controlled diabetic range)      Assessment & Plan     Problem List Items Addressed This Visit       Cardiovascular and Mediastinum   Hypertension associated with diabetes ([place])    Well controlled on home readings Continue current medications Recheck metabolic panel       Relevant Medications   metoprolol (TOPROL-XL) 200 MG 24 hr tablet   lovastatin (MEVACOR) 20 MG tablet   losartan-hydrochlorothiazide (HYZAAR) 100-25 MG tablet   Other Relevant Orders   Comprehensive metabolic panel     Respiratory   Allergic rhinitis   Relevant Medications   fluticasone (FLONASE) 50 MCG/ACT nasal spray     Endocrine   Type 2 diabetes mellitus with other diabetic kidney complication ([place]) - Primary    Well controlled Foot exam today Continue current meds, but consider decreasing trulicity if O8N remains low       Relevant Medications   lovastatin (MEVACOR) 20 MG tablet   losartan-hydrochlorothiazide (HYZAAR) 100-25 MG tablet   Hypothyroidism    Previously well controlled Continue Synthroid at current dose  Recheck TSH and adjust Synthroid as indicated         Relevant Medications   metoprolol (TOPROL-XL) 200 MG 24 hr tablet   levothyroxine (SYNTHROID) 88 MCG tablet   Other Relevant Orders   TSH   Hyperlipidemia associated with type 2 diabetes mellitus ([facility])    Previously well controlled Continue statin Repeat FLP and CMP Goal LDL < 70       Relevant Medications   lovastatin (MEVACOR) 20 MG tablet   losartan-hydrochlorothiazide (HYZAAR) 100-25 MG tablet   Other Relevant Orders   Comprehensive metabolic panel   Lipid panel     Other   Overweight     Congratulated on weight loss Discussed importance  of healthy weight management Discussed diet and exercise        Other Visit Diagnoses     Hypokalemia       Relevant Medications   potassium chloride (KLOR-CON M10) 10 MEQ tablet      Return in about 6 months (around 12/15/2021) for CPE, call back in 1 month to schedule with new PCP.      I,Essence Turner,acting as a Education administrator for Lavon Paganini, MD.,have documented all relevant documentation on the behalf of Lavon Paganini, MD,as directed by  Lavon Paganini, MD while in the presence of Lavon Paganini, MD.  I, Lavon Paganini, MD, have reviewed all documentation for this visit. The documentation on 06/15/21 for the exam, diagnosis, procedures, and orders are all accurate and complete.   Jasper Hanf, Dionne Bucy, MD, MPH [facility]

## 2021-06-15 NOTE — Assessment & Plan Note (Signed)
Previously well controlled Continue Synthroid at current dose  Recheck TSH and adjust Synthroid as indicated   

## 2021-06-15 NOTE — Assessment & Plan Note (Signed)
Previously well controlled Continue statin Repeat FLP and CMP Goal LDL < 70 

## 2021-06-17 DIAGNOSIS — E785 Hyperlipidemia, unspecified: Secondary | ICD-10-CM | POA: Diagnosis not present

## 2021-06-17 DIAGNOSIS — E1169 Type 2 diabetes mellitus with other specified complication: Secondary | ICD-10-CM | POA: Diagnosis not present

## 2021-06-17 DIAGNOSIS — E1159 Type 2 diabetes mellitus with other circulatory complications: Secondary | ICD-10-CM | POA: Diagnosis not present

## 2021-06-17 DIAGNOSIS — I152 Hypertension secondary to endocrine disorders: Secondary | ICD-10-CM | POA: Diagnosis not present

## 2021-06-17 DIAGNOSIS — E039 Hypothyroidism, unspecified: Secondary | ICD-10-CM | POA: Diagnosis not present

## 2021-06-18 LAB — COMPREHENSIVE METABOLIC PANEL
ALT: 34 IU/L — ABNORMAL HIGH (ref 0–32)
AST: 26 IU/L (ref 0–40)
Albumin/Globulin Ratio: 1.2 (ref 1.2–2.2)
Albumin: 4.1 g/dL (ref 3.8–4.8)
Alkaline Phosphatase: 76 IU/L (ref 44–121)
BUN/Creatinine Ratio: 26 (ref 12–28)
BUN: 18 mg/dL (ref 8–27)
Bilirubin Total: 0.4 mg/dL (ref 0.0–1.2)
CO2: 26 mmol/L (ref 20–29)
Calcium: 10 mg/dL (ref 8.7–10.3)
Chloride: 96 mmol/L (ref 96–106)
Creatinine, Ser: 0.68 mg/dL (ref 0.57–1.00)
Globulin, Total: 3.4 g/dL (ref 1.5–4.5)
Glucose: 93 mg/dL (ref 65–99)
Potassium: 4.1 mmol/L (ref 3.5–5.2)
Sodium: 138 mmol/L (ref 134–144)
Total Protein: 7.5 g/dL (ref 6.0–8.5)
eGFR: 94 mL/min/{1.73_m2} (ref >59)

## 2021-06-18 LAB — LIPID PANEL
Chol/HDL Ratio: 4.5 ratio — ABNORMAL HIGH (ref 0.0–4.4)
Cholesterol, Total: 139 mg/dL (ref 100–199)
HDL: 31 mg/dL — ABNORMAL LOW (ref >39)
LDL Chol Calc (NIH): 75 mg/dL (ref 0–99)
Triglycerides: 192 mg/dL — ABNORMAL HIGH (ref 0–149)
VLDL Cholesterol Cal: 33 mg/dL (ref 5–40)

## 2021-06-18 LAB — TSH: TSH: 2.06 u[IU]/mL (ref 0.450–4.500)

## 2021-06-22 ENCOUNTER — Telehealth: Payer: PPO

## 2021-06-28 ENCOUNTER — Telehealth: Payer: Self-pay

## 2021-06-28 DIAGNOSIS — E113393 Type 2 diabetes mellitus with moderate nonproliferative diabetic retinopathy without macular edema, bilateral: Secondary | ICD-10-CM | POA: Diagnosis not present

## 2021-06-28 DIAGNOSIS — H16223 Keratoconjunctivitis sicca, not specified as Sjogren's, bilateral: Secondary | ICD-10-CM | POA: Diagnosis not present

## 2021-06-28 LAB — HM DIABETES EYE EXAM

## 2021-06-28 NOTE — Chronic Care Management (AMB) (Signed)
    Chronic Care Management Pharmacy Assistant   Name: Heather Burke  MRN: [id number] DOB: [birth year]   Reason for Encounter: Patient Assistance Coordination   06/28/2021- Patient assistance applications filled out for Trulicity with [employer] Patient assistance program. Application mailed to patient. Patient to return form to PCP office for Dr Brita Romp to sign and Cristie Hem to fax.  07/15/2021- Called patient to follow up on patient assistance application, no answer, left message to return call.  Medications: Outpatient Encounter Medications as of 06/28/2021  Medication Sig   aspirin 81 MG tablet Take 81 mg by mouth daily.   Bisacodyl (GENTLE LAXATIVE PO) Take by mouth.   Blood Glucose Monitoring Suppl (ONETOUCH VERIO) w/Device KIT To check blood sugar daily   calcium carbonate (OS-CAL) 600 MG TABS tablet Take 1 tablet (600 mg total) by mouth 2 (two) times daily with a meal.   CANNABIDIOL PO Take 500 mg by mouth daily. (Patient not taking: Reported on 06/15/2021)   Cholecalciferol (D3 ADULT PO) Take 1 tablet by mouth daily. 1000 units daily   Collagen-Vitamin C-Biotin (COLLAGEN 1500/C PO) Take by mouth.   diltiazem (CARDIZEM SR) 120 MG 12 hr capsule TAKE 1 CAPSULE ($RemoveBe'120MG'erpqTdozr$ ) IN THE MORNING AND 2 CAPSULES ($RemoveBef'240MG'eDGBjGWkyn$ ) IN THE EVENING   Dulaglutide (TRULICITY) 1.5 FG/9.0SX SOPN Inject 0.5 mLs (1.5 mg total) into the skin once a week.   FIBER PO Take by mouth 2 (two) times daily.   fluticasone (FLONASE) 50 MCG/ACT nasal spray SPRAY 2 SPRAYS INTO EACH NOSTRIL EVERY DAY   glucose blood (ONETOUCH VERIO) test strip To check blood sugar daily   Lancets (ONETOUCH ULTRASOFT) lancets To check blood sugar daily   levothyroxine (SYNTHROID) 88 MCG tablet Take 1 tablet (88 mcg total) by mouth daily.   losartan-hydrochlorothiazide (HYZAAR) 100-25 MG tablet Take 1 tablet by mouth daily.   lovastatin (MEVACOR) 20 MG tablet Take 1 tablet (20 mg total) by mouth at bedtime.   metoprolol (TOPROL-XL) 200 MG 24 hr  tablet Take 1 tablet (200 mg total) by mouth daily.   Misc Natural Products (T-RELIEF CBD+13 SL) Place 500 mg under the tongue daily.   Nutritional Supplements (QUINOA/KALE/HEMP PO) Place 750 mg under the tongue.   Omega-3 Fatty Acids (FISH OIL) 1200 MG CAPS Take by mouth daily. 2400 qam 1200 mg qpm   potassium chloride (KLOR-CON M10) 10 MEQ tablet Take 1 tablet (10 mEq total) by mouth daily.   No facility-administered encounter medications on file as of 06/28/2021.    Care Gaps: Last Annual Wellness visit 12/14/2020, next scheduled 12/23/2019.  Patient has a follow up visit with CCM Pharmacist [name] 08/10/2021 at 4pm.  Star Rating Drugs: Trulicity- Last filled 12/19/5518- PAP Losartan- HCTZ 100/25 mg- Last filled 04/23/2021 for a 90 day supply at [facility] Lovastatin 20 mg- Last filled   Pattricia Boss, [place] Pharmacist Assistant [phone]

## 2021-07-30 ENCOUNTER — Ambulatory Visit (INDEPENDENT_AMBULATORY_CARE_PROVIDER_SITE_OTHER): Payer: PPO

## 2021-07-30 DIAGNOSIS — E1129 Type 2 diabetes mellitus with other diabetic kidney complication: Secondary | ICD-10-CM

## 2021-07-30 NOTE — Progress Notes (Signed)
[employer] Patient Assistance form for Trulicity completed by patient and faxed for review on 07/30/21

## 2021-08-09 ENCOUNTER — Telehealth: Payer: Self-pay

## 2021-08-09 NOTE — Progress Notes (Signed)
    Chronic Care Management Pharmacy Assistant   Name: Heather Burke  MRN: [id number] DOB: 1951/09/06  Patient called to be reminded of her appointment with [name], CPP on 08/10/2021 @ 1600 via telephone.  Patient aware of appointment date, time, and type of appointment (either telephone or in person). Patient aware to have/bring all medications, supplements, blood pressure and/or blood sugar logs to visit.  Questions: Are there any concerns you would like to discuss during your office visit? No concerns she just has still working on her weight loss so she can hopefully come off some of the medications.   If patient has any PAP medications ask if they are having any problems getting their PAP medication or refill? She is unsure she stated she hasn't heard anything recently but she is going to check tomorrow to see how many boxes she has left.   I called [facility]  @ [phone] to check on the status of patient Trulicity and spoke with Ramona and she stated that the patient has 3 remaining refills left. She stated the last order went out June 22nd and the next refill day is 09/06/2021  Star Rating Drug: Lovastatin 20 mg last filled on 06/05/2021 for a [facility]-Day supply with [facility] Losartan-HCTZ 100-25 mg last filled on 06/30/2021 for a 90-Day supply with [facility] Trulicity 1.5 mg possible PAP  Any gaps in medications fill history? No  Care Gaps: Influenza Vaccine  Lynann Bologna, CPA/CMA Clinical Pharmacist Assistant Phone: [phone]

## 2021-08-10 ENCOUNTER — Ambulatory Visit: Payer: PPO

## 2021-08-10 DIAGNOSIS — E1129 Type 2 diabetes mellitus with other diabetic kidney complication: Secondary | ICD-10-CM | POA: Diagnosis not present

## 2021-08-10 DIAGNOSIS — I10 Essential (primary) hypertension: Secondary | ICD-10-CM | POA: Diagnosis not present

## 2021-08-10 DIAGNOSIS — E1159 Type 2 diabetes mellitus with other circulatory complications: Secondary | ICD-10-CM

## 2021-08-10 DIAGNOSIS — I152 Hypertension secondary to endocrine disorders: Secondary | ICD-10-CM

## 2021-08-10 NOTE — Progress Notes (Signed)
Chronic Care Management Pharmacy Note  08/12/2021 Name:  Heather Burke MRN:  [id number] DOB:  1951/06/28  Summary: Patient presents for CCM follow-up. She reports her blood pressures have remained in excellent control, with borderline low pulse readings.   Recommendations/Changes made from today's visit: DECREASE Diltiazem to 120 mg twice daily  Increase BP monitoring to three times weekly   Plan: CPP Follow-up 1 month   Subjective: Heather Burke is an 70 y.o. year old female who is a primary patient of Gwyneth Sprout, FNP.  The CCM team was consulted for assistance with disease management and care coordination needs.    Engaged with patient by telephone for follow up visit in response to provider referral for pharmacy case management and/or care coordination services.   Consent to Services:  The patient was given information about Chronic Care Management services, agreed to services, and gave verbal consent prior to initiation of services.  Please see initial visit note for detailed documentation.   Patient Care Team: Gwyneth Sprout, FNP as PCP - General (Family Medicine) Lorelee Cover., MD as Consulting Physician (Ophthalmology)  Recent office visits: 06/15/21: Patient presented to Dr. Brita Romp for follow-up. A1c 6.1%.   Recent consult visits: None in previous 6 months  Hospital visits: None in previous 6 months   Objective:  Lab Results  Component Value Date   CREATININE 0.68 06/17/2021   BUN 18 06/17/2021   GFRNONAA 60 12/04/2019   GFRAA 69 12/04/2019   NA 138 06/17/2021   K 4.1 06/17/2021   CALCIUM 10.0 06/17/2021   CO2 26 06/17/2021   GLUCOSE 93 06/17/2021    Lab Results  Component Value Date/Time   HGBA1C 6.1 (A) 06/15/2021 11:18 AM   HGBA1C 6.5 (A) 03/15/2021 10:35 AM   HGBA1C 6.9 (H) 12/04/2019 10:11 AM   HGBA1C 6.8 (H) 09/18/2018 09:12 AM   MICROALBUR Negative 09/29/2015 11:26 AM    Last diabetic Eye exam:  Lab Results  Component  Value Date/Time   HMDIABEYEEXA No Retinopathy 01/12/2021 12:00 AM    Last diabetic Foot exam: No results found for: HMDIABFOOTEX   Lab Results  Component Value Date   CHOL 139 06/17/2021   HDL 31 (L) 06/17/2021   LDLCALC 75 06/17/2021   TRIG 192 (H) 06/17/2021   CHOLHDL 4.5 (H) 06/17/2021    Hepatic Function Latest Ref Rng & Units 06/17/2021 12/04/2019 09/18/2018  Total Protein 6.0 - 8.5 g/dL 7.5 7.6 7.4  Albumin 3.8 - 4.8 g/dL 4.1 4.3 4.4  AST 0 - 40 IU/L 26 55(H) 51(H)  ALT 0 - 32 IU/L 34(H) 43(H) 58(H)  Alk Phosphatase 44 - 121 IU/L 76 64 54  Total Bilirubin 0.0 - 1.2 mg/dL 0.4 0.5 0.2    Lab Results  Component Value Date/Time   TSH 2.060 06/17/2021 01:38 PM   TSH 2.290 12/04/2019 10:11 AM    CBC Latest Ref Rng & Units 12/04/2019 09/18/2018 01/16/2018  WBC 3.4 - 10.8 x10E3/uL 7.0 6.6 7.4  Hemoglobin 11.1 - 15.9 g/dL 15.1 15.2 14.6  Hematocrit 34.0 - 46.6 % 43.8 43.5 41.9  Platelets 150 - 450 x10E3/uL 237 241 217    Lab Results  Component Value Date/Time   VD25OH 48.2 09/18/2018 09:12 AM   VD25OH 52.7 04/20/2016 08:04 AM    Clinical ASCVD: No  The 10-year ASCVD risk score Mikey Bussing DC Jr., et al., 2013) is: 26.6%   Values used to calculate the score:     Age: 54 years  Sex: Female     Is Non-Hispanic African American: No     Diabetic: Yes     Tobacco smoker: Yes     Systolic Blood Pressure: 580 mmHg     Is BP treated: Yes     HDL Cholesterol: 31 mg/dL     Total Cholesterol: 139 mg/dL    Depression screen [facility] 2/9 03/15/2021 12/14/2020 06/04/2020  Decreased Interest 0 0 0  Down, Depressed, Hopeless 0 0 0  PHQ - 2 Score 0 0 0  Altered sleeping 0 - -  Tired, decreased energy 0 - -  Change in appetite 0 - -  Feeling bad or failure about yourself  0 - -  Trouble concentrating 0 - -  Moving slowly or fidgety/restless 0 - -  Suicidal thoughts 0 - -  PHQ-9 Score 0 - -  Difficult doing work/chores Not difficult at all - -     Social History   Tobacco Use   Smoking Status Every Day   Packs/day: 2.00   Years: 49.00   Pack years: 98.00   Types: Cigarettes  Smokeless Tobacco Never   BP Readings from Last 3 Encounters:  06/15/21 (!) 113/56  03/15/21 (!) 165/82  12/28/20 (!) 180/96   Pulse Readings from Last 3 Encounters:  06/15/21 80  03/15/21 62  12/28/20 67   Wt Readings from Last 3 Encounters:  06/15/21 161 lb (73 kg)  03/15/21 173 lb (78.5 kg)  12/28/20 184 lb 4.8 oz (83.6 kg)   BMI Readings from Last 3 Encounters:  06/15/21 29.45 kg/m  03/15/21 30.65 kg/m  12/28/20 32.65 kg/m    Assessment/Interventions: Review of patient past medical history, allergies, medications, health status, including review of consultants reports, laboratory and other test data, was performed as part of comprehensive evaluation and provision of chronic care management services.   SDOH:  (Social Determinants of Health) assessments and interventions performed: Yes SDOH Interventions    Flowsheet Row Most Recent Value  SDOH Interventions   Financial Strain Interventions Intervention Not Indicated      SDOH Screenings   Alcohol Screen: Low Risk    Last Alcohol Screening Score (AUDIT): 0  Depression (PHQ2-9): Low Risk    PHQ-2 Score: 0  Financial Resource Strain: Low Risk    Difficulty of Paying Living Expenses: Not hard at all  Food Insecurity: No Food Insecurity   Worried About Charity fundraiser in the Last Year: Never true   Ran Out of Food in the Last Year: Never true  Housing: Low Risk    Last Housing Risk Score: 0  Physical Activity: Inactive   Days of Exercise per Week: 0 days   Minutes of Exercise per Session: 0 min  Social Connections: Socially Isolated   Frequency of Communication with Friends and Family: More than three times a week   Frequency of Social Gatherings with Friends and Family: More than three times a week   Attends Religious Services: Never   Marine scientist or Organizations: No   Attends Theatre manager Meetings: Never   Marital Status: Widowed  Stress: No Stress Concern Present   Feeling of Stress : Only a little  Tobacco Use: High Risk   Smoking Tobacco Use: Every Day   Smokeless Tobacco Use: Never  Transportation Needs: No Transportation Needs   Lack of Transportation (Medical): No   Lack of Transportation (Non-Medical): No    CCM Care Plan  Allergies  Allergen Reactions   Amlodipine Nausea Only  and Other (See Comments)    Stomach cramping   Atorvastatin Nausea And Vomiting   Lisinopril Cough   Pravastatin Nausea And Vomiting   Nifedipine Er Other (See Comments)    headache    Medications Reviewed Today     Reviewed by Virginia Crews, MD (Physician) on 06/15/21 at 1132  Med List Status: <None>   Medication Order Taking? Sig Documenting Provider Last Dose Status Informant  aspirin 81 MG tablet [id number] Yes Take 81 mg by mouth daily. [provider] Taking Active   Bisacodyl (GENTLE LAXATIVE PO) [id number] Yes Take by mouth. [provider] Taking Active   Blood Glucose Monitoring Suppl [facility] VERIO) w/Device KIT [id number] Yes To check blood sugar daily [name], [place] Taking Active   calcium carbonate (OS-CAL) 600 MG TABS tablet [id number] Yes Take 1 tablet (600 mg total) by mouth 2 (two) times daily with a meal. Forest Gleason, MD Taking Active   CANNABIDIOL PO [id number] No Take 500 mg by mouth daily.  Patient not taking: Reported on 06/15/2021   [provider] Not Taking Active   Cholecalciferol (D3 ADULT PO) [id number] Yes Take 1 tablet by mouth daily. 1000 units daily [provider] Taking Active   Collagen-Vitamin C-Biotin (COLLAGEN 1500/C PO) [id number] Yes Take by mouth. [provider] Taking Active   diltiazem (CARDIZEM SR) 120 MG 12 hr capsule [id number] Yes TAKE 1 CAPSULE (120MG) IN THE MORNING AND 2 CAPSULES (240MG) IN THE EVENING Bacigalupo, Dionne Bucy, MD Taking Active   Dulaglutide  (TRULICITY) 1.5 SK/8.7GO SOPN [id number] Yes Inject 0.5 mLs (1.5 mg total) into the skin once a week. [place] Taking Active   FIBER PO [id number] Yes Take by mouth 2 (two) times daily. [provider] Taking Active   fluticasone (FLONASE) 50 MCG/ACT nasal spray [id number]  SPRAY 2 [place] NOSTRIL EVERY DAY Bacigalupo, Dionne Bucy, MD  Active   glucose blood [facility] VERIO) test strip [id number] Yes To check blood sugar daily [name], PA-C Taking Active   Lancets [facility] ULTRASOFT) lancets [id number] Yes To check blood sugar daily [name], PA-C Taking Active   levothyroxine (SYNTHROID) 88 MCG tablet [id number]  Take 1 tablet (88 mcg total) by mouth daily. Virginia Crews, MD  Active   losartan-hydrochlorothiazide [facility]) 100-25 MG tablet [id number]  Take 1 tablet by mouth daily. Virginia Crews, MD  Active   lovastatin (MEVACOR) 20 MG tablet [id number]  Take 1 tablet (20 mg total) by mouth at bedtime. Virginia Crews, MD  Active   metoprolol (TOPROL-XL) 200 MG 24 hr tablet [id number]  Take 1 tablet (200 mg total) by mouth daily. Virginia Crews, MD  Active   Misc Natural Products (T-RELIEF CBD+13 SL) [id number] Yes Place 500 mg under the tongue daily. [provider] Taking Active Self  Nutritional Supplements (QUINOA/KALE/HEMP PO) [id number] Yes Place 750 mg under the tongue. [provider] Taking Active   Omega-3 Fatty Acids (FISH OIL) 1200 MG CAPS [id number] Yes Take by mouth daily. 2400 qam 1200 mg qpm [provider] Taking Active   potassium chloride (KLOR-CON M10) 10 MEQ tablet [id number]  Take 1 tablet (10 mEq total) by mouth daily. Virginia Crews, MD  Active             Patient Active Problem List   Diagnosis Date Noted   Hyperlipidemia associated with type 2 diabetes mellitus ([place]) 06/15/2021   Overweight 06/15/2021   Statin intolerance 12/29/2020  Benign neoplasm of sigmoid  colon    Mass of right breast 02/17/2016   History of parotitis 01/05/2016   Hypothyroidism 09/29/2015   Hyperlipemia 09/29/2015   Newly recognized murmur 09/29/2015   Allergic rhinitis 08/27/2015   Anxiety 08/27/2015   Type 2 diabetes mellitus with other diabetic kidney complication ([place]) 10/93/2355   Avitaminosis D 08/27/2015   Compulsive tobacco user syndrome 08/27/2015   Hypertension associated with diabetes ([place]) 07/01/2015    Immunization History  Administered Date(s) Administered   Fluad Quad(high Dose 65+) 10/15/2020   Influenza Split 10/21/2010, 10/13/2012   Influenza, High Dose Seasonal PF 10/04/2018   Influenza,inj,Quad PF,6+ Mos 10/02/2014, 09/09/2015   Influenza-Unspecified 09/21/2016, 10/03/2017, 09/20/2019   PFIZER Comirnaty(Gray Top)Covid-19 Tri-Sucrose Vaccine 07/13/2021   PFIZER(Purple Top)SARS-COV-2 Vaccination 02/18/2020, 02/23/2020, 09/10/2020   Pneumococcal Conjugate-13 07/21/2017   Pneumococcal Polysaccharide-23 01/22/2013, 08/23/2018   Tdap 10/21/2010   Zoster Recombinat (Shingrix) 04/27/2020, 01/27/2021   Zoster, Live 10/03/2013    Conditions to be addressed/monitored:  Hypertension, Hyperlipidemia, Diabetes, and Hypothyroidism  Care Plan : General Pharmacy (Adult)  Updates made by Germaine Pomfret, [place] since 08/12/2021 12:00 AM     Problem: Hypertension, Hyperlipidemia, Diabetes, and Hypothyroidism   Priority: High     Long-Range Goal: Patient-Specific Goal   Start Date: 08/12/2021  Expected End Date: 08/12/2022  This Visit's Progress: On track  Priority: High  Note:   Current Barriers:  Unable to independently afford treatment regimen  Pharmacist Clinical Goal(s):  Patient will verbalize ability to afford treatment regimen maintain control of blood pressure as evidenced by BP less than 140/90  through collaboration with PharmD and provider.   Interventions: 1:1 collaboration with Gwyneth Sprout, FNP regarding development and update of  comprehensive plan of care as evidenced by provider attestation and co-signature Inter-disciplinary care team collaboration (see longitudinal plan of care) Comprehensive medication review performed; medication list updated in electronic medical record  Hypertension (BP goal <140/90) -Controlled -History of heart murmur -Current treatment: Diltiazem SR 120 mg AM, 240 mg PM  Losartan-HCTZ 100-25 mg daily  Metoprolol XL 200 mg daily  -Medications previously tried: NA  -Current home readings: Home BP in the 110-120s typically, pulse typically 60s.  -Denies hypotensive/hypertensive symptoms -Patient with no clear indication for diltiazem or metoprolol, questionably appropriate.  -DECREASE Diltiazem to 120 mg twice daily   Hyperlipidemia: (LDL goal < 100) -Controlled -Current treatment: Lovastatin 20 mg nightly  -Medications previously tried: NA  -Recommended to continue current medication  Diabetes (A1c goal <7%) -Controlled -Current medications: Trulicity 1.5 mg weekly  -Medications previously tried: NA  -Current home glucose readings fasting glucose: Not routinely monitoring  -Denies hypoglycemic/hyperglycemic symptoms -Working on weight loss via [employer]. Lost 36 pounds since starting.  -Recommended to continue current medication  Hypothyroidism (Goal: Maintain stable thyroid function) -Controlled -Current treatment  Levothyroxine 88 mcg daily  -Medications previously tried: NA  -Recommended to continue current medication  Patient Goals/Self-Care Activities Patient will:  - check blood pressure weekly, document, and provide at future appointments  Follow Up Plan: Telephone follow up appointment with care management team member scheduled for:  09/14/2021 at 3:45 PM      Medication Assistance:  Trulicity obtained through [employer] medication assistance program.  Enrollment ends Dec 2022  Compliance/Adherence/Medication fill history: Care  Gaps: Influenza  Star-Rating Drugs: Losartan: Last filled 06/30/21 for 90-DS  [id number] [id number]: [phone]: [phone]  Uses pill box? Yes Pt endorses 100% compliance  We discussed: Current pharmacy is preferred with insurance plan and patient is satisfied with pharmacy services Patient decided to: Continue current medication management strategy  Care Plan and Follow Up Patient Decision:  Patient agrees to Care Plan and Follow-up.  Plan: Telephone follow up appointment with care management team member scheduled for:  09/14/2021 at 3:45 PM  [name], PharmD, [place], [place] [phone]

## 2021-08-12 MED ORDER — DILTIAZEM HCL ER 120 MG PO CP12: 120.0000 mg | ORAL_CAPSULE | Freq: Two times a day (BID) | ORAL | Status: DC

## 2021-08-12 NOTE — Patient Instructions (Signed)
Visit Information It was great speaking with you today!  Please let me know if you have any questions about our visit.   Goals Addressed             This Visit's Progress    Track and Manage My Blood Pressure-Hypertension       Timeframe:  Long-Range Goal Priority:  High Start Date: 08/10/2021                             Expected End Date:  08/10/2022                     Follow Up Date 09/14/2021    - check blood pressure 3 times per week - write blood pressure results in a log or diary    Why is this important?   You won't feel high blood pressure, but it can still hurt your blood vessels.  High blood pressure can cause heart or kidney problems. It can also cause a stroke.  Making lifestyle changes like losing a little weight or eating less salt will help.  Checking your blood pressure at home and at different times of the day can help to control blood pressure.  If the doctor prescribes medicine remember to take it the way the doctor ordered.  Call the office if you cannot afford the medicine or if there are questions about it.     Notes:         Patient Care Plan: General Pharmacy (Adult)     Problem Identified: Hypertension, Hyperlipidemia, Diabetes, and Hypothyroidism   Priority: High     Long-Range Goal: Patient-Specific Goal   Start Date: 08/12/2021  Expected End Date: 08/12/2022  This Visit's Progress: On track  Priority: High  Note:   Current Barriers:  Unable to independently afford treatment regimen  Pharmacist Clinical Goal(s):  Patient will verbalize ability to afford treatment regimen maintain control of blood pressure as evidenced by BP less than 140/90  through collaboration with PharmD and provider.   Interventions: 1:1 collaboration with Gwyneth Sprout, FNP regarding development and update of comprehensive plan of care as evidenced by provider attestation and co-signature Inter-disciplinary care team collaboration (see longitudinal plan of  care) Comprehensive medication review performed; medication list updated in electronic medical record  Hypertension (BP goal <140/90) -Controlled -History of heart murmur -Current treatment: Diltiazem SR 120 mg AM, 240 mg PM  Losartan-HCTZ 100-25 mg daily  Metoprolol XL 200 mg daily  -Medications previously tried: NA  -Current home readings: Home BP in the 110-120s typically, pulse typically 60s.  -Denies hypotensive/hypertensive symptoms -Patient with no clear indication for diltiazem or metoprolol, questionably appropriate.  -DECREASE Diltiazem to 120 mg twice daily   Hyperlipidemia: (LDL goal < 100) -Controlled -Current treatment: Lovastatin 20 mg nightly  -Medications previously tried: NA  -Recommended to continue current medication  Diabetes (A1c goal <7%) -Controlled -Current medications: Trulicity 1.5 mg weekly  -Medications previously tried: NA  -Current home glucose readings fasting glucose: Not routinely monitoring  -Denies hypoglycemic/hyperglycemic symptoms -Working on weight loss via [employer]. Lost 36 pounds since starting.  -Recommended to continue current medication  Hypothyroidism (Goal: Maintain stable thyroid function) -Controlled -Current treatment  Levothyroxine 88 mcg daily  -Medications previously tried: NA  -Recommended to continue current medication  Patient Goals/Self-Care Activities Patient will:  - check blood pressure weekly, document, and provide at future appointments  Follow Up Plan: Telephone follow up  appointment with care management team member scheduled for:  09/14/2021 at 3:45 PM      Patient agreed to services and verbal consent obtained.   Patient verbalizes understanding of instructions provided today and agrees to view in [place].   [name], PharmD, [place], [place] [phone]

## 2021-09-13 ENCOUNTER — Telehealth: Payer: Self-pay

## 2021-09-13 NOTE — Progress Notes (Signed)
APPOINTMENT REMINDER  MIKYLAH ACKROYD was reminded to have all medications, supplements and any blood glucose and blood pressure readings available for review with [name], Pharm. D, at her telephone visit on 09/14/2021 at 3:45 pm .   Questions: Have you had any recent office visit or specialist visit outside of [place]? No Are there any concerns you would like to discuss during your office visit? NO Are you having any problems obtaining your medications? (Whether it pharmacy issues or cost) NO If patient has any PAP medications ask if they are having any problems getting their PAP medication or refill? [place] Pharmacist Assistant [phone]

## 2021-09-14 ENCOUNTER — Ambulatory Visit (INDEPENDENT_AMBULATORY_CARE_PROVIDER_SITE_OTHER): Payer: PPO

## 2021-09-14 DIAGNOSIS — E1159 Type 2 diabetes mellitus with other circulatory complications: Secondary | ICD-10-CM

## 2021-09-14 DIAGNOSIS — E1129 Type 2 diabetes mellitus with other diabetic kidney complication: Secondary | ICD-10-CM

## 2021-09-14 DIAGNOSIS — I152 Hypertension secondary to endocrine disorders: Secondary | ICD-10-CM

## 2021-09-14 DIAGNOSIS — J302 Other seasonal allergic rhinitis: Secondary | ICD-10-CM

## 2021-09-14 NOTE — Progress Notes (Signed)
Chronic Care Management Pharmacy Note  09/14/2021 Name:  Heather Burke MRN:  [id number] DOB:  1951-12-02  Summary: Patient presents for CCM follow-up. 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 Her allergies have been much worse recently.   Recommendations/Changes made from today's visit: -Recommend STARTING Xyzal 5 mg nightly   Plan: CPP Follow-up 3 months  Subjective: [name] is an 70 y.o. year old female who is a primary patient of Gwyneth Sprout, FNP.  The CCM team was consulted for assistance with disease management and care coordination needs.    Engaged with patient by telephone for follow up visit in response to provider referral for pharmacy case management and/or care coordination services.   Consent to Services:  The patient was given information about Chronic Care Management services, agreed to services, and gave verbal consent prior to initiation of services.  Please see initial visit note for detailed documentation.   Patient Care Team: Gwyneth Sprout, FNP as PCP - General (Family Medicine) Lorelee Cover., MD as Consulting Physician (Ophthalmology)  Recent office visits: 06/15/21: Patient presented to Dr. Brita Romp for follow-up. A1c 6.1%.   Recent consult visits: None in previous 6 months  Hospital visits: None in previous 6 months   Objective:  Lab Results  Component Value Date   CREATININE 0.68 06/17/2021   BUN 18 06/17/2021   GFRNONAA 60 12/04/2019   GFRAA 69 12/04/2019   NA 138 06/17/2021   K 4.1 06/17/2021   CALCIUM 10.0 06/17/2021   CO2 26 06/17/2021   GLUCOSE 93 06/17/2021    Lab Results  Component Value Date/Time   HGBA1C 6.1 (A) 06/15/2021 11:18 AM   HGBA1C 6.5 (A) 03/15/2021 10:35 AM   HGBA1C 6.9 (H) 12/04/2019 10:11 AM   HGBA1C 6.8 (H) 09/18/2018 09:12 AM    MICROALBUR Negative 09/29/2015 11:26 AM    Last diabetic Eye exam:  Lab Results  Component Value Date/Time   HMDIABEYEEXA No Retinopathy 01/12/2021 12:00 AM    Last diabetic Foot exam: No results found for: HMDIABFOOTEX   Lab Results  Component Value Date   CHOL 139 06/17/2021   HDL 31 (L) 06/17/2021   LDLCALC 75 06/17/2021   TRIG 192 (H) 06/17/2021   CHOLHDL 4.5 (H) 06/17/2021    Hepatic Function Latest Ref Rng & Units 06/17/2021 12/04/2019 09/18/2018  Total Protein 6.0 - 8.5 g/dL 7.5 7.6 7.4  Albumin 3.8 - 4.8 g/dL 4.1 4.3 4.4  AST 0 - 40 IU/L 26 55(H) 51(H)  ALT 0 - 32 IU/L 34(H) 43(H) 58(H)  Alk Phosphatase 44 - 121 IU/L 76 64 54  Total Bilirubin 0.0 - 1.2 mg/dL 0.4 0.5 0.2    Lab Results  Component Value Date/Time   TSH 2.060 06/17/2021 01:38 PM   TSH 2.290 12/04/2019 10:11 AM    CBC Latest Ref Rng & Units 12/04/2019 09/18/2018 01/16/2018  WBC 3.4 - 10.8 x10E3/uL 7.0 6.6 7.4  Hemoglobin 11.1 - 15.9 g/dL 15.1 15.2 14.6  Hematocrit 34.0 - 46.6 % 43.8 43.5 41.9  Platelets 150 - 450 x10E3/uL 237 241 217    Lab Results  Component Value Date/Time   VD25OH 48.2 09/18/2018 09:12 AM   VD25OH 52.7 04/20/2016 08:04 AM    Clinical ASCVD: No  The 10-year ASCVD risk score (Arnett DK, et al., 2019) is: 26.6%  Values used to calculate the score:     Age: 70 years     Sex: Female     Is Non-Hispanic African American: No     Diabetic: Yes     Tobacco smoker: Yes     Systolic Blood Pressure: 970 mmHg     Is BP treated: Yes     HDL Cholesterol: 31 mg/dL     Total Cholesterol: 139 mg/dL    Depression screen [facility] 2/9 03/15/2021 12/14/2020 06/04/2020  Decreased Interest 0 0 0  Down, Depressed, Hopeless 0 0 0  PHQ - 2 Score 0 0 0  Altered sleeping 0 - -  Tired, decreased energy 0 - -  Change in appetite 0 - -  Feeling bad or failure about yourself  0 - -  Trouble concentrating 0 - -  Moving slowly or fidgety/restless 0 - -  Suicidal thoughts 0 - -  PHQ-9 Score 0 - -   Difficult doing work/chores Not difficult at all - -     Social History   Tobacco Use  Smoking Status Every Day   Packs/day: 2.00   Years: 49.00   Pack years: 98.00   Types: Cigarettes  Smokeless Tobacco Never   BP Readings from Last 3 Encounters:  06/15/21 (!) 113/56  03/15/21 (!) 165/82  12/28/20 (!) 180/96   Pulse Readings from Last 3 Encounters:  06/15/21 80  03/15/21 62  12/28/20 67   Wt Readings from Last 3 Encounters:  06/15/21 161 lb (73 kg)  03/15/21 173 lb (78.5 kg)  12/28/20 184 lb 4.8 oz (83.6 kg)   BMI Readings from Last 3 Encounters:  06/15/21 29.45 kg/m  03/15/21 30.65 kg/m  12/28/20 32.65 kg/m    Assessment/Interventions: Review of patient past medical history, allergies, medications, health status, including review of consultants reports, laboratory and other test data, was performed as part of comprehensive evaluation and provision of chronic care management services.   SDOH:  (Social Determinants of Health) assessments and interventions performed: Yes   SDOH Screenings   Alcohol Screen: Low Risk    Last Alcohol Screening Score (AUDIT): 0  Depression (PHQ2-9): Low Risk    PHQ-2 Score: 0  Financial Resource Strain: Low Risk    Difficulty of Paying Living Expenses: Not hard at all  Food Insecurity: No Food Insecurity   Worried About Charity fundraiser in the Last Year: Never true   Ran Out of Food in the Last Year: Never true  Housing: Low Risk    Last Housing Risk Score: 0  Physical Activity: Inactive   Days of Exercise per Week: 0 days   Minutes of Exercise per Session: 0 min  Social Connections: Socially Isolated   Frequency of Communication with Friends and Family: More than three times a week   Frequency of Social Gatherings with Friends and Family: More than three times a week   Attends Religious Services: Never   Marine scientist or Organizations: No   Attends Archivist Meetings: Never   Marital Status: Widowed   Stress: No Stress Concern Present   Feeling of Stress : Only a little  Tobacco Use: High Risk   Smoking Tobacco Use: Every Day   Smokeless Tobacco Use: Never  Transportation Needs: No Transportation Needs   Lack of Transportation (Medical): No   Lack of Transportation (Non-Medical): No    CCM Care Plan  Allergies  Allergen Reactions   Amlodipine Nausea Only and Other (See Comments)  Stomach cramping   Atorvastatin Nausea And Vomiting   Lisinopril Cough   Pravastatin Nausea And Vomiting   Nifedipine Er Other (See Comments)    headache    Medications Reviewed Today     Reviewed by Virginia Crews, MD (Physician) on 06/15/21 at 1132  Med List Status: <None>   Medication Order Taking? Sig Documenting Provider Last Dose Status Informant  aspirin 81 MG tablet [id number] Yes Take 81 mg by mouth daily. [provider] Taking Active   Bisacodyl (GENTLE LAXATIVE PO) [id number] Yes Take by mouth. [provider] Taking Active   Blood Glucose Monitoring Suppl [facility] VERIO) w/Device KIT [id number] Yes To check blood sugar daily [name], [place] Taking Active   calcium carbonate (OS-CAL) 600 MG TABS tablet [id number] Yes Take 1 tablet (600 mg total) by mouth 2 (two) times daily with a meal. Forest Gleason, MD Taking Active   CANNABIDIOL PO [id number] No Take 500 mg by mouth daily.  Patient not taking: Reported on 06/15/2021   [provider] Not Taking Active   Cholecalciferol (D3 ADULT PO) [id number] Yes Take 1 tablet by mouth daily. 1000 units daily [provider] Taking Active   Collagen-Vitamin C-Biotin (COLLAGEN 1500/C PO) [id number] Yes Take by mouth. [provider] Taking Active   diltiazem (CARDIZEM SR) 120 MG 12 hr capsule [id number] Yes TAKE 1 CAPSULE (120MG) IN THE MORNING AND 2 CAPSULES (240MG) IN THE EVENING Bacigalupo, Dionne Bucy, MD Taking Active   Dulaglutide (TRULICITY) 1.5 EX/5.1ZG SOPN [id number] Yes Inject 0.5 mLs  (1.5 mg total) into the skin once a week. [place]ye [place] Taking Active   FIBER PO [id number] Yes Take by mouth 2 (two) times daily. [provider] Taking Active   fluticasone (FLONASE) 50 MCG/ACT nasal spray [id number]  SPRAY 2 [place] NOSTRIL EVERY DAY Bacigalupo, Dionne Bucy, MD  Active   glucose blood [facility] VERIO) test strip [id number] Yes To check blood sugar daily [name], PA-C Taking Active   Lancets [facility] ULTRASOFT) lancets [id number] Yes To check blood sugar daily [name], PA-C Taking Active   levothyroxine (SYNTHROID) 88 MCG tablet [id number]  Take 1 tablet (88 mcg total) by mouth daily. Virginia Crews, MD  Active   losartan-hydrochlorothiazide [facility]) 100-25 MG tablet [id number]  Take 1 tablet by mouth daily. Virginia Crews, MD  Active   lovastatin (MEVACOR) 20 MG tablet [id number]  Take 1 tablet (20 mg total) by mouth at bedtime. Virginia Crews, MD  Active   metoprolol (TOPROL-XL) 200 MG 24 hr tablet [id number]  Take 1 tablet (200 mg total) by mouth daily. Virginia Crews, MD  Active   Misc Natural Products (T-RELIEF CBD+13 SL) [id number] Yes Place 500 mg under the tongue daily. [provider] Taking Active Self  Nutritional Supplements (QUINOA/KALE/HEMP PO) [id number] Yes Place 750 mg under the tongue. [provider] Taking Active   Omega-3 Fatty Acids (FISH OIL) 1200 MG CAPS [id number] Yes Take by mouth daily. 2400 qam 1200 mg qpm [provider] Taking Active   potassium chloride (KLOR-CON M10) 10 MEQ tablet [id number]  Take 1 tablet (10 mEq total) by mouth daily. Virginia Crews, MD  Active             Patient Active Problem List   Diagnosis Date Noted   Hyperlipidemia associated with type 2 diabetes mellitus ([place]) 06/15/2021   Overweight 06/15/2021   Statin intolerance 12/29/2020   Benign neoplasm of sigmoid colon  Mass of right breast 02/17/2016   History of  parotitis 01/05/2016   Hypothyroidism 09/29/2015   Hyperlipemia 09/29/2015   Newly recognized murmur 09/29/2015   Allergic rhinitis 08/27/2015   Anxiety 08/27/2015   Type 2 diabetes mellitus with other diabetic kidney complication ([place]) 12/90/4753   Avitaminosis D 08/27/2015   Compulsive tobacco user syndrome 08/27/2015   Hypertension associated with diabetes ([place]) 07/01/2015    Immunization History  Administered Date(s) Administered   Fluad Quad(high Dose 65+) 10/15/2020   Influenza Split 10/21/2010, 10/13/2012   Influenza, High Dose Seasonal PF 10/04/2018   Influenza,inj,Quad PF,6+ Mos 10/02/2014, 09/09/2015   Influenza-Unspecified 09/21/2016, 10/03/2017, 09/20/2019   PFIZER Comirnaty(Gray Top)Covid-19 Tri-Sucrose Vaccine 07/13/2021   PFIZER(Purple Top)SARS-COV-2 Vaccination 02/18/2020, 02/23/2020, 09/10/2020   Pneumococcal Conjugate-13 07/21/2017   Pneumococcal Polysaccharide-23 01/22/2013, 08/23/2018   Tdap 10/21/2010   Zoster Recombinat (Shingrix) 04/27/2020, 01/27/2021   Zoster, Live 10/03/2013    Conditions to be addressed/monitored:  Hypertension, Hyperlipidemia, Diabetes, and Hypothyroidism  There are no care plans that you recently modified to display for this patient.   Medication Assistance:  Trulicity obtained through [employer] medication assistance program.  Enrollment ends Dec 2022  Compliance/Adherence/Medication fill history: Care Gaps: Influenza  Star-Rating Drugs: Losartan: Last filled 06/30/21 for 90-DS  Patient[facility] pharmacy [facility]  [id number] [place] [place]Phone: [phone]: [phone] Uses pill box? Yes Pt endorses 100% compliance  We discussed: Current pharmacy is preferred with insurance plan and patient is satisfied with pharmacy services Patient decided to: Continue current medication management strategy  Care Plan and Follow Up Patient Decision:   Patient agrees to Care Plan and Follow-up.  Plan: Telephone follow up appointment with care management team member scheduled for:  09/14/2021 at 3:45 PM  AJunius Argyle PharmD, [phone]

## 2021-09-15 MED ORDER — LEVOCETIRIZINE DIHYDROCHLORIDE 5 MG PO TABS: 5.0000 mg | ORAL_TABLET | Freq: Every evening | ORAL | Status: DC

## 2021-09-15 NOTE — Patient Instructions (Signed)
Visit Information It was great speaking with you today!  Please let me know if you have any questions about our visit.   Goals Addressed             This Visit's Progress    Track and Manage My Blood Pressure-Hypertension   On track    Timeframe:  Long-Range Goal Priority:  High Start Date: 08/10/2021                             Expected End Date:  08/10/2022                     Follow Up within 30 days   - check blood pressure 3 times per week - write blood pressure results in a log or diary    Why is this important?   You won't feel high blood pressure, but it can still hurt your blood vessels.  High blood pressure can cause heart or kidney problems. It can also cause a stroke.  Making lifestyle changes like losing a little weight or eating less salt will help.  Checking your blood pressure at home and at different times of the day can help to control blood pressure.  If the doctor prescribes medicine remember to take it the way the doctor ordered.  Call the office if you cannot afford the medicine or if there are questions about it.     Notes:         Patient Care Plan: General Pharmacy (Adult)     Problem Identified: Hypertension, Hyperlipidemia, Diabetes, and Hypothyroidism   Priority: High     Long-Range Goal: Patient-Specific Goal   Start Date: 08/12/2021  Expected End Date: 08/12/2022  This Visit's Progress: On track  Recent Progress: On track  Priority: High  Note:   Current Barriers:  Unable to independently afford treatment regimen  Pharmacist Clinical Goal(s):  Patient will verbalize ability to afford treatment regimen maintain control of blood pressure as evidenced by BP less than 140/90  through collaboration with PharmD and provider.   Interventions: 1:1 collaboration with Gwyneth Sprout, FNP regarding development and update of comprehensive plan of care as evidenced by provider attestation and co-signature Inter-disciplinary care team  collaboration (see longitudinal plan of care) Comprehensive medication review performed; medication list updated in electronic medical record  Hypertension (BP goal <140/90) -Controlled -History of heart murmur -Current treatment: Diltiazem SR 120 mg AM, 240 mg PM  Losartan-HCTZ 100-25 mg daily  Metoprolol XL 200 mg daily  -Medications previously tried: Olmesartan-Amlodipine-HCTZ   -Current home readings:  143/68, pulse 66  141/73, pulse 67  133/69, pulse 62  134/68 pulse 67  141/65, pulse 69  -Denies hypotensive/hypertensive symptoms -Patient with no clear indication for diltiazem or metoprolol other than distal history of heart murmur, questionably appropriate. Patient has had intolerances to other BP medications and is hesitant about switching. 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  -Continue current medications  Hyperlipidemia: (LDL goal < 100) -Controlled -Current treatment: Lovastatin 20 mg nightly  -Medications previously tried: NA  -Recommended to continue current medication  Diabetes (A1c goal <7%) -Controlled -Current medications: Trulicity 1.5 mg weekly  -Medications previously tried: NA  -Current home glucose readings fasting glucose: Not routinely monitoring  -Denies hypoglycemic/hyperglycemic symptoms -Working  on weight loss via [employer] program. Lost 36 pounds since starting.  -Patient unsure if she should be taking Trulicity 1.27 mg or Trulicity 1.5 mg weekly.  -Recommended to continue current medication  Hypothyroidism (Goal: Maintain stable thyroid function) -Controlled -Current treatment  Levothyroxine 88 mcg daily  -Medications previously tried: NA  -Recommended to continue current medication  Allergic Rhinitis (Goal: Minimize symptoms) -Controlled -Current treatment  Flonase 50  mcg/act 2 sprays nightly  -Medications previously tried: NA -Symptoms much worse recently due to fall weather.  -Recommend STARTING Xyzal 5 mg nightly   Patient Goals/Self-Care Activities Patient will:  - check blood pressure weekly, document, and provide at future appointments  Follow Up Plan: Telephone follow up appointment with care management team member scheduled for:  09/14/2021 at 3:45 PM      Patient agreed to services and verbal consent obtained.   Patient verbalizes understanding of instructions provided today and agrees to view in [place].   [name], PharmD, Para [place], [place] [phone]

## 2021-09-17 DIAGNOSIS — E1129 Type 2 diabetes mellitus with other diabetic kidney complication: Secondary | ICD-10-CM

## 2021-09-17 DIAGNOSIS — E1159 Type 2 diabetes mellitus with other circulatory complications: Secondary | ICD-10-CM

## 2021-09-17 DIAGNOSIS — I152 Hypertension secondary to endocrine disorders: Secondary | ICD-10-CM | POA: Diagnosis not present

## 2021-09-30 ENCOUNTER — Telehealth: Payer: Self-pay

## 2021-09-30 NOTE — Progress Notes (Signed)
Chronic Care Management Pharmacy Assistant   Name: Heather Burke  MRN: [id number] DOB: 07/29/1951  PAP Re-enrollment for Trulicity with [facility]  Reason for Encounter: Hypertension Disease State Call  Patient is currently on Patient Assistance with [employer] for her Trulicity. This foundation does require a new application be sent in before 12/18/2021 in order for patient to be eligible for the 2023 year. I will start the application process for this patient in order to make sure there will be no delay in patient's care.  I spoke with the patient and informed her that I was in the process of completing her application for re-enrollment for her Trulicity, and wanted to know how she would like to receive the application. Patient stated that [name], CPP can just mail the application to her home address on file. I informed her that Cristie Hem is currently out of the office, and he would be back next week and would mail it one day next week if that was okay. Patient stated that was fine. Patient instructed that once she completes the application she should return it to the providers office so Cristie Hem can fax it to [employer] @[phone] for processing. Patient encouraged to give me a call if she has any questions. Patient stated she has plenty of medication at this time, and according to what I have on file she should have 3 refills left.   E-mail sent to [name], CPP with the application attached so he can print and mail to patient.   Recent office visits:  None ID  Recent consult visits:  None ID  Hospital visits:  None in previous 6 months  Medications: Outpatient Encounter Medications as of 09/30/2021  Medication Sig   aspirin 81 MG tablet Take 81 mg by mouth daily.   Bisacodyl (GENTLE LAXATIVE PO) Take by mouth.   Blood Glucose Monitoring Suppl (ONETOUCH VERIO) w/Device KIT To check blood sugar daily   calcium carbonate (OS-CAL) 600 MG TABS tablet  Take 1 tablet (600 mg total) by mouth 2 (two) times daily with a meal.   CANNABIDIOL PO Take 500 mg by mouth daily. (Patient not taking: Reported on 06/15/2021)   Cholecalciferol (D3 ADULT PO) Take 1 tablet by mouth daily. 1000 units daily   Collagen-Vitamin C-Biotin (COLLAGEN 1500/C PO) Take by mouth.   diltiazem (CARDIZEM SR) 120 MG 12 hr capsule Take 1 capsule (120 mg total) by mouth 2 (two) times daily.   Dulaglutide (TRULICITY) 1.5 PJ/0.3PR SOPN Inject 0.5 mLs (1.5 mg total) into the skin once a week.   FIBER PO Take by mouth 2 (two) times daily.   fluticasone (FLONASE) 50 MCG/ACT nasal spray SPRAY 2 SPRAYS INTO EACH NOSTRIL EVERY DAY   glucose blood (ONETOUCH VERIO) test strip To check blood sugar daily   Lancets (ONETOUCH ULTRASOFT) lancets To check blood sugar daily   levocetirizine (XYZAL ALLERGY 24HR) 5 MG tablet Take 1 tablet (5 mg total) by mouth every evening.   levothyroxine (SYNTHROID) 88 MCG tablet Take 1 tablet (88 mcg total) by mouth daily.   losartan-hydrochlorothiazide (HYZAAR) 100-25 MG tablet Take 1 tablet by mouth daily.   lovastatin (MEVACOR) 20 MG tablet Take 1 tablet (20 mg total) by mouth at bedtime.   metoprolol (TOPROL-XL) 200 MG 24 hr tablet Take 1 tablet (200 mg total) by mouth daily.   Misc Natural Products (T-RELIEF CBD+13 SL) Place 500 mg under the tongue daily.   Nutritional Supplements (QUINOA/KALE/HEMP PO) Place 750  mg under the tongue.   Omega-3 Fatty Acids (FISH OIL) 1200 MG CAPS Take by mouth daily. 2400 qam 1200 mg qpm   potassium chloride (KLOR-CON M10) 10 MEQ tablet Take 1 tablet (10 mEq total) by mouth daily.   No facility-administered encounter medications on file as of 09/30/2021.   Care Gaps: Influenza Vaccine (patient reports that she had her vaccine done 09/29/2021)  Star Rating Drugs: Lovastatin 20 mg last filled on 09/02/2021 [facility]Day [facility] Losartan-HCTZ 100-25 mg last filled on 09/06/2021 [facility] Trulicity 1.5 mg (patient currently receives patient assistance for this medication from [facility])  Reviewed chart prior to disease state call. Spoke with patient regarding BP  Recent Office Vitals: BP Readings from Last 3 Encounters:  06/15/21 (!) 113/56  03/15/21 (!) 165/82  12/28/20 (!) 180/96   Pulse Readings from Last 3 Encounters:  06/15/21 80  03/15/21 62  12/28/20 67    Wt Readings from Last 3 Encounters:  06/15/21 161 lb (73 kg)  03/15/21 173 lb (78.5 kg)  12/28/20 184 lb 4.8 oz (83.6 kg)     Kidney Function Lab Results  Component Value Date/Time   CREATININE 0.68 06/17/2021 01:38 PM   CREATININE 0.98 12/04/2019 10:11 AM   GFRNONAA 60 12/04/2019 10:11 AM   GFRAA 69 12/04/2019 10:11 AM    BMP Latest Ref Rng & Units 06/17/2021 12/04/2019 09/18/2018  Glucose 65 - 99 mg/dL 93 150(H) 114(H)  BUN 8 - 27 mg/dL 18 12 10   Creatinine 0.57 - 1.00 mg/dL 0.68 0.98 0.69  BUN/Creat Ratio 12 - 28 26 12 14   Sodium 134 - 144 mmol/L 138 138 138  Potassium 3.5 - 5.2 mmol/L 4.1 4.0 4.6  Chloride 96 - 106 mmol/L 96 96 95(L)  CO2 20 - 29 mmol/L 26 22 27   Calcium 8.7 - 10.3 mg/dL 10.0 10.2 9.7    Current antihypertensive regimen:  Diltiazem 120 mg 1 tablet twice daily Metoprolol 200 mg 1 tablet daily Losartan-HCTZ 100-25 mg 1 tablet daily  How often are you checking your Blood Pressure? 1-2x per week  Current home BP readings: Patient reports she hasn't taken her blood pressure this week, but she denies any hypertensive symptoms. Patient stated she has been filling good. She stated the only thing wrong at this time is she had her flu vaccine on yesterday and her arm is a little sore.   What recent interventions/DTPs have been made by any provider to improve Blood Pressure control since last CPP Visit: None ID  Any recent hospitalizations or ED visits since last visit with CPP? No  What diet changes have been made to improve Blood Pressure Control?   Patient hasn't made any diet changes she still eats the same  What exercise is being done to improve your Blood Pressure Control?  Patient reports she doesn't do a routine exercise, but she does stay busy.  Adherence Review: Is the patient currently on ACE/ARB medication? Yes Does the patient have >5 day gap between last estimated fill dates? No   Patient has a scheduled appointment with CPP on 11/23/2021 @1030    Lynann Bologna, [place] Clinical Pharmacist Assistant Phone: [phone]

## 2021-11-01 ENCOUNTER — Telehealth: Payer: Self-pay

## 2021-11-01 NOTE — Progress Notes (Signed)
Chronic Care Management Pharmacy Assistant   Name: Heather Burke  MRN: [id number] DOB: 03-19-1951  Reason for Encounter: Hypertension Disease State Call/PAP Trulicity follow-up    Recent office visits:  None ID  Recent consult visits:  None ID  Hospital visits:  None in previous 6 months  Medications: Outpatient Encounter Medications as of 11/01/2021  Medication Sig   aspirin 81 MG tablet Take 81 mg by mouth daily.   Bisacodyl (GENTLE LAXATIVE PO) Take by mouth.   Blood Glucose Monitoring Suppl (ONETOUCH VERIO) w/Device KIT To check blood sugar daily   calcium carbonate (OS-CAL) 600 MG TABS tablet Take 1 tablet (600 mg total) by mouth 2 (two) times daily with a meal.   CANNABIDIOL PO Take 500 mg by mouth daily. (Patient not taking: Reported on 06/15/2021)   Cholecalciferol (D3 ADULT PO) Take 1 tablet by mouth daily. 1000 units daily   Collagen-Vitamin C-Biotin (COLLAGEN 1500/C PO) Take by mouth.   diltiazem (CARDIZEM SR) 120 MG 12 hr capsule Take 1 capsule (120 mg total) by mouth 2 (two) times daily.   Dulaglutide (TRULICITY) 1.5 RF/7.5OI SOPN Inject 0.5 mLs (1.5 mg total) into the skin once a week.   FIBER PO Take by mouth 2 (two) times daily.   fluticasone (FLONASE) 50 MCG/ACT nasal spray SPRAY 2 SPRAYS INTO EACH NOSTRIL EVERY DAY   glucose blood (ONETOUCH VERIO) test strip To check blood sugar daily   Lancets (ONETOUCH ULTRASOFT) lancets To check blood sugar daily   levocetirizine (XYZAL ALLERGY 24HR) 5 MG tablet Take 1 tablet (5 mg total) by mouth every evening.   levothyroxine (SYNTHROID) 88 MCG tablet Take 1 tablet (88 mcg total) by mouth daily.   losartan-hydrochlorothiazide (HYZAAR) 100-25 MG tablet Take 1 tablet by mouth daily.   lovastatin (MEVACOR) 20 MG tablet Take 1 tablet (20 mg total) by mouth at bedtime.   metoprolol (TOPROL-XL) 200 MG 24 hr tablet Take 1 tablet (200 mg total) by mouth daily.   Misc Natural Products (T-RELIEF CBD+13 SL) Place 500 mg under  the tongue daily.   Nutritional Supplements (QUINOA/KALE/HEMP PO) Place 750 mg under the tongue.   Omega-3 Fatty Acids (FISH OIL) 1200 MG CAPS Take by mouth daily. 2400 qam 1200 mg qpm   potassium chloride (KLOR-CON M10) 10 MEQ tablet Take 1 tablet (10 mEq total) by mouth daily.   No facility-administered encounter medications on file as of 11/01/2021.   Care Gaps: None ID  Star Rating Drugs: Lovastatin 20 mg last filled on 09/02/2021 for a 90-Day supply with [facility] Losartan-HCTZ 100-25 mg last filled on 09/06/2021 for a 90-Day supply with [facility] Trulicity 1.5 mg (patient currently receives patient assistance for this medication from [facility])  Reviewed chart prior to disease state call. Spoke with patient regarding BP  Recent Office Vitals: BP Readings from Last 3 Encounters:  06/15/21 (!) 113/56  03/15/21 (!) 165/82  12/28/20 (!) 180/96   Pulse Readings from Last 3 Encounters:  06/15/21 80  03/15/21 62  12/28/20 67    Wt Readings from Last 3 Encounters:  06/15/21 161 lb (73 kg)  03/15/21 173 lb (78.5 kg)  12/28/20 184 lb 4.8 oz (83.6 kg)     Kidney Function Lab Results  Component Value Date/Time   CREATININE 0.68 06/17/2021 01:38 PM   CREATININE 0.98 12/04/2019 10:11 AM   GFRNONAA 60 12/04/2019 10:11 AM   GFRAA 69 12/04/2019 10:11 AM    BMP Latest Ref Rng & Units 06/17/2021  12/04/2019 09/18/2018  Glucose 65 - 99 mg/dL 93 150(H) 114(H)  BUN 8 - 27 mg/dL _0 Creatinine 0.57 - 1.00 mg/dL 0.68 0.98 0.69  BUN/Creat Ratio 12 - _1 Sodium 134 - 144 mmol/L 138 138 138  Potassium 3.5 - 5.2 mmol/L 4.1 4.0 4.6  Chloride 96 - 106 mmol/L 96 96 95(L)  CO2 20 - 29 mmol/L _2 Calcium 8.7 - 10.3 mg/dL 10.0 10.2 9.7    Current antihypertensive regimen:  Diltiazem 120 mg 1 tablet twice daily Metoprolol 200 mg 1 tablet daily Losartan-HCTZ 100-25 mg 1 tablet daily  Patient reports that she is taking all medications as directed, and  denies experiencing any side effects. Patient does not need any refills at this time.   How often are you checking your Blood Pressure? 1-2x per week  Current home BP readings: Patient reports that she has no readings this week as she has been on vacation all week. Her, and her family just returned from [place] so she did not check her blood pressure at all during the time she was out of town, but denies having any ill symptoms or any symptoms of hypotension.   What recent interventions/DTPs have been made by any provider to improve Blood Pressure control since last CPP Visit: None ID  Any recent hospitalizations or ED visits since last visit with CPP? No  What diet changes have been made to improve Blood Pressure Control?  Patient is not on any type of diet she eats a regular diet  What exercise is being done to improve your Blood Pressure Control?  Patient does not do any routine exercise, but she reports being at [facility] this week she did do a lot more movement than normal.   Adherence Review: Is the patient currently on ACE/ARB medication? Yes Does the patient have >5 day gap between last estimated fill dates? No  I spoke to the patient about her Trulicity PAP, and she reports she still has not turned it in. I reminded the patient that it does have to be turned in to [employer] by 12/18/2021. Patient reports that she will complete it this week, and bring it to the clinic for Alex to fax over to [facility].    Patient has a scheduled telephone appointment with CPP on 11/23/2021 _3   Lynann Bologna, [place] Clinical Pharmacist Assistant Phone: [phone]

## 2021-11-16 ENCOUNTER — Ambulatory Visit (INDEPENDENT_AMBULATORY_CARE_PROVIDER_SITE_OTHER): Payer: PPO

## 2021-11-16 DIAGNOSIS — E1129 Type 2 diabetes mellitus with other diabetic kidney complication: Secondary | ICD-10-CM

## 2021-11-16 NOTE — Progress Notes (Signed)
[employer] Patient Assistance form for Trulicity completed by patient and faxed for review on 11/16/21  [name], PharmD, Para [place], [place] Pharmacist Practitioner  [facility] [phone]

## 2021-11-17 DIAGNOSIS — E1129 Type 2 diabetes mellitus with other diabetic kidney complication: Secondary | ICD-10-CM

## 2021-11-22 ENCOUNTER — Telehealth: Payer: Self-pay

## 2021-11-22 NOTE — Progress Notes (Signed)
Chronic Care Management Pharmacy Note  11/23/2021 Name:  Heather Burke MRN:  [id number] DOB:  04/06/1951  Summary: Patient presents for CCM follow-up. Some elevated blood pressures, but patient home readings are borderline at goal. Will continue to monitor  Recommendations/Changes made from today's visit: -Continue current medications  Plan: CPP Follow-up 3 months  Subjective: Heather Burke is an 70 y.o. year old female who is a primary patient of Gwyneth Sprout, FNP.  The CCM team was consulted for assistance with disease management and care coordination needs.    Engaged with patient by telephone for follow up visit in response to provider referral for pharmacy case management and/or care coordination services.   Consent to Services:  The patient was given information about Chronic Care Management services, agreed to services, and gave verbal consent prior to initiation of services.  Please see initial visit note for detailed documentation.   Patient Care Team: Gwyneth Sprout, FNP as PCP - General (Family Medicine) Lorelee Cover., MD as Consulting Physician (Ophthalmology) Germaine Pomfret, [place] as Pharmacist (Pharmacist)  Recent office visits: 06/15/21: Patient presented to Dr. Brita Romp for follow-up. A1c 6.1%.   Recent consult visits: None in previous 6 months  Hospital visits: None in previous 6 months   Objective:  Lab Results  Component Value Date   CREATININE 0.68 06/17/2021   BUN 18 06/17/2021   GFRNONAA 60 12/04/2019   GFRAA 69 12/04/2019   NA 138 06/17/2021   K 4.1 06/17/2021   CALCIUM 10.0 06/17/2021   CO2 26 06/17/2021   GLUCOSE 93 06/17/2021    Lab Results  Component Value Date/Time   HGBA1C 6.1 (A) 06/15/2021 11:18 AM   HGBA1C 6.5 (A) 03/15/2021 10:35 AM   HGBA1C 6.9 (H) 12/04/2019 10:11 AM   HGBA1C 6.8 (H) 09/18/2018 09:12 AM   MICROALBUR Negative 09/29/2015 11:26 AM    Last diabetic Eye exam:  Lab Results  Component Value  Date/Time   HMDIABEYEEXA No Retinopathy 01/12/2021 12:00 AM    Last diabetic Foot exam: No results found for: HMDIABFOOTEX   Lab Results  Component Value Date   CHOL 139 06/17/2021   HDL 31 (L) 06/17/2021   LDLCALC 75 06/17/2021   TRIG 192 (H) 06/17/2021   CHOLHDL 4.5 (H) 06/17/2021    Hepatic Function Latest Ref Rng & Units 06/17/2021 12/04/2019 09/18/2018  Total Protein 6.0 - 8.5 g/dL 7.5 7.6 7.4  Albumin 3.8 - 4.8 g/dL 4.1 4.3 4.4  AST 0 - 40 IU/L 26 55(H) 51(H)  ALT 0 - 32 IU/L 34(H) 43(H) 58(H)  Alk Phosphatase 44 - 121 IU/L 76 64 54  Total Bilirubin 0.0 - 1.2 mg/dL 0.4 0.5 0.2    Lab Results  Component Value Date/Time   TSH 2.060 06/17/2021 01:38 PM   TSH 2.290 12/04/2019 10:11 AM    CBC Latest Ref Rng & Units 12/04/2019 09/18/2018 01/16/2018  WBC 3.4 - 10.8 x10E3/uL 7.0 6.6 7.4  Hemoglobin 11.1 - 15.9 g/dL 15.1 15.2 14.6  Hematocrit 34.0 - 46.6 % 43.8 43.5 41.9  Platelets 150 - 450 x10E3/uL 237 241 217    Lab Results  Component Value Date/Time   VD25OH 48.2 09/18/2018 09:12 AM   VD25OH 52.7 04/20/2016 08:04 AM    Clinical ASCVD: No  The 10-year ASCVD risk score (Arnett DK, et al., 2019) is: 26.6%   Values used to calculate the score:     Age: 23 years     Sex: Female  Is Non-Hispanic African American: No     Diabetic: Yes     Tobacco smoker: Yes     Systolic Blood Pressure: 798 mmHg     Is BP treated: Yes     HDL Cholesterol: 31 mg/dL     Total Cholesterol: 139 mg/dL    Depression screen [facility] 2/9 03/15/2021 12/14/2020 06/04/2020  Decreased Interest 0 0 0  Down, Depressed, Hopeless 0 0 0  PHQ - 2 Score 0 0 0  Altered sleeping 0 - -  Tired, decreased energy 0 - -  Change in appetite 0 - -  Feeling bad or failure about yourself  0 - -  Trouble concentrating 0 - -  Moving slowly or fidgety/restless 0 - -  Suicidal thoughts 0 - -  PHQ-9 Score 0 - -  Difficult doing work/chores Not difficult at all - -     Social History   Tobacco Use  Smoking  Status Every Day   Packs/day: 2.00   Years: 49.00   Pack years: 98.00   Types: Cigarettes  Smokeless Tobacco Never   BP Readings from Last 3 Encounters:  06/15/21 (!) 113/56  03/15/21 (!) 165/82  12/28/20 (!) 180/96   Pulse Readings from Last 3 Encounters:  06/15/21 80  03/15/21 62  12/28/20 67   Wt Readings from Last 3 Encounters:  06/15/21 161 lb (73 kg)  03/15/21 173 lb (78.5 kg)  12/28/20 184 lb 4.8 oz (83.6 kg)   BMI Readings from Last 3 Encounters:  06/15/21 29.45 kg/m  03/15/21 30.65 kg/m  12/28/20 32.65 kg/m    Assessment/Interventions: Review of patient past medical history, allergies, medications, health status, including review of consultants reports, laboratory and other test data, was performed as part of comprehensive evaluation and provision of chronic care management services.   SDOH:  (Social Determinants of Health) assessments and interventions performed: Yes SDOH Interventions    Flowsheet Row Most Recent Value  SDOH Interventions   Financial Strain Interventions Intervention Not Indicated       SDOH Screenings   Alcohol Screen: Low Risk    Last Alcohol Screening Score (AUDIT): 0  Depression (PHQ2-9): Low Risk    PHQ-2 Score: 0  Financial Resource Strain: Low Risk    Difficulty of Paying Living Expenses: Not hard at all  Food Insecurity: No Food Insecurity   Worried About Charity fundraiser in the Last Year: Never true   Ran Out of Food in the Last Year: Never true  Housing: Low Risk    Last Housing Risk Score: 0  Physical Activity: Inactive   Days of Exercise per Week: 0 days   Minutes of Exercise per Session: 0 min  Social Connections: Socially Isolated   Frequency of Communication with Friends and Family: More than three times a week   Frequency of Social Gatherings with Friends and Family: More than three times a week   Attends Religious Services: Never   Marine scientist or Organizations: No   Attends Archivist  Meetings: Never   Marital Status: Widowed  Stress: No Stress Concern Present   Feeling of Stress : Only a little  Tobacco Use: High Risk   Smoking Tobacco Use: Every Day   Smokeless Tobacco Use: Never   Passive Exposure: Not on file  Transportation Needs: No Transportation Needs   Lack of Transportation (Medical): No   Lack of Transportation (Non-Medical): No    CCM Care Plan  Allergies  Allergen Reactions   Amlodipine  Nausea Only and Other (See Comments)    Stomach cramping   Atorvastatin Nausea And Vomiting   Lisinopril Cough   Pravastatin Nausea And Vomiting   Nifedipine Er Other (See Comments)    headache    Medications Reviewed Today     Reviewed by Germaine Pomfret, [facility] (Pharmacist) on 11/23/21 at 1155  Med List Status: <None>   Medication Order Taking? Sig Documenting Provider Last Dose Status Informant  aspirin 81 MG tablet [id number]  Take 81 mg by mouth daily. [provider]  Active   Bisacodyl (GENTLE LAXATIVE PO) [id number]  Take by mouth. [provider]  Active   Blood Glucose Monitoring Suppl [facility] VERIO) w/Device KIT [id number]  To check blood sugar daily [name], [place]  Active   calcium carbonate (OS-CAL) 600 MG TABS tablet [id number]  Take 1 tablet (600 mg total) by mouth 2 (two) times daily with a meal. Forest Gleason, MD  Active   CANNABIDIOL PO [id number]  Take 500 mg by mouth daily.  Patient not taking: Reported on 06/15/2021   [provider]  Active   Cholecalciferol (D3 ADULT PO) [id number]  Take 1 tablet by mouth daily. 1000 units daily [provider]  Active   Collagen-Vitamin C-Biotin (COLLAGEN 1500/C PO) [id number]  Take by mouth. [provider]  Active   diltiazem (CARDIZEM SR) 120 MG 12 hr capsule [id number]  Take 1 capsule (120 mg total) by mouth 2 (two) times daily. Virginia Crews, MD  Active   Dulaglutide (TRULICITY) 1.5 NA/3.5TD [facility] [id number]  Inject 1.5 mg into the skin once a  week. Via [employer] patient assistance through Dec 2023 Virginia Crews, MD  Active   FIBER PO [id number]  Take by mouth 2 (two) times daily. [provider]  Active   fluticasone (FLONASE) 50 MCG/ACT nasal spray [id number] Yes SPRAY 2 SPRAYS INTO EACH NOSTRIL EVERY DAY Bacigalupo, Dionne Bucy, MD Taking Active   glucose blood [facility] VERIO) test strip [id number]  To check blood sugar daily [name], PA-C  Active   Lancets [facility] ULTRASOFT) lancets [id number]  To check blood sugar daily [place]  Active   levothyroxine (SYNTHROID) 88 MCG tablet [id number]  Take 1 tablet (88 mcg total) by mouth daily. Virginia Crews, MD  Active   losartan-hydrochlorothiazide [facility]) 100-25 MG tablet [id number]  Take 1 tablet by mouth daily. Virginia Crews, MD  Active   lovastatin (MEVACOR) 20 MG tablet [id number]  Take 1 tablet (20 mg total) by mouth at bedtime. Virginia Crews, MD  Active   metoprolol (TOPROL-XL) 200 MG 24 hr tablet [id number]  Take 1 tablet (200 mg total) by mouth daily. Virginia Crews, MD  Active   Misc Natural Products (T-RELIEF CBD+13 SL) [id number]  Place 500 mg under the tongue daily. [provider]  Active Self  Nutritional Supplements (QUINOA/KALE/HEMP PO) [id number]  Place 750 mg under the tongue. [provider]  Active   Omega-3 Fatty Acids (FISH OIL) 1200 MG CAPS [id number]  Take by mouth daily. 2400 qam 1200 mg qpm [provider]  Active   potassium chloride (KLOR-CON M10) 10 MEQ tablet [id number]  Take 1 tablet (10 mEq total) by mouth daily. Virginia Crews, MD  Active             Patient Active Problem List   Diagnosis Date Noted   Hyperlipidemia associated with type 2 diabetes mellitus ([place]) 06/15/2021   Overweight 06/15/2021  Statin intolerance 12/29/2020   Benign neoplasm of sigmoid colon    Mass of right breast 02/17/2016   History of parotitis 01/05/2016   Hypothyroidism  09/29/2015   Hyperlipemia 09/29/2015   Newly recognized murmur 09/29/2015   Allergic rhinitis 08/27/2015   Anxiety 08/27/2015   Type 2 diabetes mellitus with other diabetic kidney complication ([place]) 46/50/3546   Avitaminosis D 08/27/2015   Compulsive tobacco user syndrome 08/27/2015   Hypertension associated with diabetes ([place]) 07/01/2015    Immunization History  Administered Date(s) Administered   Fluad Quad(high Dose 65+) 10/15/2020   Influenza Split 10/21/2010, 10/13/2012   Influenza, High Dose Seasonal PF 10/04/2018, 09/28/2021   Influenza,inj,Quad PF,6+ Mos 10/02/2014, 09/09/2015   Influenza-Unspecified 09/21/2016, 10/03/2017, 09/20/2019   PFIZER Comirnaty(Gray Top)Covid-19 Tri-Sucrose Vaccine 07/13/2021   PFIZER(Purple Top)SARS-COV-2 Vaccination 02/18/2020, 02/23/2020, 09/10/2020   Pneumococcal Conjugate-13 07/21/2017   Pneumococcal Polysaccharide-23 01/22/2013, 08/23/2018   Tdap 10/21/2010   Zoster Recombinat (Shingrix) 04/27/2020, 01/27/2021   Zoster, Live 10/03/2013    Conditions to be addressed/monitored:  Hypertension, Hyperlipidemia, Diabetes, and Hypothyroidism  Care Plan : General Pharmacy (Adult)  Updates made by Germaine Pomfret, [place] since 11/23/2021 12:00 AM     Problem: Hypertension, Hyperlipidemia, Diabetes, and Hypothyroidism   Priority: High     Long-Range Goal: Patient-Specific Goal   Start Date: 08/12/2021  Expected End Date: 08/12/2022  This Visit's Progress: On track  Recent Progress: On track  Priority: High  Note:   Current Barriers:  Unable to independently afford treatment regimen  Pharmacist Clinical Goal(s):  Patient will verbalize ability to afford treatment regimen maintain control of blood pressure as evidenced by BP less than 140/90  through collaboration with PharmD and provider.   Interventions: 1:1 collaboration with Gwyneth Sprout, FNP regarding development and update of comprehensive plan of care as evidenced by provider  attestation and co-signature Inter-disciplinary care team collaboration (see longitudinal plan of care) Comprehensive medication review performed; medication list updated in electronic medical record  Hypertension (BP goal <140/90) -Controlled -History of heart murmur -Current treatment: Diltiazem SR 120 mg AM, 240 mg PM  Losartan-HCTZ 100-25 mg daily  Metoprolol XL 200 mg daily  -Medications previously tried: Olmesartan-Amlodipine-HCTZ   -Current home readings:  141/75; pulse 65  137/71; pulse 70  125/63; pulse 70 159/81;  141/68; pulse 68  -Denies hypotensive/hypertensive symptoms -Continue current medications  Hyperlipidemia: (LDL goal < 100) -Controlled -Current treatment: Lovastatin 20 mg nightly  -Medications previously tried: NA  -Recommended to continue current medication  Diabetes (A1c goal <7%) -Controlled -Current medications: Trulicity 1.5 mg weekly  -Medications previously tried: NA  -Current home glucose readings fasting glucose: Not routinely monitoring  -Denies hypoglycemic/hyperglycemic symptoms -Working on weight loss via [employer]. Lost 36 pounds since starting.  -Recommended to continue current medication  Hypothyroidism (Goal: Maintain stable thyroid function) -Controlled -Current treatment  Levothyroxine 88 mcg daily  -Medications previously tried: NA  -Recommended to continue current medication  Allergic Rhinitis (Goal: Minimize symptoms) -Controlled -Current treatment  Flonase 50 mcg/act 2 sprays nightly  -Medications previously tried: NA -Scratchy throat, otherwise states symptoms well controlled -Continue current medications  Patient Goals/Self-Care Activities Patient will:  - check blood pressure weekly, document, and provide at future appointments  Follow Up Plan: Telephone follow up appointment with care management team member scheduled for:  02/21/2022 at 11:00 AM       Medication Assistance:  Trulicity obtained through  [employer] medication assistance program.  Enrollment ends Dec 2023  Compliance/Adherence/Medication fill history: Care Gaps:  Influenza  Star-Rating Drugs: Losartan: Last filled 06/30/21 for 90-DS  [id number] [place] [place]Phone: [phone]: [phone] Uses pill box? Yes Pt endorses 100% compliance  We discussed: Current pharmacy is preferred with insurance plan and patient is satisfied with pharmacy services Patient decided to: Continue current medication management strategy  Care Plan and Follow Up Patient Decision:  Patient agrees to Care Plan and Follow-up.  Plan: Telephone follow up appointment with care management team member scheduled for:  02/21/2022 at 11:00 AM   AJunius Argyle PharmD, [phone]

## 2021-11-22 NOTE — Progress Notes (Signed)
    Chronic Care Management Pharmacy Assistant   Name: [name]  MRN: [id number] DOB: 1951-11-07  Patient called to be reminded of her telephone appointment with [name], CPP on 12/06 @ 1100  Patient aware of appointment date, time, and type of appointment (either telephone or in person). Patient aware to have/bring all medications, supplements, blood pressure and/or blood sugar logs to visit.  Questions: Are there any concerns you would like to discuss during your office visit? Nothing she can think of at this time she was out so didn't have time to talk  Are you having any problems obtaining your medications? Not at this time  If patient has any PAP medications ask if they are having any problems getting their PAP medication or refill? Patient renewal application was faxed on 11/29 to [facility] for 2023. I contacted [employer] to check the status of the patient's renewal application, and spoke with Sabino Snipes. Sabino Snipes confirmed that the 6761 renewal application was received and that the patient has been approved for the 2023 year. Patient renewal is good until 12/18/2022, and the pharmacy for [name] will contact her for her first shipment after 12/19/2021. Patient updated of her approval.   Star Rating Drug: Lovastatin 20 mg last filled on 09/115/2022 [facility]Day [facility] Losartan-HCTZ 100-25 mg last filled on 09/06/2021 for a [facility]-Day supply with [facility] Trulicity 1.5 mg patient receives this medication through [employer]  Any gaps in medications fill history? No  Care Gaps: Last A1C was 06/15/2021   Lynann Bologna, CPA/CMA Clinical Pharmacist Assistant Phone: [phone]

## 2021-11-23 ENCOUNTER — Telehealth: Payer: PPO

## 2021-11-23 ENCOUNTER — Ambulatory Visit (INDEPENDENT_AMBULATORY_CARE_PROVIDER_SITE_OTHER): Payer: PPO

## 2021-11-23 DIAGNOSIS — E1159 Type 2 diabetes mellitus with other circulatory complications: Secondary | ICD-10-CM

## 2021-11-23 DIAGNOSIS — J302 Other seasonal allergic rhinitis: Secondary | ICD-10-CM

## 2021-11-23 DIAGNOSIS — E1129 Type 2 diabetes mellitus with other diabetic kidney complication: Secondary | ICD-10-CM

## 2021-11-23 DIAGNOSIS — I152 Hypertension secondary to endocrine disorders: Secondary | ICD-10-CM

## 2021-11-23 MED ORDER — TRULICITY 1.5 MG/0.5ML ~~LOC~~ SOAJ: 1.5000 mg | SUBCUTANEOUS | Status: DC

## 2021-11-23 NOTE — Patient Instructions (Signed)
Visit Information It was great speaking with you today!  Please let me know if you have any questions about our visit.   Goals Addressed             This Visit's Progress    Track and Manage My Blood Pressure-Hypertension   On track    Timeframe:  Long-Range Goal Priority:  High Start Date: 08/10/2021                             Expected End Date:  08/10/2022                     Follow Up within 90 days   - check blood pressure weekly - write blood pressure results in a log or diary    Why is this important?   You won't feel high blood pressure, but it can still hurt your blood vessels.  High blood pressure can cause heart or kidney problems. It can also cause a stroke.  Making lifestyle changes like losing a little weight or eating less salt will help.  Checking your blood pressure at home and at different times of the day can help to control blood pressure.  If the doctor prescribes medicine remember to take it the way the doctor ordered.  Call the office if you cannot afford the medicine or if there are questions about it.     Notes:         Patient Care Plan: General Pharmacy (Adult)     Problem Identified: Hypertension, Hyperlipidemia, Diabetes, and Hypothyroidism   Priority: High     Long-Range Goal: Patient-Specific Goal   Start Date: 08/12/2021  Expected End Date: 08/12/2022  This Visit's Progress: On track  Recent Progress: On track  Priority: High  Note:   Current Barriers:  Unable to independently afford treatment regimen  Pharmacist Clinical Goal(s):  Patient will verbalize ability to afford treatment regimen maintain control of blood pressure as evidenced by BP less than 140/90  through collaboration with PharmD and provider.   Interventions: 1:1 collaboration with Gwyneth Sprout, FNP regarding development and update of comprehensive plan of care as evidenced by provider attestation and co-signature Inter-disciplinary care team collaboration (see  longitudinal plan of care) Comprehensive medication review performed; medication list updated in electronic medical record  Hypertension (BP goal <140/90) -Controlled -History of heart murmur -Current treatment: Diltiazem SR 120 mg AM, 240 mg PM  Losartan-HCTZ 100-25 mg daily  Metoprolol XL 200 mg daily  -Medications previously tried: Olmesartan-Amlodipine-HCTZ   -Current home readings:  141/75; pulse 65  137/71; pulse 70  125/63; pulse 70 159/81;  141/68; pulse 68  -Denies hypotensive/hypertensive symptoms -Continue current medications  Hyperlipidemia: (LDL goal < 100) -Controlled -Current treatment: Lovastatin 20 mg nightly  -Medications previously tried: NA  -Recommended to continue current medication  Diabetes (A1c goal <7%) -Controlled -Current medications: Trulicity 1.5 mg weekly  -Medications previously tried: NA  -Current home glucose readings fasting glucose: Not routinely monitoring  -Denies hypoglycemic/hyperglycemic symptoms -Working on weight loss via [employer]. Lost 36 pounds since starting.  -Recommended to continue current medication  Hypothyroidism (Goal: Maintain stable thyroid function) -Controlled -Current treatment  Levothyroxine 88 mcg daily  -Medications previously tried: NA  -Recommended to continue current medication  Allergic Rhinitis (Goal: Minimize symptoms) -Controlled -Current treatment  Flonase 50 mcg/act 2 sprays nightly  -Medications previously tried: NA -Scratchy throat, otherwise states symptoms well controlled -Continue current  medications  Patient Goals/Self-Care Activities Patient will:  - check blood pressure weekly, document, and provide at future appointments  Follow Up Plan: Telephone follow up appointment with care management team member scheduled for:  02/21/2022 at 11:00 AM     Patient agreed to services and verbal consent obtained.   Patient verbalizes understanding of instructions provided today and agrees  to view in [place].   [name], PharmD, Para [place], CPP  Clinical Pharmacist Practitioner  [facility] [phone]

## 2021-12-14 ENCOUNTER — Other Ambulatory Visit: Payer: Self-pay | Admitting: Family Medicine

## 2021-12-14 DIAGNOSIS — I10 Essential (primary) hypertension: Secondary | ICD-10-CM

## 2021-12-16 ENCOUNTER — Ambulatory Visit (INDEPENDENT_AMBULATORY_CARE_PROVIDER_SITE_OTHER): Payer: PPO

## 2021-12-16 DIAGNOSIS — Z0000 Encounter for general adult medical examination without abnormal findings: Secondary | ICD-10-CM | POA: Diagnosis not present

## 2021-12-16 NOTE — Progress Notes (Signed)
Virtual Visit via Telephone Note  I connected with  Heather Burke on 12/16/21 at  9:00 AM EST by telephone and verified that I am speaking with the correct person using two identifiers.  Location: Patient: home Provider: [facility] Persons participating in the virtual visit: [place]   I discussed the limitations, risks, security and privacy concerns of performing an evaluation and management service by telephone and the availability of in person appointments. The patient expressed understanding and agreed to proceed.  Interactive audio and video telecommunications were attempted between this nurse and patient, however failed, due to patient having technical difficulties OR patient did not have access to video capability.  We continued and completed visit with audio only.  Some vital signs may be absent or patient reported.   Dionisio David, LPN  Subjective:   Heather Burke is a 70 y.o. female who presents for Medicare Annual (Subsequent) preventive examination.  Review of Systems           Objective:    There were no vitals filed for this visit. There is no height or weight on file to calculate BMI.  Advanced Directives 12/14/2020 08/23/2018 01/16/2018 07/21/2017 07/07/2017 12/22/2016 08/25/2016  Does Patient Have a Medical Advance Directive? No Yes No No No No No  Type of Advance Directive - [place] in Chart? - No - copy requested - - - - -  Would patient like information on creating a medical advance directive? No - Patient declined - - No - Patient declined - - -    Current Medications (verified) Outpatient Encounter Medications as of 12/16/2021  Medication Sig   aspirin 81 MG tablet Take 81 mg by mouth daily.   Bisacodyl (GENTLE LAXATIVE PO) Take by mouth.   Blood Glucose Monitoring Suppl (ONETOUCH VERIO) w/Device KIT To check blood sugar daily   calcium carbonate (OS-CAL) 600 MG TABS  tablet Take 1 tablet (600 mg total) by mouth 2 (two) times daily with a meal.   CANNABIDIOL PO Take 500 mg by mouth daily. (Patient not taking: Reported on 06/15/2021)   Cholecalciferol (D3 ADULT PO) Take 1 tablet by mouth daily. 1000 units daily   Collagen-Vitamin C-Biotin (COLLAGEN 1500/C PO) Take by mouth.   diltiazem (CARDIZEM SR) 120 MG 12 hr capsule TAKE 1 CAPSULE (120MG) IN THE MORNING AND 2 CAPSULES (240MG) IN THE EVENING   Dulaglutide (TRULICITY) 1.5 OM/3.5DH SOPN Inject 1.5 mg into the skin once a week. [place] patient assistance through Dec 2023   FIBER PO Take by mouth 2 (two) times daily.   fluticasone (FLONASE) 50 MCG/ACT nasal spray SPRAY 2 SPRAYS INTO EACH NOSTRIL EVERY DAY   glucose blood (ONETOUCH VERIO) test strip To check blood sugar daily   Lancets (ONETOUCH ULTRASOFT) lancets To check blood sugar daily   levothyroxine (SYNTHROID) 88 MCG tablet Take 1 tablet (88 mcg total) by mouth daily.   losartan-hydrochlorothiazide (HYZAAR) 100-25 MG tablet Take 1 tablet by mouth daily.   lovastatin (MEVACOR) 20 MG tablet Take 1 tablet (20 mg total) by mouth at bedtime.   metoprolol (TOPROL-XL) 200 MG 24 hr tablet Take 1 tablet (200 mg total) by mouth daily.   Misc Natural Products (T-RELIEF CBD+13 SL) Place 500 mg under the tongue daily.   Nutritional Supplements (QUINOA/KALE/HEMP PO) Place 750 mg under the tongue.   Omega-3 Fatty Acids (FISH OIL) 1200 MG CAPS Take by mouth  daily. 2400 qam 1200 mg qpm   potassium chloride (KLOR-CON M10) 10 MEQ tablet Take 1 tablet (10 mEq total) by mouth daily.   No facility-administered encounter medications on file as of 12/16/2021.    Allergies (verified) Amlodipine, Atorvastatin, Lisinopril, Pravastatin, and Nifedipine er   History: Past Medical History:  Diagnosis Date   Breast cancer ([place]) 06/08/2015   T1c, N0;  ER+,PR+, Her 2 neg (FISH neg) x2., SLN x 3 negative. Mammoprint: Low risk. Multifocal.Invasive mammary and lobular  carcinoma.    Diabetes mellitus without complication (HCC)    GERD (gastroesophageal reflux disease)    Heart murmur 2016   newly diagnosed, slight murmur   Hyperlipidemia    Hypertension    Hypothyroidism    Lupus (HCC)    PONV (postoperative nausea and vomiting)    with Hysterectomy   Thyroid disease    Past Surgical History:  Procedure Laterality Date   ABDOMINAL HYSTERECTOMY     BREAST BIOPSY Right 2002   negative/ Dr. Bary Castilla   BREAST BIOPSY Left 06-08-15   INVASIVE MAMMARY CARCINOMA WITH PARTIAL SOLID PATTERN.    BREAST BIOPSY Right 06/26/2018   FIBROADENOMATOUS CHANGES WITH STROMAL HYALINIZATION AND CALCIFICATIONS   COLONOSCOPY WITH PROPOFOL N/A 09/13/2016   Procedure: COLONOSCOPY WITH PROPOFOL;  Surgeon: Lucilla Lame, MD;  Location: [facility] ENDOSCOPY;  Service: Endoscopy;  Laterality: N/A;   core biopsy Left 06/08/15   GUM SURGERY  2006   MASTECTOMY Left 2016   OOPHORECTOMY     SENTINEL NODE BIOPSY Left 08/18/2015   Procedure: SENTINEL NODE BIOPSY;  Surgeon: [name], MD;  Location: [facility] ORS;  Service: General;  Laterality: Left;   SIMPLE MASTECTOMY WITH AXILLARY SENTINEL NODE BIOPSY Left 08/18/2015   Procedure: SIMPLE MASTECTOMY;  Surgeon: [name], MD;  Location: [facility] ORS;  Service: General;  Laterality: Left;   TONSILLECTOMY     Family History  Problem Relation Age of Onset   Cancer Father        prostate   Thyroid disease Daughter    Heart attack Sister    Breast cancer Cousin    Breast cancer Cousin    Breast cancer Other    Social History   Socioeconomic History   Marital status: Widowed    Spouse name: Not on file   Number of children: 2   Years of education: H/S   Highest education level: High school graduate  Occupational History   Occupation: Part-Time    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  Tobacco Use   Smoking status: Every Day    Packs/day: 2.00    Years: 49.00    Pack years: 98.00    Types: Cigarettes   Smokeless  tobacco: Never  Vaping Use   Vaping Use: Never used  Substance and Sexual Activity   Alcohol use: Yes    Alcohol/week: 14.0 - 28.0 standard drinks    Types: 14 - 28 Cans of beer per week    Comment: 2-3 beers a day - declined cutting back anymore   Drug use: No   Sexual activity: Not on file  Other Topics Concern   Not on file  Social History Narrative   Not on file   Social Determinants of Health   Financial Resource Strain: Low Risk    Difficulty of Paying Living Expenses: Not hard at all  Food Insecurity: Not on file  Transportation Needs: Not on file  Physical Activity: Not on file  Stress: Not on file  Social Connections: Not on file    Tobacco Counseling Ready to quit: Not Answered Counseling given: Not Answered   Clinical Intake:  Pre-visit preparation completed: Yes  Pain : No/denies pain     Nutritional Risks: None Diabetes: Yes CBG done?: No Did pt. bring in CBG monitor from home?: No  How often do you need to have someone help you when you read instructions, pamphlets, or other written materials from your doctor or pharmacy?: 1 - Never  Diabetic?yes Nutrition Risk Assessment:  Has the patient had any N/V/D within the last 2 months?  No  Does the patient have any non-healing wounds?  No  Has the patient had any unintentional weight loss or weight gain?  No   Diabetes:  Is the patient diabetic?  Yes  If diabetic, was a CBG obtained today?  No  Did the patient bring in their glucometer from home?  No  How often do you monitor your CBG's? Every day.   Financial Strains and Diabetes Management:  Are you having any financial strains with the device, your supplies or your medication? No .  Does the patient want to be seen by Chronic Care Management for management of their diabetes?  No  Would the patient like to be referred to a Nutritionist or for Diabetic Management?  No   Diabetic Exams:  Diabetic Eye Exam: Completed 01/12/21 (negative  retinopathy).   Diabetic Foot Exam: Completed 06/15/21. Pt has been advised about the importance in completing this exam.   Interpreter Needed?: No  Information entered by :: Kirke Shaggy, LPN   Activities of Daily Living In your present state of health, do you have any difficulty performing the following activities: 03/15/2021  Hearing? N  Vision? Y  Difficulty concentrating or making decisions? N  Walking or climbing stairs? N  Dressing or bathing? N  Doing errands, shopping? N  Some recent data might be hidden    Patient Care Team: Gwyneth Sprout, FNP as PCP - General (Family Medicine) Lorelee Cover., MD as Consulting Physician (Ophthalmology) Germaine Pomfret, [facility] as Pharmacist (Pharmacist)  Indicate any recent Medical Services you may have received from other than [facility] providers in the past year (date may be approximate).     Assessment:   This is a routine wellness examination for [facility].  Hearing/Vision screen No results found.  Dietary issues and exercise activities discussed:     Goals Addressed   None    Depression Screen PHQ 2/9 Scores 03/15/2021 12/14/2020 06/04/2020 08/23/2018 07/21/2017 07/21/2017  PHQ - 2 Score 0 0 0 0 0 0  PHQ- 9 Score 0 - - - - -    Fall Risk Fall Risk  03/15/2021 12/14/2020 12/04/2019 11/13/2019 08/23/2018  Falls in the past year? 0 0 0 1 No  Comment - - - [facility]i Telephone Survey: data to providers prior to load -  Number falls in past yr: 0 0 0 1 -  Comment - - - Emmi Telephone Survey Actual Response = 9 -  Injury with Fall? 0 0 0 0 -  Risk for fall due to : No Fall Risks - - - -  Follow up Falls evaluation completed - Falls evaluation completed - -    FALL RISK PREVENTION PERTAINING TO THE HOME:  Any stairs in or around the home? Yes  If so, are there any without handrails? No  Home free of loose throw rugs in walkways, pet beds, electrical cords, etc? Yes  Adequate lighting in  your home to reduce risk of falls? Yes    ASSISTIVE DEVICES UTILIZED TO PREVENT FALLS:  Life alert? No  Use of a cane, walker or w/c? No  Grab bars in the bathroom? No  Shower chair or bench in shower? No  Elevated toilet seat or a handicapped toilet? No   Cognitive Function:Normal cognitive status assessed by direct observation by this Nurse Health Advisor. No abnormalities found.       6CIT Screen 12/04/2019  What Year? 0 points  What month? 0 points  What time? 0 points  Count back from 20 0 points  Months in reverse 0 points  Repeat phrase 2 points  Total Score 2    Immunizations Immunization History  Administered Date(s) Administered   Fluad Quad(high Dose 65+) 10/15/2020   Influenza Split 10/21/2010, 10/13/2012   Influenza, High Dose Seasonal PF 10/04/2018, 09/28/2021   Influenza,inj,Quad PF,6+ Mos 10/02/2014, 09/09/2015   Influenza-Unspecified 09/21/2016, 10/03/2017, 09/20/2019   PFIZER Comirnaty(Gray Top)Covid-19 Tri-Sucrose Vaccine 07/13/2021   PFIZER(Purple Top)SARS-COV-2 Vaccination 02/18/2020, 02/23/2020, 09/10/2020   Pneumococcal Conjugate-13 07/21/2017   Pneumococcal Polysaccharide-23 01/22/2013, 08/23/2018   Tdap 10/21/2010   Zoster Recombinat (Shingrix) 04/27/2020, 01/27/2021   Zoster, Live 10/03/2013    TDAP status: Due, Education has been provided regarding the importance of this vaccine. Advised may receive this vaccine at local pharmacy or Health Dept. Aware to provide a copy of the vaccination record if obtained from local pharmacy or Health Dept. Verbalized acceptance and understanding.  Flu Vaccine status: Up to date  Pneumococcal vaccine status: Up to date  Covid-19 vaccine status: Completed vaccines  Qualifies for Shingles Vaccine? Yes   Zostavax completed Yes   Shingrix Completed?: Yes  Screening Tests Health Maintenance  Topic Date Due   TETANUS/TDAP  10/21/2020   COVID-19 Vaccine (5 - Booster) 09/07/2021   HEMOGLOBIN A1C  12/15/2021   OPHTHALMOLOGY EXAM  01/12/2022    FOOT EXAM  06/15/2022   MAMMOGRAM  03/13/2023   COLONOSCOPY (Pts 45-12yr Insurance coverage will need to be confirmed)  09/13/2026   Pneumonia Vaccine 69 Years old  Completed   INFLUENZA VACCINE  Completed   DEXA SCAN  Completed   Hepatitis C Screening  Completed   Zoster Vaccines- Shingrix  Completed   HPV VACCINES  Aged Out    Health Maintenance  Health Maintenance Due  Topic Date Due   TETANUS/TDAP  10/21/2020   COVID-19 Vaccine (5 - Booster) 09/07/2021   HEMOGLOBIN A1C  12/15/2021    Colorectal cancer screening: Type of screening: Colonoscopy. Completed 09/13/16. Repeat every 10 years  Mammogram status: Completed 03/12/21. Repeat every year  Bone Density status: Completed 01/07/16. Results reflect: Bone density results: NORMAL. Repeat every 5 years.  Lung Cancer Screening: (Low Dose CT Chest recommended if Age 69-80years, 30 pack-year currently smoking OR have quit w/in 15years.) does not qualify.    Additional Screening:  Hepatitis C Screening: does qualify; Completed 02/05/13  Vision Screening: Recommended annual ophthalmology exams for early detection of glaucoma and other disorders of the eye. Is the patient up to date with their annual eye exam?  Yes  Who is the provider or what is the name of the office in which the patient attends annual eye exams? Dr. PLorie ApleyIf pt is not established with a provider, would they like to be referred to a provider to establish care? No .   Dental Screening: Recommended annual dental exams for proper oral hygiene  Community Resource Referral / Chronic Care Management: CRR  required this visit?  No   CCM required this visit?  No      Plan:     I have personally reviewed and noted the following in the patients chart:   Medical and social history Use of alcohol, tobacco or illicit drugs  Current medications and supplements including opioid prescriptions.  Functional ability and status Nutritional status Physical  activity Advanced directives List of other physicians Hospitalizations, surgeries, and ER visits in previous 12 months Vitals Screenings to include cognitive, depression, and falls Referrals and appointments  In addition, I have reviewed and discussed with patient certain preventive protocols, quality metrics, and best practice recommendations. A written personalized care plan for preventive services as well as general preventive health recommendations were provided to patient.     Dionisio David, LPN   [id number]   Nurse Notes: none

## 2021-12-16 NOTE — Patient Instructions (Signed)
Heather Burke , Thank you for taking time to come for your Medicare Wellness Visit. I appreciate your ongoing commitment to your health goals. Please review the following plan we discussed and let me know if I can assist you in the future.   Screening recommendations/referrals: Colonoscopy: 09/13/16 Mammogram: 03/12/21 Bone Density: 01/07/16, declined referral Recommended yearly ophthalmology/optometry visit for glaucoma screening and checkup Recommended yearly dental visit for hygiene and checkup  Vaccinations: Influenza vaccine: 09/28/21 Pneumococcal vaccine: 08/23/18 Tdap vaccine: 10/21/10, due Shingles vaccine: Shingrix 04/27/20, 01/27/21   Zoster 10/03/13   Covid-19:02/18/20, 02/23/20, 09/10/20, 07/13/21  Advanced directives: no  Conditions/risks identified: none  Next appointment: Follow up in one year for your annual wellness visit 12/20/21 @ 10:20am   Preventive Care 65 Years and Older, Female Preventive care refers to lifestyle choices and visits with your health care provider that can promote health and wellness. What does preventive care include? A yearly physical exam. This is also called an annual well check. Dental exams once or twice a year. Routine eye exams. Ask your health care provider how often you should have your eyes checked. Personal lifestyle choices, including: Daily care of your teeth and gums. Regular physical activity. Eating a healthy diet. Avoiding tobacco and drug use. Limiting alcohol use. Practicing safe sex. Taking low-dose aspirin every day. Taking vitamin and mineral supplements as recommended by your health care provider. What happens during an annual well check? The services and screenings done by your health care provider during your annual well check will depend on your age, overall health, lifestyle risk factors, and family history of disease. Counseling  Your health care provider may ask you questions about your: Alcohol use. Tobacco use. Drug  use. Emotional well-being. Home and relationship well-being. Sexual activity. Eating habits. History of falls. Memory and ability to understand (cognition). Work and work Statistician. Reproductive health. Screening  You may have the following tests or measurements: Height, weight, and BMI. Blood pressure. Lipid and cholesterol levels. These may be checked every 5 years, or more frequently if you are over 86 years old. Skin check. Lung cancer screening. You may have this screening every year starting at age 16 if you have a 30-pack-year history of smoking and currently smoke or have quit within the past 15 years. Fecal occult blood test (FOBT) of the stool. You may have this test every year starting at age 51. Flexible sigmoidoscopy or colonoscopy. You may have a sigmoidoscopy every 5 years or a colonoscopy every 10 years starting at age 16. Hepatitis C blood test. Hepatitis B blood test. Sexually transmitted disease (STD) testing. Diabetes screening. This is done by checking your blood sugar (glucose) after you have not eaten for a while (fasting). You may have this done every 1-3 years. Bone density scan. This is done to screen for osteoporosis. You may have this done starting at age 8. Mammogram. This may be done every 1-2 years. Talk to your health care provider about how often you should have regular mammograms. Talk with your health care provider about your test results, treatment options, and if necessary, the need for more tests. Vaccines  Your health care provider may recommend certain vaccines, such as: Influenza vaccine. This is recommended every year. Tetanus, diphtheria, and acellular pertussis (Tdap, Td) vaccine. You may need a Td booster every 10 years. Zoster vaccine. You may need this after age 30. Pneumococcal 13-valent conjugate (PCV13) vaccine. One dose is recommended after age 23. Pneumococcal polysaccharide (PPSV23) vaccine. One dose is recommended after  age  60. Talk to your health care provider about which screenings and vaccines you need and how often you need them. This information is not intended to replace advice given to you by your health care provider. Make sure you discuss any questions you have with your health care provider. Document Released: 01/01/2016 Document Revised: 08/24/2016 Document Reviewed: 10/06/2015 Elsevier Interactive Patient Education  2017 [place] Prevention in the Home Falls can cause injuries. They can happen to people of all ages. There are many things you can do to make your home safe and to help prevent falls. What can I do on the outside of my home? Regularly fix the edges of walkways and driveways and fix any cracks. Remove anything that might make you trip as you walk through a door, such as a raised step or threshold. Trim any bushes or trees on the path to your home. Use bright outdoor lighting. Clear any walking paths of anything that might make someone trip, such as rocks or tools. Regularly check to see if handrails are loose or broken. Make sure that both sides of any steps have handrails. Any raised decks and porches should have guardrails on the edges. Have any leaves, snow, or ice cleared regularly. Use sand or salt on walking paths during winter. Clean up any spills in your garage right away. This includes oil or grease spills. What can I do in the bathroom? Use night lights. Install grab bars by the toilet and in the tub and shower. Do not use towel bars as grab bars. Use non-skid mats or decals in the tub or shower. If you need to sit down in the shower, use a plastic, non-slip stool. Keep the floor dry. Clean up any water that spills on the floor as soon as it happens. Remove soap buildup in the tub or shower regularly. Attach bath mats securely with double-sided non-slip rug tape. Do not have throw rugs and other things on the floor that can make you trip. What can I do in the  bedroom? Use night lights. Make sure that you have a light by your bed that is easy to reach. Do not use any sheets or blankets that are too big for your bed. They should not hang down onto the floor. Have a firm chair that has side arms. You can use this for support while you get dressed. Do not have throw rugs and other things on the floor that can make you trip. What can I do in the kitchen? Clean up any spills right away. Avoid walking on wet floors. Keep items that you use a lot in easy-to-reach places. If you need to reach something above you, use a strong step stool that has a grab bar. Keep electrical cords out of the way. Do not use floor polish or wax that makes floors slippery. If you must use wax, use non-skid floor wax. Do not have throw rugs and other things on the floor that can make you trip. What can I do with my stairs? Do not leave any items on the stairs. Make sure that there are handrails on both sides of the stairs and use them. Fix handrails that are broken or loose. Make sure that handrails are as long as the stairways. Check any carpeting to make sure that it is firmly attached to the stairs. Fix any carpet that is loose or worn. Avoid having throw rugs at the top or bottom of the stairs. If you do  have throw rugs, attach them to the floor with carpet tape. Make sure that you have a light switch at the top of the stairs and the bottom of the stairs. If you do not have them, ask someone to add them for you. What else can I do to help prevent falls? Wear shoes that: Do not have high heels. Have rubber bottoms. Are comfortable and fit you well. Are closed at the toe. Do not wear sandals. If you use a stepladder: Make sure that it is fully opened. Do not climb a closed stepladder. Make sure that both sides of the stepladder are locked into place. Ask someone to hold it for you, if possible. Clearly mark and make sure that you can see: Any grab bars or  handrails. First and last steps. Where the edge of each step is. Use tools that help you move around (mobility aids) if they are needed. These include: Canes. Walkers. Scooters. Crutches. Turn on the lights when you go into a dark area. Replace any light bulbs as soon as they burn out. Set up your furniture so you have a clear path. Avoid moving your furniture around. If any of your floors are uneven, fix them. If there are any pets around you, be aware of where they are. Review your medicines with your doctor. Some medicines can make you feel dizzy. This can increase your chance of falling. Ask your doctor what other things that you can do to help prevent falls. This information is not intended to replace advice given to you by your health care provider. Make sure you discuss any questions you have with your health care provider. Document Released: 10/01/2009 Document Revised: 05/12/2016 Document Reviewed: 01/09/2015 Elsevier Interactive Patient Education  2017 [employer].

## 2021-12-18 DIAGNOSIS — I152 Hypertension secondary to endocrine disorders: Secondary | ICD-10-CM

## 2021-12-18 DIAGNOSIS — E1159 Type 2 diabetes mellitus with other circulatory complications: Secondary | ICD-10-CM

## 2021-12-18 DIAGNOSIS — E1129 Type 2 diabetes mellitus with other diabetic kidney complication: Secondary | ICD-10-CM

## 2021-12-27 DIAGNOSIS — E113393 Type 2 diabetes mellitus with moderate nonproliferative diabetic retinopathy without macular edema, bilateral: Secondary | ICD-10-CM | POA: Diagnosis not present

## 2022-01-13 ENCOUNTER — Encounter: Payer: PPO | Admitting: Family Medicine

## 2022-01-14 ENCOUNTER — Other Ambulatory Visit: Payer: Self-pay

## 2022-01-14 ENCOUNTER — Ambulatory Visit (INDEPENDENT_AMBULATORY_CARE_PROVIDER_SITE_OTHER): Payer: PPO | Admitting: Family Medicine

## 2022-01-14 ENCOUNTER — Encounter: Payer: Self-pay | Admitting: Family Medicine

## 2022-01-14 VITALS — BP 133/64 | HR 63 | Resp 15 | Ht 62.5 in | Wt 157.1 lb

## 2022-01-14 DIAGNOSIS — I152 Hypertension secondary to endocrine disorders: Secondary | ICD-10-CM | POA: Diagnosis not present

## 2022-01-14 DIAGNOSIS — E1129 Type 2 diabetes mellitus with other diabetic kidney complication: Secondary | ICD-10-CM | POA: Diagnosis not present

## 2022-01-14 DIAGNOSIS — E1159 Type 2 diabetes mellitus with other circulatory complications: Secondary | ICD-10-CM

## 2022-01-14 DIAGNOSIS — E1169 Type 2 diabetes mellitus with other specified complication: Secondary | ICD-10-CM

## 2022-01-14 DIAGNOSIS — Z0000 Encounter for general adult medical examination without abnormal findings: Secondary | ICD-10-CM

## 2022-01-14 DIAGNOSIS — Z23 Encounter for immunization: Secondary | ICD-10-CM

## 2022-01-14 DIAGNOSIS — M25561 Pain in right knee: Secondary | ICD-10-CM | POA: Diagnosis not present

## 2022-01-14 DIAGNOSIS — F1020 Alcohol dependence, uncomplicated: Secondary | ICD-10-CM

## 2022-01-14 DIAGNOSIS — E039 Hypothyroidism, unspecified: Secondary | ICD-10-CM

## 2022-01-14 DIAGNOSIS — G8929 Other chronic pain: Secondary | ICD-10-CM

## 2022-01-14 DIAGNOSIS — E785 Hyperlipidemia, unspecified: Secondary | ICD-10-CM | POA: Diagnosis not present

## 2022-01-14 DIAGNOSIS — F17200 Nicotine dependence, unspecified, uncomplicated: Secondary | ICD-10-CM | POA: Diagnosis not present

## 2022-01-14 MED ORDER — DILTIAZEM HCL ER 120 MG PO CP12: 120.0000 mg | ORAL_CAPSULE | Freq: Two times a day (BID) | ORAL | 0 refills | Status: DC

## 2022-01-14 NOTE — Assessment & Plan Note (Signed)
Failed chantix d/t bad dreams Refused referral to [facility] smoking cessation Advised use of 21 mg patch and 1 pack smoking- pt refused to consider use Will reapproach at later date Advised that it is never too late to quit

## 2022-01-14 NOTE — Assessment & Plan Note (Signed)
Repeat lipid panel today Goal of LDL <70

## 2022-01-14 NOTE — Assessment & Plan Note (Signed)
Chronic, stable on home readings Denies CP Denies SOB Denies DOE No LE Edema noted on exam Continue medication at BID dosing Refills stable Seek emergent care if you develop CP, chest pain or chest pressure

## 2022-01-14 NOTE — Progress Notes (Addendum)
Complete Physical Exam Visit     Patient: Heather Burke, Female    DOB: 07-Nov-1951, 71 y.o.   MRN: [id number] Visit Date: 01/14/2022  Today's Provider: Gwyneth Sprout, FNP   Chief Complaint  Patient presents with   Annual Exam   Subjective      HPI  Heather Burke is a 71 y.o. female who presents today for her complete physical exam .  She reports consuming a Optivia diet. Home exercise routine includes walking 4mn 4-5x a week. She generally feels fairly well. She reports sleeping well. She does have additional problems to discuss today, patient has concerns about continuing on blood pressure medication.   Discussion provided, in follow-up to previous conversations with PharmD on site, advised no problems continuing medication, but advised that it not a first line medication for blood pressure which is why the pharmacist at pick up may have asked question about use.  Patient reports that she checks blood pressure readings at home and has a systolic ranging from 1277-412and diastolic range 587-86  Medications: Outpatient Medications Prior to Visit  Medication Sig   aspirin 81 MG tablet Take 81 mg by mouth daily.   Bisacodyl (GENTLE LAXATIVE PO) Take by mouth.   calcium carbonate (OS-CAL) 600 MG TABS tablet Take 1 tablet (600 mg total) by mouth 2 (two) times daily with a meal.   CANNABIDIOL PO Take 500 mg by mouth daily.   Cholecalciferol (D3 ADULT PO) Take 1 tablet by mouth daily. 1000 units daily   Collagen-Vitamin C-Biotin (COLLAGEN 1500/C PO) Take by mouth.   Dulaglutide (TRULICITY) 1.5 MVE/7.2CNSOPN Inject 1.5 mg into the skin once a week. [employer] assistance through Dec 2023   FIBER PO Take by mouth 2 (two) times daily.   fluticasone (FLONASE) 50 MCG/ACT nasal spray SPRAY 2 SPRAYS INTO EACH NOSTRIL EVERY DAY   levothyroxine (SYNTHROID) 88 MCG tablet Take 1 tablet (88 mcg total) by mouth daily.   losartan-hydrochlorothiazide (HYZAAR) 100-25 MG  tablet Take 1 tablet by mouth daily.   lovastatin (MEVACOR) 20 MG tablet Take 1 tablet (20 mg total) by mouth at bedtime.   metoprolol (TOPROL-XL) 200 MG 24 hr tablet Take 1 tablet (200 mg total) by mouth daily.   Misc Natural Products (T-RELIEF CBD+13 SL) Place 500 mg under the tongue daily.   Nutritional Supplements (QUINOA/KALE/HEMP PO) Place 750 mg under the tongue.   Omega-3 Fatty Acids (FISH OIL) 1200 MG CAPS Take by mouth daily. 2400 qam 1200 mg qpm   potassium chloride (KLOR-CON M10) 10 MEQ tablet Take 1 tablet (10 mEq total) by mouth daily.   [DISCONTINUED] diltiazem (CARDIZEM SR) 120 MG 12 hr capsule TAKE 1 CAPSULE (120MG) IN THE MORNING AND 2 CAPSULES (240MG) IN THE EVENING   [DISCONTINUED] Blood Glucose Monitoring Suppl (ONETOUCH VERIO) w/Device KIT To check blood sugar daily   [DISCONTINUED] glucose blood (ONETOUCH VERIO) test strip To check blood sugar daily   [DISCONTINUED] Lancets (ONETOUCH ULTRASOFT) lancets To check blood sugar daily   No facility-administered medications prior to visit.    Allergies  Allergen Reactions   Amlodipine Nausea Only and Other (See Comments)    Stomach cramping   Atorvastatin Nausea And Vomiting   Lisinopril Cough   Pravastatin Nausea And Vomiting   Nifedipine Er Other (See Comments)    headache    Patient Care Team: PGwyneth Sprout FNP as PCP - General (Family Medicine) BLorie Apley  T., MD as Consulting Physician (Ophthalmology) Germaine Pomfret, [facility] as Pharmacist (Pharmacist)  Review of Systems  All other systems reviewed and are negative.      Objective    Vitals: BP 133/64 Comment: last home reading 1/24   Pulse 63    Resp 15    Ht 5' 2.5" (1.588 m)    Wt 157 lb 1.6 oz (71.3 kg)    SpO2 100%    BMI 28.28 kg/m    Physical Exam Vitals and nursing note reviewed.  Constitutional:      General: She is awake. She is not in acute distress.    Appearance: Normal appearance. She is well-developed, well-groomed and overweight.  She is not ill-appearing, toxic-appearing or diaphoretic.  HENT:     Head: Normocephalic and atraumatic.     Jaw: There is normal jaw occlusion. No trismus, tenderness, swelling or pain on movement.     Right Ear: Hearing, tympanic membrane, ear canal and external ear normal. There is impacted cerumen.     Left Ear: Hearing, tympanic membrane, ear canal and external ear normal. There is impacted cerumen.     Nose: Nose normal. No congestion or rhinorrhea.     Right Turbinates: Not enlarged, swollen or pale.     Left Turbinates: Not enlarged, swollen or pale.     Right Sinus: No maxillary sinus tenderness or frontal sinus tenderness.     Left Sinus: No maxillary sinus tenderness or frontal sinus tenderness.     Mouth/Throat:     Lips: Pink.     Mouth: Mucous membranes are moist. No injury.     Tongue: No lesions.     Pharynx: Oropharynx is clear. Uvula midline. No pharyngeal swelling, oropharyngeal exudate, posterior oropharyngeal erythema or uvula swelling.     Tonsils: No tonsillar exudate or tonsillar abscesses.  Eyes:     General: Lids are normal. Lids are everted, no foreign bodies appreciated. Vision grossly intact. Gaze aligned appropriately. No allergic shiner or visual field deficit.       Right eye: No discharge.        Left eye: No discharge.     Extraocular Movements: Extraocular movements intact.     Conjunctiva/sclera: Conjunctivae normal.     Right eye: Right conjunctiva is not injected. No exudate.    Left eye: Left conjunctiva is not injected. No exudate.    Pupils: Pupils are equal, round, and reactive to light.  Neck:     Thyroid: No thyroid mass, thyromegaly or thyroid tenderness.     Vascular: No carotid bruit.     Trachea: Trachea normal.  Cardiovascular:     Rate and Rhythm: Normal rate and regular rhythm.     Pulses: Normal pulses.          Carotid pulses are 2+ on the right side and 2+ on the left side.      Radial pulses are 2+ on the right side and 2+ on  the left side.       Dorsalis pedis pulses are 2+ on the right side and 2+ on the left side.       Posterior tibial pulses are 2+ on the right side and 2+ on the left side.     Heart sounds: Normal heart sounds, S1 normal and S2 normal. No murmur heard.   No friction rub. No gallop.  Pulmonary:     Effort: Pulmonary effort is normal. No respiratory distress.     Breath sounds: Normal breath  sounds and air entry. No stridor. No wheezing, rhonchi or rales.  Chest:     Chest wall: No tenderness.     Comments: Breast exam deferred; discussed self exam Abdominal:     General: Abdomen is flat. Bowel sounds are normal. There is no distension.     Palpations: Abdomen is soft. There is no mass.     Tenderness: There is no abdominal tenderness. There is no right CVA tenderness, left CVA tenderness, guarding or rebound.     Hernia: No hernia is present.  Genitourinary:    Comments: Exam deferred; denies complaints Musculoskeletal:        General: No swelling, tenderness, deformity or signs of injury. Normal range of motion.     Cervical back: Full passive range of motion without pain, normal range of motion and neck supple. No edema, rigidity or tenderness. No muscular tenderness.     Right lower leg: No edema.     Left lower leg: No edema.  Lymphadenopathy:     Cervical: No cervical adenopathy.     Right cervical: No superficial, deep or posterior cervical adenopathy.    Left cervical: No superficial, deep or posterior cervical adenopathy.  Skin:    General: Skin is warm and dry.     Capillary Refill: Capillary refill takes less than 2 seconds.     Coloration: Skin is not jaundiced or pale.     Findings: No bruising, erythema, lesion or rash.  Neurological:     General: No focal deficit present.     Mental Status: She is alert and oriented to person, place, and time. Mental status is at baseline.     GCS: GCS eye subscore is 4. GCS verbal subscore is 5. GCS motor subscore is 6.     Sensory:  Sensation is intact. No sensory deficit.     Motor: Motor function is intact. No weakness.     Coordination: Coordination is intact. Coordination normal.     Gait: Gait is intact. Gait normal.  Psychiatric:        Attention and Perception: Attention and perception normal.        Mood and Affect: Mood and affect normal.        Speech: Speech normal.        Behavior: Behavior normal. Behavior is cooperative.        Thought Content: Thought content normal.        Cognition and Memory: Cognition and memory normal.        Judgment: Judgment normal.    Most recent functional status assessment: In your present state of health, do you have any difficulty performing the following activities: 01/14/2022  Hearing? N  Vision? N  Difficulty concentrating or making decisions? N  Walking or climbing stairs? N  Dressing or bathing? N  Doing errands, shopping? N  Preparing Food and eating ? -  Using the Toilet? -  In the past six months, have you accidently leaked urine? -  Do you have problems with loss of bowel control? -  Managing your Medications? -  Managing your Finances? -  Housekeeping or managing your Housekeeping? -  Some recent data might be hidden   Most recent fall risk assessment: Fall Risk  12/16/2021  Falls in the past year? 0  Comment -  Number falls in past yr: 0  Comment -  Injury with Fall? 0  Risk for fall due to : No Fall Risks  Follow up Falls evaluation completed  Most recent depression screenings: PHQ 2/9 Scores 12/16/2021 03/15/2021  PHQ - 2 Score 0 0  PHQ- 9 Score - 0   Most recent cognitive screening: 6CIT Screen 12/04/2019  What Year? 0 points  What month? 0 points  What time? 0 points  Count back from 20 0 points  Months in reverse 0 points  Repeat phrase 2 points  Total Score 2   Most recent Audit-C alcohol use screening Alcohol Use Disorder Test (AUDIT) 01/14/2022  1. How often do you have a drink containing alcohol? 4  2. How many drinks  containing alcohol do you have on a typical day when you are drinking? 0  3. How often do you have six or more drinks on one occasion? 0  AUDIT-C Score 4  4. How often during the last year have you found that you were not able to stop drinking once you had started? -  5. How often during the last year have you failed to do what was normally expected from you because of drinking? -  6. How often during the last year have you needed a first drink in the morning to get yourself going after a heavy drinking session? -  7. How often during the last year have you had a feeling of guilt of remorse after drinking? -  8. How often during the last year have you been unable to remember what happened the night before because you had been drinking? -  9. Have you or someone else been injured as a result of your drinking? -  10. Has a relative or friend or a doctor or another health worker been concerned about your drinking or suggested you cut down? -  Alcohol Use Disorder Identification Test Final Score (AUDIT) -  Alcohol Brief Interventions/Follow-up -   A score of 3 or more in women, and 4 or more in men indicates increased risk for alcohol abuse, EXCEPT if all of the points are from question 1   No results found for any visits on 01/14/22.  Assessment & Plan     Complete Physical exam visit done today including the all of the following: Reviewed patient's Family Medical History Reviewed and updated list of patient's medical providers Assessment of cognitive impairment was done Assessed patient's functional ability Established a written schedule for health screening [place] Completed and Reviewed  Exercise Activities and Dietary recommendations  Goals      Quit Smoking     Recommend to continue efforts to reduce smoking habits until no longer smoking.      Reduce alcohol intake     Recommend to continue to cut back on alcohol consumption in diet. Recommend no more than  1 drink a day.     Reduce sugar intake      Recommend decreasing junk food as snacks and increasing more healthy snacks.      Track and Manage My Blood Pressure-Hypertension     Timeframe:  Long-Range Goal Priority:  High Start Date: 08/10/2021                             Expected End Date:  08/10/2022                     Follow Up within 90 days   - check blood pressure weekly - write blood pressure results in a log or diary    Why is this important?  You won't feel high blood pressure, but it can still hurt your blood vessels.  High blood pressure can cause heart or kidney problems. It can also cause a stroke.  Making lifestyle changes like losing a little weight or eating less salt will help.  Checking your blood pressure at home and at different times of the day can help to control blood pressure.  If the doctor prescribes medicine remember to take it the way the doctor ordered.  Call the office if you cannot afford the medicine or if there are questions about it.     Notes:         Immunization History  Administered Date(s) Administered   Fluad Quad(high Dose 65+) 10/15/2020   Influenza Split 10/21/2010, 10/13/2012   Influenza, High Dose Seasonal PF 10/04/2018, 09/28/2021   Influenza,inj,Quad PF,6+ Mos 10/02/2014, 09/09/2015   Influenza-Unspecified 09/21/2016, 10/03/2017, 09/20/2019   PFIZER Comirnaty(Gray Top)Covid-19 Tri-Sucrose Vaccine 07/13/2021   PFIZER(Purple Top)SARS-COV-2 Vaccination 02/18/2020, 02/23/2020, 09/10/2020   Pfizer Covid-19 Vaccine Bivalent Booster 76yr & up 12/28/2021   Pneumococcal Conjugate-13 07/21/2017   Pneumococcal Polysaccharide-23 01/22/2013, 08/23/2018   Tdap 10/21/2010   Zoster Recombinat (Shingrix) 04/27/2020, 01/27/2021   Zoster, Live 10/03/2013    Health Maintenance  Topic Date Due   TETANUS/TDAP  10/21/2020   HEMOGLOBIN A1C  12/15/2021   OPHTHALMOLOGY EXAM  01/12/2022   FOOT EXAM  06/15/2022   MAMMOGRAM  03/13/2023    COLONOSCOPY (Pts 45-456yrInsurance coverage will need to be confirmed)  09/13/2026   Pneumonia Vaccine 6522Years old  Completed   INFLUENZA VACCINE  Completed   DEXA SCAN  Completed   COVID-19 Vaccine  Completed   Hepatitis C Screening  Completed   Zoster Vaccines- Shingrix  Completed   HPV VACCINES  Aged Out     Discussed health benefits of physical activity, and encouraged her to engage in regular exercise appropriate for her age and condition.    Problem List Items Addressed This Visit       Cardiovascular and Mediastinum   Hypertension associated with type 2 diabetes mellitus ([place]   Chronic, stable on home readings Denies CP Denies SOB Denies DOE No LE Edema noted on exam Continue medication at BID dosing Refills stable Seek emergent care if you develop CP, chest pain or chest pressure       Relevant Medications   diltiazem (CARDIZEM SR) 120 MG 12 hr capsule   Other Relevant Orders   Comprehensive metabolic panel     Endocrine   Type 2 diabetes mellitus with other diabetic kidney complication (HCC)    Stable use of medication with assistance of [employer] assistance plan Repeat A1c Continue to recommend balanced, lower carb meals. Smaller meal size, adding snacks. Choosing water as drink of choice and increasing purposeful exercise.       Relevant Orders   Hemoglobin A1c   Hypothyroidism    Reports cold intolerance Repeat labs Denies missed doses      Relevant Orders   TSH + free T4   Hyperlipidemia associated with type 2 diabetes mellitus (HCC)    Repeat lipid panel today Goal of LDL <70      Relevant Medications   diltiazem (CARDIZEM SR) 120 MG 12 hr capsule   Other Relevant Orders   Lipid panel     Other   Compulsive tobacco user syndrome    Failed chantix d/t bad dreams Refused referral to [facility] smoking cessation Advised use of 21 mg patch and 1 pack smoking- pt  refused to consider use Will reapproach at later date Advised that it is never too  late to quit      Chronic pain of right knee    Hx of torn ligament Use of electric scooter when needed      Need for diphtheria-tetanus-pertussis (Tdap) vaccine    provided      Alcohol dependence, daily use ([place])    Reports 3-4 beers daily Does not wish to reduce Education provided      Annual physical exam - Primary    UTD on dental UTD on eye Things to do to keep yourself healthy  - Exercise at least 30-45 minutes a day, 3-4 days a week.  - Eat a low-fat diet with lots of fruits and vegetables, up to 7-9 servings per day.  - Seatbelts can save your life. Wear them always.  - Smoke detectors on every level of your home, check batteries every year.  - Eye Doctor - have an eye exam every 1-2 years  - Safe sex - if you may be exposed to STDs, use a condom.  - Alcohol -  If you drink, do it moderately, less than 2 drinks per day.  - [place]. Choose someone to speak for you if you are not able.  - Depression is common in our stressful world.If you're feeling down or losing interest in things you normally enjoy, please come in for a visit.  - Violence - If anyone is threatening or hurting you, please call immediately.          Return in about 6 months (around 07/14/2022) for chonic disease management.     Vonna Kotyk, FNP, have reviewed all documentation for this visit. The documentation on 01/14/22 for the exam, diagnosis, procedures, and orders are all accurate and complete.  Patient seen and examined by [name],  FNP note scribed by [employer], [place], [place] [phone] (phone) [phone] (fax)  Grandfather

## 2022-01-14 NOTE — Assessment & Plan Note (Signed)
Stable use of medication with assistance of [employer] assistance plan Repeat A1c Continue to recommend balanced, lower carb meals. Smaller meal size, adding snacks. Choosing water as drink of choice and increasing purposeful exercise.

## 2022-01-14 NOTE — Assessment & Plan Note (Signed)
Reports cold intolerance Repeat labs Denies missed doses

## 2022-01-14 NOTE — Assessment & Plan Note (Signed)
Due for TDAP

## 2022-01-14 NOTE — Assessment & Plan Note (Signed)
provided ?

## 2022-01-14 NOTE — Assessment & Plan Note (Signed)
Reports 3-4 beers daily Does not wish to reduce Education provided

## 2022-01-14 NOTE — Assessment & Plan Note (Signed)
Hx of torn ligament Use of electric scooter when needed

## 2022-01-14 NOTE — Assessment & Plan Note (Signed)
UTD on dental UTD on eye Things to do to keep yourself healthy  - Exercise at least 30-45 minutes a day, 3-4 days a week.  - Eat a low-fat diet with lots of fruits and vegetables, up to 7-9 servings per day.  - Seatbelts can save your life. Wear them always.  - Smoke detectors on every level of your home, check batteries every year.  - Eye Doctor - have an eye exam every 1-2 years  - Safe sex - if you may be exposed to STDs, use a condom.  - Alcohol -  If you drink, do it moderately, less than 2 drinks per day.  - [place]. Choose someone to speak for you if you are not able.  - Depression is common in our stressful world.If you're feeling down or losing interest in things you normally enjoy, please come in for a visit.  - Violence - If anyone is threatening or hurting you, please call immediately.

## 2022-01-15 LAB — HEMOGLOBIN A1C
Est. average glucose Bld gHb Est-mCnc: 128 mg/dL
Hgb A1c MFr Bld: 6.1 % — ABNORMAL HIGH (ref 4.8–5.6)

## 2022-01-15 LAB — TSH+FREE T4
Free T4: 1.45 ng/dL (ref 0.82–1.77)
TSH: 1.44 u[IU]/mL (ref 0.450–4.500)

## 2022-01-15 LAB — COMPREHENSIVE METABOLIC PANEL
ALT: 31 IU/L (ref 0–32)
AST: 28 IU/L (ref 0–40)
Albumin/Globulin Ratio: 1.4 (ref 1.2–2.2)
Albumin: 4.5 g/dL (ref 3.8–4.8)
Alkaline Phosphatase: 74 IU/L (ref 44–121)
BUN/Creatinine Ratio: 26 (ref 12–28)
BUN: 19 mg/dL (ref 8–27)
Bilirubin Total: 0.3 mg/dL (ref 0.0–1.2)
CO2: 27 mmol/L (ref 20–29)
Calcium: 10.1 mg/dL (ref 8.7–10.3)
Chloride: 99 mmol/L (ref 96–106)
Creatinine, Ser: 0.73 mg/dL (ref 0.57–1.00)
Globulin, Total: 3.3 g/dL (ref 1.5–4.5)
Glucose: 91 mg/dL (ref 70–99)
Potassium: 4.8 mmol/L (ref 3.5–5.2)
Sodium: 141 mmol/L (ref 134–144)
Total Protein: 7.8 g/dL (ref 6.0–8.5)
eGFR: 89 mL/min/{1.73_m2} (ref >59)

## 2022-01-15 LAB — LIPID PANEL
Chol/HDL Ratio: 4.4 ratio (ref 0.0–4.4)
Cholesterol, Total: 161 mg/dL (ref 100–199)
HDL: 37 mg/dL — ABNORMAL LOW (ref >39)
LDL Chol Calc (NIH): 81 mg/dL (ref 0–99)
Triglycerides: 260 mg/dL — ABNORMAL HIGH (ref 0–149)
VLDL Cholesterol Cal: 43 mg/dL — ABNORMAL HIGH (ref 5–40)

## 2022-02-01 ENCOUNTER — Telehealth: Payer: Self-pay

## 2022-02-01 NOTE — Progress Notes (Signed)
Chronic Care Management Pharmacy Assistant   Name: Heather Burke  MRN: [id number] DOB: 1951-11-07  Reason for Encounter: Hypertension Disease State Call   Recent office visits:  01/14/2022 Heather Joe, FNP (PCP Office Visit) for Annual Physical Exam-Changed: Diltiazem HCl 120 mg from 3 pills daily to 1 pill twice daily, Lab orders placed, patient to follow-up in 6 months  12/16/2021 Heather Shaggy, LPN (PCP Clinical Support) for Medicare Wellness Exam- No medication changes noted, no orders placed, no follow-up noted  Recent consult visits:  None ID  Hospital visits:  None in previous 6 months  Medications: Outpatient Encounter Medications as of 02/01/2022  Medication Sig   aspirin 81 MG tablet Take 81 mg by mouth daily.   Bisacodyl (GENTLE LAXATIVE PO) Take by mouth.   calcium carbonate (OS-CAL) 600 MG TABS tablet Take 1 tablet (600 mg total) by mouth 2 (two) times daily with a meal.   CANNABIDIOL PO Take 500 mg by mouth daily.   Cholecalciferol (D3 ADULT PO) Take 1 tablet by mouth daily. 1000 units daily   Collagen-Vitamin C-Biotin (COLLAGEN 1500/C PO) Take by mouth.   diltiazem (CARDIZEM SR) 120 MG 12 hr capsule Take 1 capsule (120 mg total) by mouth 2 (two) times daily.   Dulaglutide (TRULICITY) 1.5 WU/9.8JX SOPN Inject 1.5 mg into the skin once a week. [name] patient assistance through Dec 2023   FIBER PO Take by mouth 2 (two) times daily.   fluticasone (FLONASE) 50 MCG/ACT nasal spray SPRAY 2 SPRAYS INTO EACH NOSTRIL EVERY DAY   levothyroxine (SYNTHROID) 88 MCG tablet Take 1 tablet (88 mcg total) by mouth daily.   losartan-hydrochlorothiazide (HYZAAR) 100-25 MG tablet Take 1 tablet by mouth daily.   lovastatin (MEVACOR) 20 MG tablet Take 1 tablet (20 mg total) by mouth at bedtime.   metoprolol (TOPROL-XL) 200 MG 24 hr tablet Take 1 tablet (200 mg total) by mouth daily.   Misc Natural Products (T-RELIEF CBD+13 SL) Place 500 mg under the tongue daily.    Nutritional Supplements (QUINOA/KALE/HEMP PO) Place 750 mg under the tongue.   Omega-3 Fatty Acids (FISH OIL) 1200 MG CAPS Take by mouth daily. 2400 qam 1200 mg qpm   potassium chloride (KLOR-CON M10) 10 MEQ tablet Take 1 tablet (10 mEq total) by mouth daily.   No facility-administered encounter medications on file as of 02/01/2022.   Care Gaps: Tetanus/TDAP Diabetic Eye Exam  Star Rating Drugs: Trulicity 1.5 mg  patient receives this medication through [employer] patient assistance program Lovastatin 20 mg last filled on 12/01/2021 for a 90-Day supply with [facility] Losartan-HCTZ 100-25 mg last filled on 12/01/2021 for a 90-Day supply with [facility]  Reviewed chart prior to disease state call. Spoke with patient regarding BP  Recent Office Vitals: BP Readings from Last 3 Encounters:  01/14/22 133/64  06/15/21 (!) 113/56  03/15/21 (!) 165/82   Pulse Readings from Last 3 Encounters:  01/14/22 63  06/15/21 80  03/15/21 62    Wt Readings from Last 3 Encounters:  01/14/22 157 lb 1.6 oz (71.3 kg)  06/15/21 161 lb (73 kg)  03/15/21 173 lb (78.5 kg)     Kidney Function Lab Results  Component Value Date/Time   CREATININE 0.73 01/14/2022 10:32 AM   CREATININE 0.68 06/17/2021 01:38 PM   GFRNONAA 60 12/04/2019 10:11 AM   GFRAA 69 12/04/2019 10:11 AM    BMP Latest Ref Rng & Units 01/14/2022 06/17/2021 12/04/2019  Glucose 70 - 99 mg/dL  91 93 150(H)  BUN 8 - 27 mg/dL 19 18 12   Creatinine 0.57 - 1.00 mg/dL 0.73 0.68 0.98  BUN/Creat Ratio 12 - 28 26 26 12   Sodium 134 - 144 mmol/L 141 138 138  Potassium 3.5 - 5.2 mmol/L 4.8 4.1 4.0  Chloride 96 - 106 mmol/L 99 96 96  CO2 20 - 29 mmol/L 27 26 22   Calcium 8.7 - 10.3 mg/dL 10.1 10.0 10.2    Current antihypertensive regimen:  Diltiazem SR 120 mg 1 capsule twice daily Losartan-HCTZ 100-25 mg daily  Metoprolol XL 200 mg daily   How often are you checking your Blood Pressure? 1-2x per week  Current home BP  readings: Patient wasn't home but reports her home numbers usually run between 491-791 systolic and 50-56 diastolic. She stated she had a doctors appointment last week and it was elevated but she stated it's because she was in the providers office.   What recent interventions/DTPs have been made by any provider to improve Blood Pressure control since last CPP Visit: Yes patient's Diltiazem was changed to twice daily  Any recent hospitalizations or ED visits since last visit with CPP? No  What diet changes have been made to improve Blood Pressure Control?  She is on an [place]  What exercise is being done to improve your Blood Pressure Control?  She walks pretty much daily at least 5 times a week  Adherence Review: Is the patient currently on ACE/ARB medication? Yes Does the patient have >5 day gap between last estimated fill dates? No  Patient reports that she is doing well. Patient denies any ill symptoms at this time. She is taking all medications as directed. Patient denies needing any refills today. Patient has no concerns or issues at this time.   Patient has a telephone appointment with [name], CPP on 02/21/2022 @ [place], CPA/CMA Catering manager Phone: [phone]

## 2022-02-09 NOTE — Addendum Note (Signed)
Addended by: Minette Headland on: 02/09/2022 02:33 PM   Modules accepted: Orders

## 2022-02-18 ENCOUNTER — Telehealth: Payer: Self-pay

## 2022-02-18 NOTE — Progress Notes (Signed)
? ? ?  Chronic Care Management ?Pharmacy Assistant  ? ?Name: Heather Burke  MRN: [id number] DOB: July 30, 1951 ? ?Patient called to be reminded of her telephone appointment with [name], CPP on 02/21/2022 @ 1100 ? ?Patient aware of appointment date, time, and type of appointment (either telephone or in person). Patient aware to have/bring all medications, supplements, blood pressure and/or blood sugar logs to visit. ? ?Questions: ?Are there any concerns you would like to discuss during your office visit? Nothing at this time ? ?Are you having any problems obtaining your medications? No issues ? ?If patient has any PAP medications ask if they are having any problems getting their PAP medication or refill? Patient reports she has about a 6 months supply of her Trulicity so she has no issues ? ?Star Rating Drug: ?Trulicity 1.5 mg patient on patient assistance for this medication with [employer] ?Lovastatin 20 mg last filled on 12/01/2021 for a 90-Day supply with [facility] ?Losartan-HCTZ 100-25 last filled on 12/01/2021 for a 90-Day supply with [facility] ? ?Any gaps in medications fill history? None ? ?Care Gaps: ?Diabetic Eye Exam ? ? ?Lynann Bologna, CPA/CMA ?Clinical Pharmacist Assistant ?Phone: [phone]  ? ?

## 2022-02-21 ENCOUNTER — Ambulatory Visit (INDEPENDENT_AMBULATORY_CARE_PROVIDER_SITE_OTHER): Payer: PPO

## 2022-02-21 DIAGNOSIS — E1159 Type 2 diabetes mellitus with other circulatory complications: Secondary | ICD-10-CM

## 2022-02-21 DIAGNOSIS — F17200 Nicotine dependence, unspecified, uncomplicated: Secondary | ICD-10-CM

## 2022-02-21 NOTE — Progress Notes (Incomplete)
Chronic Care Management Pharmacy Note  02/21/2022 Name:  Heather Burke MRN:  [id number] DOB:  [birth year]  Summary: Patient presents for CCM follow-up. Some elevated blood pressures, but patient home readings are borderline at goal. Will continue to monitor  Recommendations/Changes made from today's visit: -Continue current medications  Plan: CPP Follow-up 3 months  Subjective: Heather Burke is an 71 y.o. year old female who is a primary patient of Gwyneth Sprout, FNP.  The CCM team was consulted for assistance with disease management and care coordination needs.    Engaged with patient by telephone for follow up visit in response to provider referral for pharmacy case management and/or care coordination services.   Consent to Services:  The patient was given information about Chronic Care Management services, agreed to services, and gave verbal consent prior to initiation of services.  Please see initial visit note for detailed documentation.   Patient Care Team: Gwyneth Sprout, FNP as PCP - General (Family Medicine) Lorelee Cover., MD as Consulting Physician (Ophthalmology) Germaine Pomfret, [facility] as Pharmacist (Pharmacist)  Recent office visits: 01/14/22: Patient presented to [name], FNP for follow-up. Diltiazem 120 mg twice daily   Recent consult visits: None in previous 6 months  Hospital visits: None in previous 6 months   Objective:  Lab Results  Component Value Date   CREATININE 0.73 01/14/2022   BUN 19 01/14/2022   GFRNONAA 60 12/04/2019   GFRAA 69 12/04/2019   NA 141 01/14/2022   K 4.8 01/14/2022   CALCIUM 10.1 01/14/2022   CO2 27 01/14/2022   GLUCOSE 91 01/14/2022    Lab Results  Component Value Date/Time   HGBA1C 6.1 (H) 01/14/2022 10:32 AM   HGBA1C 6.1 (A) 06/15/2021 11:18 AM   HGBA1C 6.5 (A) 03/15/2021 10:35 AM   HGBA1C 6.9 (H) 12/04/2019 10:11 AM   MICROALBUR Negative 09/29/2015 11:26 AM    Last diabetic Eye exam:  Lab Results   Component Value Date/Time   HMDIABEYEEXA No Retinopathy 01/12/2021 12:00 AM    Last diabetic Foot exam: No results found for: HMDIABFOOTEX   Lab Results  Component Value Date   CHOL 161 01/14/2022   HDL 37 (L) 01/14/2022   LDLCALC 81 01/14/2022   TRIG 260 (H) 01/14/2022   CHOLHDL 4.4 01/14/2022    Hepatic Function Latest Ref Rng & Units 01/14/2022 06/17/2021 12/04/2019  Total Protein 6.0 - 8.5 g/dL 7.8 7.5 7.6  Albumin 3.8 - 4.8 g/dL 4.5 4.1 4.3  AST 0 - 40 IU/L 28 26 55(H)  ALT 0 - 32 IU/L 31 34(H) 43(H)  Alk Phosphatase 44 - 121 IU/L 74 76 64  Total Bilirubin 0.0 - 1.2 mg/dL 0.3 0.4 0.5    Lab Results  Component Value Date/Time   TSH 1.440 01/14/2022 10:32 AM   TSH 2.060 06/17/2021 01:38 PM   FREET4 1.45 01/14/2022 10:32 AM    CBC Latest Ref Rng & Units 12/04/2019 09/18/2018 01/16/2018  WBC 3.4 - 10.8 x10E3/uL 7.0 6.6 7.4  Hemoglobin 11.1 - 15.9 g/dL 15.1 15.2 14.6  Hematocrit 34.0 - 46.6 % 43.8 43.5 41.9  Platelets 150 - 450 x10E3/uL 237 241 217    Lab Results  Component Value Date/Time   VD25OH 48.2 09/18/2018 09:12 AM   VD25OH 52.7 04/20/2016 08:04 AM    Clinical ASCVD: No  The 10-year ASCVD risk score (Arnett DK, et al., 2019) is: 35.2%   Values used to calculate the score:     Age: 26 years  Sex: Female     Is Non-Hispanic African American: No     Diabetic: Yes     Tobacco smoker: Yes     Systolic Blood Pressure: 616 mmHg     Is BP treated: Yes     HDL Cholesterol: 37 mg/dL     Total Cholesterol: 161 mg/dL    Depression screen [facility] 2/9 12/16/2021 03/15/2021 12/14/2020  Decreased Interest 0 0 0  Down, Depressed, Hopeless 0 0 0  PHQ - 2 Score 0 0 0  Altered sleeping - 0 -  Tired, decreased energy - 0 -  Change in appetite - 0 -  Feeling bad or failure about yourself  - 0 -  Trouble concentrating - 0 -  Moving slowly or fidgety/restless - 0 -  Suicidal thoughts - 0 -  PHQ-9 Score - 0 -  Difficult doing work/chores - Not difficult at all -      Social History   Tobacco Use  Smoking Status Every Day   Packs/day: 2.00   Years: 49.00   Pack years: 98.00   Types: Cigarettes  Smokeless Tobacco Never   BP Readings from Last 3 Encounters:  01/14/22 133/64  06/15/21 (!) 113/56  03/15/21 (!) 165/82   Pulse Readings from Last 3 Encounters:  01/14/22 63  06/15/21 80  03/15/21 62   Wt Readings from Last 3 Encounters:  01/14/22 157 lb 1.6 oz (71.3 kg)  06/15/21 161 lb (73 kg)  03/15/21 173 lb (78.5 kg)   BMI Readings from Last 3 Encounters:  01/14/22 28.28 kg/m  06/15/21 29.45 kg/m  03/15/21 30.65 kg/m    Assessment/Interventions: Review of patient past medical history, allergies, medications, health status, including review of consultants reports, laboratory and other test data, was performed as part of comprehensive evaluation and provision of chronic care management services.   SDOH:  (Social Determinants of Health) assessments and interventions performed: Yes    SDOH Screenings   Alcohol Screen: Low Risk    Last Alcohol Screening Score (AUDIT): 4  Depression (PHQ2-9): Low Risk    PHQ-2 Score: 0  Financial Resource Strain: Low Risk    Difficulty of Paying Living Expenses: Not hard at all  Food Insecurity: No Food Insecurity   Worried About Charity fundraiser in the Last Year: Never true   Ran Out of Food in the Last Year: Never true  Housing: Low Risk    Last Housing Risk Score: 0  Physical Activity: Inactive   Days of Exercise per Week: 0 days   Minutes of Exercise per Session: 0 min  Social Connections: Socially Isolated   Frequency of Communication with Friends and Family: More than three times a week   Frequency of Social Gatherings with Friends and Family: More than three times a week   Attends Religious Services: Never   Marine scientist or Organizations: No   Attends Archivist Meetings: Never   Marital Status: Widowed  Stress: No Stress Concern Present   Feeling of Stress :  Not at all  Tobacco Use: High Risk   Smoking Tobacco Use: Every Day   Smokeless Tobacco Use: Never   Passive Exposure: Not on file  Transportation Needs: No Transportation Needs   Lack of Transportation (Medical): No   Lack of Transportation (Non-Medical): No    CCM Care Plan  Allergies  Allergen Reactions   Amlodipine Nausea Only and Other (See Comments)    Stomach cramping   Atorvastatin Nausea And Vomiting  Lisinopril Cough   Pravastatin Nausea And Vomiting   Nifedipine Er Other (See Comments)    headache    Medications Reviewed Today     Reviewed by Gwyneth Sprout, FNP (Family Nurse Practitioner) on 01/14/22 at [phone]  Med List Status: <None>   Medication Order Taking? Sig Documenting Provider Last Dose Status Informant  aspirin 81 MG tablet [id number] Yes Take 81 mg by mouth daily. [provider] Taking Active   Bisacodyl (GENTLE LAXATIVE PO) [id number] Yes Take by mouth. [provider] Taking Active   calcium carbonate (OS-CAL) 600 MG TABS tablet [id number] Yes Take 1 tablet (600 mg total) by mouth 2 (two) times daily with a meal. Forest Gleason, MD Taking Active   CANNABIDIOL PO [id number] Yes Take 500 mg by mouth daily. [provider] Taking Active   Cholecalciferol (D3 ADULT PO) [id number] Yes Take 1 tablet by mouth daily. 1000 units daily [provider] Taking Active   Collagen-Vitamin C-Biotin (COLLAGEN 1500/C PO) [id number] Yes Take by mouth. [provider] Taking Active   diltiazem (CARDIZEM SR) 120 MG 12 hr capsule [id number]  Take 1 capsule (120 mg total) by mouth 2 (two) times daily. Gwyneth Sprout, FNP  Active   Dulaglutide (TRULICITY) 1.5 ST/4.1DQ [facility] [id number] Yes Inject 1.5 mg into the skin once a week. Via [employer] patient assistance through Dec 2023 Virginia Crews, MD Taking Active   FIBER PO [id number] Yes Take by mouth 2 (two) times daily. [provider] Taking Active   fluticasone (FLONASE) 50  MCG/ACT nasal spray [id number] Yes SPRAY 2 SPRAYS INTO EACH NOSTRIL EVERY DAY Bacigalupo, Dionne Bucy, MD Taking Active   levothyroxine (SYNTHROID) 88 MCG tablet [id number] Yes Take 1 tablet (88 mcg total) by mouth daily. Virginia Crews, MD Taking Active   losartan-hydrochlorothiazide [facility]) 100-25 MG tablet [id number] Yes Take 1 tablet by mouth daily. Virginia Crews, MD Taking Active   lovastatin (MEVACOR) 20 MG tablet [id number] Yes Take 1 tablet (20 mg total) by mouth at bedtime. Virginia Crews, MD Taking Active   metoprolol (TOPROL-XL) 200 MG 24 hr tablet [id number] Yes Take 1 tablet (200 mg total) by mouth daily. Virginia Crews, MD Taking Active   Misc Natural Products (T-RELIEF CBD+13 SL) [id number] Yes Place 500 mg under the tongue daily. [provider] Taking Active Self  Nutritional Supplements (QUINOA/KALE/HEMP PO) [id number] Yes Place 750 mg under the tongue. [provider] Taking Active   Omega-3 Fatty Acids (FISH OIL) 1200 MG CAPS [id number] Yes Take by mouth daily. 2400 qam 1200 mg qpm [provider] Taking Active   potassium chloride (KLOR-CON M10) 10 MEQ tablet [id number] Yes Take 1 tablet (10 mEq total) by mouth daily. Virginia Crews, MD Taking Active             Patient Active Problem List   Diagnosis Date Noted   Chronic pain of right knee 01/14/2022   Hypertension associated with type 2 diabetes mellitus ([place]) 01/14/2022   Need for diphtheria-tetanus-pertussis (Tdap) vaccine 01/14/2022   Encounter for subsequent annual wellness visit (AWV) in Medicare patient 01/14/2022   Alcohol dependence, daily use ([place]) 01/14/2022   Annual physical exam 01/14/2022   Hyperlipidemia associated with type 2 diabetes mellitus ([place]) 06/15/2021   Overweight 06/15/2021   Statin intolerance 12/29/2020   Benign neoplasm of sigmoid colon    Mass of right breast 02/17/2016   History of parotitis 01/05/2016   Hypothyroidism 09/29/2015    Hyperlipemia 09/29/2015  Newly recognized murmur 09/29/2015   Allergic rhinitis 08/27/2015   Type 2 diabetes mellitus with other diabetic kidney complication ([place]) 02/33/4356   Avitaminosis D 08/27/2015   Compulsive tobacco user syndrome 08/27/2015    Immunization History  Administered Date(s) Administered   Fluad Quad(high Dose 65+) 10/15/2020   Influenza Split 10/21/2010, 10/13/2012   Influenza, High Dose Seasonal PF 10/04/2018, 09/28/2021   Influenza,inj,Quad PF,6+ Mos 10/02/2014, 09/09/2015   Influenza-Unspecified 09/21/2016, 10/03/2017, 09/20/2019   PFIZER Comirnaty(Gray Top)Covid-19 Tri-Sucrose Vaccine 07/13/2021   PFIZER(Purple Top)SARS-COV-2 Vaccination 02/18/2020, 02/23/2020, 09/10/2020   Pfizer Covid-19 Vaccine Bivalent Booster 49yr & up 12/28/2021   Pneumococcal Conjugate-13 07/21/2017   Pneumococcal Polysaccharide-23 01/22/2013, 08/23/2018   Tdap 10/21/2010, 01/14/2022   Zoster Recombinat (Shingrix) 04/27/2020, 01/27/2021   Zoster, Live 10/03/2013    Conditions to be addressed/monitored:  Hypertension, Hyperlipidemia, Diabetes, and Hypothyroidism  There are no care plans that you recently modified to display for this patient.     Medication Assistance:  Trulicity obtained through [employer] assistance program.  Enrollment ends Dec 2023  Compliance/Adherence/Medication fill history: Care Gaps: Influenza  Star-Rating Drugs: Losartan: Last filled 06/30/21 for 90-DS  [facility]  [id number] [place] [place]: [phone]: [phone] Uses pill box? Yes Pt endorses 100% compliance  We discussed: Current pharmacy is preferred with insurance plan and patient is satisfied with pharmacy services Patient decided to: Continue current medication management strategy  Care Plan and Follow Up Patient Decision:  Patient agrees to Care Plan and  Follow-up.  Plan: Telephone follow up appointment with care management team member scheduled for:  02/21/2022 at 11:00 AM   AlJunius ArgylePharmD, BCPara MarchCPP  Clinical Pharmacist [phone] Barriers:  Unable to independently afford treatment regimen  Pharmacist Clinical Goal(s):  Patient will verbalize ability to afford treatment regimen maintain control of blood pressure as evidenced by BP less than 140/90  through collaboration with PharmD and provider.   Interventions: 1:1 collaboration with PaGwyneth SproutFNP regarding development and update of comprehensive plan of care as evidenced by provider attestation and co-signature Inter-disciplinary care team collaboration (see longitudinal plan of care) Comprehensive medication review performed; medication list updated in electronic medical record  Hypertension (BP goal <140/90) -Controlled -History of heart murmur -Current treatment: Diltiazem SR 120 mg AM, 120 mg PM  Losartan-HCTZ 100-25 mg daily  Metoprolol XL 200 mg daily  -Medications previously tried: Olmesartan-Amlodipine-HCTZ   -Current home readings:  140/78,  152/77; pulse 63  133/64; pulse 69  120/64; pulse 68  -Denies hypotensive/hypertensive symptoms -Continue current medications  Hyperlipidemia: (LDL goal < 100) -Controlled -Current treatment: Lovastatin 20 mg nightly  -Medications previously tried: NA  -Recommended to continue current medication  Diabetes (A1c goal <7%) -Controlled -Current medications: Trulicity 1.5 mg weekly  -Medications previously tried: NA  -Current home glucose readings fasting glucose: Not routinely monitoring  -Denies hypoglycemic/hyperglycemic symptoms -Working on weight loss via [employer] CorporationLost 36 pounds since starting.  -Recommended to continue current medication  Hypothyroidism (Goal: Maintain stable thyroid function) -Controlled -Current treatment  Levothyroxine 88 mcg daily   -Medications previously tried: NA  -Recommended to continue current medication  Allergic Rhinitis (Goal: Minimize symptoms) -Controlled -Current treatment  Flonase 50 mcg/act 2 sprays nightly  -Medications previously tried: NA -Scratchy throat, otherwise states symptoms well controlled -Continue current medications  Tobacco use (Goal ***) -{US controlled/uncontrolled:25276} -Previous quit attempts: Chantix (vivid dreams,  poor sleep).  -Current treatment  2 packs per day.  -Patient smokes Within 30 minutes of waking -Patient triggers include: finishing a meal and drinking coffee and {Social Triggers:23876} -On a scale of 1-10, reports MOTIVATION to quit is *** -On a scale of 1-10, reports CONFIDENCE in quitting is *** -{Smoking Cessation Counseling:23883} -{CCMPHARMDINTERVENTION:25122}   Patient Goals/Self-Care Activities Patient will:  - check blood pressure weekly, document, and provide at future appointments  Follow Up Plan: ***

## 2022-02-24 ENCOUNTER — Other Ambulatory Visit: Payer: Self-pay | Admitting: Family Medicine

## 2022-02-24 DIAGNOSIS — Z1231 Encounter for screening mammogram for malignant neoplasm of breast: Secondary | ICD-10-CM

## 2022-03-18 DIAGNOSIS — I152 Hypertension secondary to endocrine disorders: Secondary | ICD-10-CM

## 2022-03-18 DIAGNOSIS — E1159 Type 2 diabetes mellitus with other circulatory complications: Secondary | ICD-10-CM

## 2022-03-28 ENCOUNTER — Telehealth: Payer: Self-pay

## 2022-03-28 NOTE — Progress Notes (Signed)
? ? ?Chronic Care Management ?Pharmacy Assistant  ? ?Name: Heather Burke  MRN: [id number] DOB: [birth year] ? ?Reason for Encounter: Hypertension Disease State Call ?  ?Recent office visits:  ?None ID ? ?Recent consult visits:  ?None ID ? ?Hospital visits:  ?None in previous 6 months ? ?Medications: ?Outpatient Encounter Medications as of 03/28/2022  ?Medication Sig  ? aspirin 81 MG tablet Take 81 mg by mouth daily.  ? Bisacodyl (GENTLE LAXATIVE PO) Take by mouth.  ? calcium carbonate (OS-CAL) 600 MG TABS tablet Take 1 tablet (600 mg total) by mouth 2 (two) times daily with a meal.  ? CANNABIDIOL PO Take 500 mg by mouth daily.  ? Cholecalciferol (D3 ADULT PO) Take 1 tablet by mouth daily. 1000 units daily  ? Collagen-Vitamin C-Biotin (COLLAGEN 1500/C PO) Take by mouth.  ? diltiazem (CARDIZEM SR) 120 MG 12 hr capsule Take 1 capsule (120 mg total) by mouth 2 (two) times daily.  ? Dulaglutide (TRULICITY) 1.5 WU/9.8JX SOPN Inject 1.5 mg into the skin once a week. Via [employer] patient assistance through Dec 2023  ? FIBER PO Take by mouth 2 (two) times daily.  ? fluticasone (FLONASE) 50 MCG/ACT nasal spray SPRAY 2 SPRAYS INTO EACH NOSTRIL EVERY DAY  ? levothyroxine (SYNTHROID) 88 MCG tablet Take 1 tablet (88 mcg total) by mouth daily.  ? losartan-hydrochlorothiazide (HYZAAR) 100-25 MG tablet Take 1 tablet by mouth daily.  ? lovastatin (MEVACOR) 20 MG tablet Take 1 tablet (20 mg total) by mouth at bedtime.  ? metoprolol (TOPROL-XL) 200 MG 24 hr tablet Take 1 tablet (200 mg total) by mouth daily.  ? Misc Natural Products (T-RELIEF CBD+13 SL) Place 500 mg under the tongue daily.  ? Nutritional Supplements (QUINOA/KALE/HEMP PO) Place 750 mg under the tongue.  ? Omega-3 Fatty Acids (FISH OIL) 1200 MG CAPS Take by mouth daily. 2400 qam 1200 mg qpm  ? potassium chloride (KLOR-CON M10) 10 MEQ tablet Take 1 tablet (10 mEq total) by mouth daily.  ? ?No facility-administered encounter medications on file as of 03/28/2022.  ? ?Care  Gaps: ?Diabetic Eye Exam ? ?Star Rating Drugs: ?Trulicity 1.5 mg patient receives this medication through the patient assistance program with [employer] ?Lovastatin 20 mg last filled on 12/01/2021 [facility]Day supply [facility] ?Losartan-HCTZ 100-25 mg last filled on 12/01/2021 for a [facility]-Day supply with [facility] ? ?Reviewed chart prior to disease state call. Spoke with patient regarding BP ? ?Recent Office Vitals: ?BP Readings from Last 3 Encounters:  ?01/14/22 133/64  ?06/15/21 (!) 113/56  ?03/15/21 (!) 165/82  ? ?Pulse Readings from Last 3 Encounters:  ?01/14/22 63  ?06/15/21 80  ?03/15/21 62  ?  ?Wt Readings from Last 3 Encounters:  ?01/14/22 157 lb 1.6 oz (71.3 kg)  ?06/15/21 161 lb (73 kg)  ?03/15/21 173 lb (78.5 kg)  ?  ? ?Kidney Function ?Lab Results  ?Component Value Date/Time  ? CREATININE 0.73 01/14/2022 10:32 AM  ? CREATININE 0.68 06/17/2021 01:38 PM  ? GFRNONAA 60 12/04/2019 10:11 AM  ? GFRAA 69 12/04/2019 10:11 AM  ? ? ? ?  Latest Ref Rng & Units 01/14/2022  ? 10:32 AM 06/17/2021  ?  1:38 PM 12/04/2019  ? 10:11 AM  ?BMP  ?Glucose 70 - 99 mg/dL 91   93   150    ?BUN 8 - 27 mg/dL '19   18   12    '$ ?Creatinine 0.57 - 1.00 mg/dL 0.73   0.68  0.98    ?BUN/Creat Ratio 12 - '28 26   26   12    '$ ?Sodium 134 - 144 mmol/L 141   138   138    ?Potassium 3.5 - 5.2 mmol/L 4.8   4.1   4.0    ?Chloride 96 - 106 mmol/L 99   96   96    ?CO2 20 - 29 mmol/L '27   26   22    '$ ?Calcium 8.7 - 10.3 mg/dL 10.1   10.0   10.2    ? ? ?Current antihypertensive regimen:  ?Diltiazem SR 120 mg AM, 120 mg PM ?Losartan-HCTZ 100-25 mg daily ?Metoprolol XL 200 mg daily ? ?How often are you checking your Blood Pressure? daily ? ?Current home BP readings: Patient was not home but she stated last time she took it her systolic was 176, but she can't remember what her Diastolic was. She does state she has not had any 140/90's recently ? ?What recent interventions/DTPs have been made by any provider to improve Blood Pressure  control since last CPP Visit: None ID ? ?Any recent hospitalizations or ED visits since last visit with CPP? No ? ?What diet changes have been made to improve Blood Pressure Control?  ?Patient hasn't made any new changes to her diet ? ?What exercise is being done to improve your Blood Pressure Control?  ?Patient still walks daily ? ?Adherence Review: ?Is the patient currently on ACE/ARB medication? Yes ?Does the patient have >5 day gap between last estimated fill dates? No ? ?I spoke to the patient, and she reports that she is doing well. She denies any hypertension symptoms at this time. Patient did report she tried getting her Diltiazem 120 mg filled but the pharmacy advised her it would be about 200.00. Patient stated she did not get it filled, but she still has a 60-Day supply left of her old prescription where she was taking it three times daily instead of twice daily. She stated she feels like they tried to fill it too soon so the insurance will not cover the medication for that reason. Patient advised when she runs out of this prescription she has she is going to try to refill again to see if insurance will cover as they have been with no issues.  ? ?I advised her to give me a call if there was still and issue when she does try to get the Diltiazem refilled. Patient verbalized understanding. Patient has no other concerns or issues at this time. ? ?Patient has a telephone appointment with [name], CPP on 05/24/2022 @ 1100 ? ?Lynann Bologna, CPA/CMA ?Clinical Pharmacist Assistant ?Phone: [phone]  ? ? ?

## 2022-04-15 ENCOUNTER — Encounter: Payer: Self-pay | Admitting: Family Medicine

## 2022-04-27 ENCOUNTER — Encounter: Payer: Self-pay | Admitting: Family Medicine

## 2022-04-27 ENCOUNTER — Ambulatory Visit (INDEPENDENT_AMBULATORY_CARE_PROVIDER_SITE_OTHER): Payer: PPO | Admitting: Family Medicine

## 2022-04-27 VITALS — BP 136/70 | HR 80 | Temp 98.9°F | Resp 20 | Wt 159.0 lb

## 2022-04-27 DIAGNOSIS — I152 Hypertension secondary to endocrine disorders: Secondary | ICD-10-CM | POA: Diagnosis not present

## 2022-04-27 DIAGNOSIS — J029 Acute pharyngitis, unspecified: Secondary | ICD-10-CM

## 2022-04-27 DIAGNOSIS — J441 Chronic obstructive pulmonary disease with (acute) exacerbation: Secondary | ICD-10-CM | POA: Insufficient documentation

## 2022-04-27 DIAGNOSIS — E1159 Type 2 diabetes mellitus with other circulatory complications: Secondary | ICD-10-CM | POA: Diagnosis not present

## 2022-04-27 LAB — POCT RAPID STREP A (OFFICE): Rapid Strep A Screen: NEGATIVE

## 2022-04-27 MED ORDER — ALBUTEROL SULFATE HFA 108 (90 BASE) MCG/ACT IN AERS: 2.0000 | INHALATION_SPRAY | Freq: Four times a day (QID) | RESPIRATORY_TRACT | 0 refills | Status: DC | PRN

## 2022-04-27 MED ORDER — AMOXICILLIN-POT CLAVULANATE 875-125 MG PO TABS: 1.0000 | ORAL_TABLET | Freq: Two times a day (BID) | ORAL | 0 refills | Status: DC

## 2022-04-27 NOTE — Assessment & Plan Note (Signed)
Chronic, stable on home readings; always elevated in office  ?Denies CP ?Denies SOB/ DOE ?Denies low blood pressure/hypotension ?Denies vision changes ?No LE Edema noted on exam ?Continue medication, Dilt SR 120 mg BID, Hyzaar 100-25 mg  ?Denies side effects ?Seek emergent care if you develop chest pain or chest pressure ? ?

## 2022-04-27 NOTE — Progress Notes (Signed)
?  ? ?I,Roshena L Chambers,acting as a scribe for Gwyneth Sprout, FNP.,have documented all relevant documentation on the behalf of Gwyneth Sprout, FNP,as directed by  Gwyneth Sprout, FNP while in the presence of Gwyneth Sprout, FNP.  ? ?Established patient visit ? ? ?Patient: Heather Burke   DOB: 29-Jul-1951   71 y.o. Female  MRN: [id number] ?Visit Date: 04/27/2022 ? ?Today's healthcare provider: Gwyneth Sprout, FNP  ?Re Introduced to nurse practitioner role and practice setting.  All questions answered.  Discussed provider/patient relationship and expectations. ? ? ?Chief Complaint  ?Patient presents with  ? Sore Throat  ? ?Subjective  ?  ?URI  ?This is a new problem. The current episode started yesterday. The problem has been gradually worsening. Maximum temperature: low grade up to 100.3. Associated symptoms include congestion (nasal and chest congestion), coughing, headaches, rhinorrhea and a sore throat. Pertinent negatives include no abdominal pain, chest pain, nausea or vomiting. Associated symptoms comments: Also low grade fever. She has tried NSAIDs for the symptoms. The treatment provided no relief.   ?Patient recently returned from traveling to the [place] last week; grandkids were sick 3 weeks ago with pink eye- pt did not get. Home COVID test yesterday was negative. ? ?Medications: ?Outpatient Medications Prior to Visit  ?Medication Sig  ? aspirin 81 MG tablet Take 81 mg by mouth daily.  ? Bisacodyl (GENTLE LAXATIVE PO) Take by mouth.  ? calcium carbonate (OS-CAL) 600 MG TABS tablet Take 1 tablet (600 mg total) by mouth 2 (two) times daily with a meal.  ? CANNABIDIOL PO Take 500 mg by mouth daily.  ? Cholecalciferol (D3 ADULT PO) Take 1 tablet by mouth daily. 1000 units daily  ? Collagen-Vitamin C-Biotin (COLLAGEN 1500/C PO) Take by mouth.  ? diltiazem (CARDIZEM SR) 120 MG 12 hr capsule Take 1 capsule (120 mg total) by mouth 2 (two) times daily.  ? Dulaglutide (TRULICITY) 1.5 VC/9.4WH SOPN Inject 1.5 mg  into the skin once a week. Via [employer] patient assistance through Dec 2023  ? FIBER PO Take by mouth 2 (two) times daily.  ? fluticasone (FLONASE) 50 MCG/ACT nasal spray SPRAY 2 SPRAYS INTO EACH NOSTRIL EVERY DAY  ? levothyroxine (SYNTHROID) 88 MCG tablet Take 1 tablet (88 mcg total) by mouth daily.  ? losartan-hydrochlorothiazide (HYZAAR) 100-25 MG tablet Take 1 tablet by mouth daily.  ? lovastatin (MEVACOR) 20 MG tablet Take 1 tablet (20 mg total) by mouth at bedtime.  ? metoprolol (TOPROL-XL) 200 MG 24 hr tablet Take 1 tablet (200 mg total) by mouth daily.  ? Misc Natural Products (T-RELIEF CBD+13 SL) Place 500 mg under the tongue daily.  ? Nutritional Supplements (QUINOA/KALE/HEMP PO) Place 750 mg under the tongue.  ? Omega-3 Fatty Acids (FISH OIL) 1200 MG CAPS Take by mouth daily. 2400 qam 1200 mg qpm  ? potassium chloride (KLOR-CON M10) 10 MEQ tablet Take 1 tablet (10 mEq total) by mouth daily.  ? ?No facility-administered medications prior to visit.  ? ? ?Review of Systems  ?Constitutional:  Positive for chills, fatigue and fever. Negative for appetite change.  ?HENT:  Positive for congestion (nasal and chest congestion), rhinorrhea, sore throat and trouble swallowing (painful swallowing).   ?Respiratory:  Positive for cough. Negative for chest tightness and shortness of breath.   ?Cardiovascular:  Negative for chest pain and palpitations.  ?Gastrointestinal:  Negative for abdominal pain, nausea and vomiting.  ?Neurological:  Positive for headaches. Negative for dizziness and weakness.  ? ? ?  Objective  ?  ?BP 136/70 Comment: home reading  Pulse 80   Temp 98.9 ?F (37.2 ?C) (Oral)   Resp 20   Wt 159 lb (72.1 kg)   SpO2 98% Comment: room air  BMI 28.62 kg/m?  ? ? ?Physical Exam ?Vitals and nursing note reviewed.  ?Constitutional:   ?   General: She is not in acute distress. ?   Appearance: Normal appearance. She is well-developed and overweight. She is not ill-appearing, toxic-appearing or  diaphoretic.  ?HENT:  ?   Head: Normocephalic and atraumatic.  ?   Right Ear: Tympanic membrane and ear canal normal.  ?   Left Ear: Tympanic membrane and ear canal normal.  ?   Nose: Rhinorrhea present. No congestion.  ?   Mouth/Throat:  ?   Mouth: Mucous membranes are moist.  ?   Pharynx: Oropharynx is clear.  ?   Tonsils: No tonsillar exudate or tonsillar abscesses. 0 on the right. 0 on the left.  ?Eyes:  ?   Conjunctiva/sclera: Conjunctivae normal.  ?   Pupils: Pupils are equal, round, and reactive to light.  ?Neck:  ?   Thyroid: No thyromegaly.  ?Cardiovascular:  ?   Rate and Rhythm: Normal rate and regular rhythm.  ?   Pulses: Normal pulses.  ?   Heart sounds: Normal heart sounds. No murmur heard. ?  No friction rub. No gallop.  ?Pulmonary:  ?   Effort: Pulmonary effort is normal. No respiratory distress.  ?   Breath sounds: Decreased air movement present. No stridor. Examination of the right-upper field reveals rhonchi. Examination of the left-upper field reveals rhonchi. Examination of the right-middle field reveals rhonchi. Examination of the left-middle field reveals rhonchi. Examination of the right-lower field reveals decreased breath sounds and rhonchi. Examination of the left-lower field reveals decreased breath sounds and rhonchi. Decreased breath sounds and rhonchi present. No wheezing or rales.  ?Chest:  ?   Chest wall: No tenderness.  ?Abdominal:  ?   General: Bowel sounds are normal.  ?   Palpations: Abdomen is soft.  ?Musculoskeletal:     ?   General: No swelling, tenderness, deformity or signs of injury. Normal range of motion.  ?   Cervical back: Normal range of motion and neck supple.  ?   Right lower leg: No edema.  ?   Left lower leg: No edema.  ?Lymphadenopathy:  ?   Cervical: No cervical adenopathy.  ?Skin: ?   General: Skin is warm and dry.  ?   Capillary Refill: Capillary refill takes less than 2 seconds.  ?   Coloration: Skin is not jaundiced or pale.  ?   Findings: No bruising,  erythema, lesion or rash.  ?Neurological:  ?   General: No focal deficit present.  ?   Mental Status: She is alert and oriented to person, place, and time. Mental status is at baseline.  ?   Cranial Nerves: No cranial nerve deficit.  ?   Sensory: No sensory deficit.  ?   Motor: No weakness.  ?   Coordination: Coordination normal.  ?Psychiatric:     ?   Mood and Affect: Mood normal.     ?   Behavior: Behavior normal.     ?   Thought Content: Thought content normal.     ?   Judgment: Judgment normal.  ?  ? ? ?Results for orders placed or performed in visit on 04/27/22  ?POCT rapid strep A  ?Result Value Ref Range  ?  Rapid Strep A Screen Negative Negative  ? ? Assessment & Plan  ?  ? ?Problem List Items Addressed This Visit   ? ?  ? Cardiovascular and Mediastinum  ? Hypertension associated with type 2 diabetes mellitus ([place])  ?  Chronic, stable on home readings; always elevated in office  ?Denies CP ?Denies SOB/ DOE ?Denies low blood pressure/hypotension ?Denies vision changes ?No LE Edema noted on exam ?Continue medication, Dilt SR 120 mg BID, Hyzaar 100-25 mg  ?Denies side effects ?Seek emergent care if you develop chest pain or chest pressure ? ?  ?  ?  ? Respiratory  ? COPD exacerbation ([place]) - Primary  ?  Acute on chronic concern; likely coupled with exposure from travel and grandchildren ?Negative strep in office ?Negative COVID ?Slight baseline allergies ?Coarse lung sounds throughout; RTC following use of BID augmentin for 10 days; PRN inhaler provided to assist with breathing ? ?  ?  ? Relevant Medications  ? amoxicillin-clavulanate (AUGMENTIN) 875-125 MG tablet  ? albuterol (VENTOLIN HFA) 108 (90 Base) MCG/ACT inhaler  ?  ? Other  ? Sore throat  ?  Acute, likely related to bronchospasm from chronic coughing d/t COPD exacerbation and irritants from allergies and substances ?Negative strep in office ?Exam WDL ? ?  ?  ? Relevant Orders  ? POCT rapid strep A (Completed)  ? ? ? ?Return in about 2 weeks (around  05/11/2022), or if symptoms worsen or fail to improve.  ?   ? ?I, Gwyneth Sprout, FNP, have reviewed all documentation for this visit. The documentation on 04/27/22 for the exam, diagnosis, procedures, and orders are all accurate

## 2022-04-27 NOTE — Assessment & Plan Note (Signed)
Acute on chronic concern; likely coupled with exposure from travel and grandchildren ?Negative strep in office ?Negative COVID ?Slight baseline allergies ?Coarse lung sounds throughout; RTC following use of BID augmentin for 10 days; PRN inhaler provided to assist with breathing ?

## 2022-04-27 NOTE — Assessment & Plan Note (Signed)
Acute, likely related to bronchospasm from chronic coughing d/t COPD exacerbation and irritants from allergies and substances ?Negative strep in office ?Exam WDL ?

## 2022-05-03 ENCOUNTER — Ambulatory Visit
Admission: RE | Admit: 2022-05-03 | Discharge: 2022-05-03 | Disposition: A | Discharge status: Home / Self Care | Payer: PPO | Source: Ambulatory Visit | Attending: Family Medicine | Admitting: Family Medicine

## 2022-05-03 DIAGNOSIS — Z1231 Encounter for screening mammogram for malignant neoplasm of breast: Secondary | ICD-10-CM | POA: Insufficient documentation

## 2022-05-04 ENCOUNTER — Other Ambulatory Visit: Payer: Self-pay | Admitting: Family Medicine

## 2022-05-04 DIAGNOSIS — R921 Mammographic calcification found on diagnostic imaging of breast: Secondary | ICD-10-CM

## 2022-05-04 DIAGNOSIS — R928 Other abnormal and inconclusive findings on diagnostic imaging of breast: Secondary | ICD-10-CM

## 2022-05-14 ENCOUNTER — Other Ambulatory Visit: Payer: Self-pay | Admitting: Family Medicine

## 2022-05-14 DIAGNOSIS — E1159 Type 2 diabetes mellitus with other circulatory complications: Secondary | ICD-10-CM

## 2022-05-14 DIAGNOSIS — E876 Hypokalemia: Secondary | ICD-10-CM

## 2022-05-17 ENCOUNTER — Ambulatory Visit: Payer: Self-pay | Admitting: *Deleted

## 2022-05-17 NOTE — Telephone Encounter (Signed)
Pt was put on abx 5/10. Was on abx 10 days. Told if not better in 2 weeks to call back.  Pt still has cough/congestion, not feeling welll.  Sore throat biggest issue.  Please advise.   Chief Complaint:Sore throat Symptoms: Sore throat 8/10, congestion, cough Frequency: Seen 04/27/22, completed course of antibiotics. Pertinent Negatives: Patient denies SOB, fever Disposition: '[]'$ ED /'[]'$ Urgent Care (no appt availability in office) / '[x]'$ Appointment(In office/virtual)/ '[]'$  [facility]/ '[]'$ Home Care/ '[]'$ Refused Recommended Disposition /'[]'$ [facility]/ '[]'$  Follow-up with PCP Additional Notes: Pt states advised to CB if not any better in 2 weeks. Completed antibiotics 05/07/22. Secured Mining engineer with [facility] per pt's schedule. Care advise provided, verbalizes understanding.  Reason for Disposition  SEVERE (e.g., excruciating) throat pain  Answer Assessment - Initial Assessment Questions 1. ONSET: "When did the throat start hurting?" (Hours or days ago)      04/27/22 2. SEVERITY: "How bad is the sore throat?" (Scale 1-10; mild, moderate or severe)   - MILD (1-3):  doesn't interfere with eating or normal activities   - MODERATE (4-7): interferes with eating some solids and normal activities   - SEVERE (8-10):  excruciating pain, interferes with most normal activities   - SEVERE DYSPHAGIA: can't swallow liquids, drooling     8/10 3. STREP EXPOSURE: "Has there been any exposure to strep within the past week?" If Yes, ask: "What type of contact occurred?"      No 4.  VIRAL SYMPTOMS: "Are there any symptoms of a cold, such as a runny nose, cough, hoarse voice or red eyes?"      Yes 5. FEVER: "Do you have a fever?" If Yes, ask: "What is your temperature, how was it measured, and when did it start?"     No 6. PUS ON THE TONSILS: "Is there pus on the tonsils in the back of your throat?"      7. OTHER SYMPTOMS: "Do you have any other symptoms?" (e.g., difficulty breathing, headache,  rash)     Congested, cough  Protocols used: Sore Throat-A-AH

## 2022-05-18 ENCOUNTER — Encounter: Payer: Self-pay | Admitting: Physician Assistant

## 2022-05-18 ENCOUNTER — Telehealth (INDEPENDENT_AMBULATORY_CARE_PROVIDER_SITE_OTHER): Payer: PPO | Admitting: Physician Assistant

## 2022-05-18 DIAGNOSIS — R0981 Nasal congestion: Secondary | ICD-10-CM

## 2022-05-18 DIAGNOSIS — R053 Chronic cough: Secondary | ICD-10-CM

## 2022-05-18 DIAGNOSIS — J029 Acute pharyngitis, unspecified: Secondary | ICD-10-CM

## 2022-05-18 NOTE — Progress Notes (Unsigned)
I,Roshena L Chambers,acting as a Education administrator for [employer], PA-C.,have documented all relevant documentation on the behalf of [name], PA-C,as directed by  [employer], PA-C while in the presence of [employer], PA-C.   MyChart Video Visit    Virtual Visit via Video Note   This visit type was conducted due to national recommendations for restrictions regarding the COVID-19 Pandemic (e.g. social distancing) in an effort to limit this patient's exposure and mitigate transmission in our community. This patient is at least at moderate risk for complications without adequate follow up. This format is felt to be most appropriate for this patient at this time. Physical exam was limited by quality of the video and audio technology used for the visit.   Patient location: home Provider location: [facility]  I discussed the limitations of evaluation and management by telemedicine and the availability of in person appointments. The patient expressed understanding and agreed to proceed.  Patient: Heather Burke   DOB: September 16, 1951   71 y.o. Female  MRN: [id number] Visit Date: 05/18/2022  Today's healthcare provider: [name], PA-C   Chief Complaint  Patient presents with   Sore Throat   Subjective    Sore Throat  Pertinent negatives include no abdominal pain, shortness of breath or vomiting.  Patient rates pain 8/10. She was seen in the office on 04/27/2022 for COPD exacerbation and sore throat. Strep test was performed and was negative. At that visit she was prescribed BID augmentin for 10 days; PRN inhaler provided to assist with breathing. She was told if not better in 2 weeks to call back.  Pt still has cough/congestion, not feeling welll.   Sore throat is her biggest issue. Patient reports having green eye drainage 5 days ago. She used her grandchildren prescription eye drops for conjunctivitis and her eye infection got resolved   Pt endorses long hx of seasonal allergies. Uses  flonase as needed. Denies having sinus pressure or facial pain  She has been taking ibuprofen and doing salt water gargles but ended up with the same symptoms again - cough, sore throat and congestion She traveled to [place] recently. Her symptoms got worse after return to [place].  Fraser Din smokes 2 pack a day. Denies having muscle aches or SOB  Endorses walking x4 times per week for 30-40' Denies having leg swelling, insomnia, wheezing.   Review of Systems  Constitutional:  Negative for appetite change, chills, fatigue and fever.  HENT:  Positive for postnasal drip and sore throat.   Respiratory:  Negative for chest tightness and shortness of breath.   Cardiovascular:  Negative for chest pain and palpitations.  Gastrointestinal:  Negative for abdominal pain, nausea and vomiting.  Neurological:  Negative for dizziness and weakness.      Objective    There were no vitals taken for this visit.     Physical Exam Constitutional:      General: She is not in acute distress.    Appearance: Normal appearance.  HENT:     Head: Normocephalic.  Pulmonary:     Effort: Pulmonary effort is normal. No respiratory distress.  Neurological:     Mental Status: She is alert and oriented to person, place, and time. Mental status is at baseline.       Assessment & Plan     1. Chronic cough Could be due to chronic bronchitis, smoking, COPD, infection/recent travel  Depending on PE, might need to repeat a course of antibiotics Or start on inhalers  or a course of prednisone if it is COPD exacerbation  2. Congestion of nasal sinus Could be allergy with asthmatic component Depending on PE, might need antihistamines, montelukast vs prednisone course Or symptomatic treatment with flonase, nasal saline rinse  3. Sore throat Suspected due to postnasal drip, infection associated with her recent travel to [place] bronchitis vs atypical pneumonia Might need to do imaging or  start on abx  Needs to have PE Will schedule for tomorrow.  FU as scheduled    I discussed the assessment and treatment plan with the patient. The patient was provided an opportunity to ask questions and all were answered. The patient agreed with the plan and demonstrated an understanding of the instructions.   The patient was advised to call back or seek an in-person evaluation if the symptoms worsen or if the condition fails to improve as anticipated.  I provided 15 minutes of non-face-to-face time during this encounter. The patient was advised to call back or seek an in-person evaluation if the symptoms worsen or if the condition fails to improve as anticipated.  I discussed the assessment and treatment plan with the patient. The patient was provided an opportunity to ask questions and all were answered. The patient agreed with the plan and demonstrated an understanding of the instructions.  The entirety of the information documented in the History of Present Illness, Review of Systems and Physical Exam were personally obtained by me. Portions of this information were initially documented by the CMA and reviewed by me for thoroughness and accuracy.  Portions of this note were created using dictation software and may contain typographical errors.    [name], PA-C [facility] [phone] (phone) [phone] (fax)  [place]

## 2022-05-18 NOTE — Progress Notes (Deleted)
I,Jana Nakoma Gotwalt,acting as a Education administrator for [employer], PA-C.,have documented all relevant documentation on the behalf of [name], PA-C,as directed by  [employer], PA-C while in the presence of [employer], PA-C.   Established patient visit   Patient: Heather Burke   DOB: [birth year]   71 y.o. Female  MRN: [id number] Visit Date: 05/19/2022  Today's healthcare provider: [name], PA-C   No chief complaint on file. CC: sore throat  Subjective    Heather Burke is a 71 yr old female presenting for sore throat.  Pertinent negatives include no abdominal pain, shortness of breath or vomiting.  Patient rates pain 8/10. She was seen in the office on 04/27/2022 for COPD exacerbation and sore throat. Strep test was performed and was negative. At that visit she was prescribed BID augmentin for 10 days; PRN inhaler provided to assist with breathing. She was told if not better in 2 weeks to call back.  Pt still has cough/congestion, not feeling welll.  Sore throat is her biggest issue. Patient reports having green eye drainage 5 days ago. She used her grandchildren prescription eye drops for conjunctivitis and her eye drainage resolved after using.    Medications: Outpatient Medications Prior to Visit  Medication Sig   albuterol (VENTOLIN HFA) 108 (90 Base) MCG/ACT inhaler Inhale 2 puffs into the lungs every 6 (six) hours as needed for wheezing or shortness of breath.   amoxicillin-clavulanate (AUGMENTIN) 875-125 MG tablet Take 1 tablet by mouth 2 (two) times daily.   aspirin 81 MG tablet Take 81 mg by mouth daily.   Bisacodyl (GENTLE LAXATIVE PO) Take by mouth.   calcium carbonate (OS-CAL) 600 MG TABS tablet Take 1 tablet (600 mg total) by mouth 2 (two) times daily with a meal.   CANNABIDIOL PO Take 500 mg by mouth daily.   Cholecalciferol (D3 ADULT PO) Take 1 tablet by mouth daily. 1000 units daily   Collagen-Vitamin C-Biotin (COLLAGEN 1500/C PO) Take by mouth.   diltiazem  (CARDIZEM SR) 120 MG 12 hr capsule Take 1 capsule (120 mg total) by mouth 2 (two) times daily.   Dulaglutide (TRULICITY) 1.5 WU/1.3KG SOPN Inject 1.5 mg into the skin once a week. [employer] patient assistance through Dec 2023   FIBER PO Take by mouth 2 (two) times daily.   fluticasone (FLONASE) 50 MCG/ACT nasal spray SPRAY 2 SPRAYS INTO EACH NOSTRIL EVERY DAY   KLOR-CON M10 10 MEQ tablet TAKE 1 TABLET BY MOUTH EVERY DAY   levothyroxine (SYNTHROID) 88 MCG tablet Take 1 tablet (88 mcg total) by mouth daily.   losartan-hydrochlorothiazide (HYZAAR) 100-25 MG tablet TAKE 1 TABLET BY MOUTH EVERY DAY   lovastatin (MEVACOR) 20 MG tablet Take 1 tablet (20 mg total) by mouth at bedtime.   metoprolol (TOPROL-XL) 200 MG 24 hr tablet TAKE 1 TABLET BY MOUTH EVERY DAY   Misc Natural Products (T-RELIEF CBD+13 SL) Place 500 mg under the tongue daily.   Nutritional Supplements (QUINOA/KALE/HEMP PO) Place 750 mg under the tongue.   Omega-3 Fatty Acids (FISH OIL) 1200 MG CAPS Take by mouth daily. 2400 qam 1200 mg qpm   No facility-administered medications prior to visit.    Review of Systems  HENT:  Positive for congestion and sore throat.   Respiratory:  Positive for cough.    {Labs  Heme  Chem  Endocrine  Serology  Results Review (optional):23779}   Objective    There were no vitals taken for this visit. {Show previous  vital signs (optional):23777}  Physical Exam  ***  No results found for any visits on 05/19/22.  Assessment & Plan     ***  No follow-ups on file.      {provider attestation***:1}   [name], Hershal Coria  [facility] [phone] (phone) [phone] (fax)  [place]

## 2022-05-19 ENCOUNTER — Ambulatory Visit: Payer: PPO | Admitting: Physician Assistant

## 2022-05-23 ENCOUNTER — Telehealth: Payer: Self-pay

## 2022-05-23 NOTE — Progress Notes (Signed)
    Chronic Care Management Pharmacy Assistant   Name: SHEY YOTT  MRN: [id number] DOB: [birth year]  Patient called to be reminded of her telephone appointment with [name], CPP on 05/24/2022 @ 1100  No answer, left message of appointment date, time and type of appointment (either telephone or in person). Left message to have all medications, supplements, blood pressure and/or blood sugar logs available during appointment and to return call if need to reschedule.  Star Rating Drug: Trulicity 1.5 mg patient receives this medication from [employer] [facility] Lovastatin 20 mg last filled on 03/03/2022 for a 90-Day supply with [facility] Losartan-HCTZ 100-25 mg last filled on 03/03/2022 for a 90-Day supply with [facility]  Any gaps in medications fill history? None  Care Gaps: None ID  Lynann Bologna, CPA/CMA Clinical Pharmacist Assistant Phone: [phone]

## 2022-05-24 ENCOUNTER — Ambulatory Visit (INDEPENDENT_AMBULATORY_CARE_PROVIDER_SITE_OTHER): Payer: PPO

## 2022-05-24 ENCOUNTER — Other Ambulatory Visit: Payer: Self-pay | Admitting: Family Medicine

## 2022-05-24 DIAGNOSIS — J441 Chronic obstructive pulmonary disease with (acute) exacerbation: Secondary | ICD-10-CM

## 2022-05-24 DIAGNOSIS — F17200 Nicotine dependence, unspecified, uncomplicated: Secondary | ICD-10-CM

## 2022-05-24 DIAGNOSIS — J302 Other seasonal allergic rhinitis: Secondary | ICD-10-CM

## 2022-05-24 DIAGNOSIS — E1159 Type 2 diabetes mellitus with other circulatory complications: Secondary | ICD-10-CM

## 2022-05-24 NOTE — Progress Notes (Signed)
Chronic Care Management Pharmacy Note  05/24/2022 Name:  Heather Burke MRN:  [id number] DOB:  11/19/1951  Summary: Patient presents for CCM follow-up. Patient is still not ready to quit smoking. She has not needed to use her albuterol inhaler.   Recommendations/Changes made from today's visit: -Given recent potential COPD exacerbation and ongoing tobacco use,  recommend referral to pulmonology for PFTs  Plan: CPP Follow-up 3 months  Subjective: Heather Burke is an 71 y.o. year old female who is a primary patient of Gwyneth Sprout, FNP.  The CCM team was consulted for assistance with disease management and care coordination needs.    Engaged with patient by telephone for follow up visit in response to provider referral for pharmacy case management and/or care coordination services.   Consent to Services:  The patient was given information about Chronic Care Management services, agreed to services, and gave verbal consent prior to initiation of services.  Please see initial visit note for detailed documentation.   Patient Care Team: Gwyneth Sprout, FNP as PCP - General (Family Medicine) Lorelee Cover., MD as Consulting Physician (Ophthalmology) Germaine Pomfret, [facility] as Pharmacist (Pharmacist)  Recent office visits: 05/18/22: Video visit with [name], PA-C for cough follow-up.  04/27/22: Patient presented to [name], FNP for COPD exacerbation.  01/14/22: Patient presented to [name], FNP for follow-up. Diltiazem 120 mg twice daily   Recent consult visits: None in previous 6 months  Hospital visits: None in previous 6 months   Objective:  Lab Results  Component Value Date   CREATININE 0.73 01/14/2022   BUN 19 01/14/2022   GFRNONAA 60 12/04/2019   GFRAA 69 12/04/2019   NA 141 01/14/2022   K 4.8 01/14/2022   CALCIUM 10.1 01/14/2022   CO2 27 01/14/2022   GLUCOSE 91 01/14/2022    Lab Results  Component Value Date/Time   HGBA1C 6.1 (H) 01/14/2022  10:32 AM   HGBA1C 6.1 (A) 06/15/2021 11:18 AM   HGBA1C 6.5 (A) 03/15/2021 10:35 AM   HGBA1C 6.9 (H) 12/04/2019 10:11 AM   MICROALBUR Negative 09/29/2015 11:26 AM    Last diabetic Eye exam:  Lab Results  Component Value Date/Time   HMDIABEYEEXA No Retinopathy 06/28/2021 12:00 AM    Last diabetic Foot exam: No results found for: HMDIABFOOTEX   Lab Results  Component Value Date   CHOL 161 01/14/2022   HDL 37 (L) 01/14/2022   LDLCALC 81 01/14/2022   TRIG 260 (H) 01/14/2022   CHOLHDL 4.4 01/14/2022       Latest Ref Rng & Units 01/14/2022   10:32 AM 06/17/2021    1:38 PM 12/04/2019   10:11 AM  Hepatic Function  Total Protein 6.0 - 8.5 g/dL 7.8   7.5   7.6    Albumin 3.8 - 4.8 g/dL 4.5   4.1   4.3    AST 0 - 40 IU/L 28   26   55    ALT 0 - 32 IU/L 31   34   43    Alk Phosphatase 44 - 121 IU/L 74   76   64    Total Bilirubin 0.0 - 1.2 mg/dL 0.3   0.4   0.5      Lab Results  Component Value Date/Time   TSH 1.440 01/14/2022 10:32 AM   TSH 2.060 06/17/2021 01:38 PM   FREET4 1.45 01/14/2022 10:32 AM       Latest Ref Rng & Units 12/04/2019   10:11  AM 09/18/2018    9:12 AM 01/16/2018    8:12 AM  CBC  WBC 3.4 - 10.8 x10E3/uL 7.0   6.6   7.4    Hemoglobin 11.1 - 15.9 g/dL 15.1   15.2   14.6    Hematocrit 34.0 - 46.6 % 43.8   43.5   41.9    Platelets 150 - 450 x10E3/uL 237   241   217      Lab Results  Component Value Date/Time   VD25OH 48.2 09/18/2018 09:12 AM   VD25OH 52.7 04/20/2016 08:04 AM    Clinical ASCVD: No  The 10-year ASCVD risk score (Arnett DK, et al., 2019) is: 38.9%   Values used to calculate the score:     Age: 5 years     Sex: Female     Is Non-Hispanic African American: No     Diabetic: Yes     Tobacco smoker: Yes     Systolic Blood Pressure: 951 mmHg     Is BP treated: Yes     HDL Cholesterol: 37 mg/dL     Total Cholesterol: 161 mg/dL       12/16/2021    9:04 AM 03/15/2021   10:38 AM 12/14/2020   11:16 AM  Depression screen PHQ 2/9   Decreased Interest 0 0 0  Down, Depressed, Hopeless 0 0 0  PHQ - 2 Score 0 0 0  Altered sleeping  0   Tired, decreased energy  0   Change in appetite  0   Feeling bad or failure about yourself   0   Trouble concentrating  0   Moving slowly or fidgety/restless  0   Suicidal thoughts  0   PHQ-9 Score  0   Difficult doing work/chores  Not difficult at all      Social History   Tobacco Use  Smoking Status Every Day   Packs/day: 2.00   Years: 49.00   Pack years: 98.00   Types: Cigarettes  Smokeless Tobacco Never   BP Readings from Last 3 Encounters:  04/27/22 136/70  01/14/22 133/64  06/15/21 (!) 113/56   Pulse Readings from Last 3 Encounters:  04/27/22 80  01/14/22 63  06/15/21 80   Wt Readings from Last 3 Encounters:  04/27/22 159 lb (72.1 kg)  01/14/22 157 lb 1.6 oz (71.3 kg)  06/15/21 161 lb (73 kg)   BMI Readings from Last 3 Encounters:  04/27/22 28.62 kg/m  01/14/22 28.28 kg/m  06/15/21 29.45 kg/m    Assessment/Interventions: Review of patient past medical history, allergies, medications, health status, including review of consultants reports, laboratory and other test data, was performed as part of comprehensive evaluation and provision of chronic care management services.   SDOH:  (Social Determinants of Health) assessments and interventions performed: Yes     SDOH Screenings   Alcohol Screen: Low Risk    Last Alcohol Screening Score (AUDIT): 4  Depression (PHQ2-9): Low Risk    PHQ-2 Score: 0  Financial Resource Strain: Low Risk    Difficulty of Paying Living Expenses: Not hard at all  Food Insecurity: No Food Insecurity   Worried About Charity fundraiser in the Last Year: Never true   Ran Out of Food in the Last Year: Never true  Housing: Low Risk    Last Housing Risk Score: 0  Physical Activity: Inactive   Days of Exercise per Week: 0 days   Minutes of Exercise per Session: 0 min  Social Connections: Socially Isolated  Frequency of  Communication with Friends and Family: More than three times a week   Frequency of Social Gatherings with Friends and Family: More than three times a week   Attends Religious Services: Never   Marine scientist or Organizations: No   Attends Archivist Meetings: Never   Marital Status: Widowed  Stress: No Stress Concern Present   Feeling of Stress : Not at all  Tobacco Use: High Risk   Smoking Tobacco Use: Every Day   Smokeless Tobacco Use: Never   Passive Exposure: Not on file  Transportation Needs: No Transportation Needs   Lack of Transportation (Medical): No   Lack of Transportation (Non-Medical): No    CCM Care Plan  Allergies  Allergen Reactions   Amlodipine Nausea Only and Other (See Comments)    Stomach cramping   Atorvastatin Nausea And Vomiting   Lisinopril Cough   Pravastatin Nausea And Vomiting   Nifedipine Er Other (See Comments)    headache    Medications Reviewed Today     Reviewed by [name], PA-C (Physician Assistant Certified) on 73/42/87 at 1246  Med List Status: <None>   Medication Order Taking? Sig Documenting Provider Last Dose Status Informant  albuterol (VENTOLIN HFA) 108 (90 Base) MCG/ACT inhaler [id number]  Inhale 2 puffs into the lungs every 6 (six) hours as needed for wheezing or shortness of breath. Gwyneth Sprout, FNP  Active   amoxicillin-clavulanate (AUGMENTIN) 875-125 MG tablet [id number]  Take 1 tablet by mouth 2 (two) times daily. Gwyneth Sprout, FNP  Active   aspirin 81 MG tablet [id number] No Take 81 mg by mouth daily. [provider] Taking Active   Bisacodyl (GENTLE LAXATIVE PO) [id number] No Take by mouth. [provider] Taking Active   calcium carbonate (OS-CAL) 600 MG TABS tablet [id number] No Take 1 tablet (600 mg total) by mouth 2 (two) times daily with a meal. Forest Gleason, MD Taking Active   CANNABIDIOL PO [id number] No Take 500 mg by mouth daily. [provider] Taking Active    Cholecalciferol (D3 ADULT PO) [id number] No Take 1 tablet by mouth daily. 1000 units daily [provider] Taking Active   Collagen-Vitamin C-Biotin (COLLAGEN 1500/C PO) [id number] No Take by mouth. [provider] Taking Active   diltiazem (CARDIZEM SR) 120 MG 12 hr capsule [id number] No Take 1 capsule (120 mg total) by mouth 2 (two) times daily. Gwyneth Sprout, FNP Taking Active   Dulaglutide (TRULICITY) 1.5 VV/7.4MO SOPN [id number] No Inject 1.5 mg into the skin once a week. Via [employer] patient assistance through Dec 2023 Virginia Crews, MD Taking Active   FIBER PO [id number] No Take by mouth 2 (two) times daily. [provider] Taking Active   fluticasone (FLONASE) 50 MCG/ACT nasal spray [id number] No SPRAY 2 SPRAYS INTO EACH NOSTRIL EVERY DAY Bacigalupo, Dionne Bucy, MD Taking Active   KLOR-CON M10 10 MEQ tablet [id number]  TAKE 1 TABLET BY MOUTH EVERY DAY Gwyneth Sprout, FNP  Active   levothyroxine (SYNTHROID) 88 MCG tablet [id number] No Take 1 tablet (88 mcg total) by mouth daily. Virginia Crews, MD Taking Active   losartan-hydrochlorothiazide [facility]) 100-25 MG tablet [id number]  TAKE 1 TABLET BY MOUTH EVERY DAY Gwyneth Sprout, FNP  Active   lovastatin (MEVACOR) 20 MG tablet [id number] No Take 1 tablet (20 mg total) by mouth at bedtime. Virginia Crews, MD Taking Active   metoprolol (TOPROL-XL) 200 MG 24 hr tablet [id number]  TAKE  1 TABLET BY MOUTH EVERY DAY Gwyneth Sprout, FNP  Active   Misc Natural Products (T-RELIEF CBD+13 SL) [id number] No Place 500 mg under the tongue daily. [provider] Taking Active Self  Nutritional Supplements (QUINOA/KALE/HEMP PO) [id number] No Place 750 mg under the tongue. [provider] Taking Active   Omega-3 Fatty Acids (FISH OIL) 1200 MG CAPS [id number] No Take by mouth daily. 2400 qam 1200 mg qpm [provider] Taking Active             Patient Active Problem List   Diagnosis Date Noted    COPD exacerbation ([place]) 04/27/2022   Sore throat 04/27/2022   Chronic pain of right knee 01/14/2022   Hypertension associated with type 2 diabetes mellitus ([place]) 01/14/2022   Need for diphtheria-tetanus-pertussis (Tdap) vaccine 01/14/2022   Encounter for subsequent annual wellness visit (AWV) in Medicare patient 01/14/2022   Alcohol dependence, daily use ([place]) 01/14/2022   Annual physical exam 01/14/2022   Hyperlipidemia associated with type 2 diabetes mellitus ([place]) 06/15/2021   Overweight 06/15/2021   Statin intolerance 12/29/2020   Benign neoplasm of sigmoid colon    Mass of right breast 02/17/2016   History of parotitis 01/05/2016   Hypothyroidism 09/29/2015   Hyperlipemia 09/29/2015   Newly recognized murmur 09/29/2015   Allergic rhinitis 08/27/2015   Type 2 diabetes mellitus with other diabetic kidney complication ([place]) 93/81/8299   Avitaminosis D 08/27/2015   Compulsive tobacco user syndrome 08/27/2015    Immunization History  Administered Date(s) Administered   Fluad Quad(high Dose 65+) 10/15/2020   Influenza Split 10/21/2010, 10/13/2012   Influenza, High Dose Seasonal PF 10/04/2018, 09/28/2021   Influenza,inj,Quad PF,6+ Mos 10/02/2014, 09/09/2015   Influenza-Unspecified 09/21/2016, 10/03/2017, 09/20/2019   PFIZER Comirnaty(Gray Top)Covid-19 Tri-Sucrose Vaccine 07/13/2021   PFIZER(Purple Top)SARS-COV-2 Vaccination 02/18/2020, 02/23/2020, 09/10/2020   Pfizer Covid-19 Vaccine Bivalent Booster 4yr & up 12/28/2021   Pneumococcal Conjugate-13 07/21/2017   Pneumococcal Polysaccharide-23 01/22/2013, 08/23/2018   Tdap 10/21/2010, 01/14/2022   Zoster Recombinat (Shingrix) 04/27/2020, 01/27/2021   Zoster, Live 10/03/2013    Conditions to be addressed/monitored:  Hypertension, Hyperlipidemia, Diabetes, and Hypothyroidism  Care Plan : General Pharmacy (Adult)  Updates made by FGermaine Pomfret RDays [place] 05/24/2022 12:00 AM     Problem: Hypertension, Hyperlipidemia,  Diabetes, and Hypothyroidism   Priority: High     Long-Range Goal: Patient-Specific Goal   Start Date: 08/12/2021  Expected End Date: 08/12/2022  This Visit's Progress: On track  Recent Progress: On track  Priority: High  Note:   Current Barriers:  Unable to independently afford treatment regimen  Pharmacist Clinical Goal(s):  Patient will verbalize ability to afford treatment regimen maintain control of blood pressure as evidenced by BP less than 140/90  through collaboration with PharmD and provider.   Interventions: 1:1 collaboration with PGwyneth Sprout FNP regarding development and update of comprehensive plan of care as evidenced by provider attestation and co-signature Inter-disciplinary care team collaboration (see longitudinal plan of care) Comprehensive medication review performed; medication list updated in electronic medical record  Hypertension (BP goal <140/90) -Controlled -History of heart murmur -Current treatment: Diltiazem SR 120 mg AM, 120 mg PM: Appropriate, Effective, Safe, Accessible Losartan-HCTZ 100-25 mg daily: Appropriate, Effective, Safe, Accessible  Metoprolol XL 200 mg daily: Appropriate, Effective, Safe, Accessible  -Medications previously tried: Olmesartan-Amlodipine-HCTZ   -Current home readings:  131/69;  145/76 137/74  -Denies hypotensive/hypertensive symptoms -Continue current medications  Hyperlipidemia: (LDL goal < 100) -Controlled -Current treatment: Lovastatin 20 mg nightly  -Medications previously  tried: NA  -Recommended to continue current medication  Diabetes (A1c goal <7%) -Controlled -Current medications: Trulicity 1.5 mg weekly  -Medications previously tried: NA  -Current home glucose readings fasting glucose: Not routinely monitoring  -Denies hypoglycemic/hyperglycemic symptoms -Working on weight loss via [employer]. Lost 36 pounds since starting.  -Recommended to continue current medication  Hypothyroidism (Goal:  Maintain stable thyroid function) -Controlled -Current treatment  Levothyroxine 88 mcg daily  -Medications previously tried: NA  -Recommended to continue current medication  Allergic Rhinitis (Goal: Minimize symptoms) -Controlled -Current treatment  Flonase 50 mcg/act 2 sprays nightly Xyzal 5 mg nightly   -Medications previously tried: NA -Scratchy throat, otherwise states symptoms well controlled -Continue current medications  Tobacco use (Goal Quit smoking) -Uncontrolled -Previous quit attempts: Chantix (vivid dreams, poor sleep).  -Current treatment  2 packs per day.  -Patient smokes Within 30 minutes of waking -Patient triggers include: finishing a meal and drinking coffee and seeing someone else smoke -Patient in a pre-contemplative stage of action for tobacco cessation  -Given recent potential COPD exacerbation and ongoing tobacco use,  recommend referral to pulmonology for PFTs  Patient Goals/Self-Care Activities Patient will:  - check blood pressure weekly, document, and provide at future appointments  Follow Up Plan: Telephone follow up appointment with care management team member scheduled for:  08/26/2022 at 11:00 AM     Medication Assistance:  Trulicity obtained through [employer] medication assistance program.  Enrollment ends Dec 2023  Compliance/Adherence/Medication fill history: Care Gaps: Influenza  Star-Rating Drugs: Losartan-HCTZ: Last filled 03/03/22 for 90-DS  Patient's [facility] pharmacy [facility]:  [id number] [place] [address]Phone: [phone]: [phone]  Uses pill box? Yes Pt endorses 100% compliance  We discussed: Current pharmacy is preferred with insurance plan and patient is satisfied with pharmacy services Patient decided to: Continue current medication management strategy  Care Plan and Follow Up Patient Decision:  Patient agrees to Care Plan and  Follow-up.  Plan: Telephone follow up appointment with care management team member scheduled for:  08/26/2022 at 11:00 AM   AJunius Argyle PharmD, BPara [phone]

## 2022-05-24 NOTE — Patient Instructions (Signed)
Visit Information It was great speaking with you today!  Please let me know if you have any questions about our visit.   Goals Addressed             This Visit's Progress    Quit Smoking   Not on track    Recommend to continue efforts to reduce smoking habits until no longer smoking.      Track and Manage My Blood Pressure-Hypertension   Not on track    Timeframe:  Long-Range Goal Priority:  High Start Date: 08/10/2021                             Expected End Date:  08/10/2022                     Follow Up within 90 days   - check blood pressure weekly - write blood pressure results in a log or diary    Why is this important?   You won't feel high blood pressure, but it can still hurt your blood vessels.  High blood pressure can cause heart or kidney problems. It can also cause a stroke.  Making lifestyle changes like losing a little weight or eating less salt will help.  Checking your blood pressure at home and at different times of the day can help to control blood pressure.  If the doctor prescribes medicine remember to take it the way the doctor ordered.  Call the office if you cannot afford the medicine or if there are questions about it.     Notes:         Patient Care Plan: General Pharmacy (Adult)     Problem Identified: Hypertension, Hyperlipidemia, Diabetes, and Hypothyroidism   Priority: High     Long-Range Goal: Patient-Specific Goal   Start Date: 08/12/2021  Expected End Date: 08/12/2022  This Visit's Progress: On track  Recent Progress: On track  Priority: High  Note:   Current Barriers:  Unable to independently afford treatment regimen  Pharmacist Clinical Goal(s):  Patient will verbalize ability to afford treatment regimen maintain control of blood pressure as evidenced by BP less than 140/90  through collaboration with PharmD and provider.   Interventions: 1:1 collaboration with Gwyneth Sprout, FNP regarding development and update of  comprehensive plan of care as evidenced by provider attestation and co-signature Inter-disciplinary care team collaboration (see longitudinal plan of care) Comprehensive medication review performed; medication list updated in electronic medical record  Hypertension (BP goal <140/90) -Controlled -History of heart murmur -Current treatment: Diltiazem SR 120 mg AM, 120 mg PM: Appropriate, Effective, Safe, Accessible Losartan-HCTZ 100-25 mg daily: Appropriate, Effective, Safe, Accessible  Metoprolol XL 200 mg daily: Appropriate, Effective, Safe, Accessible  -Medications previously tried: Olmesartan-Amlodipine-HCTZ   -Current home readings:  131/69;  145/76 137/74  -Denies hypotensive/hypertensive symptoms -Continue current medications  Hyperlipidemia: (LDL goal < 100) -Controlled -Current treatment: Lovastatin 20 mg nightly  -Medications previously tried: NA  -Recommended to continue current medication  Diabetes (A1c goal <7%) -Controlled -Current medications: Trulicity 1.5 mg weekly  -Medications previously tried: NA  -Current home glucose readings fasting glucose: Not routinely monitoring  -Denies hypoglycemic/hyperglycemic symptoms -Working on weight loss via [employer]. Lost 36 pounds since starting.  -Recommended to continue current medication  Hypothyroidism (Goal: Maintain stable thyroid function) -Controlled -Current treatment  Levothyroxine 88 mcg daily  -Medications previously tried: NA  -Recommended to continue current medication  Allergic Rhinitis (  Goal: Minimize symptoms) -Controlled -Current treatment  Flonase 50 mcg/act 2 sprays nightly Xyzal 5 mg nightly   -Medications previously tried: NA -Scratchy throat, otherwise states symptoms well controlled -Continue current medications  Tobacco use (Goal Quit smoking) -Uncontrolled -Previous quit attempts: Chantix (vivid dreams, poor sleep).  -Current treatment  2 packs per day.  -Patient smokes  Within 30 minutes of waking -Patient triggers include: finishing a meal and drinking coffee and seeing someone else smoke -Patient in a pre-contemplative stage of action for tobacco cessation  -Given recent potential COPD exacerbation and ongoing tobacco use,  recommend referral to pulmonology for PFTs  Patient Goals/Self-Care Activities Patient will:  - check blood pressure weekly, document, and provide at future appointments  Follow Up Plan: Telephone follow up appointment with care management team member scheduled for:  08/26/2022 at 11:00 AM       Patient agreed to services and verbal consent obtained.   Patient verbalizes understanding of instructions and care plan provided today and agrees to view in [place]. Active MyChart status and patient understanding of how to access instructions and care plan via MyChart confirmed with patient.     [name], PharmD, Para [place], CPP  Clinical Pharmacist Practitioner  [facility] [phone]

## 2022-05-25 ENCOUNTER — Other Ambulatory Visit: Payer: Self-pay | Admitting: Family Medicine

## 2022-05-25 DIAGNOSIS — E039 Hypothyroidism, unspecified: Secondary | ICD-10-CM

## 2022-05-26 ENCOUNTER — Ambulatory Visit
Admission: RE | Admit: 2022-05-26 | Discharge: 2022-05-26 | Disposition: A | Discharge status: Home / Self Care | Payer: PPO | Source: Ambulatory Visit | Attending: Family Medicine | Admitting: Family Medicine

## 2022-05-26 DIAGNOSIS — R928 Other abnormal and inconclusive findings on diagnostic imaging of breast: Secondary | ICD-10-CM | POA: Diagnosis not present

## 2022-05-26 DIAGNOSIS — R921 Mammographic calcification found on diagnostic imaging of breast: Secondary | ICD-10-CM | POA: Insufficient documentation

## 2022-05-26 NOTE — Progress Notes (Signed)
Repeat breast cancer screening recommendations in 6 months due to R breast calcifications seen on screening mammogram. Please let us know if you have any questions.  Thank you, Gwyneth Sprout, [address] [place], [place] [address] [phone] (phone) [phone] (fax) [place]

## 2022-06-02 ENCOUNTER — Other Ambulatory Visit: Payer: Self-pay | Admitting: Family Medicine

## 2022-06-02 DIAGNOSIS — J302 Other seasonal allergic rhinitis: Secondary | ICD-10-CM

## 2022-06-06 ENCOUNTER — Encounter: Payer: Self-pay | Admitting: Family Medicine

## 2022-06-07 ENCOUNTER — Other Ambulatory Visit: Payer: Self-pay | Admitting: Family Medicine

## 2022-06-07 ENCOUNTER — Telehealth: Payer: Self-pay

## 2022-06-07 DIAGNOSIS — I10 Essential (primary) hypertension: Secondary | ICD-10-CM

## 2022-06-07 NOTE — Chronic Care Management (AMB) (Signed)
  Chronic Care Management   Note  06/07/2022 Name: Heather Burke MRN: [id number] DOB: 09-04-1951  Heather Burke is a 71 y.o. year old female who is a primary care patient of Gwyneth Sprout, FNP. I reached out to [name] by phone today in response to a referral sent by Ms. [place] A Hammac's PCP.  Ms. Bischoff was given information about Chronic Care Management services today including:  CCM service includes personalized support from designated clinical staff supervised by her physician, including individualized plan of care and coordination with other care providers 24/7 contact phone numbers for assistance for urgent and routine care needs. Service will only be billed when office clinical staff spend 20 minutes or more in a month to coordinate care. Only one practitioner may furnish and bill the service in a calendar month. The patient may stop CCM services at any time (effective at the end of the month) by phone call to the office staff. The patient is responsible for co-pay (up to 20% after annual deductible is met) if co-pay is required by the individual health plan.   Patient agreed to services and verbal consent obtained.   Follow up plan: Telephone appointment with care management team member scheduled for:06/10/2022  Noreene Larsson, [address], [place], [place] [address] Direct Dial: [phone] [email or web address]: [email or web address]

## 2022-06-09 ENCOUNTER — Telehealth: Payer: Self-pay

## 2022-06-09 NOTE — Progress Notes (Cosign Needed)
    Chronic Care Management Pharmacy Assistant   Name: Heather Burke  MRN: [id number] DOB: [birth year]  Patient called to be reminded of her telephone appointment with [name], CPP on 06/10/2022 @ 0930.  Patient aware of appointment date, time, and type of appointment (either telephone or in person). Patient aware to have/bring all medications, supplements, blood pressure and/or blood sugar logs to visit.  Star Rating Drug: Trulicity 1.5 mg patient receives this medication from [employer] Patient Assistance Program Lovastatin 20 mg last filled on 06/02/2022 for a [facility]-Day supply with [facility] Losartan-HCTZ 100-25 mg last filled on 06/02/2022 for a 90-Day supply with [facility]  Any gaps in medications fill history? None  Care Gaps: None ID  Heather Burke, CPA/CMA Clinical Pharmacist Assistant Phone: [phone]

## 2022-06-10 ENCOUNTER — Telehealth: Payer: Self-pay

## 2022-06-10 ENCOUNTER — Ambulatory Visit: Payer: PPO

## 2022-06-10 DIAGNOSIS — I152 Hypertension secondary to endocrine disorders: Secondary | ICD-10-CM

## 2022-06-10 DIAGNOSIS — E1169 Type 2 diabetes mellitus with other specified complication: Secondary | ICD-10-CM

## 2022-06-10 DIAGNOSIS — E1159 Type 2 diabetes mellitus with other circulatory complications: Secondary | ICD-10-CM

## 2022-06-10 MED ORDER — DILTIAZEM HCL ER 240 MG PO CP24: 240.0000 mg | ORAL_CAPSULE | Freq: Every day | ORAL | 1 refills | Status: DC

## 2022-06-17 DIAGNOSIS — I10 Essential (primary) hypertension: Secondary | ICD-10-CM

## 2022-06-17 DIAGNOSIS — E1159 Type 2 diabetes mellitus with other circulatory complications: Secondary | ICD-10-CM

## 2022-06-17 DIAGNOSIS — F17210 Nicotine dependence, cigarettes, uncomplicated: Secondary | ICD-10-CM | POA: Diagnosis not present

## 2022-06-17 DIAGNOSIS — E039 Hypothyroidism, unspecified: Secondary | ICD-10-CM

## 2022-06-17 DIAGNOSIS — E785 Hyperlipidemia, unspecified: Secondary | ICD-10-CM | POA: Diagnosis not present

## 2022-06-17 DIAGNOSIS — Z7985 Long-term (current) use of injectable non-insulin antidiabetic drugs: Secondary | ICD-10-CM

## 2022-06-29 ENCOUNTER — Encounter: Payer: Self-pay | Admitting: Cardiovascular Disease

## 2022-07-05 DIAGNOSIS — H40153 Residual stage of open-angle glaucoma, bilateral: Secondary | ICD-10-CM | POA: Diagnosis not present

## 2022-07-05 DIAGNOSIS — E113393 Type 2 diabetes mellitus with moderate nonproliferative diabetic retinopathy without macular edema, bilateral: Secondary | ICD-10-CM | POA: Diagnosis not present

## 2022-07-14 ENCOUNTER — Ambulatory Visit (INDEPENDENT_AMBULATORY_CARE_PROVIDER_SITE_OTHER): Payer: PPO | Admitting: Family Medicine

## 2022-07-14 ENCOUNTER — Encounter: Payer: Self-pay | Admitting: Family Medicine

## 2022-07-14 VITALS — BP 127/62 | HR 71 | Temp 97.9°F | Resp 16 | Ht 63.0 in | Wt 164.1 lb

## 2022-07-14 DIAGNOSIS — F1020 Alcohol dependence, uncomplicated: Secondary | ICD-10-CM | POA: Diagnosis not present

## 2022-07-14 DIAGNOSIS — E785 Hyperlipidemia, unspecified: Secondary | ICD-10-CM | POA: Diagnosis not present

## 2022-07-14 DIAGNOSIS — E1169 Type 2 diabetes mellitus with other specified complication: Secondary | ICD-10-CM

## 2022-07-14 DIAGNOSIS — H409 Unspecified glaucoma: Secondary | ICD-10-CM | POA: Diagnosis not present

## 2022-07-14 DIAGNOSIS — E039 Hypothyroidism, unspecified: Secondary | ICD-10-CM | POA: Diagnosis not present

## 2022-07-14 DIAGNOSIS — H579 Unspecified disorder of eye and adnexa: Secondary | ICD-10-CM | POA: Diagnosis not present

## 2022-07-14 DIAGNOSIS — E1129 Type 2 diabetes mellitus with other diabetic kidney complication: Secondary | ICD-10-CM | POA: Diagnosis not present

## 2022-07-14 DIAGNOSIS — F17200 Nicotine dependence, unspecified, uncomplicated: Secondary | ICD-10-CM | POA: Diagnosis not present

## 2022-07-14 DIAGNOSIS — I152 Hypertension secondary to endocrine disorders: Secondary | ICD-10-CM

## 2022-07-14 DIAGNOSIS — E1159 Type 2 diabetes mellitus with other circulatory complications: Secondary | ICD-10-CM

## 2022-07-14 DIAGNOSIS — I10 Essential (primary) hypertension: Secondary | ICD-10-CM | POA: Diagnosis not present

## 2022-07-14 DIAGNOSIS — F4329 Adjustment disorder with other symptoms: Secondary | ICD-10-CM

## 2022-07-14 NOTE — Assessment & Plan Note (Signed)
Chronic, elevated ascvd risk very high; >30% On mevacor- 20 mg May need something stronger- advised based on lab reading

## 2022-07-14 NOTE — Assessment & Plan Note (Signed)
Reports 3 beers daily; reports that MD/APPs tell her to drink less but "she just likes beer" Denies concerns/withdrawal

## 2022-07-14 NOTE — Progress Notes (Signed)
Established patient visit  I,Joseline E Rosas,acting as a scribe for Gwyneth Sprout, FNP.,have documented all relevant documentation on the behalf of Gwyneth Sprout, FNP,as directed by  Gwyneth Sprout, FNP while in the presence of Gwyneth Sprout, FNP.   Patient: Heather Burke   DOB: October 19, 1951   71 y.o. Female  MRN: [id number] Visit Date: 07/14/2022  Today's healthcare provider: Gwyneth Sprout, FNP  Re Introduced to nurse practitioner role and practice setting.  All questions answered.  Discussed provider/patient relationship and expectations.   Chief Complaint  Patient presents with   Follow-up Chronic Disease   Subjective    HPI  Diabetes Mellitus Type II, follow-up  Lab Results  Component Value Date   HGBA1C 6.1 (H) 01/14/2022   HGBA1C 6.1 (A) 06/15/2021   HGBA1C 6.5 (A) 03/15/2021   Last seen for diabetes 6 months ago.  Management since then includes continuing the same treatment,Trulicity Inject 1.5 mg into the skin once a week. [place] patient assistance through Dec 2023 She reports excellent compliance with treatment. She is not having side effects.  Patient is exercising. Walks 4 days a [place]  Home blood sugar records: not being checked  Most Recent Eye Exam: Last week- she was put on Latanoprost ophthalonic .005 1 per day in each eye.  --------------------------------------------------------------------------------------------------- Lipid/Cholesterol, follow-up  Last Lipid Panel: Lab Results  Component Value Date   CHOL 161 01/14/2022   LDLCALC 81 01/14/2022   HDL 37 (L) 01/14/2022   TRIG 260 (H) 01/14/2022    She was last seen for this 6 months ago.  Management since that visit includes continue current medications.  She reports excellent compliance with treatment. She is not having side effects.   Symptoms: No appetite changes No foot ulcerations  No chest pain No chest pressure/discomfort  No dyspnea No  orthopnea  No fatigue No lower extremity edema  No palpitations No paroxysmal nocturnal dyspnea  No nausea No numbness or tingling of extremity  No polydipsia No polyuria  No speech difficulty No syncope    Last metabolic panel Lab Results  Component Value Date   GLUCOSE 91 01/14/2022   NA 141 01/14/2022   K 4.8 01/14/2022   BUN 19 01/14/2022   CREATININE 0.73 01/14/2022   EGFR 89 01/14/2022   GFRNONAA 60 12/04/2019   CALCIUM 10.1 01/14/2022   AST 28 01/14/2022   ALT 31 01/14/2022   The 10-year ASCVD risk score (Arnett DK, et al., 2019) is: 34.8%  ---------------------------------------------------------------------------------------------------  Hypothyroid, follow-up  Lab Results  Component Value Date   TSH 1.440 01/14/2022   TSH 2.060 06/17/2021   TSH 2.290 12/04/2019   FREET4 1.45 01/14/2022    Wt Readings from Last 3 Encounters:  07/14/22 164 lb 1.6 oz (74.4 kg)  04/27/22 159 lb (72.1 kg)  01/14/22 157 lb 1.6 oz (71.3 kg)    She was last seen for hypothyroid 6 months ago.  Management since that visit includes continue current medication. She reports excellent compliance with treatment. She is not having side effects.   Symptoms: No change in energy level No constipation  No diarrhea No heat / cold intolerance  No nervousness No palpitations  Yes weight changes    -----------------------------------------------------------------------------------------  Hypertension: Per patient BP reading at home are 130/60-70 and one 150'/70's  Medications: Outpatient Medications Prior to Visit  Medication Sig   albuterol (VENTOLIN HFA) 108 (90 Base) MCG/ACT inhaler TAKE  2 PUFFS BY MOUTH EVERY 6 HOURS AS NEEDED FOR WHEEZE OR SHORTNESS OF BREATH   aspirin 81 MG tablet Take 81 mg by mouth daily.   CANNABIDIOL PO Take 500 mg by mouth daily.   diltiazem (DILACOR XR) 240 MG 24 hr capsule Take 1 capsule (240 mg total) by mouth daily.   Dulaglutide (TRULICITY) 1.5 NT/6.1WE  SOPN Inject 1.5 mg into the skin once a week. [place] patient assistance through Dec 2023   fluticasone (FLONASE) 50 MCG/ACT nasal spray SPRAY 2 SPRAYS INTO EACH NOSTRIL EVERY DAY   KLOR-CON M10 10 MEQ tablet TAKE 1 TABLET BY MOUTH EVERY DAY   latanoprost (XALATAN) 0.005 % ophthalmic solution 1 drop at bedtime.   levocetirizine (XYZAL) 5 MG tablet Take 5 mg by mouth as needed.   levothyroxine (SYNTHROID) 88 MCG tablet TAKE 1 TABLET BY MOUTH EVERY DAY   losartan-hydrochlorothiazide (HYZAAR) 100-25 MG tablet TAKE 1 TABLET BY MOUTH EVERY DAY   lovastatin (MEVACOR) 20 MG tablet Take 1 tablet (20 mg total) by mouth at bedtime.   metoprolol (TOPROL-XL) 200 MG 24 hr tablet TAKE 1 TABLET BY MOUTH EVERY DAY   Misc Natural Products (T-RELIEF CBD+13 SL) Place 500 mg under the tongue daily.   Nutritional Supplements (QUINOA/KALE/HEMP PO) Place 750 mg under the tongue.   Omega 3-5-6-7-9 Fatty Acids (COMPLETE OMEGA) CAPS Take by mouth. Vitalite capsules   [DISCONTINUED] Bisacodyl (GENTLE LAXATIVE PO) Take by mouth.   [DISCONTINUED] calcium carbonate (OS-CAL) 600 MG TABS tablet Take 1 tablet (600 mg total) by mouth 2 (two) times daily with a meal.   [DISCONTINUED] Cholecalciferol (D3 ADULT PO) Take 1 tablet by mouth daily. 1000 units daily   [DISCONTINUED] Collagen-Vitamin C-Biotin (COLLAGEN 1500/C PO) Take by mouth.   [DISCONTINUED] FIBER PO Take by mouth 2 (two) times daily.   [DISCONTINUED] Omega-3 Fatty Acids (FISH OIL) 1200 MG CAPS Take by mouth daily. 2400 qam 1200 mg qpm   No facility-administered medications prior to visit.    Review of Systems  Last CBC Lab Results  Component Value Date   WBC 7.0 12/04/2019   HGB 15.1 12/04/2019   HCT 43.8 12/04/2019   MCV 95 12/04/2019   MCH 32.8 12/04/2019   RDW 12.5 12/04/2019   PLT 237 31/54/0086   Last metabolic panel Lab Results  Component Value Date   GLUCOSE 91 01/14/2022   NA 141 01/14/2022   K 4.8 01/14/2022   CL 99 01/14/2022    CO2 27 01/14/2022   BUN 19 01/14/2022   CREATININE 0.73 01/14/2022   EGFR 89 01/14/2022   CALCIUM 10.1 01/14/2022   PROT 7.8 01/14/2022   ALBUMIN 4.5 01/14/2022   LABGLOB 3.3 01/14/2022   AGRATIO 1.4 01/14/2022   BILITOT 0.3 01/14/2022   ALKPHOS 74 01/14/2022   AST 28 01/14/2022   ALT 31 01/14/2022   ANIONGAP 13 01/16/2018   Last lipids Lab Results  Component Value Date   CHOL 161 01/14/2022   HDL 37 (L) 01/14/2022   LDLCALC 81 01/14/2022   TRIG 260 (H) 01/14/2022   CHOLHDL 4.4 01/14/2022   Last hemoglobin A1c Lab Results  Component Value Date   HGBA1C 6.1 (H) 01/14/2022   Last thyroid functions Lab Results  Component Value Date   TSH 1.440 01/14/2022   Last vitamin D Lab Results  Component Value Date   VD25OH 48.2 09/18/2018       Objective    BP 127/62 Comment: home reading 7/25  Pulse 71 Comment: home  reading 7/25  Temp 97.9 F (36.6 C) (Oral)   Resp 16   Ht _0  (1.6 m)   Wt 164 lb 1.6 oz (74.4 kg)   BMI 29.07 kg/m   BP Readings from Last 3 Encounters:  07/14/22 127/62  04/27/22 136/70  01/14/22 133/64   Wt Readings from Last 3 Encounters:  07/14/22 164 lb 1.6 oz (74.4 kg)  04/27/22 159 lb (72.1 kg)  01/14/22 157 lb 1.6 oz (71.3 kg)   SpO2 Readings from Last 3 Encounters:  04/27/22 98%  01/14/22 100%  09/07/20 99%    Physical Exam Vitals and nursing note reviewed.  Constitutional:      General: She is not in acute distress.    Appearance: Normal appearance. She is overweight. She is not ill-appearing, toxic-appearing or diaphoretic.  HENT:     Head: Normocephalic and atraumatic.  Cardiovascular:     Rate and Rhythm: Normal rate and regular rhythm.     Pulses: Normal pulses.     Heart sounds: Normal heart sounds. No murmur heard.    No friction rub. No gallop.  Pulmonary:     Effort: Pulmonary effort is normal. No respiratory distress.     Breath sounds: Normal breath sounds. No stridor. No wheezing, rhonchi or rales.  Chest:      Chest wall: No tenderness.  Abdominal:     General: Bowel sounds are normal.     Palpations: Abdomen is soft.  Musculoskeletal:        General: No swelling, tenderness, deformity or signs of injury. Normal range of motion.     Right lower leg: No edema.     Left lower leg: No edema.  Skin:    General: Skin is warm and dry.     Capillary Refill: Capillary refill takes less than 2 seconds.     Coloration: Skin is not jaundiced or pale.     Findings: No bruising, erythema, lesion or rash.  Neurological:     General: No focal deficit present.     Mental Status: She is alert and oriented to person, place, and time. Mental status is at baseline.     Cranial Nerves: No cranial nerve deficit.     Sensory: No sensory deficit.     Motor: No weakness.     Coordination: Coordination normal.  Psychiatric:        Mood and Affect: Mood normal.        Behavior: Behavior normal.        Thought Content: Thought content normal.        Judgment: Judgment normal.    No results found for any visits on 07/14/22.  Assessment & Plan     Problem List Items Addressed This Visit       Cardiovascular and Mediastinum   RESOLVED: Hypertension associated with diabetes ([place])   Relevant Orders   Comprehensive metabolic panel   Hypertension associated with type 2 diabetes mellitus ([place])    Chronic, stable on home readings; elevated readings x2 in office; however, pt reports she has been under a lot of stress and often when she comes to MD office she "falls apart" Denies CP Denies SOB/ DOE Denies low blood pressure/hypotension Denies vision changes; outside of recent dx of glaucoma by Dr Gloriann Loan No LE Edema noted on exam Continue medication, Dilt XR 240, Hyzaar 100-25, Toprol 200 Denies side effects Seek emergent care if you develop chest pain or chest pressure         Endocrine  Hyperlipidemia associated with type 2 diabetes mellitus (HCC)    Chronic, elevated ascvd risk very high; >30% On  mevacor- 20 mg May need something stronger- advised based on lab reading       Relevant Medications   Omega 3-5-6-7-9 Fatty Acids (COMPLETE OMEGA) CAPS   Other Relevant Orders   Lipid panel   Hypothyroidism    Chronic, stable Repeat tsh On 88 mcg      Relevant Orders   TSH + free T4   Type 2 diabetes mellitus with other diabetic kidney complication (HCC) - Primary    Repeat A1c Repeat urine micro Continue to recommend balanced, lower carb meals. Smaller meal size, adding snacks. Choosing water as drink of choice and increasing purposeful exercise.       Relevant Orders   Hemoglobin A1c   Urine microalbumin-creatinine with uACR     Other   Alcohol dependence, daily use (HCC)    Reports 3 beers daily; reports that MD/APPs tell her to drink less but "she just likes beer" Denies concerns/withdrawal       Compulsive tobacco user syndrome    2 ppd No interested in quitting/weaning Denies low dose CT scan or pulm referral for PFTs      Glaucoma    Bilateral eyes Seen by Dr Gloriann Loan Records request sent On gtts      Relevant Medications   latanoprost (XALATAN) 0.005 % ophthalmic solution   Stress and adjustment reaction    D/t recent dx of bone loss in jaw and plan for dentures D/t recent dx of glaucoma and use of eye drops      Return in about 6 months (around 01/14/2023) for annual examination.      Vonna Kotyk, FNP, have reviewed all documentation for this visit. The documentation on 07/14/22 for the exam, diagnosis, procedures, and orders are all accurate and complete.  Gwyneth Sprout, [place] [phone] (phone) [phone] (fax)  [place]

## 2022-07-14 NOTE — Assessment & Plan Note (Signed)
Chronic, stable Repeat tsh On 88 mcg

## 2022-07-14 NOTE — Assessment & Plan Note (Signed)
2 ppd No interested in quitting/weaning Denies low dose CT scan or pulm referral for PFTs

## 2022-07-14 NOTE — Assessment & Plan Note (Deleted)
Bilateral eyes Seen by Dr Gloriann Loan Records request sent On gtts

## 2022-07-14 NOTE — Assessment & Plan Note (Signed)
D/t recent dx of bone loss in jaw and plan for dentures D/t recent dx of glaucoma and use of eye drops

## 2022-07-14 NOTE — Assessment & Plan Note (Signed)
Chronic, stable on home readings; elevated readings x2 in office; however, pt reports she has been under a lot of stress and often when she comes to MD office she "falls apart" Denies CP Denies SOB/ DOE Denies low blood pressure/hypotension Denies vision changes; outside of recent dx of glaucoma by Dr Gloriann Loan No LE Edema noted on exam Continue medication, Dilt XR 240, Hyzaar 100-25, Toprol 200 Denies side effects Seek emergent care if you develop chest pain or chest pressure

## 2022-07-14 NOTE — Assessment & Plan Note (Signed)
Repeat A1c Repeat urine micro Continue to recommend balanced, lower carb meals. Smaller meal size, adding snacks. Choosing water as drink of choice and increasing purposeful exercise.

## 2022-07-15 DIAGNOSIS — H579 Unspecified disorder of eye and adnexa: Secondary | ICD-10-CM | POA: Insufficient documentation

## 2022-07-15 LAB — COMPREHENSIVE METABOLIC PANEL
ALT: 29 IU/L (ref 0–32)
AST: 27 IU/L (ref 0–40)
Albumin/Globulin Ratio: 1.3 (ref 1.2–2.2)
Albumin: 4.4 g/dL (ref 3.9–4.9)
Alkaline Phosphatase: 81 IU/L (ref 44–121)
BUN/Creatinine Ratio: 22 (ref 12–28)
BUN: 14 mg/dL (ref 8–27)
Bilirubin Total: 0.4 mg/dL (ref 0.0–1.2)
CO2: 25 mmol/L (ref 20–29)
Calcium: 10 mg/dL (ref 8.7–10.3)
Chloride: 96 mmol/L (ref 96–106)
Creatinine, Ser: 0.63 mg/dL (ref 0.57–1.00)
Globulin, Total: 3.4 g/dL (ref 1.5–4.5)
Glucose: 96 mg/dL (ref 70–99)
Potassium: 4.1 mmol/L (ref 3.5–5.2)
Sodium: 137 mmol/L (ref 134–144)
Total Protein: 7.8 g/dL (ref 6.0–8.5)
eGFR: 95 mL/min/{1.73_m2} (ref >59)

## 2022-07-15 LAB — MICROALBUMIN / CREATININE URINE RATIO
Creatinine, Urine: 6.9 mg/dL
Microalbumin, Urine: <3 ug/mL

## 2022-07-15 LAB — HEMOGLOBIN A1C
Est. average glucose Bld gHb Est-mCnc: 131 mg/dL
Hgb A1c MFr Bld: 6.2 % — ABNORMAL HIGH (ref 4.8–5.6)

## 2022-07-15 LAB — LIPID PANEL
Chol/HDL Ratio: 5.1 ratio — ABNORMAL HIGH (ref 0.0–4.4)
Cholesterol, Total: 179 mg/dL (ref 100–199)
HDL: 35 mg/dL — ABNORMAL LOW (ref >39)
LDL Chol Calc (NIH): 75 mg/dL (ref 0–99)
Triglycerides: 432 mg/dL — ABNORMAL HIGH (ref 0–149)
VLDL Cholesterol Cal: 69 mg/dL — ABNORMAL HIGH (ref 5–40)

## 2022-07-15 LAB — TSH+FREE T4
Free T4: 1.49 ng/dL (ref 0.82–1.77)
TSH: 1.56 u[IU]/mL (ref 0.450–4.500)

## 2022-07-15 NOTE — Progress Notes (Signed)
A1c remains controlled at 6.2%. Continue to recommend balanced, lower carb meals. Smaller meal size, adding snacks. Choosing water as drink of choice and increasing purposeful exercise.  Lipds show high levels of fat, low good cholesterol. Bad cholesterol is stable. I recommend diet low in saturated fat and regular exercise - 30 min at least 5 times per week  The 10-year ASCVD risk score (Arnett DK, et al., 2019) is: 36.1%   Values used to calculate the score:     Age: 71 years     Sex: Female     Is Non-Hispanic African American: No     Diabetic: Yes     Tobacco smoker: Yes     Systolic Blood Pressure: 726 mmHg     Is BP treated: Yes     HDL Cholesterol: 35 mg/dL     Total Cholesterol: 179 mg/dL Heart attack and stroke risk is 36.1% estimated within the next 10 years which is very high. Continue to recommend smoking cessation and BP and cholesterol control to lower risk.  Other labs normal and stable.  Please let us know if you have any questions.  Thank you, Gwyneth Sprout, [address] [place], [place] [address] [phone] (phone) [phone] (fax) [place]

## 2022-07-15 NOTE — Assessment & Plan Note (Signed)
Bilateral eyes; not at level of glaucoma yet +family hx of glaucoma  Seen by Dr Gloriann Loan Records request sent On gtts

## 2022-07-19 ENCOUNTER — Telehealth: Payer: Self-pay

## 2022-07-19 NOTE — Progress Notes (Signed)
Chronic Care Management Pharmacy Assistant   Name: Heather Burke  MRN: [id number] DOB: Jul 14, 1951  Reason for Encounter: Hypertension Disease State Call  Recent office visits:  07/14/2022 [name], FNP (PCP Follow-up) for follow-up- Stopped: Bisacodyl, Calcium Carbonate 600 mg, Cholecalciferol 1000 units, Collagen-Vitamin C-Biotin, Fiber Calcium and Omega-3 Fatty Acids due to patient not taking, Lab orders placed  Recent consult visits:  None ID  Hospital visits:  None in previous 6 months  Medications: Outpatient Encounter Medications as of 07/19/2022  Medication Sig   albuterol (VENTOLIN HFA) 108 (90 Base) MCG/ACT inhaler TAKE 2 PUFFS BY MOUTH EVERY 6 HOURS AS NEEDED FOR WHEEZE OR SHORTNESS OF BREATH   aspirin 81 MG tablet Take 81 mg by mouth daily.   CANNABIDIOL PO Take 500 mg by mouth daily.   diltiazem (DILACOR XR) 240 MG 24 hr capsule Take 1 capsule (240 mg total) by mouth daily.   Dulaglutide (TRULICITY) 1.5 QM/2.5OI SOPN Inject 1.5 mg into the skin once a week. [place] patient assistance through Dec 2023   fluticasone (FLONASE) 50 MCG/ACT nasal spray SPRAY 2 SPRAYS INTO EACH NOSTRIL EVERY DAY   KLOR-CON M10 10 MEQ tablet TAKE 1 TABLET BY MOUTH EVERY DAY   latanoprost (XALATAN) 0.005 % ophthalmic solution 1 drop at bedtime.   levocetirizine (XYZAL) 5 MG tablet Take 5 mg by mouth as needed.   levothyroxine (SYNTHROID) 88 MCG tablet TAKE 1 TABLET BY MOUTH EVERY DAY   losartan-hydrochlorothiazide (HYZAAR) 100-25 MG tablet TAKE 1 TABLET BY MOUTH EVERY DAY   lovastatin (MEVACOR) 20 MG tablet Take 1 tablet (20 mg total) by mouth at bedtime.   metoprolol (TOPROL-XL) 200 MG 24 hr tablet TAKE 1 TABLET BY MOUTH EVERY DAY   Misc Natural Products (T-RELIEF CBD+13 SL) Place 500 mg under the tongue daily.   Nutritional Supplements (QUINOA/KALE/HEMP PO) Place 750 mg under the tongue.   Omega 3-5-6-7-9 Fatty Acids (COMPLETE OMEGA) CAPS Take by mouth. Vitalite capsules   No  facility-administered encounter medications on file as of 07/19/2022.   Care Gaps: Diabetic Foot Exam Diabetic Eye Exam-Per last PCP note on 07/27 the patient recently had an eye exam done the prior week and was placed on Latanoprost Ophthalonic .005 1 drop per day in eye.  Influenza Vaccine  Star Rating Drugs: Trulicity 1.5 mg patient receives this medication from [employer] [facility] Lovastatin 20 mg last filled on 06/02/2022 for a 90-Day supply with [facility] Losartan-HCTZ 100-25 mg last filled on 06/02/2022 for a 90-Day supply with [facility]  Reviewed chart prior to disease state call. Spoke with patient regarding BP  Recent Office Vitals: BP Readings from Last 3 Encounters:  07/14/22 127/62  04/27/22 136/70  01/14/22 133/64   Pulse Readings from Last 3 Encounters:  07/14/22 71  04/27/22 80  01/14/22 63    Wt Readings from Last 3 Encounters:  07/14/22 164 lb 1.6 oz (74.4 kg)  04/27/22 159 lb (72.1 kg)  01/14/22 157 lb 1.6 oz (71.3 kg)    Kidney Function Lab Results  Component Value Date/Time   CREATININE 0.63 07/14/2022 10:30 AM   CREATININE 0.73 01/14/2022 10:32 AM   GFRNONAA 60 12/04/2019 10:11 AM   GFRAA 69 12/04/2019 10:11 AM      Latest Ref Rng & Units 07/14/2022   10:30 AM 01/14/2022   10:32 AM 06/17/2021    1:38 PM  BMP  Glucose 70 - 99 mg/dL 96  91  93  BUN 8 - 27 mg/dL '14  19  18   '$ Creatinine 0.57 - 1.00 mg/dL 0.63  0.73  0.68   BUN/Creat Ratio 12 - '28 22  26  26   '$ Sodium 134 - 144 mmol/L 137  141  138   Potassium 3.5 - 5.2 mmol/L 4.1  4.8  4.1   Chloride 96 - 106 mmol/L 96  99  96   CO2 20 - 29 mmol/L '25  27  26   '$ Calcium 8.7 - 10.3 mg/dL 10.0  10.1  10.0    Current antihypertensive regimen:  Diltiazem XR 240 mg daily Losartan-HCTZ 100-25 mg daily  Metoprolol XL 200 mg daily   How often are you checking your Blood Pressure? daily Current home BP readings: 127/62  What recent interventions/DTPs have been made by  any provider to improve Blood Pressure control since last CPP Visit: Patient recently started Diltiazem XR 240 mg daily due to Diltiazem SR 120 mg being more expensive.   Any recent hospitalizations or ED visits since last visit with CPP? No  What diet changes have been made to improve Blood Pressure Control?  Patient hasn't made any recent diet changes  What exercise is being done to improve your Blood Pressure Control?  Patient walks 4 days a [place]  Adherence Review: Is the patient currently on ACE/ARB medication? Yes Does the patient have >5 day gap between last estimated fill dates? No  I spoke with the patient, and she was actually in the middle of her daily walk. Patient reports that she is doing well with no current ill symptoms. Patient advised that when she first started the Diltiazem XR 240 mg daily she was feeling a little funny, but after reading the directions she realized she should be taking this medication on an empty stomach instead of a full stomach.   Per patient since she has started taking the Diltiazem XR 240 mg on an empty stomach she is doing much better with the medication. Patient advised that her blood pressure was up and down last week, but she had a lot of dental work being done so she contributes that to her high numbers. Per patient today her number is good, and she is feeling well. Patient has no other concerns or issues at this time.   Patient has a telephone follow-up with [name], CPP on 08/26/2022 @ 1100.  Lynann Bologna, CPA/CMA Clinical Pharmacist Assistant Phone: [phone]

## 2022-08-26 ENCOUNTER — Telehealth: Payer: PPO

## 2022-09-13 ENCOUNTER — Ambulatory Visit (INDEPENDENT_AMBULATORY_CARE_PROVIDER_SITE_OTHER): Payer: PPO

## 2022-09-13 DIAGNOSIS — I152 Hypertension secondary to endocrine disorders: Secondary | ICD-10-CM

## 2022-09-13 DIAGNOSIS — E1169 Type 2 diabetes mellitus with other specified complication: Secondary | ICD-10-CM

## 2022-09-13 NOTE — Progress Notes (Signed)
Chronic Care Management Pharmacy Note  09/16/2022 Name:  Heather Burke MRN:  [id number] DOB:  1951/06/03  Summary: Patient presents for CCM follow-up. Blood pressures under better control overall, still not ready to quit smoking.   Recommendations/Changes made from today's visit: Continue current medications  Plan: CPP Follow-up 6 months  Subjective: Heather Burke is an 71 y.o. year old female who is a primary patient of Gwyneth Sprout, [place].  The CCM team was consulted for assistance with disease management and care coordination needs.    Engaged with patient by telephone for follow up visit in response to provider referral for pharmacy case management and/or care coordination services.   Consent to Services:  The patient was given information about Chronic Care Management services, agreed to services, and gave verbal consent prior to initiation of services.  Please see initial visit note for detailed documentation.   Patient Care Team: Gwyneth Sprout, FNP as PCP - General (Family Medicine) Lorelee Cover., MD as Consulting Physician (Ophthalmology) Germaine Pomfret, [facility] as Pharmacist (Pharmacist)  Recent office visits: 07/14/22: Patient presented to [name], FNP for follow-up. A1c 6.2%.  05/18/22: Video visit with [name], PA-C for cough follow-up.  04/27/22: Patient presented to [name], FNP for COPD exacerbation.   Recent consult visits: None in previous 6 months  Hospital visits: None in previous 6 months   Objective:  Lab Results  Component Value Date   CREATININE 0.63 07/14/2022   BUN 14 07/14/2022   GFRNONAA 60 12/04/2019   GFRAA 69 12/04/2019   NA 137 07/14/2022   K 4.1 07/14/2022   CALCIUM 10.0 07/14/2022   CO2 25 07/14/2022   GLUCOSE 96 07/14/2022    Lab Results  Component Value Date/Time   HGBA1C 6.2 (H) 07/14/2022 10:30 AM   HGBA1C 6.1 (H) 01/14/2022 10:32 AM   MICROALBUR Negative 09/29/2015 11:26 AM    Last diabetic Eye  exam:  Lab Results  Component Value Date/Time   HMDIABEYEEXA No Retinopathy 06/28/2021 12:00 AM    Last diabetic Foot exam: No results found for: "HMDIABFOOTEX"   Lab Results  Component Value Date   CHOL 179 07/14/2022   HDL 35 (L) 07/14/2022   LDLCALC 75 07/14/2022   TRIG 432 (H) 07/14/2022   CHOLHDL 5.1 (H) 07/14/2022       Latest Ref Rng & Units 07/14/2022   10:30 AM 01/14/2022   10:32 AM 06/17/2021    1:38 PM  Hepatic Function  Total Protein 6.0 - 8.5 g/dL 7.8  7.8  7.5   Albumin 3.9 - 4.9 g/dL 4.4  4.5  4.1   AST 0 - 40 IU/L 27  28  26    ALT 0 - 32 IU/L 29  31  34   Alk Phosphatase 44 - 121 IU/L 81  74  76   Total Bilirubin 0.0 - 1.2 mg/dL 0.4  0.3  0.4     Lab Results  Component Value Date/Time   TSH 1.560 07/14/2022 10:30 AM   TSH 1.440 01/14/2022 10:32 AM   FREET4 1.49 07/14/2022 10:30 AM   FREET4 1.45 01/14/2022 10:32 AM       Latest Ref Rng & Units 12/04/2019   10:11 AM 09/18/2018    9:12 AM 01/16/2018    8:12 AM  CBC  WBC 3.4 - 10.8 x10E3/uL 7.0  6.6  7.4   Hemoglobin 11.1 - 15.9 g/dL 15.1  15.2  14.6   Hematocrit 34.0 - 46.6 % 43.8  43.5  41.9   Platelets 150 - 450 x10E3/uL 237  241  217     Lab Results  Component Value Date/Time   VD25OH 48.2 09/18/2018 09:12 AM   VD25OH 52.7 04/20/2016 08:04 AM    Clinical ASCVD: No  The 10-year ASCVD risk score (Arnett DK, et al., 2019) is: 36.1%   Values used to calculate the score:     Age: 86 years     Sex: Female     Is Non-Hispanic African American: No     Diabetic: Yes     Tobacco smoker: Yes     Systolic Blood Pressure: 510 mmHg     Is BP treated: Yes     HDL Cholesterol: 35 mg/dL     Total Cholesterol: 179 mg/dL       07/14/2022    9:50 AM 12/16/2021    9:04 AM 03/15/2021   10:38 AM  Depression screen PHQ 2/9  Decreased Interest 0 0 0  Down, Depressed, Hopeless 0 0 0  PHQ - 2 Score 0 0 0  Altered sleeping   0  Tired, decreased energy   0  Change in appetite   0  Feeling bad or failure  about yourself    0  Trouble concentrating   0  Moving slowly or fidgety/restless   0  Suicidal thoughts   0  PHQ-9 Score   0  Difficult doing work/chores   Not difficult at all     Social History   Tobacco Use  Smoking Status Every Day   Packs/day: 2.00   Years: 49.00   Total pack years: 98.00   Types: Cigarettes  Smokeless Tobacco Never   BP Readings from Last 3 Encounters:  07/14/22 127/62  04/27/22 136/70  01/14/22 133/64   Pulse Readings from Last 3 Encounters:  07/14/22 71  04/27/22 80  01/14/22 63   Wt Readings from Last 3 Encounters:  07/14/22 164 lb 1.6 oz (74.4 kg)  04/27/22 159 lb (72.1 kg)  01/14/22 157 lb 1.6 oz (71.3 kg)   BMI Readings from Last 3 Encounters:  07/14/22 29.07 kg/m  04/27/22 28.62 kg/m  01/14/22 28.28 kg/m    Assessment/Interventions: Review of patient past medical history, allergies, medications, health status, including review of consultants reports, laboratory and other test data, was performed as part of comprehensive evaluation and provision of chronic care management services.   SDOH:  (Social Determinants of Health) assessments and interventions performed: Yes SDOH Interventions    Flowsheet Row Chronic Care Management from 02/21/2022 in [place] from 12/16/2021 in [place] Management from 11/23/2021 in [place] Management from 09/14/2021 in [place] Management from 08/10/2021 in [place] from 12/14/2020 in [place] Interventions -- Intervention Not Indicated -- -- -- --  Housing Interventions -- Intervention Not Indicated -- -- -- --  Transportation Interventions -- Intervention Not Indicated -- -- -- --  Financial Strain Interventions Intervention Not Indicated Intervention Not Indicated Intervention Not  Indicated Intervention Not Indicated Intervention Not Indicated --  Physical Activity Interventions -- Intervention Not Indicated -- -- -- Patient Refused  Stress Interventions -- Intervention Not Indicated -- -- -- --  Social Connections Interventions -- Intervention Not Indicated -- -- -- --         SDOH Screenings   Food Insecurity: No Food Insecurity (12/16/2021)  Housing: Low Risk  (12/16/2021)  Transportation Needs: No Transportation Needs (12/16/2021)  Alcohol Screen: Medium Risk (07/14/2022)  Depression (PHQ2-9): Low Risk  (07/14/2022)  Financial Resource Strain: Low Risk  (03/08/2022)  Physical Activity: Inactive (12/16/2021)  Social Connections: Socially Isolated (12/16/2021)  Stress: No Stress Concern Present (12/16/2021)  Tobacco Use: High Risk (07/14/2022)    CCM Care Plan  Allergies  Allergen Reactions   Amlodipine Nausea Only and Other (See Comments)    Stomach cramping   Atorvastatin Nausea And Vomiting   Lisinopril Cough   Pravastatin Nausea And Vomiting   Nifedipine Er Other (See Comments)    headache    Medications Reviewed Today     Reviewed by Gwyneth Sprout, FNP (Family Nurse Practitioner) on 07/14/22 at 87  Med List Status: <None>   Medication Order Taking? Sig Documenting Provider Last Dose Status Informant  albuterol (VENTOLIN HFA) 108 (90 Base) MCG/ACT inhaler [id number] Yes TAKE 2 PUFFS BY MOUTH EVERY 6 HOURS AS NEEDED FOR WHEEZE OR SHORTNESS OF Mellody Drown, FNP Taking Active   aspirin 81 MG tablet [id number] Yes Take 81 mg by mouth daily. [provider] Taking Active   CANNABIDIOL PO [id number] Yes Take 500 mg by mouth daily. [provider] Taking Active   diltiazem (DILACOR XR) 240 MG 24 hr capsule [id number] Yes Take 1 capsule (240 mg total) by mouth daily. Gwyneth Sprout, FNP Taking Active   Dulaglutide (TRULICITY) 1.5 LF/8.1OF [facility] [facility] [id number] Yes Inject 1.5 mg into the skin once a week. [place] patient  assistance through Dec 2023 Virginia Crews, MD Taking Active   fluticasone [facility]) 50 MCG/ACT nasal spray [id number] Yes SPRAY 2 SPRAYS INTO EACH NOSTRIL EVERY DAY Gwyneth Sprout, FNP Taking Active   KLOR-CON M10 10 MEQ tablet [id number] Yes TAKE 1 TABLET BY MOUTH EVERY DAY Gwyneth Sprout, FNP Taking Active   latanoprost (XALATAN) 0.005 % ophthalmic solution [id number] Yes 1 drop at bedtime. [provider] Taking Active   levocetirizine (XYZAL) 5 MG tablet [id number] Yes Take 5 mg by mouth as needed. [provider] Taking Active   levothyroxine (SYNTHROID) 88 MCG tablet [id number] Yes TAKE 1 TABLET BY MOUTH EVERY DAY Gwyneth Sprout, FNP Taking Active   losartan-hydrochlorothiazide [facility]) 100-25 MG tablet [id number] Yes TAKE 1 TABLET BY MOUTH EVERY DAY Gwyneth Sprout, FNP Taking Active   lovastatin (MEVACOR) 20 MG tablet [id number] Yes Take 1 tablet (20 mg total) by mouth at bedtime. Virginia Crews, MD Taking Active   metoprolol (TOPROL-XL) 200 MG 24 hr tablet [id number] Yes TAKE 1 TABLET BY MOUTH EVERY DAY Gwyneth Sprout, FNP Taking Active   Misc Natural Products (T-RELIEF CBD+13 SL) [id number] Yes Place 500 mg under the tongue daily. [provider] Taking Active Self  Nutritional Supplements (QUINOA/KALE/HEMP PO) [id number] Yes Place 750 mg under the tongue. [provider] Taking Active   Omega 3-5-6-7-9 Fatty Acids ([place]) CAPS [id number] Yes Take by mouth. Vitalite capsules [provider] Taking Active             Patient Active Problem List   Diagnosis Date Noted   Eye pressure 07/15/2022   Stress and adjustment reaction 07/14/2022   Sore throat 04/27/2022   Chronic pain of right knee 01/14/2022   Hypertension associated with type 2 diabetes mellitus ([place]) 01/14/2022   Need for diphtheria-tetanus-pertussis (Tdap) vaccine 01/14/2022   Encounter for subsequent annual wellness visit (AWV) in Medicare patient 01/14/2022  Alcohol dependence, daily use ([place]) 01/14/2022   Annual physical exam 01/14/2022   Hyperlipidemia associated with type 2 diabetes mellitus ([place]) 06/15/2021   Overweight 06/15/2021   Statin intolerance 12/29/2020   Benign neoplasm of sigmoid colon    Mass of right breast 02/17/2016   History of parotitis 01/05/2016   Hypothyroidism 09/29/2015   Hyperlipemia 09/29/2015   Newly recognized murmur 09/29/2015   Allergic rhinitis 08/27/2015   Type 2 diabetes mellitus with other diabetic kidney complication ([place]) 42/35/3614   Avitaminosis D 08/27/2015   Compulsive tobacco user syndrome 08/27/2015    Immunization History  Administered Date(s) Administered   Fluad Quad(high Dose 65+) 10/15/2020   Influenza Split 10/21/2010, 10/13/2012   Influenza, High Dose Seasonal PF 10/04/2018, 09/28/2021   Influenza,inj,Quad PF,6+ Mos 10/02/2014, 09/09/2015   Influenza-Unspecified 09/21/2016, 10/03/2017, 09/20/2019   PFIZER Comirnaty(Gray Top)Covid-19 Tri-Sucrose Vaccine 07/13/2021   PFIZER(Purple Top)SARS-COV-2 Vaccination 02/18/2020, 02/23/2020, 09/10/2020   Pfizer Covid-19 Vaccine Bivalent Booster 16yrs & up 12/28/2021   Pneumococcal Conjugate-13 07/21/2017   Pneumococcal Polysaccharide-23 01/22/2013, 08/23/2018   Tdap 10/21/2010, 01/14/2022   Zoster Recombinat (Shingrix) 04/27/2020, 01/27/2021   Zoster, Live 10/03/2013    Conditions to be addressed/monitored:  Hypertension, Hyperlipidemia, Diabetes, and Hypothyroidism  Care Plan : General Pharmacy (Adult)  Updates made by Germaine Pomfret, [place] since 09/16/2022 12:00 AM     Problem: Hypertension, Hyperlipidemia, Diabetes, and Hypothyroidism   Priority: High     Long-Range Goal: Patient-Specific Goal   Start Date: 08/12/2021  Expected End Date: 09/17/2023  This Visit's Progress: On track  Recent Progress: On track  Priority: High  Note:   Current Barriers:  Unable to independently afford treatment regimen  Pharmacist Clinical  Goal(s):  Patient will verbalize ability to afford treatment regimen maintain control of blood pressure as evidenced by BP less than 140/90  through collaboration with PharmD and provider.   Interventions: 1:1 collaboration with Gwyneth Sprout, FNP regarding development and update of comprehensive plan of care as evidenced by provider attestation and co-signature Inter-disciplinary care team collaboration (see longitudinal plan of care) Comprehensive medication review performed; medication list updated in electronic medical record  Hypertension (BP goal <130/80) -Controlled -History of heart murmur -Current treatment: Diltiazem XR 240 mg daily: Appropriate, Effective, Safe, Accessible Losartan-HCTZ 100-25 mg daily: Appropriate, Effective, Safe, Accessible  Metoprolol XL 200 mg daily: Appropriate, Effective, Safe, Accessible  -Medications previously tried: Amlodipine, Olmesartan, clonidine, nifedipine, Lisinopril (cough)  -Current home readings:  119/56, pulse 75  137/72  161/77  -Denies hypotensive/hypertensive symptoms -Continues walking most days.  -Recommended to continue current medication  Hyperlipidemia: (LDL goal < 100) -Controlled -Current treatment: Lovastatin 20 mg nightly  -Medications previously tried: NA  -Recommended to continue current medication  Diabetes (A1c goal <7%) -Controlled -Current medications: Trulicity 1.5 mg weekly  -Medications previously tried: NA  -Current home glucose readings fasting glucose: Not routinely monitoring  -Denies hypoglycemic/hyperglycemic symptoms -Working on weight loss via [employer]. Lost 36 pounds since starting.  -Recommended to continue current medication  Hypothyroidism (Goal: Maintain stable thyroid function) -Controlled -Current treatment  Levothyroxine 88 mcg daily  -Medications previously tried: NA  -Recommended to continue current medication  Allergic Rhinitis (Goal: Minimize  symptoms) -Controlled -Current treatment  Flonase 50 mcg/act 2 sprays nightly Xyzal 5 mg nightly   -Medications previously tried: NA -Scratchy throat, otherwise states symptoms well controlled -Continue current medications  Tobacco use (Goal Quit smoking) -Uncontrolled -Previous quit attempts: Chantix (vivid dreams, poor sleep).  -Current treatment  2 packs per day.  -  Patient smokes Within 30 minutes of waking -Patient triggers include: finishing a meal and drinking coffee and seeing someone else smoke -Patient in a pre-contemplative stage of action for tobacco cessation  -Given recent potential COPD exacerbation and ongoing tobacco use,  recommend referral to pulmonology for PFTs -Patient is overdue for lung cancer CT screening   Patient Goals/Self-Care Activities Patient will:  - check blood pressure weekly, document, and provide at future appointments  Follow Up Plan: Telephone follow up appointment with care management team member scheduled for:  04/10/2023 at 11:00 AM     Medication Assistance:  Trulicity obtained through [employer] medication assistance program.  Enrollment ends Dec 2023  Compliance/Adherence/Medication fill history: Care Gaps: Influenza  Star-Rating Drugs: Losartan-HCTZ: Last filled 03/03/22 for 90-DS  Patient's preferred pharmacy is:  [id number] [place] [id number]: [phone]: [phone]  Uses pill box? Yes Pt endorses 100% compliance  We discussed: Current pharmacy is preferred with insurance plan and patient is satisfied with pharmacy services Patient decided to: Continue current medication management strategy  Care Plan and Follow Up Patient Decision:  Patient agrees to Care Plan and Follow-up.  Plan: Telephone follow up appointment with care management team member scheduled for:  04/10/2023 at 11:00 AM   AJunius Argyle PharmD, BPara [phone]

## 2022-09-16 NOTE — Patient Instructions (Signed)
Visit Information It was great speaking with you today!  Please let me know if you have any questions about our visit.   Goals Addressed   None     Patient Care Plan: General Pharmacy (Adult)     Problem Identified: Hypertension, Hyperlipidemia, Diabetes, and Hypothyroidism   Priority: High     Long-Range Goal: Patient-Specific Goal   Start Date: 08/12/2021  Expected End Date: 09/17/2023  This Visit's Progress: On track  Recent Progress: On track  Priority: High  Note:   Current Barriers:  Unable to independently afford treatment regimen  Pharmacist Clinical Goal(s):  Patient will verbalize ability to afford treatment regimen maintain control of blood pressure as evidenced by BP less than 140/90  through collaboration with PharmD and provider.   Interventions: 1:1 collaboration with Gwyneth Sprout, FNP regarding development and update of comprehensive plan of care as evidenced by provider attestation and co-signature Inter-disciplinary care team collaboration (see longitudinal plan of care) Comprehensive medication review performed; medication list updated in electronic medical record  Hypertension (BP goal <130/80) -Controlled -History of heart murmur -Current treatment: Diltiazem XR 240 mg daily: Appropriate, Effective, Safe, Accessible Losartan-HCTZ 100-25 mg daily: Appropriate, Effective, Safe, Accessible  Metoprolol XL 200 mg daily: Appropriate, Effective, Safe, Accessible  -Medications previously tried: Amlodipine, Olmesartan, clonidine, nifedipine, Lisinopril (cough)  -Current home readings:  119/56, pulse 75  137/72  161/77  -Denies hypotensive/hypertensive symptoms -Continues walking most days.  -Recommended to continue current medication  Hyperlipidemia: (LDL goal < 100) -Controlled -Current treatment: Lovastatin 20 mg nightly  -Medications previously tried: NA  -Recommended to continue current medication  Diabetes (A1c goal <7%) -Controlled -Current  medications: Trulicity 1.5 mg weekly  -Medications previously tried: NA  -Current home glucose readings fasting glucose: Not routinely monitoring  -Denies hypoglycemic/hyperglycemic symptoms -Working on weight loss via [employer]. Lost 36 pounds since starting.  -Recommended to continue current medication  Hypothyroidism (Goal: Maintain stable thyroid function) -Controlled -Current treatment  Levothyroxine 88 mcg daily  -Medications previously tried: NA  -Recommended to continue current medication  Allergic Rhinitis (Goal: Minimize symptoms) -Controlled -Current treatment  Flonase 50 mcg/act 2 sprays nightly Xyzal 5 mg nightly   -Medications previously tried: NA -Scratchy throat, otherwise states symptoms well controlled -Continue current medications  Tobacco use (Goal Quit smoking) -Uncontrolled -Previous quit attempts: Chantix (vivid dreams, poor sleep).  -Current treatment  2 packs per day.  -Patient smokes Within 30 minutes of waking -Patient triggers include: finishing a meal and drinking coffee and seeing someone else smoke -Patient in a pre-contemplative stage of action for tobacco cessation  -Given recent potential COPD exacerbation and ongoing tobacco use,  recommend referral to pulmonology for PFTs -Patient is overdue for lung cancer CT screening   Patient Goals/Self-Care Activities Patient will:  - check blood pressure weekly, document, and provide at future appointments  Follow Up Plan: Telephone follow up appointment with care management team member scheduled for:  04/10/2023 at 11:00 AM       Patient agreed to services and verbal consent obtained.   Patient verbalizes understanding of instructions and care plan provided today and agrees to view in [place]. Active MyChart status and patient understanding of how to access instructions and care plan via MyChart confirmed with patient.     [name], PharmD, [place], CPP  Clinical Pharmacist Practitioner   [facility] [phone]

## 2022-09-17 DIAGNOSIS — E785 Hyperlipidemia, unspecified: Secondary | ICD-10-CM

## 2022-09-17 DIAGNOSIS — Z7985 Long-term (current) use of injectable non-insulin antidiabetic drugs: Secondary | ICD-10-CM | POA: Diagnosis not present

## 2022-09-17 DIAGNOSIS — F17210 Nicotine dependence, cigarettes, uncomplicated: Secondary | ICD-10-CM | POA: Diagnosis not present

## 2022-09-17 DIAGNOSIS — E039 Hypothyroidism, unspecified: Secondary | ICD-10-CM | POA: Diagnosis not present

## 2022-09-17 DIAGNOSIS — E1159 Type 2 diabetes mellitus with other circulatory complications: Secondary | ICD-10-CM

## 2022-09-19 ENCOUNTER — Telehealth: Payer: Self-pay

## 2022-09-19 NOTE — Progress Notes (Signed)
    Chronic Care Management Pharmacy Assistant   Name: Heather Burke  MRN: [id number] DOB: [birth year]  Reason for Encounter: Patient Assistance 1517 Renewal (Trulicity)  Patient is currently on patient assistance through the [employer] for her Trulicity.   Renewal application started and will be mailed to the patient's home address to complete and return to the PCP's Office along with her proof of income.  Application has been e-mailed to PTM for mailing to the patient's home address. Once patient completes her portion of the application she has been instructed to return the application and proof of income  to [facility] so it can be given to [name], CPP for faxing over to [facility] for processing.  Patient can contact me directly at [phone] if she has any questions regarding the 2024 renewal process.  Medications: Outpatient Encounter Medications as of 09/19/2022  Medication Sig   aspirin 81 MG tablet Take 81 mg by mouth daily.   CANNABIDIOL PO Take 500 mg by mouth daily.   diltiazem (DILACOR XR) 240 MG 24 hr capsule Take 1 capsule (240 mg total) by mouth daily.   Dulaglutide (TRULICITY) 1.5 YI/9.4WN SOPN Inject 1.5 mg into the skin once a week. [place] patient assistance through Dec 2023   fluticasone (FLONASE) 50 MCG/ACT nasal spray SPRAY 2 SPRAYS INTO EACH NOSTRIL EVERY DAY   KLOR-CON M10 10 MEQ tablet TAKE 1 TABLET BY MOUTH EVERY DAY   latanoprost (XALATAN) 0.005 % ophthalmic solution 1 drop at bedtime.   levocetirizine (XYZAL) 5 MG tablet Take 5 mg by mouth as needed.   levothyroxine (SYNTHROID) 88 MCG tablet TAKE 1 TABLET BY MOUTH EVERY DAY   losartan-hydrochlorothiazide (HYZAAR) 100-25 MG tablet TAKE 1 TABLET BY MOUTH EVERY DAY   lovastatin (MEVACOR) 20 MG tablet Take 1 tablet (20 mg total) by mouth at bedtime.   metoprolol (TOPROL-XL) 200 MG 24 hr tablet TAKE 1 TABLET BY MOUTH EVERY DAY   Misc Natural Products (T-RELIEF CBD+13  SL) Place 500 mg under the tongue daily.   Nutritional Supplements (QUINOA/KALE/HEMP PO) Place 750 mg under the tongue.   Omega 3-5-6-7-9 Fatty Acids (COMPLETE OMEGA) CAPS Take by mouth. Vitalite capsules   No facility-administered encounter medications on file as of 09/19/2022.    Lynann Bologna, CPA/CMA Clinical Pharmacist Assistant Phone: [phone]

## 2022-10-10 ENCOUNTER — Other Ambulatory Visit: Payer: Self-pay | Admitting: Family Medicine

## 2022-10-10 DIAGNOSIS — E1159 Type 2 diabetes mellitus with other circulatory complications: Secondary | ICD-10-CM

## 2022-10-10 DIAGNOSIS — E1169 Type 2 diabetes mellitus with other specified complication: Secondary | ICD-10-CM

## 2022-10-19 ENCOUNTER — Telehealth: Payer: Self-pay

## 2022-10-19 NOTE — Progress Notes (Signed)
Chronic Care Management Pharmacy Assistant   Name: LATIANA TOMEI  MRN: [id number] DOB: 1951/09/21  Reason for Encounter: Hypertension Disease State Call   Recent office visits:  None ID  Recent consult visits:  None ID  Hospital visits:  None in previous 6 months  Medications: Outpatient Encounter Medications as of 10/19/2022  Medication Sig   aspirin 81 MG tablet Take 81 mg by mouth daily.   CANNABIDIOL PO Take 500 mg by mouth daily.   diltiazem (DILACOR XR) 240 MG 24 hr capsule Take 1 capsule (240 mg total) by mouth daily.   Dulaglutide (TRULICITY) 1.5 FT/7.3UK SOPN Inject 1.5 mg into the skin once a week. [place] patient assistance through Dec 2023   fluticasone (FLONASE) 50 MCG/ACT nasal spray SPRAY 2 SPRAYS INTO EACH NOSTRIL EVERY DAY   KLOR-CON M10 10 MEQ tablet TAKE 1 TABLET BY MOUTH EVERY DAY   latanoprost (XALATAN) 0.005 % ophthalmic solution 1 drop at bedtime.   levocetirizine (XYZAL) 5 MG tablet Take 5 mg by mouth as needed.   levothyroxine (SYNTHROID) 88 MCG tablet TAKE 1 TABLET BY MOUTH EVERY DAY   losartan-hydrochlorothiazide (HYZAAR) 100-25 MG tablet TAKE 1 TABLET BY MOUTH EVERY DAY   lovastatin (MEVACOR) 20 MG tablet TAKE 1 TABLET BY MOUTH EVERYDAY AT BEDTIME   metoprolol (TOPROL-XL) 200 MG 24 hr tablet TAKE 1 TABLET BY MOUTH EVERY DAY   Misc Natural Products (T-RELIEF CBD+13 SL) Place 500 mg under the tongue daily.   Nutritional Supplements (QUINOA/KALE/HEMP PO) Place 750 mg under the tongue.   Omega 3-5-6-7-9 Fatty Acids (COMPLETE OMEGA) CAPS Take by mouth. Vitalite capsules   No facility-administered encounter medications on file as of 10/19/2022.   Care Gaps: Lung Cancer Screening Diabetic Foot Exam Diabetic Eye Exam  Star Rating Drugs: Trulicity 1.5 mg patient receives this medication from [facility]-Patient has been approved for the  Losartan-HCTZ 100-25 mg last filled on 09/01/2022 for a 90-Day  supply with [facility] Lovastatin 20 mg last filled on 10/10/2022 for a 90-Day supply with [facility]  Reviewed chart prior to disease state call. Spoke with patient regarding BP  Recent Office Vitals: BP Readings from Last 3 Encounters:  07/14/22 127/62  04/27/22 136/70  01/14/22 133/64   Pulse Readings from Last 3 Encounters:  07/14/22 71  04/27/22 80  01/14/22 63    Wt Readings from Last 3 Encounters:  07/14/22 164 lb 1.6 oz (74.4 kg)  04/27/22 159 lb (72.1 kg)  01/14/22 157 lb 1.6 oz (71.3 kg)    Kidney Function Lab Results  Component Value Date/Time   CREATININE 0.63 07/14/2022 10:30 AM   CREATININE 0.73 01/14/2022 10:32 AM   GFRNONAA 60 12/04/2019 10:11 AM   GFRAA 69 12/04/2019 10:11 AM       Latest Ref Rng & Units 07/14/2022   10:30 AM 01/14/2022   10:32 AM 06/17/2021    1:38 PM  BMP  Glucose 70 - 99 mg/dL 96  91  93   BUN 8 - 27 mg/dL '14  19  18   '$ Creatinine 0.57 - 1.00 mg/dL 0.63  0.73  0.68   BUN/Creat Ratio 12 - '28 22  26  26   '$ Sodium 134 - 144 mmol/L 137  141  138   Potassium 3.5 - 5.2 mmol/L 4.1  4.8  4.1   Chloride 96 - 106 mmol/L 96  99  96   CO2 20 - 29 mmol/L 25  27  26   Calcium 8.7 - 10.3 mg/dL 10.0  10.1  10.0    Current antihypertensive regimen:  Diltiazem XR 240 mg daily Losartan-HCTZ 100-25 mg daily  Metoprolol XL 200 mg daily  How often are you checking your Blood Pressure? several times per month Current home BP readings: Patient stated that she can't remember her last reading number exactly but she does know it was normal  What recent interventions/DTPs have been made by any provider to improve Blood Pressure control since last CPP Visit: None ID  Any recent hospitalizations or ED visits since last visit with CPP? No What diet changes have been made to improve Blood Pressure Control?  No changes have been to patients diet.  Adherence Review: Is the patient currently on ACE/ARB medication? Yes Does the patient have >5 day gap  between last estimated fill dates? No   Patient has a follow-up telephone appointment with [name], CPP on 04/10/2023 @ 1100.  Lynann Bologna, CPA/CMA Clinical Pharmacist Assistant Phone: [phone]

## 2022-11-07 ENCOUNTER — Encounter: Payer: Self-pay | Admitting: Family Medicine

## 2022-11-08 ENCOUNTER — Other Ambulatory Visit: Payer: Self-pay | Admitting: Family Medicine

## 2022-11-08 ENCOUNTER — Other Ambulatory Visit: Payer: Self-pay

## 2022-11-08 DIAGNOSIS — Z1231 Encounter for screening mammogram for malignant neoplasm of breast: Secondary | ICD-10-CM

## 2022-11-08 DIAGNOSIS — I152 Hypertension secondary to endocrine disorders: Secondary | ICD-10-CM

## 2022-11-08 NOTE — Progress Notes (Signed)
Created in error

## 2022-11-17 ENCOUNTER — Other Ambulatory Visit: Payer: Self-pay | Admitting: Family Medicine

## 2022-11-17 DIAGNOSIS — I152 Hypertension secondary to endocrine disorders: Secondary | ICD-10-CM

## 2022-11-17 DIAGNOSIS — E1169 Type 2 diabetes mellitus with other specified complication: Secondary | ICD-10-CM

## 2022-11-17 NOTE — Telephone Encounter (Signed)
Requested Prescriptions  Pending Prescriptions Disp Refills   DILT-XR 240 MG 24 hr capsule [Pharmacy Med Name: DILT XR 240 MG CAPSULE] 90 capsule 0    Sig: TAKE 1 CAPSULE BY MOUTH EVERY DAY     Cardiovascular: Calcium Channel Blockers 3 Passed - 11/17/2022  1:15 PM      Passed - ALT in normal range and within 360 days    ALT  Date Value Ref Range Status  07/14/2022 29 0 - 32 IU/L Final         Passed - AST in normal range and within 360 days    AST  Date Value Ref Range Status  07/14/2022 27 0 - 40 IU/L Final         Passed - Cr in normal range and within 360 days    Creatinine, Ser  Date Value Ref Range Status  07/14/2022 0.63 0.57 - 1.00 mg/dL Final         Passed - Last BP in normal range    BP Readings from Last 1 Encounters:  07/14/22 127/62         Passed - Last Heart Rate in normal range    Pulse Readings from Last 1 Encounters:  07/14/22 71         Passed - Valid encounter within last 6 months    Recent Outpatient Visits           4 months ago Type 2 diabetes mellitus with other diabetic kidney complication [facility])   [facility] [name] T, FNP   6 months ago Chronic cough   [facility] [place], [place], PA-C   6 months ago COPD exacerbation [facility])   [facility] [name] T, FNP   10 months ago Annual physical exam   [facility] [name] T, FNP   1 year ago Type 2 diabetes mellitus with other specified complication, without long-term current use of insulin ([place])   [facility] [place], Dionne Bucy, MD       Future Appointments             In 2 months Gwyneth Sprout, [place], P[facility]             lovastatin (MEVACOR) 20 MG tablet [Pharmacy Med Name: LOVASTATIN 20 MG TABLET] 90 tablet 0    Sig: TAKE 1 TABLET BY MOUTH EVERYDAY AT BEDTIME     Cardiovascular:  Antilipid - Statins 2 Failed - 11/17/2022  1:15 PM      Failed - Lipid Panel in normal range  within the last 12 months    Cholesterol, Total  Date Value Ref Range Status  07/14/2022 179 100 - 199 mg/dL Final   LDL Chol Calc (NIH)  Date Value Ref Range Status  07/14/2022 75 0 - 99 mg/dL Final   HDL  Date Value Ref Range Status  07/14/2022 35 (L) >39 mg/dL Final   Triglycerides  Date Value Ref Range Status  07/14/2022 432 (H) 0 - 149 mg/dL Final         Passed - Cr in normal range and within 360 days    Creatinine, Ser  Date Value Ref Range Status  07/14/2022 0.63 0.57 - 1.00 mg/dL Final         Passed - Patient is not pregnant      Passed - Valid encounter within last 12 months    Recent Outpatient Visits  4 months ago Type 2 diabetes mellitus with other diabetic kidney complication [facility])   [facility] Gwyneth Sprout, FNP   6 months ago Chronic cough   [facility], [place], PA-C   6 months ago COPD exacerbation [facility] [place])   [facility] Gwyneth Sprout, FNP   10 months ago Annual physical exam   [facility] [name] T, FNP   1 year ago Type 2 diabetes mellitus with other specified complication, without long-term current use of insulin [facility])   [facility], Dionne Bucy, MD       Future Appointments             In 2 months Gwyneth Sprout, [place], [place]

## 2022-11-18 ENCOUNTER — Telehealth: Payer: Self-pay

## 2022-11-18 NOTE — Progress Notes (Signed)
Chronic Care Management Pharmacy Assistant   Name: Heather Burke  MRN: [id number] DOB: 08-24-1951  Reason for Encounter: Hypertension Disease State Call   Recent office visits:  None ID  Recent consult visits:  None ID  Hospital visits:  None in previous 6 months  Medications: Outpatient Encounter Medications as of 11/18/2022  Medication Sig   aspirin 81 MG tablet Take 81 mg by mouth daily.   CANNABIDIOL PO Take 500 mg by mouth daily.   diltiazem (DILT-XR) 240 MG 24 hr capsule TAKE 1 CAPSULE BY MOUTH EVERY DAY   Dulaglutide (TRULICITY) 1.5 CL/2.7NT SOPN Inject 1.5 mg into the skin once a week. [place] patient assistance through Dec 2023   fluticasone (FLONASE) 50 MCG/ACT nasal spray SPRAY 2 SPRAYS INTO EACH NOSTRIL EVERY DAY   KLOR-CON M10 10 MEQ tablet TAKE 1 TABLET BY MOUTH EVERY DAY   latanoprost (XALATAN) 0.005 % ophthalmic solution 1 drop at bedtime.   levocetirizine (XYZAL) 5 MG tablet Take 5 mg by mouth as needed.   levothyroxine (SYNTHROID) 88 MCG tablet TAKE 1 TABLET BY MOUTH EVERY DAY   losartan-hydrochlorothiazide (HYZAAR) 100-25 MG tablet TAKE 1 TABLET BY MOUTH EVERY DAY   lovastatin (MEVACOR) 20 MG tablet TAKE 1 TABLET BY MOUTH EVERYDAY AT BEDTIME   metoprolol (TOPROL-XL) 200 MG 24 hr tablet TAKE 1 TABLET BY MOUTH EVERY DAY   Misc Natural Products (T-RELIEF CBD+13 SL) Place 500 mg under the tongue daily.   Nutritional Supplements (QUINOA/KALE/HEMP PO) Place 750 mg under the tongue.   Omega 3-5-6-7-9 Fatty Acids (COMPLETE OMEGA) CAPS Take by mouth. Vitalite capsules   No facility-administered encounter medications on file as of 11/18/2022.   Care Gaps: Lung Cancer Screening Diabetic Foot Exam Diabetic Eye Exam Medicare Annual Wellness Exam   Star Rating Drugs: Trulicity 1.5 mg patient receives this medication from [place] [facility]-Patient has been approved for the  Losartan-HCTZ 100-25 mg last filled on 09/01/2022  for a [facility]-Day supply with [facility] Lovastatin 20 mg last filled on 10/10/2022 for a 90-Day supply with [facility]  Reviewed chart prior to disease state call. Spoke with patient regarding BP  Recent Office Vitals: BP Readings from Last 3 Encounters:  07/14/22 127/62  04/27/22 136/70  01/14/22 133/64   Pulse Readings from Last 3 Encounters:  07/14/22 71  04/27/22 80  01/14/22 63    Wt Readings from Last 3 Encounters:  07/14/22 164 lb 1.6 oz (74.4 kg)  04/27/22 159 lb (72.1 kg)  01/14/22 157 lb 1.6 oz (71.3 kg)    Kidney Function Lab Results  Component Value Date/Time   CREATININE 0.63 07/14/2022 10:30 AM   CREATININE 0.73 01/14/2022 10:32 AM   GFRNONAA 60 12/04/2019 10:11 AM   GFRAA 69 12/04/2019 10:11 AM      Latest Ref Rng & Units 07/14/2022   10:30 AM 01/14/2022   10:32 AM 06/17/2021    1:38 PM  BMP  Glucose 70 - 99 mg/dL 96  91  93   BUN 8 - 27 mg/dL '14  19  18   '$ Creatinine 0.57 - 1.00 mg/dL 0.63  0.73  0.68   BUN/Creat Ratio 12 - '28 22  26  26   '$ Sodium 134 - 144 mmol/L 137  141  138   Potassium 3.5 - 5.2 mmol/L 4.1  4.8  4.1   Chloride 96 - 106 mmol/L 96  99  96   CO2 20 - 29 mmol/L 25  27  26   Calcium 8.7 - 10.3 mg/dL 10.0  10.1  10.0    Current antihypertensive regimen:  Diltiazem XR 240 mg daily Losartan-HCTZ 100-25 mg daily  Metoprolol XL 200 mg daily  How often are you checking your Blood Pressure? several times per month  Current home BP readings: Patient states her BP has been normal.   What recent interventions/DTPs have been made by any provider to improve Blood Pressure control since last CPP Visit: None ID  Any recent hospitalizations or ED visits since last visit with CPP? No  What diet changes have been made to improve Blood Pressure Control?  No new changes  Adherence Review: Is the patient currently on ACE/ARB medication? No Does the patient have >5 day gap between last estimated fill dates? No  Patient has a follow-up telephone  appointment with [name], CPP on 04/10/2023 @ 1100.   Lynann Bologna, CPA/CMA Clinical Pharmacist Assistant Phone: [phone]

## 2022-12-01 ENCOUNTER — Other Ambulatory Visit: Payer: Self-pay | Admitting: Family Medicine

## 2022-12-01 DIAGNOSIS — R921 Mammographic calcification found on diagnostic imaging of breast: Secondary | ICD-10-CM

## 2022-12-01 DIAGNOSIS — I152 Hypertension secondary to endocrine disorders: Secondary | ICD-10-CM

## 2022-12-22 ENCOUNTER — Ambulatory Visit (INDEPENDENT_AMBULATORY_CARE_PROVIDER_SITE_OTHER): Payer: PPO

## 2022-12-22 VITALS — Wt 164.0 lb

## 2022-12-22 DIAGNOSIS — Z0000 Encounter for general adult medical examination without abnormal findings: Secondary | ICD-10-CM

## 2022-12-22 NOTE — Progress Notes (Signed)
Virtual Visit via Telephone Note  I connected with  Heather Burke on 12/22/22 at  1:00 PM EST by telephone and verified that I am speaking with the correct person using two identifiers.  Location: Patient: home Provider: [facility] Persons participating in the virtual visit: Heather Burke   I discussed the limitations, risks, security and privacy concerns of performing an evaluation and management service by telephone and the availability of in person appointments. The patient expressed understanding and agreed to proceed.  Interactive audio and video telecommunications were attempted between this nurse and patient, however failed, due to patient having technical difficulties OR patient did not have access to video capability.  We continued and completed visit with audio only.  Some vital signs may be absent or patient reported.   Heather David, LPN  Subjective:   Heather Burke is a 72 y.o. female who presents for Medicare Annual (Subsequent) preventive examination.  Review of Systems     Cardiac Risk Factors include: advanced age (>41mn, >>77women);diabetes mellitus;hypertension     Objective:    There were no vitals filed for this visit. There is no height or weight on file to calculate BMI.     12/22/2022    1:11 PM 12/16/2021    9:06 AM 12/14/2020   11:18 AM 08/23/2018   10:38 AM 01/16/2018    8:36 AM 07/21/2017   10:32 AM 07/07/2017   10:42 AM  Advanced Directives  Does Patient Have a Medical Advance Directive? No No No Yes No No No  Type of Advance Directive    [place]?    No - copy requested     Would patient like information on creating a medical advance directive? No - Patient declined No - Patient declined No - Patient declined   No - Patient declined     Current Medications (verified) Outpatient Encounter Medications as of 12/22/2022  Medication Sig   aspirin 81 MG tablet Take  81 mg by mouth daily.   CANNABIDIOL PO Take 500 mg by mouth daily.   diltiazem (DILT-XR) 240 MG 24 hr capsule TAKE 1 CAPSULE BY MOUTH EVERY DAY   Dulaglutide (TRULICITY) 1.5 MWC/3.7SESOPN Inject 1.5 mg into the skin once a week. [place] assistance through Dec 2023   fluticasone (FLONASE) 50 MCG/ACT nasal spray SPRAY 2 SPRAYS INTO EACH NOSTRIL EVERY DAY   KLOR-CON M10 10 MEQ tablet TAKE 1 TABLET BY MOUTH EVERY DAY   latanoprost (XALATAN) 0.005 % ophthalmic solution 1 drop at bedtime.   levothyroxine (SYNTHROID) 88 MCG tablet TAKE 1 TABLET BY MOUTH EVERY DAY   losartan-hydrochlorothiazide (HYZAAR) 100-25 MG tablet TAKE 1 TABLET BY MOUTH EVERY DAY   lovastatin (MEVACOR) 20 MG tablet TAKE 1 TABLET BY MOUTH EVERYDAY AT BEDTIME   metoprolol (TOPROL-XL) 200 MG 24 hr tablet TAKE 1 TABLET BY MOUTH EVERY DAY   Misc Natural Products (T-RELIEF CBD+13 SL) Place 500 mg under the tongue daily.   Nutritional Supplements (QUINOA/KALE/HEMP PO) Place 750 mg under the tongue.   Omega 3-5-6-7-9 Fatty Acids (COMPLETE OMEGA) CAPS Take by mouth. Vitalite capsules   levocetirizine (XYZAL) 5 MG tablet Take 5 mg by mouth as needed. (Patient not taking: Reported on 12/22/2022)   No facility-administered encounter medications on file as of 12/22/2022.    Allergies (verified) Amlodipine, Atorvastatin, Lisinopril, Pravastatin, and Nifedipine er   History: Past Medical History:  Diagnosis  Date   Breast cancer ([place]) 06/08/2015   T1c, N0;  ER+,PR+, Her 2 neg (FISH neg) x2., SLN x 3 negative. Mammoprint: Low risk. Multifocal.Invasive mammary and lobular carcinoma.    Diabetes mellitus without complication (HCC)    GERD (gastroesophageal reflux disease)    Heart murmur 2016   newly diagnosed, slight murmur   Hyperlipidemia    Hypertension    Hypothyroidism    Lupus (HCC)    PONV (postoperative nausea and vomiting)    with Hysterectomy   Thyroid disease    Past Surgical History:  Procedure Laterality  Date   ABDOMINAL HYSTERECTOMY     BREAST BIOPSY Right 2002   negative/ Dr. Bary Castilla   BREAST BIOPSY Left 06-08-15   INVASIVE MAMMARY CARCINOMA WITH PARTIAL SOLID PATTERN.    BREAST BIOPSY Right 06/26/2018   FIBROADENOMATOUS CHANGES WITH STROMAL HYALINIZATION AND CALCIFICATIONS   COLONOSCOPY WITH PROPOFOL N/A 09/13/2016   Procedure: COLONOSCOPY WITH PROPOFOL;  Surgeon: Lucilla Lame, MD;  Location: [facility] ENDOSCOPY;  Service: Endoscopy;  Laterality: N/A;   core biopsy Left 06/08/15   GUM SURGERY  2006   MASTECTOMY Left 2016   OOPHORECTOMY     SENTINEL NODE BIOPSY Left 08/18/2015   Procedure: SENTINEL NODE BIOPSY;  Surgeon: [name], MD;  Location: [facility] ORS;  Service: General;  Laterality: Left;   SIMPLE MASTECTOMY WITH AXILLARY SENTINEL NODE BIOPSY Left 08/18/2015   Procedure: SIMPLE MASTECTOMY;  Surgeon: [name], MD;  Location: [facility] ORS;  Service: General;  Laterality: Left;   TONSILLECTOMY     Family History  Problem Relation Age of Onset   Cancer Father        prostate   Thyroid disease Daughter    Heart attack Sister    Breast cancer Cousin    Breast cancer Cousin    Breast cancer Other    Social History   Socioeconomic History   Marital status: Widowed    Spouse name: Not on file   Number of children: 2   Years of education: H/S   Highest education level: High school graduate  Occupational History   Occupation: Part-Time    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  Tobacco Use   Smoking status: Every Day    Packs/day: 2.00    Years: 49.00    Total pack years: 98.00    Types: Cigarettes   Smokeless tobacco: Never  Vaping Use   Vaping Use: Never used  Substance and Sexual Activity   Alcohol use: Yes    Alcohol/week: 14.0 - 28.0 standard drinks of alcohol    Types: 14 - 28 Cans of beer per week    Comment: 2-3 beers a day - declined cutting back anymore   Drug use: No   Sexual activity: Not on file  Other Topics Concern   Not on file  Social  History Narrative   Not on file   Social Determinants of Health   Financial Resource Strain: Low Risk  (12/22/2022)   Overall Financial Resource Strain (CARDIA)    Difficulty of Paying Living Expenses: Not hard at all  Food Insecurity: No Food Insecurity (12/22/2022)   Hunger Vital Sign    Worried About Running Out of Food in the Last Year: Never true    Ran Out of Food in the Last Year: Never true  Transportation Needs: No Transportation Needs (12/22/2022)   PRAPARE - Hydrologist (Medical): No    Lack of Transportation (  Non-Medical): No  Physical Activity: Insufficiently Active (12/22/2022)   Exercise Vital Sign    Days of Exercise per Week: 4 days    Minutes of Exercise per Session: 30 min  Stress: No Stress Concern Present (12/22/2022)   [place]    Feeling of Stress : Not at all  Social Connections: Socially Isolated (12/22/2022)   Social Connection and Isolation Panel [NHANES]    Frequency of Communication with Friends and Family: More than three times a week    Frequency of Social Gatherings with Friends and Family: More than three times a week    Attends Religious Services: Never    Marine scientist or Organizations: No    Attends Archivist Meetings: Never    Marital Status: Widowed    Tobacco Counseling Ready to quit: Not Answered Counseling given: Not Answered   Clinical Intake:  Pre-visit preparation completed: Yes  Pain : No/denies pain     Nutritional Risks: None Diabetes: Yes CBG done?: No Did pt. bring in CBG monitor from home?: No  How often do you need to have someone help you when you read instructions, pamphlets, or other written materials from your doctor or pharmacy?: 1 - Never  Diabetic?yes Nutrition Risk Assessment:  Has the patient had any N/V/D within the last 2 months?  No  Does the patient have any non-healing wounds?  No  Has the  patient had any unintentional weight loss or weight gain?  No   Diabetes:  Is the patient diabetic?  Yes  If diabetic, was a CBG obtained today?  No  Did the patient bring in their glucometer from home?  No  How often do you monitor your CBG's? never.   Financial Strains and Diabetes Management:  Are you having any financial strains with the device, your supplies or your medication? No .  Does the patient want to be seen by Chronic Care Management for management of their diabetes?  No  Would the patient like to be referred to a Nutritionist or for Diabetic Management?  No   Diabetic Exams:  Diabetic Eye Exam: Completed 06/28/21. Overdue for diabetic eye exam. Pt has been advised about the importance in completing this exam.  Diabetic Foot Exam: Completed 06/15/21. Pt has been advised about the importance in completing this exam.    Interpreter Needed?: No  Information entered by :: Kirke Shaggy, LPN   Activities of Daily Living    12/22/2022    1:11 PM 07/14/2022    9:50 AM  In your present state of health, do you have any difficulty performing the following activities:  Hearing? 0 0  Vision? 0 0  Difficulty concentrating or making decisions? 0 0  Walking or climbing stairs? 0 0  Dressing or bathing? 0 0  Doing errands, shopping? 0 0  Preparing Food and eating ? N   Using the Toilet? N   In the past six months, have you accidently leaked urine? N   Do you have problems with loss of bowel control? N   Managing your Medications? N   Managing your Finances? N   Housekeeping or managing your Housekeeping? N     Patient Care Team: Gwyneth Sprout, FNP as PCP - General (Family Medicine) Lorelee Cover., MD as Consulting Physician (Ophthalmology) Germaine Pomfret, [facility] as Pharmacist (Pharmacist)  Indicate any recent Medical Services you may have received from other than [facility] providers in the past year (  date may be approximate).     Assessment:   This is a routine  wellness examination for [facility].  Hearing/Vision screen Hearing Screening - Comments:: No aids Vision Screening - Comments:: Wears glasses- Dr. Gloriann Loan  Dietary issues and exercise activities discussed: Current Exercise Habits: Home exercise routine, Type of exercise: walking, Time (Minutes): 30, Frequency (Times/Week): 4, Weekly Exercise (Minutes/Week): 120, Intensity: Mild, Exercise limited by: None identified   Goals Addressed             This Visit's Progress    DIET - EAT MORE FRUITS AND VEGETABLES         Depression Screen    12/22/2022    1:09 PM 07/14/2022    9:50 AM 12/16/2021    9:04 AM 03/15/2021   10:38 AM 12/14/2020   11:16 AM 06/04/2020    9:54 AM 08/23/2018   10:38 AM  PHQ 2/9 Scores  PHQ - 2 Score 0 0 0 0 0 0 0  PHQ- 9 Score 0   0       Fall Risk    12/22/2022    1:11 PM 07/14/2022    9:50 AM 12/16/2021    9:07 AM 03/15/2021   10:38 AM 12/14/2020   11:19 AM  Fall Risk   Falls in the past year? 0 0 0 0 0  Number falls in past yr: 0 0 0 0 0  Injury with Fall? 0 0 0 0 0  Risk for fall due to : No Fall Risks  No Fall Risks No Fall Risks   Follow up Falls prevention discussed;Falls evaluation completed  Falls evaluation completed Falls evaluation completed     FALL RISK PREVENTION PERTAINING TO THE HOME:  Any stairs in or around the home? Yes  If so, are there any without handrails? No  Home free of loose throw rugs in walkways, pet beds, electrical cords, etc? Yes  Adequate lighting in your home to reduce risk of falls? Yes   ASSISTIVE DEVICES UTILIZED TO PREVENT FALLS:  Life alert? No  Use of a cane, walker or w/c? No  Grab bars in the bathroom? No  Shower chair or bench in shower? No  Elevated toilet seat or a handicapped toilet? No    Cognitive Function:        12/22/2022    1:14 PM 12/04/2019    9:17 AM  6CIT Screen  What Year? 0 points 0 points  What month? 0 points 0 points  What time? 0 points 0 points  Count back from 20 0 points 0  points  Months in reverse 0 points 0 points  Repeat phrase 2 points 2 points  Total Score 2 points 2 points    Immunizations Immunization History  Administered Date(s) Administered   Fluad Quad(high Dose 65+) 10/15/2020   Influenza Inj Mdck Quad Pf 10/12/2022   Influenza Split 10/21/2010, 10/13/2012   Influenza, High Dose Seasonal PF 10/04/2018, 09/28/2021   Influenza,inj,Quad PF,6+ Mos 10/02/2014, 09/09/2015   Influenza-Unspecified 09/21/2016, 10/03/2017, 09/20/2019   PFIZER Comirnaty(Gray Top)Covid-19 Tri-Sucrose Vaccine 07/13/2021   PFIZER(Purple Top)SARS-COV-2 Vaccination 02/18/2020, 02/23/2020, 09/10/2020   Pfizer Covid-19 Vaccine Bivalent Booster 80yr & up 12/28/2021   Pneumococcal Conjugate-13 07/21/2017   Pneumococcal Polysaccharide-23 01/22/2013, 08/23/2018   RSV,unspecified 12/02/2022   Tdap 10/21/2010, 01/14/2022   Zoster Recombinat (Shingrix) 04/27/2020, 01/27/2021   Zoster, Live 10/03/2013    TDAP status: Up to date  Flu Vaccine status: Up to date  Pneumococcal vaccine status: Up to date  Covid-19  vaccine status: Completed vaccines  Qualifies for Shingles Vaccine? Yes   Zostavax completed Yes   Shingrix Completed?: Yes  Screening Tests Health Maintenance  Topic Date Due   Lung Cancer Screening  07/11/2019   FOOT EXAM  06/15/2022   OPHTHALMOLOGY EXAM  06/28/2022   COVID-19 Vaccine (6 - 2023-24 season) 08/19/2022   HEMOGLOBIN A1C  01/14/2023   Diabetic kidney evaluation - eGFR measurement  07/15/2023   Diabetic kidney evaluation - Urine ACR  07/15/2023   Medicare Annual Wellness (AWV)  12/23/2023   MAMMOGRAM  05/03/2024   COLONOSCOPY (Pts 45-31yr Insurance coverage will need to be confirmed)  09/13/2026   DTaP/Tdap/Td (3 - Td or Tdap) 01/15/2032   Pneumonia Vaccine 72 Years old  Completed   INFLUENZA VACCINE  Completed   DEXA SCAN  Completed   Hepatitis C Screening  Completed   Zoster Vaccines- Shingrix  Completed   HPV VACCINES  Aged Out     Health Maintenance  Health Maintenance Due  Topic Date Due   Lung Cancer Screening  07/11/2019   FOOT EXAM  06/15/2022   OPHTHALMOLOGY EXAM  06/28/2022   COVID-19 Vaccine (6 - 2023-24 season) 08/19/2022    Colorectal cancer screening: Type of screening: Colonoscopy. Completed 09/13/16. Repeat every 10 years  Mammogram status: Completed 05/26/22. Repeat every year  Bone Density status: Completed 01/07/16. Results reflect: Bone density results: NORMAL. Repeat every 5 years.- declined referral for now  Lung Cancer Screening: (Low Dose CT Chest recommended if Age 71-80years, 30 pack-year currently smoking OR have quit w/in 15years.) does not qualify.   Additional Screening:  Hepatitis C Screening: does qualify; Completed 02/05/13  Vision Screening: Recommended annual ophthalmology exams for early detection of glaucoma and other disorders of the eye. Is the patient up to date with their annual eye exam?  Yes  Who is the provider or what is the name of the office in which the patient attends annual eye exams? Dr.Bell If pt is not established with a provider, would they like to be referred to a provider to establish care? No .   Dental Screening: Recommended annual dental exams for proper oral hygiene  Community Resource Referral / Chronic Care Management: CRR required this visit?  No   CCM required this visit?  No      Plan:     I have personally reviewed and noted the following in the patient's chart:   Medical and social history Use of alcohol, tobacco or illicit drugs  Current medications and supplements including opioid prescriptions. Patient is not currently taking opioid prescriptions. Functional ability and status Nutritional status Physical activity Advanced directives List of other physicians Hospitalizations, surgeries, and ER visits in previous 12 months Vitals Screenings to include cognitive, depression, and falls Referrals and appointments  In addition,  I have reviewed and discussed with patient certain preventive protocols, quality metrics, and best practice recommendations. A written personalized care plan for preventive services as well as general preventive health recommendations were provided to patient.     LDionisio David LPN   09/22/1832  Nurse Notes: none

## 2022-12-22 NOTE — Patient Instructions (Signed)
Heather Burke , Thank you for taking time to come for your Medicare Wellness Visit. I appreciate your ongoing commitment to your health goals. Please review the following plan we discussed and let me know if I can assist you in the future.   Screening recommendations/referrals: Colonoscopy: 09/13/16 Mammogram: 05/26/22 Bone Density: 01/07/16 Recommended yearly ophthalmology/optometry visit for glaucoma screening and checkup Recommended yearly dental visit for hygiene and checkup  Vaccinations: Influenza vaccine: 09/28/21, 12/28/21 Pneumococcal vaccine: 08/23/18 Tdap vaccine: 01/14/22 Shingles vaccine: Zostavax 10/03/13   Shingrix  04/27/20, 01/27/21   Covid-19:02/18/20, 02/23/20, 09/10/20, 07/13/21, 12/28/21  Advanced directives: no  Conditions/risks identified: none  Next appointment: Follow up in one year for your annual wellness visit 12/26/23 @ 11 am by phone   Preventive Care 65 Years and Older, Female Preventive care refers to lifestyle choices and visits with your health care provider that can promote health and wellness. What does preventive care include? A yearly physical exam. This is also called an annual well check. Dental exams once or twice a year. Routine eye exams. Ask your health care provider how often you should have your eyes checked. Personal lifestyle choices, including: Daily care of your teeth and gums. Regular physical activity. Eating a healthy diet. Avoiding tobacco and drug use. Limiting alcohol use. Practicing safe sex. Taking low-dose aspirin every day. Taking vitamin and mineral supplements as recommended by your health care provider. What happens during an annual well check? The services and screenings done by your health care provider during your annual well check will depend on your age, overall health, lifestyle risk factors, and family history of disease. Counseling  Your health care provider may ask you questions about your: Alcohol use. Tobacco use. Drug  use. Emotional well-being. Home and relationship well-being. Sexual activity. Eating habits. History of falls. Memory and ability to understand (cognition). Work and work Statistician. Reproductive health. Screening  You may have the following tests or measurements: Height, weight, and BMI. Blood pressure. Lipid and cholesterol levels. These may be checked every 5 years, or more frequently if you are over 46 years old. Skin check. Lung cancer screening. You may have this screening every year starting at age 47 if you have a 30-pack-year history of smoking and currently smoke or have quit within the past 15 years. Fecal occult blood test (FOBT) of the stool. You may have this test every year starting at age 35. Flexible sigmoidoscopy or colonoscopy. You may have a sigmoidoscopy every 5 years or a colonoscopy every 10 years starting at age 80. Hepatitis C blood test. Hepatitis B blood test. Sexually transmitted disease (STD) testing. Diabetes screening. This is done by checking your blood sugar (glucose) after you have not eaten for a while (fasting). You may have this done every 1-3 years. Bone density scan. This is done to screen for osteoporosis. You may have this done starting at age 61. Mammogram. This may be done every 1-2 years. Talk to your health care provider about how often you should have regular mammograms. Talk with your health care provider about your test results, treatment options, and if necessary, the need for more tests. Vaccines  Your health care provider may recommend certain vaccines, such as: Influenza vaccine. This is recommended every year. Tetanus, diphtheria, and acellular pertussis (Tdap, Td) vaccine. You may need a Td booster every 10 years. Zoster vaccine. You may need this after age 56. Pneumococcal 13-valent conjugate (PCV13) vaccine. One dose is recommended after age 57. Pneumococcal polysaccharide (PPSV23) vaccine. One dose  is recommended after age  53. Talk to your health care provider about which screenings and vaccines you need and how often you need them. This information is not intended to replace advice given to you by your health care provider. Make sure you discuss any questions you have with your health care provider. Document Released: 01/01/2016 Document Revised: 08/24/2016 Document Reviewed: 10/06/2015 Elsevier Interactive Patient Education  2017 [place] Prevention in the Home Falls can cause injuries. They can happen to people of all ages. There are many things you can do to make your home safe and to help prevent falls. What can I do on the outside of my home? Regularly fix the edges of walkways and driveways and fix any cracks. Remove anything that might make you trip as you walk through a door, such as a raised step or threshold. Trim any bushes or trees on the path to your home. Use bright outdoor lighting. Clear any walking paths of anything that might make someone trip, such as rocks or tools. Regularly check to see if handrails are loose or broken. Make sure that both sides of any steps have handrails. Any raised decks and porches should have guardrails on the edges. Have any leaves, snow, or ice cleared regularly. Use sand or salt on walking paths during winter. Clean up any spills in your garage right away. This includes oil or grease spills. What can I do in the bathroom? Use night lights. Install grab bars by the toilet and in the tub and shower. Do not use towel bars as grab bars. Use non-skid mats or decals in the tub or shower. If you need to sit down in the shower, use a plastic, non-slip stool. Keep the floor dry. Clean up any water that spills on the floor as soon as it happens. Remove soap buildup in the tub or shower regularly. Attach bath mats securely with double-sided non-slip rug tape. Do not have throw rugs and other things on the floor that can make you trip. What can I do in the  bedroom? Use night lights. Make sure that you have a light by your bed that is easy to reach. Do not use any sheets or blankets that are too big for your bed. They should not hang down onto the floor. Have a firm chair that has side arms. You can use this for support while you get dressed. Do not have throw rugs and other things on the floor that can make you trip. What can I do in the kitchen? Clean up any spills right away. Avoid walking on wet floors. Keep items that you use a lot in easy-to-reach places. If you need to reach something above you, use a strong step stool that has a grab bar. Keep electrical cords out of the way. Do not use floor polish or wax that makes floors slippery. If you must use wax, use non-skid floor wax. Do not have throw rugs and other things on the floor that can make you trip. What can I do with my stairs? Do not leave any items on the stairs. Make sure that there are handrails on both sides of the stairs and use them. Fix handrails that are broken or loose. Make sure that handrails are as long as the stairways. Check any carpeting to make sure that it is firmly attached to the stairs. Fix any carpet that is loose or worn. Avoid having throw rugs at the top or bottom of the stairs.  If you do have throw rugs, attach them to the floor with carpet tape. Make sure that you have a light switch at the top of the stairs and the bottom of the stairs. If you do not have them, ask someone to add them for you. What else can I do to help prevent falls? Wear shoes that: Do not have high heels. Have rubber bottoms. Are comfortable and fit you well. Are closed at the toe. Do not wear sandals. If you use a stepladder: Make sure that it is fully opened. Do not climb a closed stepladder. Make sure that both sides of the stepladder are locked into place. Ask someone to hold it for you, if possible. Clearly mark and make sure that you can see: Any grab bars or  handrails. First and last steps. Where the edge of each step is. Use tools that help you move around (mobility aids) if they are needed. These include: Canes. Walkers. Scooters. Crutches. Turn on the lights when you go into a dark area. Replace any light bulbs as soon as they burn out. Set up your furniture so you have a clear path. Avoid moving your furniture around. If any of your floors are uneven, fix them. If there are any pets around you, be aware of where they are. Review your medicines with your doctor. Some medicines can make you feel dizzy. This can increase your chance of falling. Ask your doctor what other things that you can do to help prevent falls. This information is not intended to replace advice given to you by your health care provider. Make sure you discuss any questions you have with your health care provider. Document Released: 10/01/2009 Document Revised: 05/12/2016 Document Reviewed: 01/09/2015 Elsevier Interactive Patient Education  2017 [employer].

## 2023-01-05 DIAGNOSIS — E113393 Type 2 diabetes mellitus with moderate nonproliferative diabetic retinopathy without macular edema, bilateral: Secondary | ICD-10-CM | POA: Diagnosis not present

## 2023-01-06 ENCOUNTER — Ambulatory Visit
Admission: RE | Admit: 2023-01-06 | Discharge: 2023-01-06 | Disposition: A | Discharge status: Home / Self Care | Payer: PPO | Source: Ambulatory Visit | Attending: Family Medicine | Admitting: Family Medicine

## 2023-01-06 DIAGNOSIS — E1159 Type 2 diabetes mellitus with other circulatory complications: Secondary | ICD-10-CM | POA: Diagnosis not present

## 2023-01-06 DIAGNOSIS — I152 Hypertension secondary to endocrine disorders: Secondary | ICD-10-CM | POA: Diagnosis not present

## 2023-01-06 DIAGNOSIS — R921 Mammographic calcification found on diagnostic imaging of breast: Secondary | ICD-10-CM | POA: Diagnosis not present

## 2023-01-06 NOTE — Progress Notes (Signed)
6 month breast follow up recommended to evaluate possible calcifications.

## 2023-01-16 NOTE — Progress Notes (Unsigned)
I,Meka Lewan R Wafaa Deemer,acting as a Education administrator for Gwyneth Sprout, FNP.,have documented all relevant documentation on the behalf of Gwyneth Sprout, FNP,as directed by  Gwyneth Sprout, FNP while in the presence of Gwyneth Sprout, FNP.  Established patient visit  Patient: Heather Burke   DOB: 1951/07/18   72 y.o. Female  MRN: [id number] Visit Date: 01/17/2023  Today's healthcare provider: Gwyneth Sprout, FNP  Introduced to nurse practitioner role and practice setting.  All questions answered.  Discussed provider/patient relationship and expectations.  Subjective    HPI  Diabetes Mellitus Type II, follow-up  Lab Results  Component Value Date   HGBA1C 6.2 (H) 07/14/2022   HGBA1C 6.1 (H) 01/14/2022   HGBA1C 6.1 (A) 06/15/2021   Last seen for diabetes 6 months ago.  Management since then includes: Continue to recommend balanced, lower carb meals. Smaller meal size, adding snacks. Choosing water as drink of choice and increasing purposeful exercise.  She reports excellent compliance with treatment. She is not having side effects.   Home blood sugar records:  not being taken  Episodes of hypoglycemia? No    --------------------------------------------------------------------------------------------------- Hypertension, follow-up  BP Readings from Last 3 Encounters:  01/17/23 (!) 160/84  07/14/22 127/62  04/27/22 136/70   Wt Readings from Last 3 Encounters:  01/17/23 172 lb 3.2 oz (78.1 kg)  12/22/22 164 lb (74.4 kg)  07/14/22 164 lb 1.6 oz (74.4 kg)     She was last seen for hypertension 6 months ago.  BP at that visit was 127/64. Management since that visit includes continuing treatment. She reports excellent compliance with treatment. She is not having side effects.  She is not exercising. She is not adherent to low salt diet.   Outside blood pressures are ranging from 161/82- 127/62  She does smoke - 2 packs per day   Use of agents associated with hypertension: none.    --------------------------------------------------------------------------------------------------- Lipid/Cholesterol, follow-up  Last Lipid Panel: Lab Results  Component Value Date   CHOL 179 07/14/2022   LDLCALC 75 07/14/2022   HDL 35 (L) 07/14/2022   TRIG 432 (H) 07/14/2022    She was last seen for this 6 months ago.  Management since that visit includes On mevacor- 20 mg .  She reports excellent compliance with treatment. She is not having side effects.   Last metabolic panel Lab Results  Component Value Date   GLUCOSE 96 07/14/2022   NA 137 07/14/2022   K 4.1 07/14/2022   BUN 14 07/14/2022   CREATININE 0.63 07/14/2022   EGFR 95 07/14/2022   GFRNONAA 60 12/04/2019   CALCIUM 10.0 07/14/2022   AST 27 07/14/2022   ALT 29 07/14/2022   The 10-year ASCVD risk score (Arnett DK, et al., 2019) is: 51%  ---------------------------------------------------------------------------------------------------  Medications: Outpatient Medications Prior to Visit  Medication Sig   aspirin 81 MG tablet Take 81 mg by mouth daily.   CANNABIDIOL PO Take 500 mg by mouth daily.   Dulaglutide (TRULICITY) 1.5 QZ/0.0PQ SOPN Inject 1.5 mg into the skin once a week. [place] patient assistance through Dec 2023   fluticasone (FLONASE) 50 MCG/ACT nasal spray SPRAY 2 SPRAYS INTO EACH NOSTRIL EVERY DAY   KLOR-CON M10 10 MEQ tablet TAKE 1 TABLET BY MOUTH EVERY DAY   latanoprost (XALATAN) 0.005 % ophthalmic solution 1 drop at bedtime.   levothyroxine (SYNTHROID) 88 MCG tablet TAKE 1 TABLET BY MOUTH EVERY DAY   losartan-hydrochlorothiazide (HYZAAR) 100-25 MG tablet TAKE 1 TABLET BY  MOUTH EVERY DAY   lovastatin (MEVACOR) 20 MG tablet TAKE 1 TABLET BY MOUTH EVERYDAY AT BEDTIME   metoprolol (TOPROL-XL) 200 MG 24 hr tablet TAKE 1 TABLET BY MOUTH EVERY DAY   Misc Natural Products (T-RELIEF CBD+13 SL) Place 500 mg under the tongue daily.   Nutritional Supplements (QUINOA/KALE/HEMP PO) Place 750  mg under the tongue.   Omega 3-5-6-7-9 Fatty Acids (COMPLETE OMEGA) CAPS Take by mouth. Vitalite capsules   [DISCONTINUED] diltiazem (DILT-XR) 240 MG 24 hr capsule TAKE 1 CAPSULE BY MOUTH EVERY DAY   levocetirizine (XYZAL) 5 MG tablet Take 5 mg by mouth as needed. (Patient not taking: Reported on 01/17/2023)   No facility-administered medications prior to visit.   Review of Systems    Objective    BP (!) 160/84   Pulse 69   Temp 98 F (36.7 C) (Oral)   Wt 172 lb 3.2 oz (78.1 kg)   SpO2 99%   BMI 30.50 kg/m   Physical Exam Vitals and nursing note reviewed.  Constitutional:      General: She is not in acute distress.    Appearance: Normal appearance. She is obese. She is not ill-appearing, toxic-appearing or diaphoretic.  HENT:     Head: Normocephalic and atraumatic.  Cardiovascular:     Rate and Rhythm: Normal rate and regular rhythm.     Pulses: Normal pulses.     Heart sounds: Normal heart sounds. No murmur heard.    No friction rub. No gallop.  Pulmonary:     Effort: Pulmonary effort is normal. No respiratory distress.     Breath sounds: Normal breath sounds. No stridor. No wheezing, rhonchi or rales.  Chest:     Chest wall: No tenderness.  Musculoskeletal:        General: No swelling, tenderness, deformity or signs of injury. Normal range of motion.     Right lower leg: No edema.     Left lower leg: No edema.  Skin:    General: Skin is warm and dry.     Capillary Refill: Capillary refill takes less than 2 seconds.     Coloration: Skin is not jaundiced or pale.     Findings: No bruising, erythema, lesion or rash.  Neurological:     General: No focal deficit present.     Mental Status: She is alert and oriented to person, place, and time. Mental status is at baseline.     Cranial Nerves: No cranial nerve deficit.     Sensory: No sensory deficit.     Motor: No weakness.     Coordination: Coordination normal.  Psychiatric:        Mood and Affect: Mood normal.         Behavior: Behavior normal.        Thought Content: Thought content normal.        Judgment: Judgment normal.     No results found for any visits on 01/17/23.  Assessment & Plan     Problem List Items Addressed This Visit       Cardiovascular and Mediastinum   Hypertension associated with type 2 diabetes mellitus ([place])    Chronic, elevated in setting of weight gain and lack of physical exercise. Continue to recommend balanced, lower carb meals. Smaller meal size, adding snacks. Choosing water as drink of choice and increasing purposeful exercise. Continue all medications RTC if remains >130/>80 once resuming lifestyle modifications Dilt 240; Hyzaar 100-25; toprol 200      Relevant Medications  diltiazem (DILT-XR) 240 MG 24 hr capsule     Endocrine   Hyperlipidemia associated with type 2 diabetes mellitus ([place]) - Primary    Chronic, previously stable Repeat LP On lovastatin 20 mg The 10-year ASCVD risk score (Arnett DK, et al., 2019) is: 51%   Values used to calculate the score:     Age: 42 years     Sex: Female     Is Non-Hispanic African American: No     Diabetic: Yes     Tobacco smoker: Yes     Systolic Blood Pressure: 176 mmHg     Is BP treated: Yes     HDL Cholesterol: 35 mg/dL     Total Cholesterol: 179 mg/dL       Relevant Medications   diltiazem (DILT-XR) 240 MG 24 hr capsule   Other Relevant Orders   Lipid panel   Type 2 diabetes mellitus with other diabetic kidney complication (HCC)    Chronic, previously well controlled Repeat A1c and urine micro On Trulicity 1.5 mg weekly       Relevant Orders   Hemoglobin A1c   Urine Microalbumin w/creat. ratio     Other   Compulsive tobacco user syndrome    Chronic, stable Due for annual low dose CT scan; does not want to participate in lung cancer "studies" Trial of wellbutrin to assist with nicotine cravings Previously used chantix with intolerance Knows "I need to quit" ASCVD risk 1/2 in 10 years with  comorbid conditions       Relevant Medications   buPROPion (WELLBUTRIN XL) 150 MG 24 hr tablet   Other Relevant Orders   Ambulatory Referral Lung Cancer Screening [facility]   Other Visit Diagnoses     Hypertension associated with diabetes ([place])       Relevant Medications   diltiazem (DILT-XR) 240 MG 24 hr capsule   Other Relevant Orders   Basic Metabolic Panel (BMET)   CBC      Return in about 6 months (around 07/18/2023) for annual examination.     Vonna Kotyk, FNP, have reviewed all documentation for this visit. The documentation on 01/17/23 for the exam, diagnosis, procedures, and orders are all accurate and complete.  Gwyneth Sprout, [place] [phone] (phone) [phone] (fax)  [place]

## 2023-01-17 ENCOUNTER — Encounter: Payer: Self-pay | Admitting: Family Medicine

## 2023-01-17 ENCOUNTER — Ambulatory Visit (INDEPENDENT_AMBULATORY_CARE_PROVIDER_SITE_OTHER): Payer: PPO | Admitting: Family Medicine

## 2023-01-17 VITALS — BP 160/84 | HR 69 | Temp 98.0°F | Wt 172.2 lb

## 2023-01-17 DIAGNOSIS — E785 Hyperlipidemia, unspecified: Secondary | ICD-10-CM

## 2023-01-17 DIAGNOSIS — E1129 Type 2 diabetes mellitus with other diabetic kidney complication: Secondary | ICD-10-CM

## 2023-01-17 DIAGNOSIS — I152 Hypertension secondary to endocrine disorders: Secondary | ICD-10-CM | POA: Diagnosis not present

## 2023-01-17 DIAGNOSIS — E1159 Type 2 diabetes mellitus with other circulatory complications: Secondary | ICD-10-CM

## 2023-01-17 DIAGNOSIS — E1169 Type 2 diabetes mellitus with other specified complication: Secondary | ICD-10-CM | POA: Diagnosis not present

## 2023-01-17 DIAGNOSIS — F17200 Nicotine dependence, unspecified, uncomplicated: Secondary | ICD-10-CM

## 2023-01-17 MED ORDER — BUPROPION HCL ER (XL) 150 MG PO TB24: 150.0000 mg | ORAL_TABLET | Freq: Every day | ORAL | 1 refills | Status: DC

## 2023-01-17 MED ORDER — DILTIAZEM HCL ER 240 MG PO CP24: 240.0000 mg | ORAL_CAPSULE | Freq: Every day | ORAL | 2 refills | Status: DC

## 2023-01-17 NOTE — Assessment & Plan Note (Signed)
Chronic, previously well controlled Repeat A1c and urine micro On Trulicity 1.5 mg weekly

## 2023-01-17 NOTE — Assessment & Plan Note (Signed)
Chronic, elevated in setting of weight gain and lack of physical exercise. Continue to recommend balanced, lower carb meals. Smaller meal size, adding snacks. Choosing water as drink of choice and increasing purposeful exercise. Continue all medications RTC if remains >130/>80 once resuming lifestyle modifications Dilt 240; Hyzaar 100-25; toprol 200

## 2023-01-17 NOTE — Assessment & Plan Note (Signed)
Chronic, stable Due for annual low dose CT scan; does not want to participate in lung cancer "studies" Trial of wellbutrin to assist with nicotine cravings Previously used chantix with intolerance Knows "I need to quit" ASCVD risk 1/2 in 10 years with comorbid conditions

## 2023-01-17 NOTE — Assessment & Plan Note (Signed)
Chronic, previously stable Repeat LP On lovastatin 20 mg The 10-year ASCVD risk score (Arnett DK, et al., 2019) is: 51%   Values used to calculate the score:     Age: 72 years     Sex: Female     Is Non-Hispanic African American: No     Diabetic: Yes     Tobacco smoker: Yes     Systolic Blood Pressure: 035 mmHg     Is BP treated: Yes     HDL Cholesterol: 35 mg/dL     Total Cholesterol: 179 mg/dL

## 2023-01-18 NOTE — Progress Notes (Signed)
Fats remain uncontrolled in lipid panel; continue to recommend additional medication to assist with fat reduction as we work on tobacco reduction with goal of cessation. ASCVD risk is very high 50% in 10 years for stroke and/or heart attack. A1c has increased; however, still stable. Hopefully we can get back to walking soon! Urine pending.

## 2023-01-19 ENCOUNTER — Other Ambulatory Visit: Payer: Self-pay | Admitting: Family Medicine

## 2023-01-19 LAB — LIPID PANEL
Chol/HDL Ratio: 5.2 ratio — ABNORMAL HIGH (ref 0.0–4.4)
Cholesterol, Total: 182 mg/dL (ref 100–199)
HDL: 35 mg/dL — ABNORMAL LOW (ref >39)
LDL Chol Calc (NIH): 77 mg/dL (ref 0–99)
Triglycerides: 441 mg/dL — ABNORMAL HIGH (ref 0–149)
VLDL Cholesterol Cal: 70 mg/dL — ABNORMAL HIGH (ref 5–40)

## 2023-01-19 LAB — CBC
Hematocrit: 44.1 % (ref 34.0–46.6)
Hemoglobin: 15.4 g/dL (ref 11.1–15.9)
MCH: 31.8 pg (ref 26.6–33.0)
MCHC: 34.9 g/dL (ref 31.5–35.7)
MCV: 91 fL (ref 79–97)
Platelets: 232 10*3/uL (ref 150–450)
RBC: 4.84 x10E6/uL (ref 3.77–5.28)
RDW: 12.7 % (ref 11.7–15.4)
WBC: 7.1 10*3/uL (ref 3.4–10.8)

## 2023-01-19 LAB — BASIC METABOLIC PANEL
BUN/Creatinine Ratio: 21 (ref 12–28)
BUN: 15 mg/dL (ref 8–27)
CO2: 21 mmol/L (ref 20–29)
Calcium: 10.3 mg/dL (ref 8.7–10.3)
Chloride: 97 mmol/L (ref 96–106)
Creatinine, Ser: 0.7 mg/dL (ref 0.57–1.00)
Glucose: 152 mg/dL — ABNORMAL HIGH (ref 70–99)
Potassium: 3.9 mmol/L (ref 3.5–5.2)
Sodium: 139 mmol/L (ref 134–144)
eGFR: 93 mL/min/{1.73_m2} (ref >59)

## 2023-01-19 LAB — HEMOGLOBIN A1C
Est. average glucose Bld gHb Est-mCnc: 151 mg/dL
Hgb A1c MFr Bld: 6.9 % — ABNORMAL HIGH (ref 4.8–5.6)

## 2023-01-19 LAB — MICROALBUMIN / CREATININE URINE RATIO
Creatinine, Urine: 9.2 mg/dL
Microalbumin, Urine: 4.5 ug/mL

## 2023-01-19 MED ORDER — ICOSAPENT ETHYL 1 G PO CAPS: 2.0000 g | ORAL_CAPSULE | Freq: Two times a day (BID) | ORAL | 11 refills | Status: DC

## 2023-01-19 NOTE — Progress Notes (Signed)
Normal urine micro; however, dilute sampling.

## 2023-01-20 ENCOUNTER — Telehealth: Payer: Self-pay

## 2023-01-20 NOTE — Progress Notes (Signed)
Care Coordination Pharmacy Assistant   Name: [name]  MRN: [id number] DOB: May 15, 1951  Reason for Encounter: Application for [employer] Rolm [place]ala, CPP sent me a task requesting that I contact the patient to see if she would like assistance with a new medication that was prescribed for her (Vascepa 1 g capsule) as her copayment for this medication could be expensive.  I contact the patient to speak with her regarding the [place] for her Vascepa and see if she would like me to start the application process. The patient is agreeable as she stated the copayment for this medication is 100.00. Patient advised that I would start the process and contact her with an update.  Application for Vascepa started via the [employer].  Patient was approved and information is as follows:  HE[employer] ID: [id number]  REFERENCE NUMBER: [id number] Mort Sawyers  FUND: Hypercholesterolemia - Medicare Access  ASSISTANCE TYPE: Co-pay  START DATE: 2022-12-21  END DATE:2023-12-21  GRANT AMOUNT $ 2500  I contacted patients pharmacy. I spoke with the representative at [facility] provided her with the information for processing prescription, and patient now has a zero copayment. Patient was contacted and advised they were getting the medication ready for her pickup.  CPP notified   MEMBER ID [id number]  ACTIVATION DATE 01/20/2023  EXPIRATION DATE 12/21/2023  PC GROUP [id number]  PC PCN [id number]  PC BIN [id number]  PC PROCESSOR PDMI   Medications: Outpatient Encounter Medications as of 01/20/2023  Medication Sig   aspirin 81 MG tablet Take 81 mg by mouth daily.   buPROPion (WELLBUTRIN XL) 150 MG 24 hr tablet Take 1 tablet (150 mg total) by mouth daily.   CANNABIDIOL PO Take 500 mg by mouth daily.   diltiazem (DILT-XR) 240 MG 24 hr capsule Take 1 capsule (240 mg total) by mouth daily.   Dulaglutide (TRULICITY) 1.5 OM/7.6HM SOPN Inject 1.5 mg into the skin  once a week. [place] patient assistance through Dec 2023   fluticasone (FLONASE) 50 MCG/ACT nasal spray SPRAY 2 SPRAYS INTO EACH NOSTRIL EVERY DAY   icosapent Ethyl (VASCEPA) 1 g capsule Take 2 capsules (2 g total) by mouth 2 (two) times daily.   KLOR-CON M10 10 MEQ tablet TAKE 1 TABLET BY MOUTH EVERY DAY   latanoprost (XALATAN) 0.005 % ophthalmic solution 1 drop at bedtime.   levocetirizine (XYZAL) 5 MG tablet Take 5 mg by mouth as needed. (Patient not taking: Reported on 01/17/2023)   levothyroxine (SYNTHROID) 88 MCG tablet TAKE 1 TABLET BY MOUTH EVERY DAY   losartan-hydrochlorothiazide (HYZAAR) 100-25 MG tablet TAKE 1 TABLET BY MOUTH EVERY DAY   lovastatin (MEVACOR) 20 MG tablet TAKE 1 TABLET BY MOUTH EVERYDAY AT BEDTIME   metoprolol (TOPROL-XL) 200 MG 24 hr tablet TAKE 1 TABLET BY MOUTH EVERY DAY   Misc Natural Products (T-RELIEF CBD+13 SL) Place 500 mg under the tongue daily.   Nutritional Supplements (QUINOA/KALE/HEMP PO) Place 750 mg under the tongue.   No facility-administered encounter medications on file as of 01/20/2023.   Lynann Bologna, CPA/CMA Clinical Pharmacist Assistant Phone: [phone]

## 2023-02-17 ENCOUNTER — Telehealth: Payer: Self-pay

## 2023-02-17 DIAGNOSIS — E1169 Type 2 diabetes mellitus with other specified complication: Secondary | ICD-10-CM

## 2023-02-17 MED ORDER — ROSUVASTATIN CALCIUM 20 MG PO TABS: 20.0000 mg | ORAL_TABLET | Freq: Every day | ORAL | 1 refills | Status: DC

## 2023-02-17 NOTE — Progress Notes (Addendum)
Care Coordination Pharmacy Assistant   Name: Heather Burke  MRN: [id number] DOB: 23-Jul-1951   Recent office visits:  01/30/202 [name], FNP (PCP Office Visit) for Follow-up- Started: Bupropion HCl 150 mg daily, Diltiazem HCl 240 mg daily, Lab orders placed, Referral Lung Cancer Screening placed, BP: 160/84,   12/22/2022 Kirke Shaggy, LPN (PCP Clinical Support Visit) for Medicare Wellness Exam- No medication changes noted, No orders placed,  Recent consult visits:  None ID  Hospital visits:  None in previous 6 months  Medications: Outpatient Encounter Medications as of 02/17/2023  Medication Sig   aspirin 81 MG tablet Take 81 mg by mouth daily.   buPROPion (WELLBUTRIN XL) 150 MG 24 hr tablet Take 1 tablet (150 mg total) by mouth daily.   CANNABIDIOL PO Take 500 mg by mouth daily.   diltiazem (DILT-XR) 240 MG 24 hr capsule Take 1 capsule (240 mg total) by mouth daily.   Dulaglutide (TRULICITY) 1.5 0000000 SOPN Inject 1.5 mg into the skin once a week. [place] patient assistance through Dec 2023   fluticasone (FLONASE) 50 MCG/ACT nasal spray SPRAY 2 SPRAYS INTO EACH NOSTRIL EVERY DAY   icosapent Ethyl (VASCEPA) 1 g capsule Take 2 capsules (2 g total) by mouth 2 (two) times daily.   KLOR-CON M10 10 MEQ tablet TAKE 1 TABLET BY MOUTH EVERY DAY   latanoprost (XALATAN) 0.005 % ophthalmic solution 1 drop at bedtime.   levocetirizine (XYZAL) 5 MG tablet Take 5 mg by mouth as needed. (Patient not taking: Reported on 01/17/2023)   levothyroxine (SYNTHROID) 88 MCG tablet TAKE 1 TABLET BY MOUTH EVERY DAY   losartan-hydrochlorothiazide (HYZAAR) 100-25 MG tablet TAKE 1 TABLET BY MOUTH EVERY DAY   lovastatin (MEVACOR) 20 MG tablet TAKE 1 TABLET BY MOUTH EVERYDAY AT BEDTIME   metoprolol (TOPROL-XL) 200 MG 24 hr tablet TAKE 1 TABLET BY MOUTH EVERY DAY   Misc Natural Products (T-RELIEF CBD+13 SL) Place 500 mg under the tongue daily.   Nutritional Supplements (QUINOA/KALE/HEMP PO) Place 750  mg under the tongue.   No facility-administered encounter medications on file as of 02/17/2023.    Reviewed chart prior to disease state call. Spoke with patient regarding BP  Recent Office Vitals: BP Readings from Last 3 Encounters:  01/17/23 (!) 160/84  07/14/22 127/62  04/27/22 136/70   Pulse Readings from Last 3 Encounters:  01/17/23 69  07/14/22 71  04/27/22 80    Wt Readings from Last 3 Encounters:  01/17/23 172 lb 3.2 oz (78.1 kg)  12/22/22 164 lb (74.4 kg)  07/14/22 164 lb 1.6 oz (74.4 kg)     Kidney Function Lab Results  Component Value Date/Time   CREATININE 0.70 01/17/2023 09:36 AM   CREATININE 0.63 07/14/2022 10:30 AM   GFRNONAA 60 12/04/2019 10:11 AM   GFRAA 69 12/04/2019 10:11 AM       Latest Ref Rng & Units 01/17/2023    9:36 AM 07/14/2022   10:30 AM 01/14/2022   10:32 AM  BMP  Glucose 70 - 99 mg/dL 152  96  91   BUN 8 - 27 mg/dL '15  14  19   '$ Creatinine 0.57 - 1.00 mg/dL 0.70  0.63  0.73   BUN/Creat Ratio 12 - '28 21  22  26   '$ Sodium 134 - 144 mmol/L 139  137  141   Potassium 3.5 - 5.2 mmol/L 3.9  4.1  4.8   Chloride 96 - 106 mmol/L 97  96  99   CO2  20 - 29 mmol/L '21  25  27   '$ Calcium 8.7 - 10.3 mg/dL 10.3  10.0  10.1     Current antihypertensive regimen:  Diltiazem XR 240 mg daily Losartan-HCTZ 100-25 mg daily Metoprolol XL 200 mg daily  How often are you checking your Blood Pressure?  Patient stated she has not taken her BP in the last few weeks but denies any symptoms of Hypertension  What recent interventions/DTPs have been made by any provider to improve Blood Pressure control since last CPP Visit: None ID  Any recent hospitalizations or ED visits since last visit with CPP? No  What diet changes have been made to improve Blood Pressure Control?  None ID  Adherence Review: Is the patient currently on ACE/ARB medication? Yes Does the patient have >5 day gap between last estimated fill dates? No  I spoke to the patient and she reports she  is doing well she just has a little head cold. Patient stated that she has discontinued the Vascepa 1g capsule as the 1st day she took it it made her feel like her heart was beating out of her chest and she just felt terrible. Patient stated she can not take this medication. Per patient she is taking 2 fiber pills daily to see how that helps. Patient stated she is also receiving her Trulicity from [facility] with no issues. Patient has no additional issues or concerns today.  Patient has a follow-up telephone appointment with [name], CPP on 04/10/2023 @ 1100.    Addendum:  I received a response from CPP requesting I contact patient to inform her of the following:  Per CPP: "I am in agreement with stopping Vascepa. it is still important we try and decrease her triglycerides to reduce risk of cardiovascular disease and pancreatitis. I would recommend we switch her current cholesterol medication lovastatin to rosuvastatin. It looks like she may have tried it before in the past but I'd like her to consider it as it will be a lot more effective at reducing her triglycerides without the same risks of heart palpitations.    Additionally alcohol use can play a huge role in elevating triglycerides. If she drinks alcohol then I would recommend cutting down her alcohol use.    Please let me know if she is ok with the medication switch. "  I spoke with the patient and she is okay with trying the Rosuvastatin. I informed her that the Rosuvastatin would replace her Lovastatin and she is agreeable. Patient stated she does have a lot of Lovastatin on hand. I advised her to just hold on to it just in case there is an issue with Rosuvastatin and encouraged her to give me a call if she starts experiencing any side effects once starting the Rosuvastatin. Patient also informed about the use of alcohol and if she does drink CPP recommends cutting down her alcohol use as it does play an role in elevating  triglycerides. Patient verbalized understanding to all.  CPP informed [facility].  Lynann Bologna, [place] Pharmacist Assistant Phone: [phone]

## 2023-02-17 NOTE — Addendum Note (Signed)
Addended by: Daron Offer A on: 02/17/2023 12:19 PM   Modules accepted: Orders

## 2023-02-17 NOTE — Addendum Note (Signed)
Addended by: Daron Offer A on: 02/17/2023 10:47 AM   Modules accepted: Orders

## 2023-03-07 ENCOUNTER — Other Ambulatory Visit: Payer: Self-pay | Admitting: Family Medicine

## 2023-03-07 DIAGNOSIS — J302 Other seasonal allergic rhinitis: Secondary | ICD-10-CM

## 2023-04-10 ENCOUNTER — Ambulatory Visit: Payer: PPO

## 2023-04-10 ENCOUNTER — Other Ambulatory Visit: Payer: Self-pay | Admitting: Family Medicine

## 2023-04-10 DIAGNOSIS — E782 Mixed hyperlipidemia: Secondary | ICD-10-CM

## 2023-04-10 DIAGNOSIS — E039 Hypothyroidism, unspecified: Secondary | ICD-10-CM

## 2023-04-10 DIAGNOSIS — F17200 Nicotine dependence, unspecified, uncomplicated: Secondary | ICD-10-CM

## 2023-04-10 NOTE — Progress Notes (Signed)
Care Management & Coordination Services Pharmacy Note  04/10/2023 Name:  Heather Burke MRN:  [id number] DOB:  [birth year]  Summary: Patient presents for follow-up consult.   -Patient misunderstood previously discussed instructions and has been taking rosuvastatin and Vascepa. She has not noted any additional instances of palpitations, but has noted significant weakness, bloating, and fatigue.   -Patient is maintaining her activity level, walking 4x daily but now is only able to walk one lap around the track before she is tired. She denies dyspnea and reports when she has checked her O2 levels during exercise they have always remained > 100%.   -Patient never started Bupropion. She states she is not ready to try and cut down on her cigarette use.   Recommendations/Changes made from today's visit: -STOP rosuvastatin. Plan for 2-week washout period.   Follow up plan: [facility] check-in in 2 weeks to assess symptoms.  CPP follow-up 1 month    Subjective: Heather Burke is an 72 y.o. year old female who is a primary patient of Heather Kindle, FNP.  The care coordination team was consulted for assistance with disease management and care coordination needs.    Engaged with patient by telephone for follow up visit.  Recent office visits: 01/30/202 Heather Norton, FNP (PCP Office Visit) for Follow-up- Started: Bupropion HCl 150 mg daily, Diltiazem HCl 240 mg daily, Lab orders placed, Referral Lung Cancer Screening placed, BP: 160/84,    12/22/2022 AWV  Recent consult visits: None ID  Hospital visits: None in previous 6 months   Objective:  Lab Results  Component Value Date   CREATININE 0.70 01/17/2023   BUN 15 01/17/2023   EGFR 93 01/17/2023   GFRNONAA 60 12/04/2019   GFRAA 69 12/04/2019   NA 139 01/17/2023   K 3.9 01/17/2023   CALCIUM 10.3 01/17/2023   CO2 21 01/17/2023   GLUCOSE 152 (H) 01/17/2023    Lab Results  Component Value Date/Time   HGBA1C 6.9 (H) 01/17/2023 09:36  AM   HGBA1C 6.2 (H) 07/14/2022 10:30 AM   MICROALBUR Negative 09/29/2015 11:26 AM    Last diabetic Eye exam:  Lab Results  Component Value Date/Time   HMDIABEYEEXA No Retinopathy 06/28/2021 12:00 AM    Last diabetic Foot exam: No results found for: "HMDIABFOOTEX"   Lab Results  Component Value Date   CHOL 182 01/17/2023   HDL 35 (L) 01/17/2023   LDLCALC 77 01/17/2023   TRIG 441 (H) 01/17/2023   CHOLHDL 5.2 (H) 01/17/2023       Latest Ref Rng & Units 07/14/2022   10:30 AM 01/14/2022   10:32 AM 06/17/2021    1:38 PM  Hepatic Function  Total Protein 6.0 - 8.5 g/dL 7.8  7.8  7.5   Albumin 3.9 - 4.9 g/dL 4.4  4.5  4.1   AST 0 - 40 IU/L ALT 0 - 32 IU/L 29  31  34   Alk Phosphatase 44 - 121 IU/L 81  74  76   Total Bilirubin 0.0 - 1.2 mg/dL 0.4  0.3  0.4     Lab Results  Component Value Date/Time   TSH 1.560 07/14/2022 10:30 AM   TSH 1.440 01/14/2022 10:32 AM   FREET4 1.49 07/14/2022 10:30 AM   FREET4 1.45 01/14/2022 10:32 AM       Latest Ref Rng & Units 01/17/2023    9:36 AM 12/04/2019   10:11 AM 09/18/2018    9:12 AM  CBC  WBC 3.4 - 10.8 x10E3/uL 7.1  7.0  6.6   Hemoglobin 11.1 - 15.9 g/dL 16.1  09.6  04.5   Hematocrit 34.0 - 46.6 % 44.1  43.8  43.5   Platelets 150 - 450 x10E3/uL 232  237  241     Lab Results  Component Value Date/Time   VD25OH 48.2 09/18/2018 09:12 AM   VD25OH 52.7 04/20/2016 08:04 AM    Clinical ASCVD: No  The 10-year ASCVD risk score (Arnett DK, et al., 2019) is: 51.2%   Values used to calculate the score:     Age: 25 years     Sex: Female     Is Non-Hispanic African American: No     Diabetic: Yes     Tobacco smoker: Yes     Systolic Blood Pressure: 160 mmHg     Is BP treated: Yes     HDL Cholesterol: 35 mg/dL     Total Cholesterol: 182 mg/dL       4/0/9811    9:14 PM 07/14/2022    9:50 AM 12/16/2021    9:04 AM  Depression screen PHQ 2/9  Decreased Interest 0 0 0  Down, Depressed, Hopeless 0 0 0  PHQ - 2 Score 0 0 0   Altered sleeping 0    Tired, decreased energy 0    Change in appetite 0    Feeling bad or failure about yourself  0    Trouble concentrating 0    Moving slowly or fidgety/restless 0    Suicidal thoughts 0    PHQ-9 Score 0    Difficult doing work/chores Not difficult at all       Social History   Tobacco Use  Smoking Status Every Day   Packs/day: 2.00   Years: 49.00   Additional pack years: 0.00   Total pack years: 98.00   Types: Cigarettes  Smokeless Tobacco Never   BP Readings from Last 3 Encounters:  01/17/23 (!) 160/84  07/14/22 127/62  04/27/22 136/70   Pulse Readings from Last 3 Encounters:  01/17/23 69  07/14/22 71  04/27/22 80   Wt Readings from Last 3 Encounters:  01/17/23 172 lb 3.2 oz (78.1 kg)  12/22/22 164 lb (74.4 kg)  07/14/22 164 lb 1.6 oz (74.4 kg)   BMI Readings from Last 3 Encounters:  01/17/23 30.50 kg/m  12/22/22 29.05 kg/m  07/14/22 29.07 kg/m    Allergies  Allergen Reactions   Amlodipine Nausea Only and Other (See Comments)    Stomach cramping   Atorvastatin Nausea And Vomiting   Lisinopril Cough   Pravastatin Nausea And Vomiting   Icosapent Ethyl Palpitations   Nifedipine Heather Other (See Comments)    headache    Medications Reviewed Today     Reviewed by Heather Kindle, FNP (Family Nurse Practitioner) on 01/17/23 at [phone]  Med List Status: <None>   Medication Order Taking? Sig Documenting Provider Last Dose Status Informant  aspirin 81 MG tablet [id number] Yes Take 81 mg by mouth daily. [provider] Taking Active   buPROPion (WELLBUTRIN XL) 150 MG 24 hr tablet [id number] Yes Take 1 tablet (150 mg total) by mouth daily. Heather Burke T, FNP  Active   CANNABIDIOL PO [id number] Yes Take 500 mg by mouth daily. [provider] Taking Active   diltiazem (DILT-XR) 240 MG 24 hr capsule [id number]  Take 1 capsule (240 mg total) by mouth daily. Heather Kindle, FNP  Active   Dulaglutide (TRULICITY) 1.5 MG/0.5ML [facility]  [id number] Yes Inject 1.5 mg into the skin once a week. Via [employer] patient assistance through Dec 2023 Erasmo Downer, MD Taking Active   fluticasone [facility]) 50 MCG/ACT nasal spray [id number] Yes SPRAY 2 SPRAYS INTO EACH NOSTRIL EVERY DAY Heather Kindle, FNP Taking Active   KLOR-CON M10 10 MEQ tablet [id number] Yes TAKE 1 TABLET BY MOUTH EVERY DAY Heather Kindle, FNP Taking Active   latanoprost (XALATAN) 0.005 % ophthalmic solution [id number] Yes 1 drop at bedtime. [provider] Taking Active   levocetirizine (XYZAL) 5 MG tablet [id number] No Take 5 mg by mouth as needed.  Patient not taking: Reported on 01/17/2023   [provider] Not Taking Active   levothyroxine (SYNTHROID) 88 MCG tablet [id number] Yes TAKE 1 TABLET BY MOUTH EVERY DAY Heather Kindle, FNP Taking Active   losartan-hydrochlorothiazide [facility]) 100-25 MG tablet [id number] Yes TAKE 1 TABLET BY MOUTH EVERY DAY Heather Kindle, FNP Taking Active   lovastatin (MEVACOR) 20 MG tablet [id number] Yes TAKE 1 TABLET BY MOUTH EVERYDAY AT BEDTIME Heather Kindle, FNP Taking Active   metoprolol (TOPROL-XL) 200 MG 24 hr tablet [id number] Yes TAKE 1 TABLET BY MOUTH EVERY DAY Heather Kindle, FNP Taking Active   Misc Natural Products (T-RELIEF CBD+13 SL) [id number] Yes Place 500 mg under the tongue daily. [provider] Taking Active Self  Nutritional Supplements (QUINOA/KALE/HEMP PO) [id number] Yes Place 750 mg under the tongue. [provider] Taking Active   Omega 3-5-6-7-9 Fatty Acids (COMPLETE OMEGA) CAPS [id number] Yes Take by mouth. Vitalite capsules [provider] Taking Active             SDOH:  (Social Determinants of Health) assessments and interventions performed: Yes SDOH Interventions    Flowsheet Row Clinical Support from 12/22/2022 in [facility] Chronic Care Management from 02/21/2022 in [facility] Clinical Support from  12/16/2021 in [facility] Chronic Care Management from 11/23/2021 in [facility] Chronic Care Management from 09/14/2021 in [facility] Chronic Care Management from 08/10/2021 in [facility]  SDOH Interventions        Food Insecurity Interventions Intervention Not Indicated -- Intervention Not Indicated -- -- --  Housing Interventions Intervention Not Indicated -- Intervention Not Indicated -- -- --  Transportation Interventions Intervention Not Indicated -- Intervention Not Indicated -- -- --  Utilities Interventions Intervention Not Indicated -- -- -- -- --  Alcohol Usage Interventions Intervention Not Indicated (Score <7) -- -- -- -- --  Financial Strain Interventions Intervention Not Indicated Intervention Not Indicated Intervention Not Indicated Intervention Not Indicated Intervention Not Indicated Intervention Not Indicated  Physical Activity Interventions Intervention Not Indicated -- Intervention Not Indicated -- -- --  Stress Interventions Intervention Not Indicated -- Intervention Not Indicated -- -- --  Social Connections Interventions Intervention Not Indicated -- Intervention Not Indicated -- -- --       Medication Assistance: None required.  Patient affirms current coverage meets needs.  Medication Access: Within the past 30 days, how often has patient missed a dose of medication? None Is a pillbox or other method used to improve adherence? No  Factors that may affect medication adherence? no barriers identified Are meds synced by current pharmacy? No  Are meds delivered by current pharmacy? No  Does patient experience delays in picking up medications due to transportation concerns? No   [facility] Services Reviewed: Is patient disadvantaged to  use [facility]?: Yes  Current Rx insurance plan: HTA [facility] of [phone] [place], [place] [address] [place] [address] Phone: [phone] Fax: [phone]  [facility] services reviewed with patient today?: No  Patient requests to transfer care to [facility]?: No  Reason patient declined to change pharmacies: Disadvantaged due to insurance/mail order  Compliance/Adherence/Medication fill history: Care Gaps: Foot Exam  Ophthalmology Exam  Covid   Star-Rating Drugs: Losartan-HCTZ 100-25 mg: Last filled 02/26/23 for 90-DS    Assessment/Plan  Hypertension (BP goal <130/80) -Controlled -History of heart murmur -Current treatment: Diltiazem XR 240 mg daily: Appropriate, Effective, Safe, Accessible Losartan-HCTZ 100-25 mg daily: Appropriate, Effective, Safe, Accessible  Metoprolol XL 200 mg daily: Appropriate, Effective, Safe, Accessible  -Medications previously tried: Amlodipine, Olmesartan, clonidine, nifedipine, Lisinopril (cough)  -Current home readings:  -Denies hypotensive/hypertensive symptoms  -Recommended to continue current medication  Hyperlipidemia: (LDL goal < 70) -Uncontrolled -Current treatment: Rosuvastatin 20 mg daily  Vascepa 2 g twice daily  -Medications previously tried: Vascepa (racing heart), Lovastatin   -Patient misunderstood previously discussed instructions and has been taking rosuvastatin and Vascepa. She has not noted any additional instances of palpitations, but has noted significant weakness, bloating, and fatigue.  -Patient is maintaining her activity level, walking 4x daily but now is only able to walk one lap around the track before she is tired. She denies dyspnea and reports when she has checked her O2 levels during exercise they have always remained > 100%.  -STOP rosuvastatin. Plan for 2-week washout period.   Diabetes (A1c goal <7%) -Controlled -Current medications: Trulicity 1.5 mg weekly  -Medications previously tried: NA  -Current home glucose readings fasting  glucose: Not routinely monitoring  -Denies hypoglycemic/hyperglycemic symptoms -Working on weight loss via [employer]. Lost 36 pounds since starting.  -Recommended to continue current medication  Hypothyroidism (Goal: Maintain stable thyroid function) -Controlled -Current treatment  Levothyroxine 88 mcg daily  -Medications previously tried: NA  -Recommended to continue current medication  Allergic Rhinitis (Goal: Minimize symptoms) -Controlled -Current treatment  Flonase 50 mcg/act 2 sprays nightly Xyzal 5 mg nightly   -Medications previously tried: NA -Scratchy throat, otherwise states symptoms well controlled -Continue current medications  Tobacco use (Goal Quit smoking) -Uncontrolled -Currently smoking 2 ppd  -Previous quit attempts: Chantix (vivid dreams, poor sleep).  -Current treatment  None  -Patient smokes Within 30 minutes of waking -Patient triggers include: finishing a meal and drinking coffee and seeing someone else smoke -Patient never started Bupropion. She states she is not ready to try and cut down on her cigarette use.  -Patient in a pre-contemplative stage of action for tobacco cessation   Follow Up Plan: Telephone follow up appointment with care management team member scheduled for:  05/08/23 at 1:00 PM  Angelena Sole, PharmD, Patsy [place], CPP  Clinical Pharmacist Practitioner  [facility] [phone]

## 2023-04-11 NOTE — Telephone Encounter (Signed)
Requested Prescriptions  Refused Prescriptions Disp Refills   levothyroxine (SYNTHROID) 88 MCG tablet [Pharmacy Med Name: LEVOTHYROXINE 88 MCG TABLET] 90 tablet 3    Sig: TAKE 1 TABLET BY MOUTH EVERY DAY     Endocrinology:  Hypothyroid Agents Passed - 04/10/2023 12:35 PM      Passed - TSH in normal range and within 360 days    TSH  Date Value Ref Range Status  07/14/2022 1.560 0.450 - 4.500 uIU/mL Final         Passed - Valid encounter within last 12 months    Recent Outpatient Visits           2 months ago Hypertension associated with diabetes [facility])   [facility] [name] T, FNP   9 months ago Type 2 diabetes mellitus with other diabetic kidney complication [facility])   [facility] Jacky Kindle, FNP   10 months ago Chronic cough   [facility] [place], [facility]-C   11 months ago COPD exacerbation [facility])   [facility] [name] T, FNP   1 year ago Annual physical exam   [facility] Jacky Kindle, FNP       Future Appointments             In 3 months Jacky Kindle, FNP [facility]

## 2023-04-22 ENCOUNTER — Other Ambulatory Visit: Payer: Self-pay | Admitting: Family Medicine

## 2023-04-22 DIAGNOSIS — E876 Hypokalemia: Secondary | ICD-10-CM

## 2023-04-22 DIAGNOSIS — I152 Hypertension secondary to endocrine disorders: Secondary | ICD-10-CM

## 2023-04-24 NOTE — Telephone Encounter (Signed)
Requested Prescriptions  Pending Prescriptions Disp Refills   losartan-hydrochlorothiazide (HYZAAR) 100-25 MG tablet [Pharmacy Med Name: LOSARTAN-HCTZ 100-25 MG TAB] 90 tablet 0    Sig: TAKE 1 TABLET BY MOUTH EVERY DAY     Cardiovascular: ARB + Diuretic Combos Failed - 04/22/2023  7:52 AM      Failed - Last BP in normal range    BP Readings from Last 1 Encounters:  01/17/23 (!) 160/84         Passed - K in normal range and within 180 days    Potassium  Date Value Ref Range Status  01/17/2023 3.9 3.5 - 5.2 mmol/L Final         Passed - Na in normal range and within 180 days    Sodium  Date Value Ref Range Status  01/17/2023 139 134 - 144 mmol/L Final         Passed - Cr in normal range and within 180 days    Creatinine, Ser  Date Value Ref Range Status  01/17/2023 0.70 0.57 - 1.00 mg/dL Final         Passed - eGFR is 10 or above and within 180 days    GFR calc Af Amer  Date Value Ref Range Status  12/04/2019 69 >59 mL/min/1.73 Final   GFR calc non Af Amer  Date Value Ref Range Status  12/04/2019 60 >59 mL/min/1.73 Final   eGFR  Date Value Ref Range Status  01/17/2023 93 >59 mL/min/1.73 Final         Passed - Patient is not pregnant      Passed - Valid encounter within last 6 months    Recent Outpatient Visits           3 months ago Hypertension associated with diabetes [facility])   [place] [facility] [name] T, FNP   9 months ago Type 2 diabetes mellitus with other diabetic kidney complication [facility])   [place] [facility] Jacky Kindle, FNP   11 months ago Chronic cough   [place] [facility] [place], [place], PA-C   12 months ago COPD exacerbation [facility])   [place] [facility] [name] T, FNP   1 year ago Annual physical exam   [place] [facility] [name] T, FNP       Future Appointments             In 2 months Jacky Kindle, FNP [place]  [facility]             KLOR-CON M10 10 MEQ tablet [Pharmacy Med Name: KLOR-CON M10 TABLET] 90 tablet 0    Sig: TAKE 1 TABLET BY MOUTH EVERY DAY     Endocrinology:  Minerals - Potassium Supplementation Passed - 04/22/2023  7:52 AM      Passed - K in normal range and within 360 days    Potassium  Date Value Ref Range Status  01/17/2023 3.9 3.5 - 5.2 mmol/L Final         Passed - Cr in normal range and within 360 days    Creatinine, Ser  Date Value Ref Range Status  01/17/2023 0.70 0.57 - 1.00 mg/dL Final         Passed - Valid encounter within last 12 months    Recent Outpatient Visits           3 months ago Hypertension associated with diabetes (HCC)   [place]  [facility] [name] T, FNP   9 months ago Type 2 diabetes mellitus with other diabetic kidney complication [facility])   [place] [facility] Jacky Kindle, FNP   11 months ago Chronic cough   [place] [facility] [place], [place], [place]   12 months ago COPD exacerbation [facility])   [place] [facility] [name] T, FNP   1 year ago Annual physical exam   [facility] [name] T, FNP       Future Appointments             In 2 months Jacky Kindle, FNP [place] [facility], [facility]             metoprolol (TOPROL-XL) 200 MG 24 hr tablet [Pharmacy Med Name: METOPROLOL SUCC ER 200 MG TAB] 90 tablet 0    Sig: TAKE 1 TABLET BY MOUTH EVERY DAY     Cardiovascular:  Beta Blockers Failed - 04/22/2023  7:52 AM      Failed - Last BP in normal range    BP Readings from Last 1 Encounters:  01/17/23 (!) 160/84         Passed - Last Heart Rate in normal range    Pulse Readings from Last 1 Encounters:  01/17/23 69         Passed - Valid encounter within last 6 months    Recent Outpatient Visits           3 months ago Hypertension associated with diabetes [facility])   [place] [facility] [name] T, FNP   9 months ago Type 2 diabetes mellitus with other diabetic kidney complication [facility])   [place] [facility] Jacky Kindle, FNP   11 months ago Chronic cough   [place] [facility] [place], [place], PA-C   12 months ago COPD exacerbation [facility])    [facility] [name] T, FNP   1 year ago Annual physical exam   [facility] Jacky Kindle, FNP       Future Appointments             In 2 months Jacky Kindle, FNP [facility], [facility]

## 2023-04-28 ENCOUNTER — Other Ambulatory Visit: Payer: Self-pay

## 2023-04-28 DIAGNOSIS — F17210 Nicotine dependence, cigarettes, uncomplicated: Secondary | ICD-10-CM

## 2023-04-28 DIAGNOSIS — Z87891 Personal history of nicotine dependence: Secondary | ICD-10-CM

## 2023-05-08 ENCOUNTER — Ambulatory Visit: Payer: PPO

## 2023-05-08 VITALS — BP 140/65 | HR 74

## 2023-05-08 DIAGNOSIS — E1169 Type 2 diabetes mellitus with other specified complication: Secondary | ICD-10-CM

## 2023-05-08 DIAGNOSIS — E559 Vitamin D deficiency, unspecified: Secondary | ICD-10-CM

## 2023-05-08 DIAGNOSIS — I152 Hypertension secondary to endocrine disorders: Secondary | ICD-10-CM

## 2023-05-08 NOTE — Progress Notes (Signed)
Care Management & Coordination Services Pharmacy Note  05/08/2023 Name:  Heather Burke MRN:  [id number] DOB:  [birth year]  Summary: Patient presents for follow-up consult.   -Patient reports she stopped her rosuvastatin and has been adherent with her Vascepa. She denies further episodes of palpitations. Energy was improving since she stopped rosuvastatin, but with weather changes and a recent cyst she is not back at her baseline.   Recommendations/Changes made from today's visit: -Recheck Lipid profile as well as B12 and D3 levels to assess for possible secondary causes of low energy.   Follow up plan: CPP follow-up 1 month   Subjective: Heather Burke is an 72 y.o. year old female who is a primary patient of Heather Kindle, FNP.  The care coordination team was consulted for assistance with disease management and care coordination needs.    Engaged with patient by telephone for follow up visit.  Recent office visits: 01/30/202 Heather Norton, FNP (PCP Office Visit) for Follow-up- Started: Bupropion HCl 150 mg daily, Diltiazem HCl 240 mg daily, Lab orders placed, Referral Lung Cancer Screening placed, BP: 160/84,    12/22/2022 AWV  Recent consult visits: None ID  Hospital visits: None in previous 6 months   Objective:  Lab Results  Component Value Date   CREATININE 0.70 01/17/2023   BUN 15 01/17/2023   EGFR 93 01/17/2023   GFRNONAA 60 12/04/2019   GFRAA 69 12/04/2019   NA 139 01/17/2023   K 3.9 01/17/2023   CALCIUM 10.3 01/17/2023   CO2 21 01/17/2023   GLUCOSE 152 (H) 01/17/2023    Lab Results  Component Value Date/Time   HGBA1C 6.9 (H) 01/17/2023 09:36 AM   HGBA1C 6.2 (H) 07/14/2022 10:30 AM   MICROALBUR Negative 09/29/2015 11:26 AM    Last diabetic Eye exam:  Lab Results  Component Value Date/Time   HMDIABEYEEXA No Retinopathy 06/28/2021 12:00 AM    Last diabetic Foot exam: No results found for: "HMDIABFOOTEX"   Lab Results  Component Value Date    CHOL 182 01/17/2023   HDL 35 (L) 01/17/2023   LDLCALC 77 01/17/2023   TRIG 441 (H) 01/17/2023   CHOLHDL 5.2 (H) 01/17/2023       Latest Ref Rng & Units 07/14/2022   10:30 AM 01/14/2022   10:32 AM 06/17/2021    1:38 PM  Hepatic Function  Total Protein 6.0 - 8.5 g/dL 7.8  7.8  7.5   Albumin 3.9 - 4.9 g/dL 4.4  4.5  4.1   AST 0 - 40 IU/L 27  28  26    ALT 0 - 32 IU/L 29  31  34   Alk Phosphatase 44 - 121 IU/L 81  74  76   Total Bilirubin 0.0 - 1.2 mg/dL 0.4  0.3  0.4     Lab Results  Component Value Date/Time   TSH 1.560 07/14/2022 10:30 AM   TSH 1.440 01/14/2022 10:32 AM   FREET4 1.49 07/14/2022 10:30 AM   FREET4 1.45 01/14/2022 10:32 AM       Latest Ref Rng & Units 01/17/2023    9:36 AM 12/04/2019   10:11 AM 09/18/2018    9:12 AM  CBC  WBC 3.4 - 10.8 x10E3/uL 7.1  7.0  6.6   Hemoglobin 11.1 - 15.9 g/dL 65.7  84.6  96.2   Hematocrit 34.0 - 46.6 % 44.1  43.8  43.5   Platelets 150 - 450 x10E3/uL 232  237  241     Lab Results  Component  Value Date/Time   VD25OH 48.2 09/18/2018 09:12 AM   VD25OH 52.7 04/20/2016 08:04 AM    Clinical ASCVD: No  The 10-year ASCVD risk score (Arnett DK, et al., 2019) is: 51.2%   Values used to calculate the score:     Age: 72 years     Sex: Female     Is Non-Hispanic African American: No     Diabetic: Yes     Tobacco smoker: Yes     Systolic Blood Pressure: 160 mmHg     Is BP treated: Yes     HDL Cholesterol: 35 mg/dL     Total Cholesterol: 182 mg/dL       12/24/1094    0:45 PM 07/14/2022    9:50 AM 12/16/2021    9:04 AM  Depression screen PHQ 2/9  Decreased Interest 0 0 0  Down, Depressed, Hopeless 0 0 0  PHQ - 2 Score 0 0 0  Altered sleeping 0    Tired, decreased energy 0    Change in appetite 0    Feeling bad or failure about yourself  0    Trouble concentrating 0    Moving slowly or fidgety/restless 0    Suicidal thoughts 0    PHQ-9 Score 0    Difficult doing work/chores Not difficult at all       Social History    Tobacco Use  Smoking Status Every Day   Packs/day: 2.00   Years: 49.00   Additional pack years: 0.00   Total pack years: 98.00   Types: Cigarettes  Smokeless Tobacco Never   BP Readings from Last 3 Encounters:  01/17/23 (!) 160/84  07/14/22 127/62  04/27/22 136/70   Pulse Readings from Last 3 Encounters:  01/17/23 69  07/14/22 71  04/27/22 80   Wt Readings from Last 3 Encounters:  01/17/23 172 lb 3.2 oz (78.1 kg)  12/22/22 164 lb (74.4 kg)  07/14/22 164 lb 1.6 oz (74.4 kg)   BMI Readings from Last 3 Encounters:  01/17/23 30.50 kg/m  12/22/22 29.05 kg/m  07/14/22 29.07 kg/m    Allergies  Allergen Reactions   Amlodipine Nausea Only and Other (See Comments)    Stomach cramping   Atorvastatin Nausea And Vomiting   Lisinopril Cough   Pravastatin Nausea And Vomiting   Icosapent Ethyl Palpitations   Nifedipine Er Other (See Comments)    headache    Medications Reviewed Today     Reviewed by Heather Kindle, FNP (Family Nurse Practitioner) on 01/17/23 at [phone]  Med List Status: <None>   Medication Order Taking? Sig Documenting Provider Last Dose Status Informant  aspirin 81 MG tablet [id number] Yes Take 81 mg by mouth daily. [provider] Taking Active   buPROPion (WELLBUTRIN XL) 150 MG 24 hr tablet [id number] Yes Take 1 tablet (150 mg total) by mouth daily. Heather Burke T, FNP  Active   CANNABIDIOL PO [id number] Yes Take 500 mg by mouth daily. [provider] Taking Active   diltiazem (DILT-XR) 240 MG 24 hr capsule [id number]  Take 1 capsule (240 mg total) by mouth daily. Heather Kindle, FNP  Active   Dulaglutide (TRULICITY) 1.5 MG/0.5ML Namon Cirri [id number] Yes Inject 1.5 mg into the skin once a week. Via [employer] patient assistance through Dec 2023 Heather Burke, Heather Schlein, MD Taking Active   fluticasone [facility]) 50 MCG/ACT nasal spray [id number] Yes SPRAY 2 SPRAYS INTO EACH NOSTRIL EVERY DAY Heather Kindle, FNP Taking Active   KLOR-CON M10 10 MEQ  tablet  [id number] Yes TAKE 1 TABLET BY MOUTH EVERY DAY Heather Kindle, FNP Taking Active   latanoprost (XALATAN) 0.005 % ophthalmic solution [id number] Yes 1 drop at bedtime. [provider] Taking Active   levocetirizine (XYZAL) 5 MG tablet [id number] No Take 5 mg by mouth as needed.  Patient not taking: Reported on 01/17/2023   [provider] Not Taking Active   levothyroxine (SYNTHROID) 88 MCG tablet [id number] Yes TAKE 1 TABLET BY MOUTH EVERY DAY Heather Kindle, FNP Taking Active   losartan-hydrochlorothiazide [facility]) 100-25 MG tablet [id number] Yes TAKE 1 TABLET BY MOUTH EVERY DAY Heather Kindle, FNP Taking Active   lovastatin (MEVACOR) 20 MG tablet [id number] Yes TAKE 1 TABLET BY MOUTH EVERYDAY AT BEDTIME Heather Kindle, FNP Taking Active   metoprolol (TOPROL-XL) 200 MG 24 hr tablet [id number] Yes TAKE 1 TABLET BY MOUTH EVERY DAY Heather Kindle, FNP Taking Active   Misc Natural Products (T-RELIEF CBD+13 SL) [id number] Yes Place 500 mg under the tongue daily. [provider] Taking Active Self  Nutritional Supplements (QUINOA/KALE/HEMP PO) [id number] Yes Place 750 mg under the tongue. [provider] Taking Active   Omega 3-5-6-7-9 Fatty Acids (COMPLETE OMEGA) CAPS [id number] Yes Take by mouth. Vitalite capsules [provider] Taking Active             SDOH:  (Social Determinants of Health) assessments and interventions performed: Yes SDOH Interventions    Flowsheet Row Clinical Support from 12/22/2022 in [facility] Chronic Care Management from 02/21/2022 in [facility] Clinical Support from 12/16/2021 in [facility] Chronic Care Management from 11/23/2021 in [facility] Chronic Care Management from 09/14/2021 in [facility] Chronic Care Management from 08/10/2021 in [facility]  SDOH Interventions         Food Insecurity Interventions Intervention Not Indicated -- Intervention Not Indicated -- -- --  Housing Interventions Intervention Not Indicated -- Intervention Not Indicated -- -- --  Transportation Interventions Intervention Not Indicated -- Intervention Not Indicated -- -- --  Utilities Interventions Intervention Not Indicated -- -- -- -- --  Alcohol Usage Interventions Intervention Not Indicated (Score <7) -- -- -- -- --  Financial Strain Interventions Intervention Not Indicated Intervention Not Indicated Intervention Not Indicated Intervention Not Indicated Intervention Not Indicated Intervention Not Indicated  Physical Activity Interventions Intervention Not Indicated -- Intervention Not Indicated -- -- --  Stress Interventions Intervention Not Indicated -- Intervention Not Indicated -- -- --  Social Connections Interventions Intervention Not Indicated -- Intervention Not Indicated -- -- --       Medication Assistance: None required.  Patient affirms current coverage meets needs.  Medication Access: Within the past 30 days, how often has patient missed a dose of medication? None Is a pillbox or other method used to improve adherence? No  Factors that may affect medication adherence? no barriers identified Are meds synced by current pharmacy? No  Are meds delivered by current pharmacy? No  Does patient experience delays in picking up medications due to transportation concerns? No   [facility] Services Reviewed: Is patient disadvantaged to use [facility]?: Yes  Current Rx insurance plan: HTA [id number] [facility] [name] [place] [address] Phone: [phone] Fax: [phone]  [facility] services reviewed with patient today?: No  Patient requests to transfer care to [facility] Pharm[facility]?:  No  Reason patient declined to change pharmacies: Disadvantaged due to insurance/mail  order  Compliance/Adherence/Medication fill history: Care Gaps: Foot Exam  Ophthalmology Exam  Covid   Star-Rating Drugs: Losartan-HCTZ 100-25 mg: Last filled 02/26/23 for 90-DS    Assessment/Plan  Hypertension (BP goal <130/80) -Uncontrolled -History of heart murmur -Current treatment: Diltiazem XR 240 mg daily: Appropriate, Effective, Safe, Accessible Losartan-HCTZ 100-25 mg daily: Appropriate, Effective, Safe, Accessible  Metoprolol XL 200 mg daily: Appropriate, Effective, Safe, Accessible  -Medications previously tried: Amlodipine (nausea), Olmesartan, clonidine, nifedipine, Lisinopril (cough)  -Current home readings:  140/65; HR 74  138/74; HR 74  148/72; HR 80  142/76; HR 76  -Denies hypotensive/hypertensive symptoms -Query appropriate use of metoprolol without compelling secondary HTN indication. Patient has been sensitive to alternative agents that have been tried in the past. High-dose Beta-blocker could be contributing to patient's fatigue.  -Recommended to continue current medication  Hyperlipidemia: (LDL goal < 70) -Uncontrolled -Current treatment: Vascepa 2 g twice daily  -Medications previously tried: Vascepa (racing heart), Lovastatin, Rosuvastatin (weakness)    -Patient reports she stopped her rosuvastatin and has been adherent with her Vascepa. She denies further episodes of palpitations. Energy was improving since she stopped rosuvastatin, but with weather changes and a recent cyst she is not back at her baseline.  -Recheck Lipid profile as well as B12 and D3 levels to assess for possible secondary causes of low energy.   Diabetes (A1c goal <7%) -Controlled -Current medications: Trulicity 1.5 mg weekly  -Medications previously tried: NA  -Current home glucose readings fasting glucose: Not routinely monitoring  -Denies hypoglycemic/hyperglycemic symptoms -Working on weight loss via [employer]. Lost 36 pounds since starting.  -Recommended to continue  current medication  Hypothyroidism (Goal: Maintain stable thyroid function) -Controlled -Current treatment  Levothyroxine 88 mcg daily  -Medications previously tried: NA  -Recommended to continue current medication  Allergic Rhinitis (Goal: Minimize symptoms) -Controlled -Current treatment  Flonase 50 mcg/act 2 sprays nightly Xyzal 5 mg nightly   -Medications previously tried: NA -Scratchy throat, otherwise states symptoms well controlled -Continue current medications  Tobacco use (Goal Quit smoking) -Uncontrolled -Currently smoking 2 ppd  -Previous quit attempts: Chantix (vivid dreams, poor sleep).  -Current treatment  None  -Patient smokes Within 30 minutes of waking -Patient triggers include: finishing a meal and drinking coffee and seeing someone else smoke -Patient never started Bupropion. She states she is not ready to try and cut down on her cigarette use.  -Patient in a pre-contemplative stage of action for tobacco cessation   Follow Up Plan: Telephone follow up appointment with care management team member scheduled for:  05/08/23 at 1:00 PM  Angelena Sole, PharmD, [place]y [place], CPP  Clinical Pharmacist Practitioner  [facility] [phone]

## 2023-05-09 ENCOUNTER — Other Ambulatory Visit: Payer: Self-pay | Admitting: Family Medicine

## 2023-05-09 DIAGNOSIS — E1169 Type 2 diabetes mellitus with other specified complication: Secondary | ICD-10-CM

## 2023-05-09 DIAGNOSIS — R5383 Other fatigue: Secondary | ICD-10-CM | POA: Insufficient documentation

## 2023-05-09 DIAGNOSIS — E559 Vitamin D deficiency, unspecified: Secondary | ICD-10-CM

## 2023-05-11 ENCOUNTER — Other Ambulatory Visit: Payer: Self-pay | Admitting: Family Medicine

## 2023-05-11 DIAGNOSIS — I152 Hypertension secondary to endocrine disorders: Secondary | ICD-10-CM | POA: Diagnosis not present

## 2023-05-11 DIAGNOSIS — E1169 Type 2 diabetes mellitus with other specified complication: Secondary | ICD-10-CM | POA: Diagnosis not present

## 2023-05-11 DIAGNOSIS — E785 Hyperlipidemia, unspecified: Secondary | ICD-10-CM | POA: Diagnosis not present

## 2023-05-11 DIAGNOSIS — E559 Vitamin D deficiency, unspecified: Secondary | ICD-10-CM | POA: Diagnosis not present

## 2023-05-12 LAB — VITAMIN D 25 HYDROXY (VIT D DEFICIENCY, FRACTURES): Vit D, 25-Hydroxy: 34.6 ng/mL (ref 30.0–100.0)

## 2023-05-12 LAB — LIPID PANEL
Chol/HDL Ratio: 6.6 ratio — ABNORMAL HIGH (ref 0.0–4.4)
Cholesterol, Total: 197 mg/dL (ref 100–199)
HDL: 30 mg/dL — ABNORMAL LOW (ref >39)
LDL Chol Calc (NIH): 102 mg/dL — ABNORMAL HIGH (ref 0–99)
Triglycerides: 382 mg/dL — ABNORMAL HIGH (ref 0–149)
VLDL Cholesterol Cal: 65 mg/dL — ABNORMAL HIGH (ref 5–40)

## 2023-05-12 LAB — MICROALBUMIN / CREATININE URINE RATIO
Creatinine, Urine: 15.8 mg/dL
Microalb/Creat Ratio: <19 mg/g creat (ref 0–29)
Microalbumin, Urine: <3 ug/mL

## 2023-05-12 LAB — B12 AND FOLATE PANEL
Folate: >20 ng/mL (ref >3.0)
Vitamin B-12: 1005 pg/mL (ref 232–1245)

## 2023-05-13 NOTE — Progress Notes (Signed)
Stable urine micro. Repeat every 1 year unless concerning findings prseent.

## 2023-05-13 NOTE — Progress Notes (Signed)
Cholesterol is increased; LDL is now >100. The 10-year ASCVD risk score (Arnett DK, et al., 2019) is: 43.9% B12 and folate stable. Borderine Vit D. Recommend 5000 IU daily OTC Vit D 3.

## 2023-05-16 DIAGNOSIS — E113393 Type 2 diabetes mellitus with moderate nonproliferative diabetic retinopathy without macular edema, bilateral: Secondary | ICD-10-CM | POA: Diagnosis not present

## 2023-05-26 ENCOUNTER — Other Ambulatory Visit: Payer: Self-pay | Admitting: Family Medicine

## 2023-05-26 DIAGNOSIS — F17200 Nicotine dependence, unspecified, uncomplicated: Secondary | ICD-10-CM

## 2023-05-26 NOTE — Telephone Encounter (Signed)
Requested Prescriptions  Refused Prescriptions Disp Refills   buPROPion (WELLBUTRIN XL) 150 MG 24 hr tablet [Pharmacy Med Name: BUPROPION HCL XL 150 MG TABLET] 90 tablet 1    Sig: TAKE 1 TABLET BY MOUTH EVERY DAY     Psychiatry: Antidepressants - bupropion Failed - 05/26/2023  2:30 AM      Failed - Last BP in normal range    BP Readings from Last 1 Encounters:  05/08/23 (!) 140/65         Passed - Cr in normal range and within 360 days    Creatinine, Ser  Date Value Ref Range Status  01/17/2023 0.70 0.57 - 1.00 mg/dL Final         Passed - AST in normal range and within 360 days    AST  Date Value Ref Range Status  07/14/2022 27 0 - 40 IU/L Final         Passed - ALT in normal range and within 360 days    ALT  Date Value Ref Range Status  07/14/2022 29 0 - 32 IU/L Final         Passed - Valid encounter within last 6 months    Recent Outpatient Visits           4 months ago Hypertension associated with diabetes [facility])   [place] [facility] [name] T, FNP   10 months ago Type 2 diabetes mellitus with other diabetic kidney complication [facility])   [place] [facility] Jacky Kindle, FNP   1 year ago Chronic cough   [place] [facility] [place], [place], PA-C   1 year ago COPD exacerbation [facility])   [place] [facility] [name] T, FNP   1 year ago Annual physical exam   [facility] Jacky Kindle, FNP       Future Appointments             In 1 month Jacky Kindle, FNP [facility], [facility]

## 2023-05-30 ENCOUNTER — Ambulatory Visit
Admission: RE | Admit: 2023-05-30 | Discharge: 2023-05-30 | Disposition: A | Discharge status: Home / Self Care | Payer: PPO | Source: Ambulatory Visit | Attending: Acute Care | Admitting: Acute Care

## 2023-05-30 DIAGNOSIS — Z87891 Personal history of nicotine dependence: Secondary | ICD-10-CM | POA: Insufficient documentation

## 2023-05-30 DIAGNOSIS — F17210 Nicotine dependence, cigarettes, uncomplicated: Secondary | ICD-10-CM | POA: Diagnosis not present

## 2023-06-05 ENCOUNTER — Other Ambulatory Visit: Payer: Self-pay | Admitting: Acute Care

## 2023-06-05 DIAGNOSIS — Z122 Encounter for screening for malignant neoplasm of respiratory organs: Secondary | ICD-10-CM

## 2023-06-05 DIAGNOSIS — F17210 Nicotine dependence, cigarettes, uncomplicated: Secondary | ICD-10-CM

## 2023-06-05 DIAGNOSIS — Z87891 Personal history of nicotine dependence: Secondary | ICD-10-CM

## 2023-06-12 ENCOUNTER — Other Ambulatory Visit: Payer: Self-pay | Admitting: Family Medicine

## 2023-06-12 DIAGNOSIS — E876 Hypokalemia: Secondary | ICD-10-CM

## 2023-06-12 DIAGNOSIS — I152 Hypertension secondary to endocrine disorders: Secondary | ICD-10-CM

## 2023-06-13 ENCOUNTER — Other Ambulatory Visit: Payer: Self-pay | Admitting: Family Medicine

## 2023-06-13 ENCOUNTER — Telehealth: Payer: Self-pay | Admitting: Family Medicine

## 2023-06-13 DIAGNOSIS — E039 Hypothyroidism, unspecified: Secondary | ICD-10-CM

## 2023-06-13 DIAGNOSIS — R928 Other abnormal and inconclusive findings on diagnostic imaging of breast: Secondary | ICD-10-CM

## 2023-06-13 NOTE — Telephone Encounter (Signed)
Pt is calling in because she needs for Robynn Pane to schedule her 6 month Mammogram. Pt says she tried to send a message on MyChart but it didn't go through. Please advise.

## 2023-06-13 NOTE — Telephone Encounter (Signed)
Medication Refill - Medication: levothyroxine (SYNTHROID) 88 MCG tablet [[id number]]   Has the patient contacted their pharmacy? Yes.     (Agent: If yes, when and what did the pharmacy advise?) Contact PCP   [facility]erred Pharm[facility] [address] [place] [address]   Has the patient been seen for an appointment in the last year OR does the patient have an upcoming appointment? Yes.    Agent: Please be advised that RX refills may take up to 3 business days. We ask that you follow-up with your pharmacy.   Pt has 3 pills left.

## 2023-06-13 NOTE — Telephone Encounter (Signed)
Requested Prescriptions  Pending Prescriptions Disp Refills   DILT-XR 240 MG 24 hr capsule [Pharmacy Med Name: DILT XR 240 MG CAPSULE] 90 capsule 0    Sig: TAKE 1 CAPSULE BY MOUTH EVERY DAY     Cardiovascular: Calcium Channel Blockers 3 Failed - 06/12/2023  4:16 PM      Failed - Last BP in normal range    BP Readings from Last 1 Encounters:  05/08/23 (!) 140/65         Passed - ALT in normal range and within 360 days    ALT  Date Value Ref Range Status  07/14/2022 29 0 - 32 IU/L Final         Passed - AST in normal range and within 360 days    AST  Date Value Ref Range Status  07/14/2022 27 0 - 40 IU/L Final         Passed - Cr in normal range and within 360 days    Creatinine, Ser  Date Value Ref Range Status  01/17/2023 0.70 0.57 - 1.00 mg/dL Final         Passed - Last Heart Rate in normal range    Pulse Readings from Last 1 Encounters:  05/08/23 74         Passed - Valid encounter within last 6 months    Recent Outpatient Visits           4 months ago Hypertension associated with diabetes [facility])   [place] [facility] [name] T, FNP   11 months ago Type 2 diabetes mellitus with other diabetic kidney complication [facility])   [place] [facility] [name] T, FNP   1 year ago Chronic cough   [place] [facility] [place], [place], PA-C   1 year ago COPD exacerbation [facility])   [place] [facility] [name] T, FNP   1 year ago Annual physical exam   [place] [facility] [name] T, FNP       Future Appointments             In 1 month Jacky Kindle, FNP [place] [facility]             losartan-hydrochlorothiazide (HYZAAR) 100-25 MG tablet [Pharmacy Med Name: LOSARTAN-HCTZ 100-25 MG TAB] 90 tablet 0    Sig: TAKE 1 TABLET BY MOUTH EVERY DAY     Cardiovascular: ARB + Diuretic Combos Failed - 06/12/2023  4:16 PM      Failed - Last BP in  normal range    BP Readings from Last 1 Encounters:  05/08/23 (!) 140/65         Passed - K in normal range and within 180 days    Potassium  Date Value Ref Range Status  01/17/2023 3.9 3.5 - 5.2 mmol/L Final         Passed - Na in normal range and within 180 days    Sodium  Date Value Ref Range Status  01/17/2023 139 134 - 144 mmol/L Final         Passed - Cr in normal range and within 180 days    Creatinine, Ser  Date Value Ref Range Status  01/17/2023 0.70 0.57 - 1.00 mg/dL Final         Passed - eGFR is 10 or above and within 180 days    GFR calc Af Den[place]se Dago  Date Value Ref Range Status  12/04/2019 69 >59 mL/min/1.73 Final   GFR calc non Af Amer  Date Value Ref Range Status  12/04/2019 60 >59 mL/min/1.73 Final   eGFR  Date Value Ref Range Status  01/17/2023 93 >59 mL/min/1.73 Final         Passed - Patient is not pregnant      Passed - Valid encounter within last 6 months    Recent Outpatient Visits           4 months ago Hypertension associated with diabetes [facility])   [place] [facility] [name] T, FNP   11 months ago Type 2 diabetes mellitus with other diabetic kidney complication [facility])   [place] [facility] Jacky Kindle, FNP   1 year ago Chronic cough   [place] [facility] [place], [place], PA-C   1 year ago COPD exacerbation [facility])   [facility] [name] T, FNP   1 year ago Annual physical exam   [place] [facility] [name] T, FNP       Future Appointments             In 1 month Jacky Kindle, FNP [place], [facility]             KLOR-CON M10 10 MEQ tablet [Pharmacy Med Name: KLOR-CON M10 TABLET] 90 tablet 1    Sig: TAKE 1 TABLET BY MOUTH EVERY DAY     Endocrinology:  Minerals - Potassium Supplementation Passed - 06/12/2023  4:16 PM      Passed - K in normal range and within 360 days    Potassium  Date  Value Ref Range Status  01/17/2023 3.9 3.5 - 5.2 mmol/L Final         Passed - Cr in normal range and within 360 days    Creatinine, Ser  Date Value Ref Range Status  01/17/2023 0.70 0.57 - 1.00 mg/dL Final         Passed - Valid encounter within last 12 months    Recent Outpatient Visits           4 months ago Hypertension associated with diabetes [facility])   [place] [facility] [name] T, FNP   11 months ago Type 2 diabetes mellitus with other diabetic kidney complication [facility])   [place] [facility] [name] T, FNP   1 year ago Chronic cough   [place] [facility] [place], [place], PA-C   1 year ago COPD exacerbation [facility])   [place] [facility] [name] T, FNP   1 year ago Annual physical exam   [place] [facility] Jacky Kindle, FNP       Future Appointments             In 1 month Jacky Kindle, FNP [facility]             metoprolol (TOPROL-XL) 200 MG 24 hr tablet [Pharmacy Med Name: METOPROLOL SUCC ER 200 MG TAB] 90 tablet 0    Sig: TAKE 1 TABLET BY MOUTH EVERY DAY     Cardiovascular:  Beta Blockers Failed - 06/12/2023  4:16 PM      Failed - Last BP in normal range    BP Readings from Last 1 Encounters:  05/08/23 (!) 140/65         Passed - Last Heart Rate in normal  range    Pulse Readings from Last 1 Encounters:  05/08/23 74         Passed - Valid encounter within last 6 months    Recent Outpatient Visits           4 months ago Hypertension associated with diabetes [facility])   [place] [facility] [name] T, FNP   11 months ago Type 2 diabetes mellitus with other diabetic kidney complication [facility])   [place] [facility] Jacky Kindle, FNP   1 year ago Chronic cough   [place] [facility] [place], [place], PA-C   1 year ago COPD exacerbation [facility])   [place]  [facility] Jacky Kindle, FNP   1 year ago Annual physical exam   [facility] Jacky Kindle, FNP       Future Appointments             In 1 month Jacky Kindle, FNP [facility], [facility]

## 2023-06-14 MED ORDER — LEVOTHYROXINE SODIUM 88 MCG PO TABS
88.0000 ug | ORAL_TABLET | Freq: Every day | ORAL | 1 refills | Status: DC
Start: 2023-06-14 — End: 2023-12-06

## 2023-06-14 NOTE — Telephone Encounter (Signed)
Requested Prescriptions  Pending Prescriptions Disp Refills   levothyroxine (SYNTHROID) 88 MCG tablet 90 tablet 3    Sig: Take 1 tablet (88 mcg total) by mouth daily.     Endocrinology:  Hypothyroid Agents Passed - 06/13/2023 10:57 AM      Passed - TSH in normal range and within 360 days    TSH  Date Value Ref Range Status  07/14/2022 1.560 0.450 - 4.500 uIU/mL Final         Passed - Valid encounter within last 12 months    Recent Outpatient Visits           4 months ago Hypertension associated with diabetes [facility])   [place] [facility] [name] T, FNP   11 months ago Type 2 diabetes mellitus with other diabetic kidney complication [facility])   [place] [facility] Jacky Kindle, FNP   1 year ago Chronic cough   [place] [facility] [place], [place], PA-C   1 year ago COPD exacerbation [facility])   [place] [facility] Jacky Kindle, FNP   1 year ago Annual physical exam   [facility] Jacky Kindle, FNP       Future Appointments             In 1 month Jacky Kindle, FNP [facility], [facility]

## 2023-06-14 NOTE — Telephone Encounter (Signed)
Heather Kindle, FNP 01/06/2023  8:11 PM EST     6 month breast follow up recommended to evaluate possible calcifications.   Is this the correct order? Please advise

## 2023-06-15 ENCOUNTER — Other Ambulatory Visit: Payer: Self-pay | Admitting: Family Medicine

## 2023-06-15 DIAGNOSIS — R921 Mammographic calcification found on diagnostic imaging of breast: Secondary | ICD-10-CM

## 2023-06-15 DIAGNOSIS — R928 Other abnormal and inconclusive findings on diagnostic imaging of breast: Secondary | ICD-10-CM

## 2023-06-15 NOTE — Telephone Encounter (Signed)
Removed order as it looks like it was ordered for cosign by Robynn Pane today. It was the correct order though.

## 2023-06-29 ENCOUNTER — Other Ambulatory Visit: Payer: Self-pay | Admitting: Family Medicine

## 2023-06-29 DIAGNOSIS — R921 Mammographic calcification found on diagnostic imaging of breast: Secondary | ICD-10-CM

## 2023-06-29 DIAGNOSIS — R928 Other abnormal and inconclusive findings on diagnostic imaging of breast: Secondary | ICD-10-CM

## 2023-07-20 ENCOUNTER — Ambulatory Visit: Payer: PPO | Admitting: Family Medicine

## 2023-07-26 ENCOUNTER — Other Ambulatory Visit: Payer: PPO | Admitting: Pharmacist

## 2023-07-31 ENCOUNTER — Encounter: Payer: Self-pay | Admitting: Family Medicine

## 2023-08-01 ENCOUNTER — Other Ambulatory Visit: Payer: PPO

## 2023-08-08 ENCOUNTER — Ambulatory Visit
Admission: RE | Admit: 2023-08-08 | Discharge: 2023-08-08 | Disposition: A | Discharge status: Home / Self Care | Payer: PPO | Source: Ambulatory Visit | Attending: Family Medicine | Admitting: Family Medicine

## 2023-08-08 DIAGNOSIS — R928 Other abnormal and inconclusive findings on diagnostic imaging of breast: Secondary | ICD-10-CM | POA: Diagnosis not present

## 2023-08-08 DIAGNOSIS — R92321 Mammographic fibroglandular density, right breast: Secondary | ICD-10-CM | POA: Diagnosis not present

## 2023-08-08 DIAGNOSIS — R921 Mammographic calcification found on diagnostic imaging of breast: Secondary | ICD-10-CM

## 2023-08-09 ENCOUNTER — Encounter: Payer: Self-pay | Admitting: Family Medicine

## 2023-08-09 ENCOUNTER — Ambulatory Visit (INDEPENDENT_AMBULATORY_CARE_PROVIDER_SITE_OTHER): Payer: PPO | Admitting: Family Medicine

## 2023-08-09 VITALS — BP 165/77 | HR 73 | Ht 62.0 in | Wt 165.1 lb

## 2023-08-09 DIAGNOSIS — I152 Hypertension secondary to endocrine disorders: Secondary | ICD-10-CM

## 2023-08-09 DIAGNOSIS — E1169 Type 2 diabetes mellitus with other specified complication: Secondary | ICD-10-CM | POA: Diagnosis not present

## 2023-08-09 DIAGNOSIS — N6089 Other benign mammary dysplasias of unspecified breast: Secondary | ICD-10-CM | POA: Diagnosis not present

## 2023-08-09 DIAGNOSIS — D492 Neoplasm of unspecified behavior of bone, soft tissue, and skin: Secondary | ICD-10-CM

## 2023-08-09 DIAGNOSIS — E1159 Type 2 diabetes mellitus with other circulatory complications: Secondary | ICD-10-CM

## 2023-08-09 DIAGNOSIS — F4329 Adjustment disorder with other symptoms: Secondary | ICD-10-CM | POA: Diagnosis not present

## 2023-08-09 DIAGNOSIS — F17200 Nicotine dependence, unspecified, uncomplicated: Secondary | ICD-10-CM | POA: Diagnosis not present

## 2023-08-09 DIAGNOSIS — E1129 Type 2 diabetes mellitus with other diabetic kidney complication: Secondary | ICD-10-CM

## 2023-08-09 DIAGNOSIS — F1020 Alcohol dependence, uncomplicated: Secondary | ICD-10-CM | POA: Diagnosis not present

## 2023-08-09 DIAGNOSIS — E785 Hyperlipidemia, unspecified: Secondary | ICD-10-CM

## 2023-08-09 MED ORDER — VALSARTAN-HYDROCHLOROTHIAZIDE 320-12.5 MG PO TABS
1.0000 | ORAL_TABLET | Freq: Every day | ORAL | 3 refills | Status: DC
Start: 2023-08-09 — End: 2024-03-16

## 2023-08-09 NOTE — Assessment & Plan Note (Signed)
Chronic, stable Continue to monitor; pt denies concern for cessation efforts at this time

## 2023-08-09 NOTE — Assessment & Plan Note (Signed)
Chronic, previously elevated Repeat LP LDL goal <70 recommend diet low in saturated fat and regular exercise - 30 min at least 5 times per week

## 2023-08-09 NOTE — Patient Instructions (Signed)
Complete current Rx for Hyzaar (Losartan-hydrochlorothiazide) once complete, switch to new Rx of Diovan HCT.  BP goal of <120/<80 given known elevated blood sugars and elevated cholesterol.  The 10-year ASCVD risk score (Arnett DK, et al., 2019) is: 57.2%   Values used to calculate the score:     Age: 72 years     Sex: Female     Is Non-Hispanic African American: No     Diabetic: Yes     Tobacco smoker: Yes     Systolic Blood Pressure: 164 mmHg     Is BP treated: Yes     HDL Cholesterol: 30 mg/dL     Total Cholesterol: 197 mg/dL

## 2023-08-09 NOTE — Progress Notes (Signed)
Established patient visit   Patient: Heather Burke   DOB: 10/18/1951   72 y.o. Female  MRN: [id number] Visit Date: 08/09/2023  Today's healthcare provider: Jacky Kindle, FNP  Introduced to nurse practitioner role and practice setting.  All questions answered.  Discussed provider/patient relationship and expectations.  Subjective    HPI  Chronic follow up Notes 1 month ago her sister passed; this was her only sibling She notes lack of appetite, grief and overall depressed mood during this time. Continues to decline use of mood assistance medication to assist given this time. Reports previous use of Xanax when her husband passed and notes that she had to continue medication >2 years and does not wish to restart.  Medications: Outpatient Medications Prior to Visit  Medication Sig   aspirin 81 MG tablet Take 81 mg by mouth daily.   CANNABIDIOL PO Take 500 mg by mouth daily.   diltiazem (DILT-XR) 240 MG 24 hr capsule TAKE 1 CAPSULE BY MOUTH EVERY DAY   Dulaglutide (TRULICITY) 1.5 MG/0.5ML SOPN Inject 1.5 mg into the skin once a week. Via [employer] patient assistance through Dec 2023   fluticasone (FLONASE) 50 MCG/ACT nasal spray SPRAY 2 SPRAYS INTO EACH NOSTRIL EVERY DAY   icosapent Ethyl (VASCEPA) 1 g capsule Take 2 g by mouth 2 (two) times daily.   latanoprost (XALATAN) 0.005 % ophthalmic solution 1 drop at bedtime.   levocetirizine (XYZAL) 5 MG tablet Take 5 mg by mouth as needed.   levothyroxine (SYNTHROID) 88 MCG tablet Take 1 tablet (88 mcg total) by mouth daily.   metoprolol (TOPROL-XL) 200 MG 24 hr tablet TAKE 1 TABLET BY MOUTH EVERY DAY   Misc Natural Products (T-RELIEF CBD+13 SL) Place 500 mg under the tongue daily.   Nutritional Supplements (QUINOA/KALE/HEMP PO) Place 750 mg under the tongue.   potassium chloride (KLOR-CON M10) 10 MEQ tablet TAKE 1 TABLET BY MOUTH EVERY DAY   [DISCONTINUED] losartan-hydrochlorothiazide (HYZAAR) 100-25 MG tablet TAKE 1 TABLET BY MOUTH  EVERY DAY   No facility-administered medications prior to visit.    Review of Systems  Last CBC Lab Results  Component Value Date   WBC 7.1 01/17/2023   HGB 15.4 01/17/2023   HCT 44.1 01/17/2023   MCV 91 01/17/2023   MCH 31.8 01/17/2023   RDW 12.7 01/17/2023   PLT 232 01/17/2023   Last metabolic panel Lab Results  Component Value Date   GLUCOSE 152 (H) 01/17/2023   NA 139 01/17/2023   K 3.9 01/17/2023   CL 97 01/17/2023   CO2 21 01/17/2023   BUN 15 01/17/2023   CREATININE 0.70 01/17/2023   EGFR 93 01/17/2023   CALCIUM 10.3 01/17/2023   PROT 7.8 07/14/2022   ALBUMIN 4.4 07/14/2022   LABGLOB 3.4 07/14/2022   AGRATIO 1.3 07/14/2022   BILITOT 0.4 07/14/2022   ALKPHOS 81 07/14/2022   AST 27 07/14/2022   ALT 29 07/14/2022   ANIONGAP 13 01/16/2018   Last lipids Lab Results  Component Value Date   CHOL 197 05/11/2023   HDL 30 (L) 05/11/2023   LDLCALC 102 (H) 05/11/2023   TRIG 382 (H) 05/11/2023   CHOLHDL 6.6 (H) 05/11/2023   Last hemoglobin A1c Lab Results  Component Value Date   HGBA1C 6.9 (H) 01/17/2023     Objective    BP (!) 165/77   Pulse 73   Ht 5\' 2"  (1.575 m)   Wt 165 lb 1.6 oz (74.9 kg)   SpO2 100%  BMI 30.20 kg/m   BP Readings from Last 3 Encounters:  08/09/23 (!) 165/77  05/08/23 (!) 140/65  01/17/23 (!) 160/84   Wt Readings from Last 3 Encounters:  08/09/23 165 lb 1.6 oz (74.9 kg)  05/30/23 176 lb (79.8 kg)  01/17/23 172 lb 3.2 oz (78.1 kg)   Physical Exam Vitals and nursing note reviewed.  Constitutional:      General: She is not in acute distress.    Appearance: Normal appearance. She is obese. She is not ill-appearing, toxic-appearing or diaphoretic.  HENT:     Head: Normocephalic and atraumatic.   Cardiovascular:     Rate and Rhythm: Normal rate and regular rhythm.     Pulses: Normal pulses.     Heart sounds: Normal heart sounds. No murmur heard.    No friction rub. No gallop.  Pulmonary:     Effort: Pulmonary effort is  normal. No respiratory distress.     Breath sounds: Normal breath sounds. No stridor. No wheezing, rhonchi or rales.  Chest:     Chest wall: No tenderness.       Comments: Reports R breast cyst has been open and draining x 3 months Musculoskeletal:        General: No swelling, tenderness, deformity or signs of injury. Normal range of motion.     Right lower leg: No edema.     Left lower leg: No edema.  Skin:    General: Skin is warm and dry.     Capillary Refill: Capillary refill takes less than 2 seconds.     Coloration: Skin is not jaundiced or pale.     Findings: Erythema and lesion present. No bruising or rash.  Neurological:     General: No focal deficit present.     Mental Status: She is alert and oriented to person, place, and time. Mental status is at baseline.     Cranial Nerves: No cranial nerve deficit.     Sensory: No sensory deficit.     Motor: No weakness.     Coordination: Coordination normal.  Psychiatric:        Mood and Affect: Mood is depressed. Affect is flat.        Behavior: Behavior normal.        Thought Content: Thought content normal.        Judgment: Judgment normal.     No results found for any visits on 08/09/23.  Assessment & Plan     Problem List Items Addressed This Visit       Cardiovascular and Mediastinum   Hypertension associated with diabetes (HCC)    Chronic, remains elevated Recommend change in ARB from losartan to diovan Goal of 120/80 or less Repeat CBC and CMP      Relevant Medications   valsartan-hydrochlorothiazide (DIOVAN-HCT) 320-12.5 MG tablet   Other Relevant Orders   CBC with Differential/Platelet   Comprehensive Metabolic Panel (CMET)   Lipid panel   TSH     Endocrine   Hyperlipidemia associated with type 2 diabetes mellitus (HCC)    Chronic, previously elevated Repeat LP LDL goal <70 recommend diet low in saturated fat and regular exercise - 30 min at least 5 times per week       Relevant Medications    valsartan-hydrochlorothiazide (DIOVAN-HCT) 320-12.5 MG tablet   Type 2 diabetes mellitus with other diabetic kidney complication (HCC) - Primary    Chronic, previously stable Repeat A1c Continue to recommend balanced, lower carb meals. Smaller meal size, adding  snacks. Choosing water as drink of choice and increasing purposeful exercise.       Relevant Medications   valsartan-hydrochlorothiazide (DIOVAN-HCT) 320-12.5 MG tablet   Other Relevant Orders   CBC with Differential/Platelet   Comprehensive Metabolic Panel (CMET)   Lipid panel   TSH   Hemoglobin A1c     Musculoskeletal and Integument   Abnormal skin growth    L forehead; reports <1 week      Relevant Orders   Ambulatory referral to Dermatology   Sebaceous cyst of skin of breast    Bilateral R>L      Relevant Orders   Ambulatory referral to Dermatology     Other   Alcohol dependence, daily use (HCC)    Chronic, stable Continue to monitor; pt denies concern for cessation efforts at this time       Compulsive tobacco user syndrome    Chronic, stable Continue to monitor; pt denies concern for cessation efforts at this time       Stress and adjustment reaction    Reports death of her sister 1 month ago; only sibling Continues to stay active with grandchildren; declines use of medication to assist Continue to monitor.      Return in about 3 months (around 11/09/2023) for chonic disease management.     Leilani Merl, FNP, have reviewed all documentation for this visit. The documentation on 08/09/23 for the exam, diagnosis, procedures, and orders are all accurate and complete.  Jacky Kindle, FNP  [facility] [phone] (phone) [phone] (fax)  [facility]

## 2023-08-09 NOTE — Assessment & Plan Note (Signed)
Chronic, remains elevated Recommend change in ARB from losartan to diovan Goal of 120/80 or less Repeat CBC and CMP

## 2023-08-09 NOTE — Assessment & Plan Note (Signed)
Bilateral R>L

## 2023-08-09 NOTE — Assessment & Plan Note (Signed)
Reports death of her sister 1 month ago; only sibling Continues to stay active with grandchildren; declines use of medication to assist Continue to monitor.

## 2023-08-09 NOTE — Assessment & Plan Note (Signed)
L forehead; reports <1 week

## 2023-08-09 NOTE — Assessment & Plan Note (Signed)
Chronic, previously stable Repeat A1c Continue to recommend balanced, lower carb meals. Smaller meal size, adding snacks. Choosing water as drink of choice and increasing purposeful exercise.

## 2023-08-10 ENCOUNTER — Other Ambulatory Visit: Payer: Self-pay | Admitting: Family Medicine

## 2023-08-10 LAB — LIPID PANEL
Chol/HDL Ratio: 6.5 ratio — ABNORMAL HIGH (ref 0.0–4.4)
Cholesterol, Total: 163 mg/dL (ref 100–199)
HDL: 25 mg/dL — ABNORMAL LOW (ref >39)
LDL Chol Calc (NIH): 78 mg/dL (ref 0–99)
Triglycerides: 371 mg/dL — ABNORMAL HIGH (ref 0–149)
VLDL Cholesterol Cal: 60 mg/dL — ABNORMAL HIGH (ref 5–40)

## 2023-08-10 LAB — CBC WITH DIFFERENTIAL/PLATELET
Basophils Absolute: 0.1 10*3/uL (ref 0.0–0.2)
Basos: 1 %
EOS (ABSOLUTE): 0.4 10*3/uL (ref 0.0–0.4)
Eos: 7 %
Hematocrit: 41.3 % (ref 34.0–46.6)
Hemoglobin: 14.4 g/dL (ref 11.1–15.9)
Immature Grans (Abs): 0 10*3/uL (ref 0.0–0.1)
Immature Granulocytes: 0 %
Lymphocytes Absolute: 1.7 10*3/uL (ref 0.7–3.1)
Lymphs: 27 %
MCH: 31.7 pg (ref 26.6–33.0)
MCHC: 34.9 g/dL (ref 31.5–35.7)
MCV: 91 fL (ref 79–97)
Monocytes Absolute: 0.6 10*3/uL (ref 0.1–0.9)
Monocytes: 9 %
Neutrophils Absolute: 3.7 10*3/uL (ref 1.4–7.0)
Neutrophils: 56 %
Platelets: 245 10*3/uL (ref 150–450)
RBC: 4.54 x10E6/uL (ref 3.77–5.28)
RDW: 12.1 % (ref 11.7–15.4)
WBC: 6.5 10*3/uL (ref 3.4–10.8)

## 2023-08-10 LAB — COMPREHENSIVE METABOLIC PANEL
ALT: 27 IU/L (ref 0–32)
AST: 27 IU/L (ref 0–40)
Albumin: 4.1 g/dL (ref 3.8–4.8)
Alkaline Phosphatase: 90 IU/L (ref 44–121)
BUN/Creatinine Ratio: 11 — ABNORMAL LOW (ref 12–28)
BUN: 8 mg/dL (ref 8–27)
Bilirubin Total: 0.4 mg/dL (ref 0.0–1.2)
CO2: 25 mmol/L (ref 20–29)
Calcium: 9.7 mg/dL (ref 8.7–10.3)
Chloride: 86 mmol/L — ABNORMAL LOW (ref 96–106)
Creatinine, Ser: 0.71 mg/dL (ref 0.57–1.00)
Globulin, Total: 3 g/dL (ref 1.5–4.5)
Glucose: 435 mg/dL — ABNORMAL HIGH (ref 70–99)
Potassium: 4.1 mmol/L (ref 3.5–5.2)
Sodium: 128 mmol/L — ABNORMAL LOW (ref 134–144)
Total Protein: 7.1 g/dL (ref 6.0–8.5)
eGFR: 91 mL/min/{1.73_m2} (ref >59)

## 2023-08-10 LAB — HEMOGLOBIN A1C
Est. average glucose Bld gHb Est-mCnc: 303 mg/dL
Hgb A1c MFr Bld: 12.2 % — ABNORMAL HIGH (ref 4.8–5.6)

## 2023-08-10 LAB — TSH: TSH: 1.66 u[IU]/mL (ref 0.450–4.500)

## 2023-08-10 MED ORDER — BLOOD GLUCOSE TEST VI STRP: 1.0000 | ORAL_STRIP | Freq: Three times a day (TID) | 0 refills | Status: DC

## 2023-08-10 MED ORDER — FENOFIBRATE 145 MG PO TABS: 145.0000 mg | ORAL_TABLET | Freq: Every day | ORAL | 3 refills | Status: DC

## 2023-08-10 MED ORDER — LANCETS MISC. MISC: 1.0000 | Freq: Three times a day (TID) | 0 refills | Status: AC

## 2023-08-10 MED ORDER — INSULIN GLARGINE 100 UNIT/ML SOLOSTAR PEN: 10.0000 [IU] | PEN_INJECTOR | Freq: Every day | SUBCUTANEOUS | 0 refills | Status: DC

## 2023-08-10 MED ORDER — BLOOD GLUCOSE MONITORING SUPPL DEVI
1.0000 | Freq: Three times a day (TID) | 0 refills | Status: DC
Start: 2023-08-10 — End: 2024-04-29

## 2023-08-10 MED ORDER — "PEN NEEDLES 5/16"" 31G X 8 MM MISC": 1.0000 | Freq: Every day | 11 refills | Status: DC

## 2023-08-10 MED ORDER — LANCET DEVICE MISC: 1.0000 | Freq: Three times a day (TID) | 0 refills | Status: AC

## 2023-08-10 MED ORDER — METFORMIN HCL ER 750 MG PO TB24: 750.0000 mg | ORAL_TABLET | Freq: Two times a day (BID) | ORAL | 3 refills | Status: DC

## 2023-08-10 NOTE — Progress Notes (Signed)
Labs indicated acute worsening of diabetes; diabetes was previously well controlled and now is very uncontrolled. Goal A1c is <7%; current A1c is 12.2% and previously up from 6.9%. Recommend use of insulin to assist in quick reduction of blood sugar to prevent lifelong complications. Recommend start of oral medications as well. Recommend blood sugar checks twice daily. Cholesterol is close to goal with LDL at 78. The 10-year ASCVD risk score (Arnett DK, et al., 2019) is: 56.8%

## 2023-08-11 ENCOUNTER — Encounter: Payer: Self-pay | Admitting: Family Medicine

## 2023-08-12 ENCOUNTER — Encounter: Payer: Self-pay | Admitting: Family Medicine

## 2023-08-16 ENCOUNTER — Other Ambulatory Visit: Payer: PPO | Admitting: Pharmacist

## 2023-08-16 ENCOUNTER — Encounter: Payer: Self-pay | Admitting: Pharmacist

## 2023-08-16 NOTE — Progress Notes (Signed)
08/16/2023 Name: Heather Burke MRN: [id number] DOB: [birth year]  Chief Complaint  Patient presents with   Medication Management   Medication Adherence    Heather Burke is a 72 y.o. year old female who presented for a telephone visit.   They were referred to the pharmacist by their PCP for assistance in managing diabetes, hypertension, and hyperlipidemia.    Subjective:  Care Team: Primary Care Provider: Jacky Kindle, FNP ; Next Scheduled Visit: 11/09/2023   Medication Access/Adherence  Current Pharmacy:  [id number] Nicholes Rough, [facility] - [address] [place] [address] Phone: [phone] Fax: [phone]   Patient reports affordability concerns with their medications: No  Patient reports access/transportation concerns to their pharmacy: No  Patient reports adherence concerns with their medications:  No    Uses weekly pillbox  Reports that her sister passed away on 07-23-2023 and that grief impacted her ability to eat for weeks, feeling chronically nauseated, and often eating more carbohydrates when she was able to eat - Reports has good support from her daughters with managing her grief   Diabetes:  Current medications:  - Trulicity 1.5 mg weekly on Saturdays - metformin ER 750 mg - 1 tablet daily with supper  Reports retrialing metformin ER, Currently taking once daily and tolerating better - Reports has not yet started Lantus as preferred to not start all at once  Denies having yet started monitoring home blood sugar as just picked up glucometer and supplies today   Patient denies hypoglycemic s/sx including dizziness, shakiness, sweating.   Current meal patterns:  - Breakfast: protein shake - Lunch: [employer] soup - Supper: pork roast and butter beans and broccoli - Snacks: graham crackers - Drinks: coffee in morning, 2-3 bottles of water, diet coke, 2-3 x 12 oz can of beers in evening  Current physical activity: walks for  20-30 minutes at mall x 4 days/week and active with grandkids  Current medication access support: Enrolled in [employer] patient assistance program for Trulicity   Hypertension:  Current medications:  - Diltiazem XR 240 mg daily - metoprolol ER 200 mg nightly - losartan-HCTZ 100-25 mg daily Reports has ~2 week supply of losartan-HCTZ remaining and then plans to switch to valsartan 320-12.5 mg daily as recommended by PCP on 8/21  Patient has a validated, automated, upper arm home BP cuff Current blood pressure readings readings: last checked on 8/25, reading: 157/77, HR 73  Admits to adding salt to her food  Current physical activity: walks for 20-30 minutes at mall x 4 days/week and active with grandkids   Hyperlipidemia/ASCVD Risk Reduction  Current lipid lowering medications: fenofibrate 145 mg daily & Vascepa 2 grams twice daily Medications tried in the past: rosuvastatin, lovastatin  Antiplatelet regimen: aspirin 81 mg daily   Current physical activity: walks for 20-30 minutes at mall x 4 days/week and active with grandkids    Objective:  Lab Results  Component Value Date   HGBA1C 12.2 (H) 08/09/2023    Lab Results  Component Value Date   CREATININE 0.71 08/09/2023   BUN 8 08/09/2023   NA 128 (L) 08/09/2023   K 4.1 08/09/2023   CL 86 (L) 08/09/2023   CO2 25 08/09/2023    Lab Results  Component Value Date   CHOL 163 08/09/2023   HDL 25 (L) 08/09/2023   LDLCALC 78 08/09/2023   TRIG 371 (H) 08/09/2023   CHOLHDL 6.5 (H) 08/09/2023   BP Readings from Last 3 Encounters:  08/09/23 Marland Kitchen)  165/77  05/08/23 (!) 140/65  01/17/23 (!) 160/84   Pulse Readings from Last 3 Encounters:  08/09/23 73  05/08/23 74  01/17/23 69     Medications Reviewed Today     Reviewed by Manuela Neptune, RPH-CPP (Pharmacist) on 08/16/23 at 1605  Med List Status: <None>   Medication Order Taking? Sig Documenting Provider Last Dose Status Informant  aspirin 81 MG tablet  [id number] Yes Take 81 mg by mouth daily. [provider] Taking Active   Blood Glucose Monitoring Suppl DEVI [id number]  1 each by Does not apply route in the morning, at noon, and at bedtime. May substitute to any manufacturer covered by patient's insurance. [name] T, FNP  Active   diltiazem (DILT-XR) 240 MG 24 hr capsule [id number] Yes TAKE 1 CAPSULE BY MOUTH EVERY DAY Jacky Kindle, FNP Taking Active   Dulaglutide (TRULICITY) 1.5 MG/0.5ML SOPN [id number] Yes Inject 1.5 mg into the skin once a week. Via [employer] patient assistance through Dec 2023 Beryle Flock, Marzella Schlein, MD Taking Active   fenofibrate (TRICOR) 145 MG tablet [id number] Yes Take 1 tablet (145 mg total) by mouth daily. Jacky Kindle, FNP Taking Active   fluticasone [facility]) 50 MCG/ACT nasal spray [id number] Yes SPRAY 2 SPRAYS INTO EACH NOSTRIL EVERY DAY Jacky Kindle, FNP Taking Active   Glucose Blood (BLOOD GLUCOSE TEST STRIPS) STRP [id number]  1 each by In Vitro route in the morning, at noon, and at bedtime. May substitute to any manufacturer covered by patient's insurance. Jacky Kindle, FNP  Active   icosapent Ethyl (VASCEPA) 1 g capsule [id number] Yes Take 2 g by mouth 2 (two) times daily. [provider] Taking Active   insulin glargine (LANTUS) 100 UNIT/ML Solostar Pen [id number] No Inject 10 Units into the skin at bedtime. Titrate by 2 units every 3 days for fasting morning blood sugar >150.  Patient not taking: Reported on 08/16/2023   Jacky Kindle, FNP Not Taking Active   Insulin Pen Needle (PEN NEEDLES 31GX5/16") 31G X 8 MM MISC [id number]  1 each by Does not apply route daily. Jacky Kindle, FNP  Active   Lancet Device MISC [id number]  1 each by Does not apply route in the morning, at noon, and at bedtime. May substitute to any manufacturer covered by patient's insurance. Jacky Kindle, FNP  Active   Lancets Misc. MISC [id number]  1 each by Does not apply route in the morning, at noon, and at bedtime.  May substitute to any manufacturer covered by patient's insurance. Jacky Kindle, FNP  Active   latanoprost (XALATAN) 0.005 % ophthalmic solution [id number] Yes 1 drop at bedtime. [provider] Taking Active   levocetirizine (XYZAL) 5 MG tablet [id number]  Take 5 mg by mouth as needed. [provider]  Active   levothyroxine (SYNTHROID) 88 MCG tablet [id number] Yes Take 1 tablet (88 mcg total) by mouth daily. Jacky Kindle, FNP Taking Active   metFORMIN (GLUCOPHAGE-XR) 750 MG 24 hr tablet [id number] Yes Take 1 tablet (750 mg total) by mouth 2 (two) times daily before lunch and supper. Jacky Kindle, FNP Taking Active            Med Note Ronney Asters, [facility]   Wed Aug 16, 2023  3:35 PM) Taking 1 tablet daily with supper  metoprolol (TOPROL-XL) 200 MG 24 hr tablet [id number] Yes TAKE 1 TABLET BY MOUTH EVERY DAY Jacky Kindle, FNP Taking Active   Misc Natural Products (T-RELIEF  CBD+13 SL) [id number]  Place 500 mg under the tongue daily. [provider]  Active Self  Nutritional Supplements (QUINOA/KALE/HEMP PO) [id number] Yes Place 750 mg under the tongue. [provider] Taking Active   potassium chloride (KLOR-CON M10) 10 MEQ tablet [id number] Yes TAKE 1 TABLET BY MOUTH EVERY DAY Jacky Kindle, FNP Taking Active   valsartan-hydrochlorothiazide (DIOVAN-HCT) 320-12.5 MG tablet [id number]  Take 1 tablet by mouth daily. Jacky Kindle, FNP  Active               Assessment/Plan:   Diabetes: - Currently uncontrolled - Reviewed long term cardiovascular and renal outcomes of uncontrolled blood sugar - Reviewed goal A1c, goal fasting, and goal 2 hour post prandial glucose - Reviewed dietary modifications including importance of having regular well-balanced meals and snacks throughout the day, while controlling carbohydrate portion sizes  Discuss lower carbohydrate snack ideas  Discuss impact of beer consumption on blood sugar and encourage patient to limit  consumption Encourage patient to review nutrition labels for total carbohydrate content of foods - Encourage patient to take metformin ER with food and to take consistently to aid with tolerability. Counsel patient that if she is tolerating once daily dosing well after 1-2 weeks to increase to taking twice daily as prescribed - Recommend to check glucose twice daily (morning fasting & 2-hours after a meal), keep log of results and have this record to review at upcoming medical appointments. Patient to contact provider office sooner if needed for readings outside of established parameters or symptoms Encourage patient to use blood sugar checks as feedback on dietary choices, habit and impact of her medications  Hypertension: - Currently uncontrolled - Reviewed long term cardiovascular and renal outcomes of uncontrolled blood pressure - Counsel patient on impact of salt/sodium on blood pressure and encourage patient to review nutrition labels for carbohydrate content of foods - Recommend to continue to monitor home blood pressure, keep log of results and have this record to review at upcoming medical appointments. Patient to contact provider office sooner if needed for readings outside of established parameters or symptoms   Hyperlipidemia/ASCVD Risk Reduction: - Currently uncontrolled.  - Reviewed long term complications of uncontrolled cholesterol - Recommend to take fenofibrate and Vascepa as directed. Plan to reassess following next lipid panel lab work   Follow Up Plan: Clinical Pharmacist will follow up with patient by telephone on 09/25/2023 at 10 am  Estelle Grumbles, PharmD, [facility] [phone]

## 2023-08-16 NOTE — Patient Instructions (Signed)
Goals Addressed             This Visit's Progress    Pharmacy Goals       The goal A1c is less than 7%. This is the best way to reduce the risk of the long term complications of diabetes, including heart disease, kidney disease, eye disease, strokes, and nerve damage. An A1c of less than 7% corresponds with fasting sugars less than 130 and 2 hour after meal sugars less than 180. Please check your blood sugar twice daily.   Check your blood pressure once daily, and any time you have concerning symptoms like headache, chest pain, dizziness, shortness of breath, or vision changes.   Our goal is less than 130/80.  To appropriately check your blood pressure, make sure you do the following:  1) Avoid caffeine, exercise, or tobacco products for 30 minutes before checking. Empty your bladder. 2) Sit with your back supported in a flat-backed chair. Rest your arm on something flat (arm of the chair, table, etc). 3) Sit still with your feet flat on the floor, resting, for at least 5 minutes.  4) Check your blood pressure. Take 1-2 readings.  5) Write down these readings and bring with you to any provider appointments.  Bring your home blood pressure machine with you to a provider's office for accuracy comparison at least once a year.   Make sure you take your blood pressure medications before you come to any office visit, even if you were asked to fast for labs.  Estelle Grumbles, PharmD, [facility] [phone]

## 2023-09-07 ENCOUNTER — Other Ambulatory Visit: Payer: Self-pay | Admitting: Family Medicine

## 2023-09-08 NOTE — Telephone Encounter (Signed)
Requested Prescriptions  Pending Prescriptions Disp Refills   glucose blood (ACCU-CHEK GUIDE) test strip [Pharmacy Med Name: ACCU-CHEK GUIDE TEST STRIP] 100 strip 2    Sig: USE 1 STIP TO CHECK BLOOD SUGAR IN THE MORNING, AT NOON, AND AT BEDTIME     Endocrinology: Diabetes - Testing Supplies Passed - 09/07/2023  1:24 AM      Passed - Valid encounter within last 12 months    Recent Outpatient Visits           1 month ago Type 2 diabetes mellitus with other diabetic kidney complication [facility])   [place] [facility] [name] T, FNP   7 months ago Hypertension associated with diabetes [facility])   [facility] [name] T, FNP   1 year ago Type 2 diabetes mellitus with other diabetic kidney complication [facility])   [facility] Jacky Kindle, FNP   1 year ago Chronic cough   [place] [facility] [place], [place], PA-C   1 year ago COPD exacerbation [facility])   [place] [facility] Jacky Kindle, FNP       Future Appointments             In 2 months Jacky Kindle, FNP [facility]

## 2023-09-12 DIAGNOSIS — H40153 Residual stage of open-angle glaucoma, bilateral: Secondary | ICD-10-CM | POA: Diagnosis not present

## 2023-09-25 ENCOUNTER — Encounter: Payer: Self-pay | Admitting: Pharmacist

## 2023-09-25 ENCOUNTER — Other Ambulatory Visit: Payer: PPO | Admitting: Pharmacist

## 2023-09-25 NOTE — Patient Instructions (Signed)
Goals Addressed             This Visit's Progress    Pharmacy Goals       The goal A1c is less than 7%. This is the best way to reduce the risk of the long term complications of diabetes, including heart disease, kidney disease, eye disease, strokes, and nerve damage. An A1c of less than 7% corresponds with fasting sugars less than 130 and 2 hour after meal sugars less than 180. Please check your blood sugar twice daily.   Check your blood pressure once daily, and any time you have concerning symptoms like headache, chest pain, dizziness, shortness of breath, or vision changes.   Our goal is less than 130/80.  To appropriately check your blood pressure, make sure you do the following:  1) Avoid caffeine, exercise, or tobacco products for 30 minutes before checking. Empty your bladder. 2) Sit with your back supported in a flat-backed chair. Rest your arm on something flat (arm of the chair, table, etc). 3) Sit still with your feet flat on the floor, resting, for at least 5 minutes.  4) Check your blood pressure. Take 1-2 readings.  5) Write down these readings and bring with you to any provider appointments.  Bring your home blood pressure machine with you to a provider's office for accuracy comparison at least once a year.   Make sure you take your blood pressure medications before you come to any office visit, even if you were asked to fast for labs.  Please consider following up with the [place] [employer] for their help with quitting smoking.  The [place] [employer] phone number is: [phone]  Estelle Grumbles, PharmD, [facility] [phone]

## 2023-09-25 NOTE — Progress Notes (Signed)
09/25/2023 Name: Heather Burke MRN: [id number] DOB: 1951-03-09  Chief Complaint  Patient presents with   Medication Management   Medication Assistance    Heather Burke is a 72 y.o. year old female who presented for a telephone visit.   They were referred to the pharmacist by their PCP for assistance in managing diabetes, hypertension, and hyperlipidemia.      Subjective:   Care Team: Primary Care Provider: Jacky Kindle, FNP ; Next Scheduled Visit: 11/09/2023    Medication Access/Adherence  Current Pharmacy:  [id number] Nicholes Rough, [facility] - [address] [place] [address] Phone: [phone] Fax: [phone]   Patient reports affordability concerns with their medications: No  Patient reports access/transportation concerns to their pharmacy: No  Patient reports adherence concerns with their medications:  No     Uses weekly pillbox   Barriers: notes dietary choices limited by denture  Diabetes:   Current medications:  - Trulicity 1.5 mg weekly on Saturdays - metformin ER 750 mg - 1 tablet daily with lunch and 1 tablet daily with supper  Reports having gas and diarrhea since started taking twice daily; may reduce back to once daily if unable to tolerate twice daily dosing - Reports has not yet started Lantus, but agreeable to start as has now been monitoring home blood sugar and aware of elevated readings.  Plans to start Lantus 10 units daily as reccommended by PCP   Current home blood sugar readings: morning before breakfast ranging: 254-316 evening: 279-437     Patient denies hypoglycemic s/sx including dizziness, shakiness, sweating.    Current meal patterns:  - Breakfast: protein shake - Lunch: sandwich - Supper: pork roast and potatoes or beans and squash - Snacks: Optavia brownie  - Drinks: coffee in morning, 2-3 bottles of water, diet coke, 2 x 12 oz can of beers in evening  Reports has lost 25 lbs since loss of  her sister in July related to grief. Reports current home weight ~151 lbs  Current physical activity: walks for 20-30 minutes at mall x 4 days/week and active with grandkids   Current medication access support: Enrolled in [employer] patient assistance program for Trulicity     Hypertension:   Current medications:  - Diltiazem XR 240 mg daily - metoprolol ER 200 mg nightly - valsartan-HCTZ 320-12.5 mg daily (reports switched from losartan-HCTZ starting ~1 week ago)   Patient has a validated, automated, upper arm home BP cuff Current blood pressure readings readings:  - Last checked today, reading: 151/83, HR 77 - Recalls last week: 145/81, HR 78  *Denies taking her medications prior to checking above readings and reports smoking prior to checking   Admits to adding salt to her food   Current physical activity: walks for 30 minutes at mall x 4 days/week and active with grandkids     Hyperlipidemia/ASCVD Risk Reduction   Current lipid lowering medications: fenofibrate 145 mg daily & Vascepa 2 grams twice daily Medications tried in the past: rosuvastatin, lovastatin   Antiplatelet regimen: aspirin 81 mg daily     Current physical activity: walks for 20-30 minutes at mall x 4 days/week and active with grandkids    Tobacco Use: - Currently smokes ~2 packs/day - Reports wants to quit smoking, but not ready to quit now with current stress (loss of sister and cousin)   Objective:  Lab Results  Component Value Date   HGBA1C 12.2 (H) 08/09/2023    Lab Results  Component  Value Date   CREATININE 0.71 08/09/2023   BUN 8 08/09/2023   NA 128 (L) 08/09/2023   K 4.1 08/09/2023   CL 86 (L) 08/09/2023   CO2 25 08/09/2023    Lab Results  Component Value Date   CHOL 163 08/09/2023   HDL 25 (L) 08/09/2023   LDLCALC 78 08/09/2023   TRIG 371 (H) 08/09/2023   CHOLHDL 6.5 (H) 08/09/2023   BP Readings from Last 3 Encounters:  08/09/23 (!) 165/77  05/08/23 (!) 140/65  01/17/23  (!) 160/84   Pulse Readings from Last 3 Encounters:  08/09/23 73  05/08/23 74  01/17/23 69    Medications Reviewed Today     Reviewed by Manuela Neptune, RPH-CPP (Pharmacist) on 09/25/23 at 1047  Med List Status: <None>   Medication Order Taking? Sig Documenting Provider Last Dose Status Informant  aspirin 81 MG tablet [id number]  Take 81 mg by mouth daily. [provider]  Active   Blood Glucose Monitoring Suppl DEVI [id number]  1 each by Does not apply route in the morning, at noon, and at bedtime. May substitute to any manufacturer covered by patient's insurance. Jacky Kindle, FNP  Active   Cholecalciferol 125 MCG (5000 UT) TABS [id number]  Take 1 tablet by mouth daily. [provider]  Active   diltiazem (DILT-XR) 240 MG 24 hr capsule [id number]  TAKE 1 CAPSULE BY MOUTH EVERY DAY Jacky Kindle, FNP  Active   Dulaglutide (TRULICITY) 1.5 MG/0.5ML SOPN [id number] Yes Inject 1.5 mg into the skin once a week. Via [employer] patient assistance through Dec 2023 Beryle Flock, Marzella Schlein, MD Taking Active   fenofibrate (TRICOR) 145 MG tablet [id number]  Take 1 tablet (145 mg total) by mouth daily. Jacky Kindle, FNP  Active   fluticasone [facility]) 50 MCG/ACT nasal spray [id number]  SPRAY 2 SPRAYS INTO EACH NOSTRIL EVERY DAY Jacky Kindle, FNP  Active   glucose blood (ACCU-CHEK GUIDE) test strip [id number]  USE 1 STIP TO CHECK BLOOD SUGAR IN THE MORNING, AT NOON, AND AT BEDTIME Jacky Kindle, FNP  Active   icosapent Ethyl (VASCEPA) 1 g capsule [id number]  Take 2 g by mouth 2 (two) times daily. [provider]  Active   insulin glargine (LANTUS) 100 UNIT/ML Solostar Pen [id number]  Inject 10 Units into the skin at bedtime. Titrate by 2 units every 3 days for fasting morning blood sugar >150.  Patient not taking: Reported on 08/16/2023   Jacky Kindle, FNP  Active   Insulin Pen Needle (PEN NEEDLES 31GX5/16") 31G X 8 MM MISC [id number]  1 each by Does not apply route daily.  Jacky Kindle, FNP  Active   latanoprost (XALATAN) 0.005 % ophthalmic solution [id number]  1 drop at bedtime. [provider]  Active   levocetirizine (XYZAL) 5 MG tablet [id number]  Take 5 mg by mouth as needed. [provider]  Active   levothyroxine (SYNTHROID) 88 MCG tablet [id number]  Take 1 tablet (88 mcg total) by mouth daily. Jacky Kindle, FNP  Active   metFORMIN (GLUCOPHAGE-XR) 750 MG 24 hr tablet [id number] Yes Take 1 tablet (750 mg total) by mouth 2 (two) times daily before lunch and supper. Jacky Kindle, FNP Taking Active            Med Note Ronney Asters, [facility]   Mon Sep 25, 2023 10:47 AM)    metoprolol (TOPROL-XL) 200 MG 24 hr tablet [id number] Yes TAKE 1 TABLET BY MOUTH EVERY  Luisa Dago, FNP Taking Active   Misc Natural Products (T-RELIEF CBD+13 SL) [id number]  Place 500 mg under the tongue daily. [provider]  Active Self  Nutritional Supplements (QUINOA/KALE/HEMP PO) [id number]  Place 750 mg under the tongue. [provider]  Active   potassium chloride (KLOR-CON M10) 10 MEQ tablet [id number]  TAKE 1 TABLET BY MOUTH EVERY DAY Jacky Kindle, FNP  Active   valsartan-hydrochlorothiazide (DIOVAN-HCT) 320-12.5 MG tablet [id number] Yes Take 1 tablet by mouth daily. Jacky Kindle, FNP Taking Active               Assessment/Plan:   Diabetes: - Currently uncontrolled - Reviewed long term cardiovascular and renal outcomes of uncontrolled blood sugar - Reviewed goal A1c, goal fasting, and goal 2 hour post prandial glucose - Reviewed dietary modifications including importance of having regular well-balanced meals and snacks throughout the day, while controlling carbohydrate portion sizes             Discuss lower carbohydrate snack ideas Discuss impact of beer consumption on blood sugar and encourage patient to limit consumption            Encourage patient to review nutrition labels for total carbohydrate content of foods - Encourage  medication adherence. Patient reports plans to start Lantus 10 units daily as recommended by PCP Provide patient with counseling on Lantus administration technique and send patient link to manufacturer "how to use" video for Lantus Solostar via MyChart Discuss and patient verbalizes understanding of instruction from PCP for how to titrate Lantus dose "by 2 units every 3 days for fasting morning blood sugar >150" if needed Counsel patient on s/s of low blood sugar and how to treat lows (and send handout via MyChart) Review rules of 15s - importance of using 15 grams of sugar to treat low, recheck blood sugar in 15 minutes, treat again if remains low or, if back to normal, having meal if mealtime or snack  Review examples of sources of 15 grams of sugar Encourage patient to pick up glucose tablets to carry with her to use if needed - Recommend to check glucose twice daily (morning fasting & 2-hours after a meal), keep log of results and have this record to review at upcoming medical appointments. Patient to contact provider office sooner if needed for readings outside of established parameters or symptoms Encourage patient to use blood sugar checks as feedback on dietary choices, habit and impact of her medications - Will collaborate with PCP and CPhT to aid patient with applying for Trulicity patient assistance program re-enrollment   Hypertension: - Currently uncontrolled - Reviewed long term cardiovascular and renal outcomes of uncontrolled blood pressure - Counsel on blood pressure monitoring technique, including importance of avoiding smoking for at least 30 minutes prior to checking - Counsel patient on impact of salt/sodium on blood pressure and encourage patient to review nutrition labels for carbohydrate content of foods - Recommend to continue to monitor home blood pressure, keep log of results and have this record to review at upcoming medical appointments. Patient to contact provider office  sooner if needed for readings outside of established parameters or symptoms     Hyperlipidemia/ASCVD Risk Reduction: - Currently uncontrolled.  - Have reviewed long term complications of uncontrolled cholesterol - Recommend to take fenofibrate and Vascepa as directed. Plan to reassess following next lipid panel lab work - Reports generic Vascepa affordable for now, but will let clinical pharmacist know if needing help applying for  grant for patient assistance in future  Tobacco Abuse - Provided motivational interviewing to assess tobacco use - Provided information on 1 800 QUIT NOW support program      Follow Up Plan: Clinical Pharmacist will follow up with patient by telephone on 10/23/2023 at 12:00 PM    Estelle Grumbles, PharmD, [facility] [phone]

## 2023-10-05 ENCOUNTER — Telehealth: Payer: Self-pay | Admitting: Pharmacy Technician

## 2023-10-05 ENCOUNTER — Encounter: Payer: Self-pay | Admitting: Family Medicine

## 2023-10-05 DIAGNOSIS — Z5986 Financial insecurity: Secondary | ICD-10-CM

## 2023-10-05 NOTE — Progress Notes (Addendum)
[employer] Customer service manager [facility])                                            [facility] Quality Pharmacy Team   10/25/2023-ADDENDUM Re-mailed application as outlined below as patient informs she did not receive the first mailing.  10/05/2023  Heather Burke June 08, 1951 [id number]                                      Medication Assistance Referral  Referral From: [facility] Embedded RPh Heather Burke   Medication/Company: Trudee Kuster / Julious Oka Patient application portion:  Mailed Provider application portion: Faxed  to Heather Norton, NP Provider address/fax verified via: Office website  Heather Burke, CPhT [place]  Office: [phone] Fax: [phone] Email: [email or web address]

## 2023-10-08 ENCOUNTER — Other Ambulatory Visit: Payer: Self-pay | Admitting: Family Medicine

## 2023-10-09 ENCOUNTER — Other Ambulatory Visit: Payer: Self-pay | Admitting: Family Medicine

## 2023-10-09 NOTE — Telephone Encounter (Signed)
Called patient and was given provider's message.

## 2023-10-09 NOTE — Telephone Encounter (Signed)
Requested Prescriptions  Pending Prescriptions Disp Refills   ACCU-CHEK GUIDE test strip [Pharmacy Med Name: ACCU-CHEK GUIDE TEST STRIP] 100 strip 0    Sig: USE 1 STIP TO CHECK BLOOD SUGAR IN THE MORNING, AT NOON, AND AT BEDTIME     Endocrinology: Diabetes - Testing Supplies Passed - 10/08/2023  1:55 AM      Passed - Valid encounter within last 12 months    Recent Outpatient Visits           2 months ago Type 2 diabetes mellitus with other diabetic kidney complication [facility])   [facility] [name] T, FNP   8 months ago Hypertension associated with diabetes [facility])   [facility] [name] T, FNP   1 year ago Type 2 diabetes mellitus with other diabetic kidney complication [facility])   [place] [facility] Jacky Kindle, FNP   1 year ago Chronic cough   [place] [facility] [place] [place], PA-C   1 year ago COPD exacerbation [facility])   [place] [facility] Jacky Kindle, FNP       Future Appointments             In 1 month Suzie Portela, Daryl Eastern, FNP [facility]

## 2023-10-11 ENCOUNTER — Ambulatory Visit (INDEPENDENT_AMBULATORY_CARE_PROVIDER_SITE_OTHER): Payer: PPO | Admitting: Family Medicine

## 2023-10-11 ENCOUNTER — Ambulatory Visit: Payer: Self-pay | Admitting: *Deleted

## 2023-10-11 VITALS — BP 128/70 | HR 83 | Temp 98.2°F | Resp 16 | Ht 62.0 in | Wt 147.5 lb

## 2023-10-11 DIAGNOSIS — R300 Dysuria: Secondary | ICD-10-CM

## 2023-10-11 DIAGNOSIS — Z794 Long term (current) use of insulin: Secondary | ICD-10-CM | POA: Diagnosis not present

## 2023-10-11 DIAGNOSIS — E1165 Type 2 diabetes mellitus with hyperglycemia: Secondary | ICD-10-CM

## 2023-10-11 LAB — GLUCOSE, POCT (MANUAL RESULT ENTRY): POC Glucose: 240 mg/dL — AB (ref 70–99)

## 2023-10-11 MED ORDER — SULFAMETHOXAZOLE-TRIMETHOPRIM 400-80 MG PO TABS
1.0000 | ORAL_TABLET | Freq: Two times a day (BID) | ORAL | 0 refills | Status: DC
Start: 2023-10-11 — End: 2023-10-23

## 2023-10-11 NOTE — Telephone Encounter (Signed)
Message from Ainsworth F sent at 10/11/2023 10:28 AM EDT  Pt is calling in because she believes she has a UTI. Pt says she is pretty sure that's what it is and has tried Azo and drinking cranberry juice but it hasn't helped. Pt would like an antibiotic sent in get rid of the UTI.    Call History  Contact Date/Time Type Contact Phone/Fax User  10/11/2023 10:27 AM EDT Phone (Incoming) Heather Burke, Heather Burke (Self) [phone] Judie Petit) Colon Flattery M   Reason for Disposition  Age > 50 years  Answer Assessment - Initial Assessment Questions 1. SEVERITY: "How bad is the pain?"  (e.g., Scale 1-10; mild, moderate, or severe)   - MILD (1-3): complains slightly about urination hurting   - MODERATE (4-7): interferes with normal activities     - SEVERE (8-10): excruciating, unwilling or unable to urinate because of the pain      I'm still burning.   I'm sure I have a UTI.    Frequency, burning.   Noblood in urine   No flank pain.  Urgency.     The metformin may be contributing to the fact I don't feel good.    I was having diarrhea so bad from it.   She told me to stop the metformin. 2. FREQUENCY: "How many times have you had painful urination today?"      Every time  3. PATTERN: "Is pain present every time you urinate or just sometimes?"      Yes 4. ONSET: "When did the painful urination start?"      Started burning on Sunday 5. FEVER: "Do you have a fever?" If Yes, ask: "What is your temperature, how was it measured, and when did it start?"     I'm not sure I'm taking AZO it is helping 6. PAST UTI: "Have you had a urine infection before?" If Yes, ask: "When was the last time?" and "What happened that time?"      Years ago 7. CAUSE: "What do you think is causing the painful urination?"  (e.g., UTI, scratch, Herpes sore)     UTI 8. OTHER SYMPTOMS: "Do you have any other symptoms?" (e.g., blood in urine, flank pain, genital sores, urgency, vaginal discharge)     Burning, frequency, urgency 9.  PREGNANCY: "Is there any chance you are pregnant?" "When was your last menstrual period?"     Not asked  Protocols used: Urination Pain - Female-A-AH

## 2023-10-11 NOTE — Progress Notes (Signed)
Name: Heather Burke   MRN: [id number]    DOB: 04/10/1951   Date:10/11/2023       Progress Note  Subjective  Chief Complaint  Chief Complaint  Patient presents with   Urinary Frequency   Dysuria    Sx since Sunday, has been taking azo no relief, unable to do dipstick in office due to urine color being orange flaggs the whole dipstick abnormal    HPI  Symptoms started three days ago , she states dysuria, lower abdominal discomfort, no fever, chills or back pain. She has tried AZO and helped symptoms but did not resolve it. She denies vaginal itching or discharge. She was having multiple bowel movements while taking metformin and finally stopped last week    DMII uncontrolled. Last A1C was over 12, she is now on Lantus 16 units and Trulicity 1.5 mg, could not tolerate Metformin and stopped medication last  week. She gets Trulicity through assistance program and it may be good to go up on Trulicity dose to 3 mg. She has follow up with her PCP next month  Patient Active Problem List   Diagnosis Date Noted   Abnormal skin growth 08/09/2023   Sebaceous cyst of skin of breast 08/09/2023   Fatigue 05/09/2023   Eye pressure 07/15/2022   Stress and adjustment reaction 07/14/2022   Sore throat 04/27/2022   Chronic pain of right knee 01/14/2022   Need for diphtheria-tetanus-pertussis (Tdap) vaccine 01/14/2022   Encounter for subsequent annual wellness visit (AWV) in Medicare patient 01/14/2022   Alcohol dependence, daily use (HCC) 01/14/2022   Annual physical exam 01/14/2022   Hyperlipidemia associated with type 2 diabetes mellitus (HCC) 06/15/2021   Overweight 06/15/2021   Statin intolerance 12/29/2020   Benign neoplasm of sigmoid colon    Mass of right breast 02/17/2016   History of parotitis 01/05/2016   Hypothyroidism 09/29/2015   Hyperlipemia 09/29/2015   Newly recognized murmur 09/29/2015   Type 2 diabetes mellitus with other diabetic kidney complication (HCC) 08/27/2015    Avitaminosis D 08/27/2015   Compulsive tobacco user syndrome 08/27/2015   Hypertension associated with diabetes (HCC) 07/01/2015    Social History   Tobacco Use   Smoking status: Every Day    Current packs/day: 2.00    Average packs/day: 2.0 packs/day for 49.0 years (98.0 ttl pk-yrs)    Types: Cigarettes   Smokeless tobacco: Never  Substance Use Topics   Alcohol use: Yes    Alcohol/week: 14.0 - 28.0 standard drinks of alcohol    Types: 14 - 28 Cans of beer per week    Comment: 2-3 beers a day - declined cutting back anymore     Current Outpatient Medications:    ACCU-CHEK GUIDE test strip, USE 1 STIP TO CHECK BLOOD SUGAR IN THE MORNING, AT NOON, AND AT BEDTIME, Disp: 100 strip, Rfl: 0   Accu-Chek Softclix Lancets lancets, 1 EACH BY DOES NOT APPLY ROUTE IN THE MORNING, AT NOON, AND AT BEDTIME, Disp: 100 each, Rfl: 0   aspirin 81 MG tablet, Take 81 mg by mouth daily., Disp: , Rfl:    Blood Glucose Monitoring Suppl DEVI, 1 each by Does not apply route in the morning, at noon, and at bedtime. May substitute to any manufacturer covered by patient's insurance., Disp: 1 each, Rfl: 0   Cholecalciferol 125 MCG (5000 UT) TABS, Take 1 tablet by mouth daily., Disp: , Rfl:    diltiazem (DILT-XR) 240 MG 24 hr capsule, TAKE 1 CAPSULE BY MOUTH EVERY DAY,  Disp: 90 capsule, Rfl: 0   Dulaglutide (TRULICITY) 1.5 MG/0.5ML SOPN, Inject 1.5 mg into the skin once a week. Via [employer] patient assistance through Dec 2023, Disp: , Rfl:    fenofibrate (TRICOR) 145 MG tablet, Take 1 tablet (145 mg total) by mouth daily., Disp: 90 tablet, Rfl: 3   fluticasone (FLONASE) 50 MCG/ACT nasal spray, SPRAY 2 SPRAYS INTO EACH NOSTRIL EVERY DAY, Disp: 48 mL, Rfl: 2   icosapent Ethyl (VASCEPA) 1 g capsule, Take 2 g by mouth 2 (two) times daily., Disp: , Rfl:    Insulin Pen Needle (PEN NEEDLES 31GX5/16") 31G X 8 MM MISC, 1 each by Does not apply route daily., Disp: 100 each, Rfl: 11   latanoprost (XALATAN) 0.005 %  ophthalmic solution, 1 drop at bedtime., Disp: , Rfl:    levocetirizine (XYZAL) 5 MG tablet, Take 5 mg by mouth as needed., Disp: , Rfl:    levothyroxine (SYNTHROID) 88 MCG tablet, Take 1 tablet (88 mcg total) by mouth daily., Disp: 90 tablet, Rfl: 1   metoprolol (TOPROL-XL) 200 MG 24 hr tablet, TAKE 1 TABLET BY MOUTH EVERY DAY, Disp: 90 tablet, Rfl: 0   Misc Natural Products (T-RELIEF CBD+13 SL), Place 500 mg under the tongue daily., Disp: , Rfl:    Nutritional Supplements (QUINOA/KALE/HEMP PO), Place 750 mg under the tongue., Disp: , Rfl:    potassium chloride (KLOR-CON M10) 10 MEQ tablet, TAKE 1 TABLET BY MOUTH EVERY DAY, Disp: 90 tablet, Rfl: 1   valsartan-hydrochlorothiazide (DIOVAN-HCT) 320-12.5 MG tablet, Take 1 tablet by mouth daily., Disp: 90 tablet, Rfl: 3   insulin glargine (LANTUS) 100 UNIT/ML Solostar Pen, Inject 10 Units into the skin at bedtime. Titrate by 2 units every 3 days for fasting morning blood sugar >150. (Patient not taking: Reported on 08/16/2023), Disp: 15 mL, Rfl: 0  Allergies  Allergen Reactions   Amlodipine Nausea Only and Other (See Comments)    Stomach cramping   Atorvastatin Nausea And Vomiting   Lisinopril Cough   Pravastatin Nausea And Vomiting   Icosapent Ethyl Palpitations   Nifedipine Er Other (See Comments)    headache    ROS  Ten systems reviewed and is negative except as mentioned in HPI    Objective  Vitals:   10/11/23 1244  BP: 128/70  Pulse: 83  Resp: 16  Temp: 98.2 F (36.8 C)  TempSrc: Oral  SpO2: 94%  Weight: 147 lb 8 oz (66.9 kg)  Height: 5\' 2"  (1.575 m)    Body mass index is 26.98 kg/m.    Physical Exam  Constitutional: Patient appears well-developed and well-nourished. No distress.  HEENT: head atraumatic, normocephalic, pupils equal and reactive to light, neck supple Cardiovascular: Normal rate, regular rhythm and normal heart sounds.  No murmur heard. No BLE edema. Pulmonary/Chest: Effort normal and breath sounds  normal. No respiratory distress. Abdominal: Soft.  There is no tenderness. Negative CVA tenderness  Psychiatric: Patient has a normal mood and affect. behavior is normal. Judgment and thought content normal.   Recent Results (from the past 2160 hour(s))  CBC with Differential/Platelet     Status: None   Collection Time: 08/09/23 10:32 AM  Result Value Ref Range   WBC 6.5 3.4 - 10.8 x10E3/uL   RBC 4.54 3.77 - 5.28 x10E6/uL   Hemoglobin 14.4 11.1 - 15.9 g/dL   Hematocrit 62.1 30.8 - 46.6 %   MCV 91 79 - 97 fL   MCH 31.7 26.6 - 33.0 pg   MCHC 34.9 31.5 -  35.7 g/dL   RDW 16.1 09.6 - 04.5 %   Platelets 245 150 - 450 x10E3/uL   Neutrophils 56 Not Estab. %   Lymphs 27 Not Estab. %   Monocytes 9 Not Estab. %   Eos 7 Not Estab. %   Basos 1 Not Estab. %   Neutrophils Absolute 3.7 1.4 - 7.0 x10E3/uL   Lymphocytes Absolute 1.7 0.7 - 3.1 x10E3/uL   Monocytes Absolute 0.6 0.1 - 0.9 x10E3/uL   EOS (ABSOLUTE) 0.4 0.0 - 0.4 x10E3/uL   Basophils Absolute 0.1 0.0 - 0.2 x10E3/uL   Immature Granulocytes 0 Not Estab. %   Immature Grans (Abs) 0.0 0.0 - 0.1 x10E3/uL  Comprehensive Metabolic Panel (CMET)     Status: Abnormal   Collection Time: 08/09/23 10:32 AM  Result Value Ref Range   Glucose 435 (H) 70 - 99 mg/dL   BUN 8 8 - 27 mg/dL   Creatinine, Ser 4.09 0.57 - 1.00 mg/dL   eGFR 91 >81 XB/JYN/8.29   BUN/Creatinine Ratio 11 (L) 12 - 28   Sodium 128 (L) 134 - 144 mmol/L   Potassium 4.1 3.5 - 5.2 mmol/L   Chloride 86 (L) 96 - 106 mmol/L   CO2 25 20 - 29 mmol/L   Calcium 9.7 8.7 - 10.3 mg/dL   Total Protein 7.1 6.0 - 8.5 g/dL   Albumin 4.1 3.8 - 4.8 g/dL   Globulin, Total 3.0 1.5 - 4.5 g/dL   Bilirubin Total 0.4 0.0 - 1.2 mg/dL   Alkaline Phosphatase 90 44 - 121 IU/L   AST 27 0 - 40 IU/L   ALT 27 0 - 32 IU/L  Lipid panel     Status: Abnormal   Collection Time: 08/09/23 10:32 AM  Result Value Ref Range   Cholesterol, Total 163 100 - 199 mg/dL   Triglycerides 562 (H) 0 - 149 mg/dL   HDL  25 (L) >13 mg/dL   VLDL Cholesterol Cal 60 (H) 5 - 40 mg/dL   LDL Chol Calc (NIH) 78 0 - 99 mg/dL   Chol/HDL Ratio 6.5 (H) 0.0 - 4.4 ratio    Comment:                                   T. Chol/HDL Ratio                                             Men  Women                               1/2 Avg.Risk  3.4    3.3                                   Avg.Risk  5.0    4.4                                2X Avg.Risk  9.6    7.1                                3X Avg.Risk  23.4   11.0   TSH     Status: None   Collection Time: 08/09/23 10:32 AM  Result Value Ref Range   TSH 1.660 0.450 - 4.500 uIU/mL  Hemoglobin A1c     Status: Abnormal   Collection Time: 08/09/23 10:32 AM  Result Value Ref Range   Hgb A1c MFr Bld 12.2 (H) 4.8 - 5.6 %    Comment:          Prediabetes: 5.7 - 6.4          Diabetes: >6.4          Glycemic control for adults with diabetes: <7.0    Est. average glucose Bld gHb Est-mCnc 303 mg/dL  POCT Glucose (CBG)     Status: Abnormal   Collection Time: 10/11/23  1:05 PM  Result Value Ref Range   POC Glucose 240 (A) 70 - 99 mg/dl     Assessment & Plan   1. Dysuria  - CULTURE, URINE COMPREHENSIVE  2. Uncontrolled type 2 diabetes mellitus with hyperglycemia (HCC)  - POCT Glucose (CBG)    She is doing better, but glucose still over 190 fasting, she will continue to titrate up the dose of insulin and keep follow up with PCP next month

## 2023-10-11 NOTE — Telephone Encounter (Signed)
  Chief Complaint: UTI symptoms Symptoms: burning, frequency, urgency Frequency: Since Sunday Pertinent Negatives: Patient denies fever or blood in urine, no flank pain Disposition: [] ED /[] Urgent Care (no appt availability in office) / [x] Appointment(In office/virtual)/ []  [facility]/ [] Home Care/ [] Refused Recommended Disposition /[] [facility]/ []  Follow-up with PCP Additional Notes: No appts with [facility] so scheduled her with Danelle Berry, PA-C at [facility] for today at 1:00.  Made sure pt was aware to go to [facility].   She verbalized understanding.

## 2023-10-14 LAB — CULTURE, URINE COMPREHENSIVE
MICRO NUMBER:: 15637669
SPECIMEN QUALITY:: ADEQUATE

## 2023-10-17 ENCOUNTER — Encounter: Payer: Self-pay | Admitting: Family Medicine

## 2023-10-23 ENCOUNTER — Other Ambulatory Visit: Payer: PPO | Admitting: Pharmacist

## 2023-10-23 NOTE — Progress Notes (Unsigned)
10/23/2023 Name: Heather Burke MRN: [id number] DOB: June 17, 1951  Chief Complaint  Patient presents with   Medication Management   Medication Assistance    Heather Burke is a 72 y.o. year old female who presented for a telephone visit.   They were referred to the pharmacist by their PCP for assistance in managing diabetes and medication access.    Subjective:  Care Team: Primary Care Provider: Jacky Kindle, FNP ; Next Scheduled Visit: 11/09/2023  Medication Access/Adherence  Current Pharmacy:  [id number] Nicholes Rough, [facility] - [address] [place] [address] Phone: [phone] Fax: [phone]  [facility] - [place], [place] - [address] [place] [address] Phone: [phone] Fax: [phone]   Patient reports affordability concerns with their medications: No  Patient reports access/transportation concerns to their pharmacy: No  Patient reports adherence concerns with their medications:  No     Uses weekly pillbox  Reports urinary tract symptoms resolved with completing course of Setpra DS   Note that her sister passed away on July 18, 2023 and has previously shared that grief impacted her ability to eat for weeks, feeling chronically nauseated, and often eating more carbohydrates when she was able to eat - Reports has good support from her daughters with managing her grief    Today reports that she is again struggling with her appetite/energy level. Reports that her mood is okay, other than concern about her appetite. Reports also feels chronically cold. Reports current home weight ~144 lbs - Reports plans to discuss with PCP at upcoming appointment on 11/21; denies need to be seen sooner  Diabetes:   Current medications:  - Trulicity 1.5 mg weekly on Saturdays - Lantus 24 units each morning (reports increased to current dose starting yesterday) Confirms has been following  direction from PCP to titrate Lantus dose "by 2 units every 3 days for fasting morning blood sugar >150" if needed   Previous therapies tried: metformin (unable to tolerate due to GI side effects)   Reports home blood sugar readings have improved. Last checked this morning before breakfast, reading: 159   Patient denies hypoglycemic s/sx including dizziness, shakiness, sweating.    Current physical activity: walks for 20-30 minutes at mall x 2-4 days/week and active with grandkids   Current medication access support: Enrolled in [employer] patient assistance program for Trulicity - Reports has >3 month supply remaining - Reports has not yet received application for assistance program renewal as mailed by CPhT on 10/17, but plans to check mail and contact CPhT if not received   Hypertension:   Current medications:  - Diltiazem XR 240 mg daily - metoprolol ER 200 mg nightly - valsartan-HCTZ 320-12.5 mg daily   Patient has a validated, automated, upper arm home BP cuff Current blood pressure readings readings: last checked yesterday, reading: 136/80, HR 66   Admits to adding salt to her food   Current physical activity: walks for 20-30 minutes at mall x 2-4 days/week and active with grandkids     Hyperlipidemia/ASCVD Risk Reduction   Current lipid lowering medications: fenofibrate 145 mg daily & Vascepa 2 grams twice daily Medications tried in the past: rosuvastatin, lovastatin   Antiplatelet regimen: aspirin 81 mg daily     Current physical activity: walks for 20-30 minutes at mall x 2-4 days/week and active with grandkids   Tobacco Use: - Currently smoking ~2 packs of cigarettes/day - Denies interest in quitting at this time  Objective:  Lab Results  Component Value Date   HGBA1C 12.2 (H) 08/09/2023    Lab Results  Component Value Date   CREATININE 0.71 08/09/2023   BUN 8 08/09/2023   NA 128 (L) 08/09/2023   K 4.1 08/09/2023   CL 86 (L) 08/09/2023   CO2 25  08/09/2023    Lab Results  Component Value Date   CHOL 163 08/09/2023   HDL 25 (L) 08/09/2023   LDLCALC 78 08/09/2023   TRIG 371 (H) 08/09/2023   CHOLHDL 6.5 (H) 08/09/2023   BP Readings from Last 3 Encounters:  10/11/23 128/70  08/09/23 (!) 165/77  05/08/23 (!) 140/65   Pulse Readings from Last 3 Encounters:  10/11/23 83  08/09/23 73  05/08/23 74    Medications Reviewed Today     Reviewed by Manuela Neptune, RPH-CPP (Pharmacist) on 10/23/23 at 1216  Med List Status: <None>   Medication Order Taking? Sig Documenting Provider Last Dose Status Informant  ACCU-CHEK GUIDE test strip [id number]  USE 1 STIP TO CHECK BLOOD SUGAR IN THE MORNING, AT NOON, AND AT BEDTIME Jacky Kindle, FNP  Active   Accu-Chek Softclix Lancets lancets [id number]  1 EACH BY DOES NOT APPLY ROUTE IN THE MORNING, AT NOON, AND AT BEDTIME Jacky Kindle, FNP  Active   aspirin 81 MG tablet [id number]  Take 81 mg by mouth daily. [provider]  Active   Blood Glucose Monitoring Suppl DEVI [id number]  1 each by Does not apply route in the morning, at noon, and at bedtime. May substitute to any manufacturer covered by patient's insurance. Jacky Kindle, FNP  Active   Cholecalciferol 125 MCG (5000 UT) TABS [id number]  Take 1 tablet by mouth daily. [provider]  Active   diltiazem (DILT-XR) 240 MG 24 hr capsule [id number]  TAKE 1 CAPSULE BY MOUTH EVERY DAY Jacky Kindle, FNP  Active   Dulaglutide (TRULICITY) 1.5 MG/0.5ML SOPN [id number] Yes Inject 1.5 mg into the skin once a week. Via [employer] patient assistance through Dec 2023 Beryle Flock, Marzella Schlein, MD Taking Active   fenofibrate (TRICOR) 145 MG tablet [id number]  Take 1 tablet (145 mg total) by mouth daily. Jacky Kindle, FNP  Active   fluticasone [facility]) 50 MCG/ACT nasal spray [id number]  SPRAY 2 SPRAYS INTO EACH NOSTRIL EVERY DAY Jacky Kindle, FNP  Active   icosapent Ethyl (VASCEPA) 1 g capsule [id number]  Take 2 g by mouth 2 (two) times  daily. [provider]  Active   insulin glargine (LANTUS) 100 UNIT/ML Solostar Pen [id number] Yes Inject 10 Units into the skin at bedtime. Titrate by 2 units every 3 days for fasting morning blood sugar >150.  Patient taking differently: Inject 24 Units into the skin daily. Titrate by 2 units every 3 days for fasting morning blood sugar >150.   Jacky Kindle, FNP Taking Active   Insulin Pen Needle (PEN NEEDLES 31GX5/16") 31G X 8 MM MISC [id number]  1 each by Does not apply route daily. Jacky Kindle, FNP  Active   latanoprost (XALATAN) 0.005 % ophthalmic solution [id number]  1 drop at bedtime. [provider]  Active   levocetirizine (XYZAL) 5 MG tablet [id number]  Take 5 mg by mouth as needed. [provider]  Active   levothyroxine (SYNTHROID) 88 MCG tablet [id number]  Take 1 tablet (88 mcg total) by mouth daily. Jacky Kindle, FNP  Active   metoprolol (TOPROL-XL) 200 MG 24 hr tablet [id number]  TAKE 1 TABLET BY MOUTH EVERY DAY Jacky Kindle, FNP  Active   Misc Natural Products (T-RELIEF CBD+13 SL) [id number] No Place 500 mg under the tongue daily.  Patient not taking: Reported on 10/23/2023   [provider] Not Taking Active Self  Nutritional Supplements (QUINOA/KALE/HEMP PO) [id number]  Place 750 mg under the tongue. [provider]  Active   potassium chloride (KLOR-CON M10) 10 MEQ tablet [id number]  TAKE 1 TABLET BY MOUTH EVERY DAY Jacky Kindle, FNP  Active   valsartan-hydrochlorothiazide (DIOVAN-HCT) 320-12.5 MG tablet [id number] Yes Take 1 tablet by mouth daily. Jacky Kindle, FNP Taking Active               Assessment/Plan:   Will collaborate with PCP regarding patient's concerns about her appetite/energy level.  Have advised patient to consider following up with PCP sooner  Diabetes: - Currently uncontrolled - Reviewed long term cardiovascular and renal outcomes of uncontrolled blood sugar - Reviewed goal A1c, goal fasting, and  goal 2 hour post prandial glucose - Reviewed dietary modifications including importance of having regular well-balanced meals and snacks throughout the day, while controlling carbohydrate portion sizes - Have counseled patient on s/s of low blood sugar and how to treat lows (and send handout via MyChart) - Recommend to check glucose twice daily (morning fasting & 2-hours after a meal), keep log of results and have this record to review at upcoming medical appointments. Patient to contact provider office sooner if needed for readings outside of established parameters or symptoms - Collaborating with PCP and CPhT to aid patient with applying for Trulicity patient assistance program re-enrollment   Hypertension: - Have reviewed long term cardiovascular and renal outcomes of uncontrolled blood pressure - Recommend to continue to monitor home blood pressure, keep log of results and have this record to review at upcoming medical appointments. Patient to contact provider office sooner if needed for readings outside of established parameters or symptoms     Hyperlipidemia/ASCVD Risk Reduction: - Currently uncontrolled.  - Have reviewed long term complications of uncontrolled cholesterol - Recommend to take fenofibrate and Vascepa as directed. Plan to reassess following next lipid panel lab work - Reports generic Vascepa affordable for now, but will let clinical pharmacist know if needing help applying for grant for patient assistance in future   Tobacco Abuse - Provided motivational interviewing to assess tobacco use - Have provided information on 1 800 QUIT NOW support program       Follow Up Plan: Clinical Pharmacist will follow up with patient by telephone on 11/29/2023 at 1:00 PM     Estelle Grumbles, PharmD, [facility] [phone]

## 2023-10-24 NOTE — Patient Instructions (Signed)
Goals Addressed             This Visit's Progress    Pharmacy Goals       Please watch the mail for an envelope from [facility] containing the patient assistance program application. Please complete this application and mail back to [facility] Pharmacy Technician Noreene Larsson [place] along with a copy of your Medicare Part D prescription card and a copy of your proof of income document OR you can bring these documents to the office to have them faxed back to Attention: Pattricia Boss at Fax # [phone]   If you need to call Noreene Larsson, you can reach her at [phone]  The goal A1c is less than 7%. This is the best way to reduce the risk of the long term complications of diabetes, including heart disease, kidney disease, eye disease, strokes, and nerve damage. An A1c of less than 7% corresponds with fasting sugars less than 130 and 2 hour after meal sugars less than 180. Please check your blood sugar twice daily.   Check your blood pressure once daily, and any time you have concerning symptoms like headache, chest pain, dizziness, shortness of breath, or vision changes.   Our goal is less than 130/80.  To appropriately check your blood pressure, make sure you do the following:  1) Avoid caffeine, exercise, or tobacco products for 30 minutes before checking. Empty your bladder. 2) Sit with your back supported in a flat-backed chair. Rest your arm on something flat (arm of the chair, table, etc). 3) Sit still with your feet flat on the floor, resting, for at least 5 minutes.  4) Check your blood pressure. Take 1-2 readings.  5) Write down these readings and bring with you to any provider appointments.  Bring your home blood pressure machine with you to a provider's office for accuracy comparison at least once a year.   Make sure you take your blood pressure medications before you come to any office visit, even if you were asked to fast for labs.  Please consider following up with the [place] [employer]  for their help with quitting smoking.  The [place] [employer]uitline phone number is: [phone]  Estelle Grumbles, PharmD, [facility] [phone]

## 2023-11-06 ENCOUNTER — Other Ambulatory Visit: Payer: Self-pay | Admitting: Family Medicine

## 2023-11-07 NOTE — Telephone Encounter (Signed)
Requested Prescriptions  Pending Prescriptions Disp Refills   ACCU-CHEK GUIDE TEST test strip [Pharmacy Med Name: ACCU-CHEK GUIDE TEST STRIP] 100 strip 0    Sig: USE 1 STIP TO CHECK BLOOD SUGAR IN THE MORNING, AT NOON, AND AT BEDTIME     There is no refill protocol information for this order

## 2023-11-09 ENCOUNTER — Ambulatory Visit: Payer: PPO | Admitting: Family Medicine

## 2023-11-09 VITALS — BP 147/80 | HR 75 | Temp 97.8°F | Resp 16 | Ht 62.0 in | Wt 146.0 lb

## 2023-11-09 DIAGNOSIS — E1159 Type 2 diabetes mellitus with other circulatory complications: Secondary | ICD-10-CM

## 2023-11-09 DIAGNOSIS — F1020 Alcohol dependence, uncomplicated: Secondary | ICD-10-CM

## 2023-11-09 DIAGNOSIS — F17200 Nicotine dependence, unspecified, uncomplicated: Secondary | ICD-10-CM

## 2023-11-09 DIAGNOSIS — E1129 Type 2 diabetes mellitus with other diabetic kidney complication: Secondary | ICD-10-CM | POA: Diagnosis not present

## 2023-11-09 DIAGNOSIS — I152 Hypertension secondary to endocrine disorders: Secondary | ICD-10-CM | POA: Diagnosis not present

## 2023-11-09 DIAGNOSIS — Z794 Long term (current) use of insulin: Secondary | ICD-10-CM

## 2023-11-09 LAB — POCT GLYCOSYLATED HEMOGLOBIN (HGB A1C): Hemoglobin A1C: 10.2 % — AB (ref 4.0–5.6)

## 2023-11-09 MED ORDER — INSULIN GLARGINE 100 UNIT/ML SOLOSTAR PEN: 40.0000 [IU] | PEN_INJECTOR | Freq: Every day | SUBCUTANEOUS | 11 refills | Status: DC

## 2023-11-09 NOTE — Progress Notes (Signed)
Established patient visit   Patient: Heather Burke   DOB: 03-02-1951   72 y.o. Female  MRN: [id number] Visit Date: 11/09/2023  Today's healthcare provider: Jacky Kindle, FNP  Re Introduced to nurse practitioner role and practice setting.  All questions answered.  Discussed provider/patient relationship and expectations.  Chief Complaint  Patient presents with   Diabetes    Patient was last seen 3 months ago. No management changes were made at that time.  However, patient was started on insulin on 09/27/23.  Since starting insulin her fasting readings have been in the range of 70's-118 and random 150-295.  She is currently up to 34 units of Lantus daily.   Hypertension    Patient was seen 3 months ago at which time her Losartan was changed to Diovan.  Patient has been getting home readings of 140's over 80s   Subjective    Diabetes  Hypertension   HPI     Diabetes    Additional comments: Patient was last seen 3 months ago. No management changes were made at that time.  However, patient was started on insulin on 09/27/23.  Since starting insulin her fasting readings have been in the range of 70's-118 and random 150-295.  She is currently up to 34 units of Lantus daily.        Hypertension    Additional comments: Patient was seen 3 months ago at which time her Losartan was changed to Diovan.  Patient has been getting home readings of 140's over 80s      Last edited by Adline Peals, CMA on 11/09/2023 11:03 AM.      The patient, with a history of diabetes, presents with concerns about appetite and energy levels. They report that some days they can only consume a few bites of food, and they often feel tired. However, they note that their appetite has improved over the past week, and they are not as tired as before. They also mention a recent family loss, which may have contributed to their decreased appetite and energy levels.  The patient's blood sugar levels have  been a concern. They report that their blood sugar levels have been better in the morning, but still high in the afternoon. They have been adjusting their insulin dosage under the guidance of their doctor and a pharmacist. They started taking insulin shots on October 9th and have been monitoring their blood sugar levels twice a day since then. They also mention that they have lost some weight, about thirty pounds, which they attribute to changes in their diet and medication regimen.  The patient also discusses their blood pressure, which appears to be stable. They have been taking valsartan and fenofibrate, which were recently added to their medication regimen. They also mention having an eye exam in September and not having any concerns with their feet.  Medications: Outpatient Medications Prior to Visit  Medication Sig   ACCU-CHEK GUIDE TEST test strip USE 1 STIP TO CHECK BLOOD SUGAR IN THE MORNING, AT NOON, AND AT BEDTIME   Accu-Chek Softclix Lancets lancets 1 EACH BY DOES NOT APPLY ROUTE IN THE MORNING, AT NOON, AND AT BEDTIME   aspirin 81 MG tablet Take 81 mg by mouth daily.   Blood Glucose Monitoring Suppl DEVI 1 each by Does not apply route in the morning, at noon, and at bedtime. May substitute to any manufacturer covered by patient's insurance.   Cholecalciferol 125 MCG (5000 UT) TABS Take 1  tablet by mouth daily.   diltiazem (DILT-XR) 240 MG 24 hr capsule TAKE 1 CAPSULE BY MOUTH EVERY DAY   Dulaglutide (TRULICITY) 1.5 MG/0.5ML SOPN Inject 1.5 mg into the skin once a week. Via [employer] patient assistance through Dec 2023   fenofibrate (TRICOR) 145 MG tablet Take 1 tablet (145 mg total) by mouth daily.   fluticasone (FLONASE) 50 MCG/ACT nasal spray SPRAY 2 SPRAYS INTO EACH NOSTRIL EVERY DAY   icosapent Ethyl (VASCEPA) 1 g capsule Take 2 g by mouth 2 (two) times daily.   Insulin Pen Needle (PEN NEEDLES 31GX5/16") 31G X 8 MM MISC 1 each by Does not apply route daily.   latanoprost  (XALATAN) 0.005 % ophthalmic solution 1 drop at bedtime.   levocetirizine (XYZAL) 5 MG tablet Take 5 mg by mouth as needed.   levothyroxine (SYNTHROID) 88 MCG tablet Take 1 tablet (88 mcg total) by mouth daily.   metoprolol (TOPROL-XL) 200 MG 24 hr tablet TAKE 1 TABLET BY MOUTH EVERY DAY   Misc Natural Products (T-RELIEF CBD+13 SL) Place 500 mg under the tongue daily.   Nutritional Supplements (QUINOA/KALE/HEMP PO) Place 750 mg under the tongue.   potassium chloride (KLOR-CON M10) 10 MEQ tablet TAKE 1 TABLET BY MOUTH EVERY DAY   valsartan-hydrochlorothiazide (DIOVAN-HCT) 320-12.5 MG tablet Take 1 tablet by mouth daily.   [DISCONTINUED] insulin glargine (LANTUS) 100 UNIT/ML Solostar Pen Inject 10 Units into the skin at bedtime. Titrate by 2 units every 3 days for fasting morning blood sugar >150. (Patient taking differently: Inject 24 Units into the skin daily. Titrate by 2 units every 3 days for fasting morning blood sugar >150.)   No facility-administered medications prior to visit.    Review of Systems Last CBC Lab Results  Component Value Date   WBC 6.5 08/09/2023   HGB 14.4 08/09/2023   HCT 41.3 08/09/2023   MCV 91 08/09/2023   MCH 31.7 08/09/2023   RDW 12.1 08/09/2023   PLT 245 08/09/2023   Last metabolic panel Lab Results  Component Value Date   GLUCOSE 435 (H) 08/09/2023   NA 128 (L) 08/09/2023   K 4.1 08/09/2023   CL 86 (L) 08/09/2023   CO2 25 08/09/2023   BUN 8 08/09/2023   CREATININE 0.71 08/09/2023   EGFR 91 08/09/2023   CALCIUM 9.7 08/09/2023   PROT 7.1 08/09/2023   ALBUMIN 4.1 08/09/2023   LABGLOB 3.0 08/09/2023   AGRATIO 1.3 07/14/2022   BILITOT 0.4 08/09/2023   ALKPHOS 90 08/09/2023   AST 27 08/09/2023   ALT 27 08/09/2023   ANIONGAP 13 01/16/2018   Last lipids Lab Results  Component Value Date   CHOL 163 08/09/2023   HDL 25 (L) 08/09/2023   LDLCALC 78 08/09/2023   TRIG 371 (H) 08/09/2023   CHOLHDL 6.5 (H) 08/09/2023   Last hemoglobin A1c Lab  Results  Component Value Date   HGBA1C 10.2 (A) 11/09/2023   Last thyroid functions Lab Results  Component Value Date   TSH 1.660 08/09/2023     Objective    BP (!) 147/80 (BP Location: Right Arm, Patient Position: Sitting, Cuff Size: Normal)   Pulse 75   Temp 97.8 F (36.6 C) (Oral)   Resp 16   Ht 5\' 2"  (1.575 m)   Wt 146 lb (66.2 kg)   BMI 26.70 kg/m   BP Readings from Last 3 Encounters:  11/09/23 (!) 147/80  10/11/23 128/70  08/09/23 (!) 165/77   Wt Readings from Last 3 Encounters:  11/09/23 146 lb (66.2 kg)  10/11/23 147 lb 8 oz (66.9 kg)  08/09/23 165 lb 1.6 oz (74.9 kg)   Physical Exam Vitals and nursing note reviewed.  Constitutional:      General: She is not in acute distress.    Appearance: Normal appearance. She is overweight. She is not ill-appearing, toxic-appearing or diaphoretic.  HENT:     Head: Normocephalic and atraumatic.  Cardiovascular:     Rate and Rhythm: Normal rate and regular rhythm.     Pulses: Normal pulses.     Heart sounds: Normal heart sounds. No murmur heard.    No friction rub. No gallop.  Pulmonary:     Effort: Pulmonary effort is normal. No respiratory distress.     Breath sounds: Normal breath sounds. No stridor. No wheezing, rhonchi or rales.  Chest:     Chest wall: No tenderness.  Abdominal:     General: Bowel sounds are normal.     Palpations: Abdomen is soft.  Musculoskeletal:        General: No swelling, tenderness, deformity or signs of injury. Normal range of motion.     Right lower leg: No edema.     Left lower leg: No edema.  Skin:    General: Skin is warm and dry.     Capillary Refill: Capillary refill takes less than 2 seconds.     Coloration: Skin is not jaundiced or pale.     Findings: No bruising, erythema, lesion or rash.  Neurological:     General: No focal deficit present.     Mental Status: She is alert and oriented to person, place, and time. Mental status is at baseline.     Cranial Nerves: No  cranial nerve deficit.     Sensory: No sensory deficit.     Motor: No weakness.     Coordination: Coordination normal.  Psychiatric:        Mood and Affect: Mood normal.        Behavior: Behavior normal.        Thought Content: Thought content normal.        Judgment: Judgment normal.     Results for orders placed or performed in visit on 11/09/23  POCT glycosylated hemoglobin (Hb A1C)  Result Value Ref Range   Hemoglobin A1C 10.2 (A) 4.0 - 5.6 %   HbA1c POC (<> result, manual entry)     HbA1c, POC (prediabetic range)     HbA1c, POC (controlled diabetic range)      Assessment & Plan     Problem List Items Addressed This Visit       Endocrine   Type 2 diabetes mellitus with other diabetic kidney complication (HCC) - Primary   Relevant Medications   insulin glargine (LANTUS) 100 UNIT/ML Solostar Pen   Other Relevant Orders   POCT glycosylated hemoglobin (Hb A1C) (Completed)   Type 2 Diabetes Mellitus Recent improvement in glycemic control with A1c reduction from 12.3 to 10.2. Patient reports inconsistent appetite and energy levels, but recent improvement. Patient titrating Lantus insulin, currently at 34 units daily, with some blood glucose readings in the 70s-80s. -Continue current regimen and titrate Lantus insulin as needed, aiming for fasting blood glucose 80-90. -Check A1c in 3 months. -Refill Lantus insulin prescription at 40 units daily to cover potential titration.  Hypertension Stable on valsartan. -Continue valsartan as prescribed. -Refill valsartan prescription if needed.  General Health Maintenance -Ensure annual eye exam has been completed. -Continue monitoring for diabetic foot complications.  Return in about 3 months (around 02/09/2024) for T2DM management.      Leilani Merl, FNP, have reviewed all documentation for this visit. The documentation on 11/12/23 for the exam, diagnosis, procedures, and orders are all accurate and complete.  Jacky Kindle, FNP  [facility] [phone] (phone) [phone] (fax)  [facility]

## 2023-11-17 ENCOUNTER — Other Ambulatory Visit: Payer: Self-pay | Admitting: Family Medicine

## 2023-11-17 DIAGNOSIS — E1159 Type 2 diabetes mellitus with other circulatory complications: Secondary | ICD-10-CM

## 2023-11-20 NOTE — Telephone Encounter (Signed)
Requested by interface surescripts. Future visit in 2 months.  Requested Prescriptions  Pending Prescriptions Disp Refills   DILT-XR 240 MG 24 hr capsule [Pharmacy Med Name: DILT XR 240 MG CAPSULE] 90 capsule 0    Sig: TAKE 1 CAPSULE BY MOUTH EVERY DAY     Cardiovascular: Calcium Channel Blockers 3 Failed - 11/17/2023  1:47 AM      Failed - Last BP in normal range    BP Readings from Last 1 Encounters:  11/09/23 (!) 147/80         Passed - ALT in normal range and within 360 days    ALT  Date Value Ref Range Status  08/09/2023 27 0 - 32 IU/L Final         Passed - AST in normal range and within 360 days    AST  Date Value Ref Range Status  08/09/2023 27 0 - 40 IU/L Final         Passed - Cr in normal range and within 360 days    Creatinine, Ser  Date Value Ref Range Status  08/09/2023 0.71 0.57 - 1.00 mg/dL Final         Passed - Last Heart Rate in normal range    Pulse Readings from Last 1 Encounters:  11/09/23 75         Passed - Valid encounter within last 6 months    Recent Outpatient Visits           1 week ago Type 2 diabetes mellitus with other diabetic kidney complication [facility])   [facility] Jacky Kindle, FNP   1 month ago Dysuria   [facility], Danna Hefty, MD   3 months ago Type 2 diabetes mellitus with other diabetic kidney complication [facility])   [facility] [name] T, FNP   10 months ago Hypertension associated with diabetes [facility])   [facility] [name] T, FNP   1 year ago Type 2 diabetes mellitus with other diabetic kidney complication [facility])   [place] [facility] Jacky Kindle, FNP       Future Appointments             In 2 months Jacky Kindle, FNP [facility]             metoprolol (TOPROL-XL) 200 MG 24 hr tablet [Pharmacy Med Name: METOPROLOL SUCC ER 200 MG TAB] 90  tablet 0    Sig: TAKE 1 TABLET BY MOUTH EVERY DAY     Cardiovascular:  Beta Blockers Failed - 11/17/2023  1:47 AM      Failed - Last BP in normal range    BP Readings from Last 1 Encounters:  11/09/23 (!) 147/80         Passed - Last Heart Rate in normal range    Pulse Readings from Last 1 Encounters:  11/09/23 75         Passed - Valid encounter within last 6 months    Recent Outpatient Visits           1 week ago Type 2 diabetes mellitus with other diabetic kidney complication [facility])   [facility] Jacky Kindle, FNP   1 month ago Dysuria   [facility] Alba Cory, MD   3 months ago Type 2 diabetes mellitus with other diabetic kidney complication (HCC)  [facility] [name] T, FNP   10 months ago Hypertension associated with diabetes [facility])   [place] [name] T, FNP   1 year ago Type 2 diabetes mellitus with other diabetic kidney complication [facility])   [place] Jacky Kindle, FNP       Future Appointments             In 2 months Jacky Kindle, FNP [facility]

## 2023-11-22 ENCOUNTER — Other Ambulatory Visit: Payer: Self-pay | Admitting: Family Medicine

## 2023-11-22 DIAGNOSIS — J302 Other seasonal allergic rhinitis: Secondary | ICD-10-CM

## 2023-11-29 ENCOUNTER — Encounter: Payer: Self-pay | Admitting: Pharmacist

## 2023-11-29 ENCOUNTER — Other Ambulatory Visit: Payer: Self-pay | Admitting: Pharmacist

## 2023-11-29 DIAGNOSIS — E1129 Type 2 diabetes mellitus with other diabetic kidney complication: Secondary | ICD-10-CM

## 2023-11-29 MED ORDER — FREESTYLE LIBRE 3 PLUS SENSOR MISC
12 refills | Status: DC
Start: 2023-11-29 — End: 2024-04-29

## 2023-11-29 NOTE — Progress Notes (Signed)
12/01/2023 Name: Heather Burke MRN: [id number] DOB: [birth year]  Chief Complaint  Patient presents with   Medication Management   Medication Adherence    Heather Burke is a 72 y.o. year old female who presented for a telephone visit.   They were referred to the pharmacist by their PCP for assistance in managing diabetes and medication access.      Subjective:   Care Team: Primary Care Provider: Jacky Kindle, FNP ; Next Scheduled Visit: 12/29/2023    Medication Access/Adherence  Current Pharmacy:  [id number] [place]oles Rough, [facility] - [address] [place] [address] Phone: [phone] Fax: [phone]  [facility] [place] [place] - [address] [place] [address] Phone: [phone] Fax: [phone]   Patient reports affordability concerns with their medications: No  Patient reports access/transportation concerns to their pharmacy: No  Patient reports adherence concerns with their medications:  No     Uses weekly pillbox   Reports has had an improvement in appetite and maintaining her weight   Diabetes:   Current medications:  - Trulicity 1.5 mg weekly on Saturdays - Lantus 36 units each morning (reports increased to current dose starting yesterday)   Previous therapies tried: metformin (unable to tolerate due to GI side effects)   Reports home blood sugar readings have improved. Recent morning fasting ranging 69-129; 2 hours after supper 147-222  Reports feeling better and having more energy since blood sugar under better control   Patient denies hypoglycemic s/sx including dizziness, shakiness, sweating.  - Note patient carries glucose tablets   Current physical activity: walks for 20-30 minutes at mall x 2-4 days/week and active with grandkids   Current medication access support: Enrolled in [employer] patient assistance program for Trulicity - Reports has >1 month  supply remaining - Reports received application for assistance program renewal as mailed by CPhT and waiting on income document   Hypertension:   Current medications:  - Diltiazem XR 240 mg daily - metoprolol ER 200 mg nightly - valsartan-HCTZ 320-12.5 mg daily   Patient has a validated, automated, upper arm home BP cuff Current blood pressure readings readings: last checked 11/24, reading: 130/67, HR 59   Admits to adding salt to her food when cooks   Current physical activity: walks for 20-30 minutes at mall x 2-4 days/week and active with grandkids     Hyperlipidemia/ASCVD Risk Reduction   Current lipid lowering medications: fenofibrate 145 mg daily & Vascepa 2 grams twice daily Medications tried in the past: rosuvastatin, lovastatin   Antiplatelet regimen: aspirin 81 mg daily   Current physical activity: walks for 20-30 minutes at mall x 2-4 days/week and active with grandkids     Tobacco Use: - Currently smoking ~2 packs of cigarettes/day - Denies interest in quitting at this time     Objective:  Lab Results  Component Value Date   HGBA1C 10.2 (A) 11/09/2023    Lab Results  Component Value Date   CREATININE 0.71 08/09/2023   BUN 8 08/09/2023   NA 128 (L) 08/09/2023   K 4.1 08/09/2023   CL 86 (L) 08/09/2023   CO2 25 08/09/2023    Lab Results  Component Value Date   CHOL 163 08/09/2023   HDL 25 (L) 08/09/2023   LDLCALC 78 08/09/2023   TRIG 371 (H) 08/09/2023   CHOLHDL 6.5 (H) 08/09/2023   BP Readings from Last 3 Encounters:  11/09/23 (!) 147/80  10/11/23 128/70  08/09/23 (!) 165/77   Pulse Readings from Last 3 Encounters:  11/09/23 75  10/11/23 83  08/09/23 73     Medications Reviewed Today     Reviewed by Manuela Neptune, RPH-CPP (Pharmacist) on 12/01/23 at 1539  Med List Status: <None>   Medication Order Taking? Sig Documenting Provider Last Dose Status Informant  ACCU-CHEK GUIDE TEST test strip [id number]  USE 1 STIP TO CHECK BLOOD  SUGAR IN THE MORNING, AT NOON, AND AT BEDTIME Jacky Kindle, FNP  Active   Accu-Chek Softclix Lancets lancets [id number]  1 EACH BY DOES NOT APPLY ROUTE IN THE MORNING, AT NOON, AND AT BEDTIME Jacky Kindle, FNP  Active   aspirin 81 MG tablet [id number]  Take 81 mg by mouth daily. [provider]  Active   Blood Glucose Monitoring Suppl DEVI [id number]  1 each by Does not apply route in the morning, at noon, and at bedtime. May substitute to any manufacturer covered by patient's insurance. Jacky Kindle, FNP  Active   Cholecalciferol 125 MCG (5000 UT) TABS [id number]  Take 1 tablet by mouth daily. [provider]  Active   Continuous Glucose Sensor (FREESTYLE LIBRE 3 PLUS SENSOR) MISC [id number] Yes Place 1 sensor on the skin every 15 days. Use to check glucose continuously Jacky Kindle, FNP  Active   DILT-XR 240 MG 24 hr capsule [id number]  TAKE 1 CAPSULE BY MOUTH EVERY DAY [name] T, FNP  Active   Dulaglutide (TRULICITY) 1.5 MG/0.5ML SOPN [id number] Yes Inject 1.5 mg into the skin once a week. Via [employer] patient assistance through Dec 2023 Beryle Flock, Marzella Schlein, MD Taking Active   fenofibrate (TRICOR) 145 MG tablet [id number]  Take 1 tablet (145 mg total) by mouth daily. Jacky Kindle, FNP  Active   fluticasone [facility]) 50 MCG/ACT nasal spray [id number]  SPRAY 2 SPRAYS INTO EACH NOSTRIL EVERY DAY Jacky Kindle, FNP  Active   icosapent Ethyl (VASCEPA) 1 g capsule [id number]  Take 2 g by mouth 2 (two) times daily. [provider]  Active   insulin glargine (LANTUS) 100 UNIT/ML Solostar Pen [id number] Yes Inject 40 Units into the skin at bedtime. Titrate by 2 units every 3 days for fasting morning blood sugar >150.  Patient taking differently: Inject 36 Units into the skin at bedtime. Titrate by 2 units every 3 days for fasting morning blood sugar >150.   Jacky Kindle, FNP Taking Active   Insulin Pen Needle (PEN NEEDLES 31GX5/16") 31G X 8 MM MISC [id number]  1 each by  Does not apply route daily. Jacky Kindle, FNP  Active   latanoprost (XALATAN) 0.005 % ophthalmic solution [id number]  1 drop at bedtime. [provider]  Active   levocetirizine (XYZAL) 5 MG tablet [id number]  Take 5 mg by mouth as needed. [provider]  Active   levothyroxine (SYNTHROID) 88 MCG tablet [id number]  Take 1 tablet (88 mcg total) by mouth daily. [name] T, FNP  Active   metoprolol (TOPROL-XL) 200 MG 24 hr tablet [id number]  TAKE 1 TABLET BY MOUTH EVERY DAY Jacky Kindle, FNP  Active   Misc Natural Products (T-RELIEF CBD+13 SL) [id number]  Place 500 mg under the tongue daily. [provider]  Active Self  Nutritional Supplements (QUINOA/KALE/HEMP PO) [id number]  Place 750 mg under the tongue. [provider]  Active   potassium chloride (KLOR-CON M10) 10 MEQ tablet [id number]  TAKE 1 TABLET BY MOUTH  EVERY DAY Jacky Kindle, FNP  Active   valsartan-hydrochlorothiazide (DIOVAN-HCT) 320-12.5 MG tablet [id number]  Take 1 tablet by mouth daily. Jacky Kindle, FNP  Active               Assessment/Plan:   Diabetes: - Currently uncontrolled - Reviewed long term cardiovascular and renal outcomes of uncontrolled blood sugar - Reviewed goal A1c, goal fasting, and goal 2 hour post prandial glucose - Reviewed dietary modifications including importance of having regular well-balanced meals and snacks throughout the day, while controlling carbohydrate portion sizes  Discuss having daily bedtime balanced snack to help prevent low blood sugar readings - Have counseled patient on s/s of low blood sugar and how to treat lows - Send prescription for [facility] 3 plus continuous glucose monitoring sensor to pharmacy for patient per protocol  Follow up with pharmacy and confirm sensors available and covered with no charge to patient - Advise patient to review Freestyle Libre 3 setup videos from manufacturer website and reach out to clinical pharmacist,  pharmacy or office with any questions prior to starting use of this device Send patient MyChart message with links Provide patient with phone number for MyFreeStyle Support at [phone]. Their customer support is available 7 days a week 8 am to 8 pm - Recommend to use Freestyle Libre 3 Plus CGM to monitor blood sugar/as feedback on dietary choices - Recommend to check glucose with fingerstick check when needed for symptoms and as back up to CGM. Patient to contact office if needed for readings outside of established parameters or symptoms - Collaborating with PCP and CPhT to aid patient with applying for Trulicity patient assistance program re-enrollment   Hypertension: - Have reviewed long term cardiovascular and renal outcomes of uncontrolled blood pressure - Recommend to continue to monitor home blood pressure, keep log of results and have this record to review at upcoming medical appointments. Patient to contact provider office sooner if needed for readings outside of established parameters or symptoms     Hyperlipidemia/ASCVD Risk Reduction: - Currently uncontrolled.  - Have reviewed long term complications of uncontrolled cholesterol - Recommend to take fenofibrate and Vascepa as directed. Plan to reassess following next lipid panel lab work    Tobacco Abuse - Provided motivational interviewing to assess tobacco use - Have provided information on 1 800 QUIT NOW support program     Follow Up Plan: Clinical Pharmacist will follow up with patient by telephone on 1/10 at 10 am      Estelle Grumbles, PharmD, [facility] [phone]

## 2023-12-01 NOTE — Patient Instructions (Signed)
Goals Addressed             This Visit's Progress    Pharmacy Goals       Please copy and paste the following web address into your web browser for the [employer] 3 Videos:   [email or web address]   As we discussed, you will need to download the Freestyle Libre 3 App to your smart phone in order to use your phone in place of the reader device   Please remember that you will need to apply a new Freestyle Libre 3 Plus sensor every 15 days   Also, you can reach out to the MyFreeStyle Support at [phone]. Their customer support is available 7 days a week 8 am to 8 pm.   Please review the video above and call me, your provider or the pharmacy with any questions before starting to use this device.  The goal A1c is less than 7%. This is the best way to reduce the risk of the long term complications of diabetes, including heart disease, kidney disease, eye disease, strokes, and nerve damage. An A1c of less than 7% corresponds with fasting sugars less than 130 and 2 hour after meal sugars less than 180. Please check your blood sugar twice daily.   Check your blood pressure once daily, and any time you have concerning symptoms like headache, chest pain, dizziness, shortness of breath, or vision changes.   Our goal is less than 130/80.  To appropriately check your blood pressure, make sure you do the following:  1) Avoid caffeine, exercise, or tobacco products for 30 minutes before checking. Empty your bladder. 2) Sit with your back supported in a flat-backed chair. Rest your arm on something flat (arm of the chair, table, etc). 3) Sit still with your feet flat on the floor, resting, for at least 5 minutes.  4) Check your blood pressure. Take 1-2 readings.  5) Write down these readings and bring with you to any provider appointments.  Bring your home blood pressure machine with you to a provider's office for accuracy comparison at least once a year.    Make sure you take your blood pressure medications before you come to any office visit, even if you were asked to fast for labs.  Please consider following up with the [place] Quitline for their help with quitting smoking.  The [place] Quitline phone number is: [phone]  Estelle Grumbles, PharmD, [facility] [phone]

## 2023-12-05 ENCOUNTER — Encounter: Payer: Self-pay | Admitting: Family Medicine

## 2023-12-05 NOTE — Telephone Encounter (Signed)
 Care team updated and letter sent for eye exam notes.

## 2023-12-06 ENCOUNTER — Telehealth: Payer: Self-pay | Admitting: Pharmacy Technician

## 2023-12-06 ENCOUNTER — Other Ambulatory Visit: Payer: Self-pay | Admitting: Family Medicine

## 2023-12-06 DIAGNOSIS — Z5986 Financial insecurity: Secondary | ICD-10-CM

## 2023-12-06 DIAGNOSIS — E039 Hypothyroidism, unspecified: Secondary | ICD-10-CM

## 2023-12-06 NOTE — Telephone Encounter (Signed)
Requested Prescriptions  Pending Prescriptions Disp Refills   levothyroxine (SYNTHROID) 88 MCG tablet [Pharmacy Med Name: LEVOTHYROXINE 88 MCG TABLET] 90 tablet 1    Sig: TAKE 1 TABLET BY MOUTH EVERY DAY     Endocrinology:  Hypothyroid Agents Passed - 12/06/2023 10:50 AM      Passed - TSH in normal range and within 360 days    TSH  Date Value Ref Range Status  08/09/2023 1.660 0.450 - 4.500 uIU/mL Final         Passed - Valid encounter within last 12 months    Recent Outpatient Visits           3 weeks ago Type 2 diabetes mellitus with other diabetic kidney complication [facility])   [place] [facility] Jacky Kindle, FNP   1 month ago Dysuria   [facility] Alba Cory, MD   3 months ago Type 2 diabetes mellitus with other diabetic kidney complication [facility])   [facility] [name] T, FNP   10 months ago Hypertension associated with diabetes [facility])   [place] [facility] [name] T, FNP   1 year ago Type 2 diabetes mellitus with other diabetic kidney complication [facility])   [place] [facility] Jacky Kindle, FNP       Future Appointments             In 2 months Jacky Kindle, FNP [facility]

## 2023-12-06 NOTE — Progress Notes (Signed)
Pharmacy Medication Assistance Program Note    12/06/2023  Patient ID: Heather Burke, female   DOB: 01-28-1951, 72 y.o.   MRN: [id number]     12/06/2023  Outreach Medication One  Initial Outreach Date (Medication One) 10/02/2023  Manufacturer Medication One [employer] Trulicity  Dose of Trulicity 3mg /0.13ml  Type of Radiographer, therapeutic Assistance  Date Application Sent to Patient 10/05/2023  Application Items Requested Application;Proof of Income;Other  Date Application Sent to Prescriber 10/05/2023  Name of Prescriber [name]  Date Application Received From Patient 12/06/2023  Application Items Received From Patient Application;Proof of Income;Other  Date Application Received From Provider 10/18/2023  Date Application Submitted to Manufacturer 12/06/2023  Method Application Sent to Manufacturer Fax     Signature    Kristopher Glee [facility]  Office: [phone] Fax: [phone] Email: [name]

## 2023-12-08 ENCOUNTER — Encounter: Payer: Self-pay | Admitting: Family Medicine

## 2023-12-21 ENCOUNTER — Telehealth: Payer: Self-pay | Admitting: Pharmacy Technician

## 2023-12-21 DIAGNOSIS — Z5986 Financial insecurity: Secondary | ICD-10-CM

## 2023-12-21 NOTE — Progress Notes (Addendum)
 Pharmacy Medication Assistance Program Note    12/21/2023  Patient ID: Heather Burke, female   DOB: 1951-04-07, 73 y.o.   MRN: [id number]     12/06/2023 12/21/2023  Outreach Medication One  Initial Outreach Date (Medication One) 10/02/2023   Manufacturer Medication One [employer] Trulicity    Dose of Trulicity  3mg /0.40ml   Type of Radiographer, Therapeutic Assistance   Date Application Sent to Patient 10/05/2023   Application Items Requested Application;Proof of Income;Other   Date Application Sent to Prescriber 10/05/2023   Name of Prescriber Kelly Cedar   Date Application Received From Patient 12/06/2023   Application Items Received From Patient Application;Proof of Income;Other   Date Application Received From Provider 10/18/2023   Date Application Submitted to Manufacturer 12/06/2023   Method Application Sent to Manufacturer Fax   Patient Assistance Determination  Approved  Approval Start Date  12/20/2023  Approval End Date  12/18/2024   Care coordination call placed to [employer] in regard to Trulicity  application. Spoke to Nellie who informs patient is APPROVED 12/20/23-12/18/24. Medication will auto fill and ship to patient's home address on file based on last fill date in 2024. Patient may call [employer] at any time to check on the next refill and shipment dates by calling [phone]. Will send in basket to [facility] Pharm[facility] to notify patient of approval at next office visit.  Signature  Kate Marzette Sola [facility]  Office: [phone] Fax: [phone] Email: [email or web address]

## 2023-12-26 ENCOUNTER — Ambulatory Visit: Payer: PPO

## 2023-12-26 DIAGNOSIS — Z0000 Encounter for general adult medical examination without abnormal findings: Secondary | ICD-10-CM | POA: Diagnosis not present

## 2023-12-26 NOTE — Progress Notes (Cosign Needed)
 Subjective:   Heather Burke is a 73 y.o. female who presents for Medicare Annual (Subsequent) preventive examination.  Visit Complete: Virtual I connected with  Heather Burke on 12/26/23 by a video and audio enabled telemedicine application and verified that I am speaking with the correct person using two identifiers.  Patient Location: Home  Provider Location: Office/Clinic  I discussed the limitations of evaluation and management by telemedicine. The patient expressed understanding and agreed to proceed.  Vital Signs: Because this visit was a virtual/telehealth visit, some criteria may be missing or patient reported. Any vitals not documented were not able to be obtained and vitals that have been documented are patient reported.  Cardiac Risk Factors include: advanced age (>72men, >28 women);diabetes mellitus;hypertension;dyslipidemia     Objective:    There were no vitals filed for this visit. There is no height or weight on file to calculate BMI.     12/26/2023   10:53 AM 12/22/2022    1:11 PM 12/16/2021    9:06 AM 12/14/2020   11:18 AM 08/23/2018   10:38 AM 01/16/2018    8:36 AM 07/21/2017   10:32 AM  Advanced Directives  Does Patient Have a Medical Advance Directive? No No No No Yes No No  Type of Psychiatric Nurse of Healthcare Power of Attorney in Chart?     No - copy requested    Would patient like information on creating a medical advance directive? No - Patient declined No - Patient declined No - Patient declined No - Patient declined   No - Patient declined    Current Medications (verified) Outpatient Encounter Medications as of 12/26/2023  Medication Sig   ACCU-CHEK GUIDE TEST test strip USE 1 STIP TO CHECK BLOOD SUGAR IN THE MORNING, AT NOON, AND AT BEDTIME   Accu-Chek Softclix Lancets lancets 1 EACH BY DOES NOT APPLY ROUTE IN THE MORNING, AT NOON, AND AT BEDTIME   aspirin 81 MG tablet Take 81 mg by mouth daily.    Blood Glucose Monitoring Suppl DEVI 1 each by Does not apply route in the morning, at noon, and at bedtime. May substitute to any manufacturer covered by patient's insurance.   Cholecalciferol  125 MCG (5000 UT) TABS Take 1 tablet by mouth daily.   Continuous Glucose Sensor (FREESTYLE LIBRE 3 PLUS SENSOR) MISC Place 1 sensor on the skin every 15 days. Use to check glucose continuously   DILT-XR 240 MG 24 hr capsule TAKE 1 CAPSULE BY MOUTH EVERY DAY   Dulaglutide  (TRULICITY ) 1.5 MG/0.5ML SOPN Inject 1.5 mg into the skin once a week. Via [employer] patient assistance through Dec 2023   fenofibrate  (TRICOR ) 145 MG tablet Take 1 tablet (145 mg total) by mouth daily.   fluticasone  (FLONASE ) 50 MCG/ACT nasal spray SPRAY 2 SPRAYS INTO EACH NOSTRIL EVERY DAY   icosapent  Ethyl (VASCEPA ) 1 g capsule Take 2 g by mouth 2 (two) times daily.   insulin  glargine (LANTUS ) 100 UNIT/ML Solostar Pen Inject 40 Units into the skin at bedtime. Titrate by 2 units every 3 days for fasting morning blood sugar >150. (Patient taking differently: Inject 36 Units into the skin at bedtime. Titrate by 2 units every 3 days for fasting morning blood sugar >150.)   Insulin  Pen Needle (PEN NEEDLES 31GX5/16) 31G X 8 MM MISC 1 each by Does not apply route daily.   latanoprost  (XALATAN ) 0.005 % ophthalmic solution 1 drop at bedtime.  levothyroxine  (SYNTHROID ) 88 MCG tablet TAKE 1 TABLET BY MOUTH EVERY DAY   metoprolol  (TOPROL -XL) 200 MG 24 hr tablet TAKE 1 TABLET BY MOUTH EVERY DAY   potassium chloride  (KLOR-CON  M10) 10 MEQ tablet TAKE 1 TABLET BY MOUTH EVERY DAY   valsartan -hydrochlorothiazide  (DIOVAN -HCT) 320-12.5 MG tablet Take 1 tablet by mouth daily.   levocetirizine (XYZAL ) 5 MG tablet Take 5 mg by mouth as needed. (Patient not taking: Reported on 12/26/2023)   Misc Natural Products (T-RELIEF CBD+13 SL) Place 500 mg under the tongue daily. (Patient not taking: Reported on 12/26/2023)   Nutritional Supplements (QUINOA/KALE/HEMP PO)  Place 750 mg under the tongue. (Patient not taking: Reported on 12/26/2023)   No facility-administered encounter medications on file as of 12/26/2023.    Allergies (verified) Metformin  and related, Amlodipine , Atorvastatin, Lisinopril, Pravastatin, Icosapent  ethyl (epa ethyl ester) (fish), and Nifedipine  er   History: Past Medical History:  Diagnosis Date   Breast cancer (HCC) 06/08/2015   T1c, N0;  ER+,PR+, Her 2 neg (FISH neg) x2., SLN x 3 negative. Mammoprint: Low risk. Multifocal.Invasive mammary and lobular carcinoma.    Diabetes mellitus without complication (HCC)    GERD (gastroesophageal reflux disease)    Heart murmur 2016   newly diagnosed, slight murmur   Hyperlipidemia    Hypertension    Hypothyroidism    Lupus    PONV (postoperative nausea and vomiting)    with Hysterectomy   Thyroid  disease    Past Surgical History:  Procedure Laterality Date   ABDOMINAL HYSTERECTOMY     BREAST BIOPSY Right 2002   negative/ Dr. Dessa   BREAST BIOPSY Left 06-08-15   INVASIVE MAMMARY CARCINOMA WITH PARTIAL SOLID PATTERN.    BREAST BIOPSY Right 06/26/2018   FIBROADENOMATOUS CHANGES WITH STROMAL HYALINIZATION AND CALCIFICATIONS   COLONOSCOPY WITH PROPOFOL  N/A 09/13/2016   Procedure: COLONOSCOPY WITH PROPOFOL ;  Surgeon: Rogelia Copping, MD;  Location: [facility] END[facility];  Service: Endoscopy;  Laterality: N/A;   core biopsy Left 06/08/15   GUM SURGERY  2006   MASTECTOMY Left 2016   OOPHORECTOMY     SENTINEL NODE BIOPSY Left 08/18/2015   Procedure: SENTINEL NODE BIOPSY;  Surgeon: Reyes LELON Dessa, MD;  Location: [facility] ORS;  Service: General;  Laterality: Left;   SIMPLE MASTECTOMY WITH AXILLARY SENTINEL NODE BIOPSY Left 08/18/2015   Procedure: SIMPLE MASTECTOMY;  Surgeon: Reyes LELON Dessa, MD;  Location: [facility] ORS;  Service: General;  Laterality: Left;   TONSILLECTOMY     Family History  Problem Relation Age of Onset   Cancer Father        prostate   Thyroid  disease Daughter    Heart attack  Sister    Breast cancer Cousin    Breast cancer Cousin    Breast cancer Other    Social History   Socioeconomic History   Marital status: Widowed    Spouse name: Not on file   Number of children: 2   Years of education: H/S   Highest education level: 12th grade  Occupational History   Occupation: Part-Time    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  Tobacco Use   Smoking status: Every Day    Current packs/day: 2.00    Average packs/day: 2.0 packs/day for 49.0 years (98.0 ttl pk-yrs)    Types: Cigarettes   Smokeless tobacco: Never  Vaping Use   Vaping status: Never Used  Substance and Sexual Activity   Alcohol use: Yes    Alcohol/week: 14.0 - 28.0 standard drinks of  alcohol    Types: 14 - 28 Cans of beer per week    Comment: 2-3 beers a day - declined cutting back anymore   Drug use: No   Sexual activity: Not on file  Other Topics Concern   Not on file  Social History Narrative   Not on file   Social Drivers of Health   Financial Resource Strain: Low Risk  (12/26/2023)   Overall Financial Resource Strain (CARDIA)    Difficulty of Paying Living Expenses: Not hard at all  Food Insecurity: No Food Insecurity (12/26/2023)   Hunger Vital Sign    Worried About Running Out of Food in the Last Year: Never true    Ran Out of Food in the Last Year: Never true  Transportation Needs: No Transportation Needs (12/26/2023)   PRAPARE - Administrator, Civil Service (Medical): No    Lack of Transportation (Non-Medical): No  Physical Activity: Insufficiently Active (12/26/2023)   Exercise Vital Sign    Days of Exercise per Week: 4 days    Minutes of Exercise per Session: 30 min  Stress: No Stress Concern Present (12/26/2023)   [employer] of Occupational Health - Occupational Stress Questionnaire    Feeling of Stress : Not at all  Social Connections: Moderately Isolated (12/26/2023)   Social Connection and Isolation Panel [NHANES]    Frequency of Communication with  Friends and Family: More than three times a week    Frequency of Social Gatherings with Friends and Family: More than three times a week    Attends Religious Services: 1 to 4 times per year    Active Member of [employer] or Organizations: No    Attends Banker Meetings: Never    Marital Status: Widowed    Tobacco Counseling Ready to quit: Not Answered Counseling given: Not Answered   Clinical Intake:  Pre-visit preparation completed: Yes  Pain : No/denies pain     BMI - recorded: 26.7 Nutritional Status: BMI 25 -29 Overweight Nutritional Risks: None Diabetes: Yes CBG done?: No Did pt. bring in CBG monitor from home?: No  How often do you need to have someone help you when you read instructions, pamphlets, or other written materials from your doctor or pharmacy?: 1 - Never  Interpreter Needed?: No  Information entered by :: JHONNIE DAS, LPN   Activities of Daily Living    12/26/2023   10:54 AM 12/22/2023    9:18 AM  In your present state of health, do you have any difficulty performing the following activities:  Hearing? 0 0  Vision? 0 0  Difficulty concentrating or making decisions? 0 0  Walking or climbing stairs? 0 0  Dressing or bathing? 0 0  Doing errands, shopping? 0 0  Preparing Food and eating ? N N  Using the Toilet? N N  In the past six months, have you accidently leaked urine? N N  Do you have problems with loss of bowel control? N N  Managing your Medications? N N  Managing your Finances? N N  Housekeeping or managing your Housekeeping? N N    Patient Care Team: Emilio Kelly DASEN, FNP as PCP - General (Family Medicine) Carolee Manus DASEN., MD as Consulting Physician (Ophthalmology) Alana, Sharyle LABOR, RPH-CPP as Pharmacist  Indicate any recent Medical Services you may have received from other than [facility] providers in the past year (date may be approximate).     Assessment:   This is a routine wellness examination for  [facility].  Hearing/Vision screen Hearing Screening - Comments:: NO AIDS Vision Screening - Comments:: WEARS GLASSES ALL THE TIME- DR.BELL   Goals Addressed             This Visit's Progress    DIET - INCREASE WATER INTAKE         Depression Screen    12/26/2023   10:50 AM 11/09/2023   11:22 AM 10/11/2023   12:44 PM 08/09/2023   10:06 AM 12/22/2022    1:09 PM 07/14/2022    9:50 AM 12/16/2021    9:04 AM  PHQ 2/9 Scores  PHQ - 2 Score 0 0 0 2 0 0 0  PHQ- 9 Score 0 2 0 5 0      Fall Risk    12/26/2023   10:53 AM 12/22/2023    9:18 AM 11/09/2023   11:22 AM 10/11/2023   12:44 PM 08/09/2023   10:06 AM  Fall Risk   Falls in the past year? 1 1 0 0 0  Number falls in past yr: 0 0 0 0   Injury with Fall? 1 1 0 0 0  Risk for fall due to :    No Fall Risks No Fall Risks  Follow up Falls evaluation completed;Falls prevention discussed   Falls prevention discussed;Education provided;Falls evaluation completed Falls evaluation completed    MEDICARE RISK AT HOME: Medicare Risk at Home Any stairs in or around the home?: Yes If so, are there any without handrails?: No Home free of loose throw rugs in walkways, pet beds, electrical cords, etc?: Yes Adequate lighting in your home to reduce risk of falls?: Yes Life alert?: No Use of a cane, walker or w/c?: No Grab bars in the bathroom?: Yes (IN THE SHOWER) Shower chair or bench in shower?: No Elevated toilet seat or a handicapped toilet?: No  TIMED UP AND GO:  Was the test performed?  No    Cognitive Function:        12/26/2023   10:55 AM 12/22/2022    1:14 PM 12/04/2019    9:17 AM  6CIT Screen  What Year? 0 points 0 points 0 points  What month? 0 points 0 points 0 points  What time? 0 points 0 points 0 points  Count back from 20 0 points 0 points 0 points  Months in reverse 0 points 0 points 0 points  Repeat phrase 2 points 2 points 2 points  Total Score 2 points 2 points 2 points    Immunizations Immunization History   Administered Date(s) Administered   Fluad Quad(high Dose 65+) 10/15/2020   Influenza Inj Mdck Quad Pf 10/12/2022   Influenza Split 10/21/2010, 10/13/2012, 10/19/2023   Influenza, High Dose Seasonal PF 10/04/2018, 09/28/2021   Influenza,inj,Quad PF,6+ Mos 10/02/2014, 09/09/2015   Influenza-Unspecified 09/21/2016, 10/03/2017, 10/12/2022   PFIZER Comirnaty(Gray Top)Covid-19 Tri-Sucrose Vaccine 07/13/2021   PFIZER(Purple Top)SARS-COV-2 Vaccination 02/18/2020, 02/23/2020, 09/10/2020   Pfizer Covid-19 Vaccine Bivalent Booster 13yrs & up 12/28/2021   Pfizer Fall 2023 Covid-19 Vaccine 76yrs thru 15yrs. 12/28/2021   Pneumococcal Conjugate-13 07/21/2017   Pneumococcal Polysaccharide-23 01/22/2013, 08/23/2018   RSV,unspecified 12/02/2022   Respiratory Syncytial Virus Vaccine,Recomb Aduvanted(Arexvy) 12/02/2022   Tdap 10/21/2010, 01/14/2022   Zoster Recombinant(Shingrix) 04/27/2020, 01/27/2021   Zoster, Live 10/03/2013    TDAP status: Up to date  Flu Vaccine status: Up to date  Pneumococcal vaccine status: Up to date  Covid-19 vaccine status: Completed vaccines  Qualifies for Shingles Vaccine? Yes   Zostavax completed Yes   Shingrix Completed?: Yes  Screening Tests Health Maintenance  Topic Date Due   FOOT EXAM  06/15/2022   OPHTHALMOLOGY EXAM  06/28/2022   MAMMOGRAM  05/04/2023   COVID-19 Vaccine (6 - 2024-25 season) 08/20/2023   HEMOGLOBIN A1C  05/08/2024   Diabetic kidney evaluation - Urine ACR  05/10/2024   Lung Cancer Screening  05/29/2024   Diabetic kidney evaluation - eGFR measurement  08/08/2024   Medicare Annual Wellness (AWV)  12/25/2024   Colonoscopy  09/13/2026   DTaP/Tdap/Td (3 - Td or Tdap) 01/15/2032   Pneumonia Vaccine 72+ Years old  Completed   INFLUENZA VACCINE  Completed   DEXA SCAN  Completed   Hepatitis C Screening  Completed   Zoster Vaccines- Shingrix  Completed   HPV VACCINES  Aged Out    Health Maintenance  Health Maintenance Due  Topic Date Due    FOOT EXAM  06/15/2022   OPHTHALMOLOGY EXAM  06/28/2022   MAMMOGRAM  05/04/2023   COVID-19 Vaccine (6 - 2024-25 season) 08/20/2023    Colorectal cancer screening: Type of screening: Colonoscopy. Completed 09/13/16. Repeat every 10 years  Mammogram status: Completed 08/08/23. Repeat every year  Bone Density status: Completed 01/07/16. Results reflect: Bone density results: NORMAL. Repeat every 5 years.- DECLINED REFERRAL  Lung Cancer Screening: (Low Dose CT Chest recommended if Age 45-80 years, 20 pack-year currently smoking OR have quit w/in 15years.) does qualify.   Lung Cancer Screening Referral: CT SCAN DONE 05/30/23  Additional Screening:  Hepatitis C Screening: does qualify; Completed 02/05/13  Vision Screening: Recommended annual ophthalmology exams for early detection of glaucoma and other disorders of the eye. Is the patient up to date with their annual eye exam?  Yes  Who is the provider or what is the name of the office in which the patient attends annual eye exams? DR.BELL If pt is not established with a provider, would they like to be referred to a provider to establish care? No .   Dental Screening: Recommended annual dental exams for proper oral hygiene  Diabetic Foot Exam: Diabetic Foot Exam: Overdue, Pt has been advised about the importance in completing this exam. Pt is scheduled for diabetic foot exam on 00.  Community Resource Referral / Chronic Care Management: CRR required this visit?  No   CCM required this visit?  No     Plan:     I have personally reviewed and noted the following in the patient's chart:   Medical and social history Use of alcohol, tobacco or illicit drugs  Current medications and supplements including opioid prescriptions. Patient is not currently taking opioid prescriptions. Functional ability and status Nutritional status Physical activity Advanced directives List of other physicians Hospitalizations, surgeries, and ER visits in  previous 12 months Vitals Screenings to include cognitive, depression, and falls Referrals and appointments  In addition, I have reviewed and discussed with patient certain preventive protocols, quality metrics, and best practice recommendations. A written personalized care plan for preventive services as well as general preventive health recommendations were provided to patient.     Jhonnie GORMAN Das, LPN   07/20/7973   After Visit Summary: (MyChart) Due to this being a telephonic visit, the after visit summary with patients personalized plan was offered to patient via MyChart   Nurse Notes: NONE

## 2023-12-26 NOTE — Patient Instructions (Addendum)
 Heather Burke , Thank you for taking time to come for your Medicare Wellness Visit. I appreciate your ongoing commitment to your health goals. Please review the following plan we discussed and let me know if I can assist you in the future.   Referrals/Orders/Follow-Ups/Clinician Recommendations: NONE  This is a list of the screening recommended for you and due dates:  Health Maintenance  Topic Date Due   Complete foot exam   06/15/2022   Eye exam for diabetics  06/28/2022   Mammogram  05/04/2023   COVID-19 Vaccine (6 - 2024-25 season) 08/20/2023   Hemoglobin A1C  05/08/2024   Yearly kidney health urinalysis for diabetes  05/10/2024   Screening for Lung Cancer  05/29/2024   Yearly kidney function blood test for diabetes  08/08/2024   Medicare Annual Wellness Visit  12/25/2024   Colon Cancer Screening  09/13/2026   DTaP/Tdap/Td vaccine (3 - Td or Tdap) 01/15/2032   Pneumonia Vaccine  Completed   Flu Shot  Completed   DEXA scan (bone density measurement)  Completed   Hepatitis C Screening  Completed   Zoster (Shingles) Vaccine  Completed   HPV Vaccine  Aged Out    Advanced directives: (ACP Link)Information on Advanced Care Planning can be found at [place]  Secretary of State Advance Health Care Directives Advance Health Care Directives ([email or web address]   Next Medicare Annual Wellness Visit scheduled for next year: Yes   12/31/24 @ 8:50 AM BY VIDEO

## 2023-12-29 ENCOUNTER — Other Ambulatory Visit: Payer: Self-pay | Admitting: Pharmacist

## 2023-12-29 NOTE — Progress Notes (Signed)
 12/29/2023 Name: Heather Burke MRN: [id number] DOB: 1951/05/13  Chief Complaint  Patient presents with   Medication Management   Medication Assistance    Heather Burke is a 73 y.o. year old female who presented for a telephone visit.   They were referred to the pharmacist by their PCP for assistance in managing diabetes and medication access.      Subjective:   Care Team: Primary Care Provider: Wellington Curtis DELENA, FNP; Next Scheduled Visit: 02/09/2024    Medication Access/Adherence  Current Pharmacy:  [id number] [address], [facility] - [address] [place] [address] Phone: [phone] Fax: [phone]  [facility] - [place], [place] - [address] [place] [address] Phone: [phone] Fax: [phone]   Patient reports affordability concerns with their medications: No  Patient reports access/transportation concerns to their pharmacy: No  Patient reports adherence concerns with their medications:  No     Uses weekly pillbox     Diabetes:   Current medications:  - Trulicity  1.5 mg weekly on Saturdays Note with upcoming supply from patient assistance program, planning to increase Trulicity  dose to 3 mg weekly as prescribed by previous PCP Kelly Cedar - Lantus  36 units each morning   Previous therapies tried: metformin  (unable to tolerate due to GI side effects)   Reports home blood sugar readings have improved. Recent morning fasting ranging 74-111   Reports picked up prescription for Freestyle Libre 3 Plus sensors, but has not yet started using  Reports eating better now; appetite improved    Patient denies hypoglycemic s/sx including dizziness, shakiness, sweating.  - Note patient carries glucose tablets    Current physical activity: walks for 20-30 minutes at mall x 2-4 days/week and active with grandkids   Current medication access support: Enrolled in [employer]  patient assistance program for Trulicity  through 12/18/2024 - Reports has 1 pen remaining    Hypertension:   Current medications:  - Diltiazem  XR 240 mg daily - metoprolol  ER 200 mg nightly - valsartan -HCTZ 320-12.5 mg daily   Patient has a validated, automated, upper arm home BP cuff Current blood pressure readings readings: last checked:  1/7: 140/71, HR 69  Admits to adding salt to her food when cooks, but working on limiting sodium from packaged foods   Current physical activity: walks for 20-30 minutes at mall x 2-4 days/week and active with grandkids     Hyperlipidemia/ASCVD Risk Reduction   Current lipid lowering medications: fenofibrate  145 mg daily & Vascepa  2 grams twice daily Medications tried in the past: rosuvastatin , lovastatin    Antiplatelet regimen: aspirin 81 mg daily   Current physical activity: walks for 20-30 minutes at mall x 2-4 days/week and active with grandkids     Tobacco Use: - Currently smoking ~2 packs of cigarettes/day - Denies interest in quitting at this time   Objective:  Lab Results  Component Value Date   HGBA1C 10.2 (A) 11/09/2023    Lab Results  Component Value Date   CREATININE 0.71 08/09/2023   BUN 8 08/09/2023   NA 128 (L) 08/09/2023   K 4.1 08/09/2023   CL 86 (L) 08/09/2023   CO2 25 08/09/2023    Lab Results  Component Value Date   CHOL 163 08/09/2023   HDL 25 (L) 08/09/2023   LDLCALC 78 08/09/2023   TRIG 371 (H) 08/09/2023   CHOLHDL 6.5 (H) 08/09/2023   BP Readings from Last 3 Encounters:  11/09/23 ROLLEN)  147/80  10/11/23 128/70  08/09/23 (!) 165/77   Pulse Readings from Last 3 Encounters:  11/09/23 75  10/11/23 83  08/09/23 73     Medications Reviewed Today     Reviewed by Alana Sharyle LABOR, RPH-CPP (Pharmacist) on 12/29/23 at 1220  Med List Status: <None>   Medication Order Taking? Sig Documenting Provider Last Dose Status Informant  ACCU-CHEK GUIDE TEST test strip [id number]  USE 1 STIP TO CHECK  BLOOD SUGAR IN THE MORNING, AT NOON, AND AT BEDTIME Emilio Kelly DASEN, FNP  Active   Accu-Chek Softclix Lancets lancets [id number]  1 EACH BY DOES NOT APPLY ROUTE IN THE MORNING, AT NOON, AND AT BEDTIME Emilio Kelly DASEN, FNP  Active   aspirin 81 MG tablet [id number]  Take 81 mg by mouth daily. [provider]  Active   Blood Glucose Monitoring Suppl DEVI [id number]  1 each by Does not apply route in the morning, at noon, and at bedtime. May substitute to any manufacturer covered by patient's insurance. Emilio Kelly DASEN, FNP  Active   Cholecalciferol  125 MCG (5000 UT) TABS [id number]  Take 1 tablet by mouth daily. [provider]  Active   Continuous Glucose Sensor (FREESTYLE LIBRE 3 PLUS SENSOR) MISC [id number]  Place 1 sensor on the skin every 15 days. Use to check glucose continuously Emilio Kelly DASEN, FNP  Active   DILT-XR 240 MG 24 hr capsule [id number] Yes TAKE 1 CAPSULE BY MOUTH EVERY DAY Emilio Kelly DASEN, FNP Taking Active   Dulaglutide  (TRULICITY ) 1.5 MG/0.5ML SOPN [id number] Yes Inject 1.5 mg into the skin once a week. Via [employer] patient assistance through Dec 2023 Myrla, Jon HERO, MD Taking Active   fenofibrate  (TRICOR ) 145 MG tablet [id number] Yes Take 1 tablet (145 mg total) by mouth daily. Emilio Kelly DASEN, FNP Taking Active   fluticasone  (FLONASE ) 50 MCG/ACT nasal spray [id number]  SPRAY 2 SPRAYS INTO EACH NOSTRIL EVERY DAY Emilio Kelly DASEN, FNP  Active   icosapent  Ethyl (VASCEPA ) 1 g capsule [id number] Yes Take 2 g by mouth 2 (two) times daily. [provider] Taking Active   insulin  glargine (LANTUS ) 100 UNIT/ML Solostar Pen [id number] Yes Inject 40 Units into the skin at bedtime. Titrate by 2 units every 3 days for fasting morning blood sugar >150.  Patient taking differently: Inject 36 Units into the skin at bedtime. Titrate by 2 units every 3 days for fasting morning blood sugar >150.   Emilio Kelly DASEN, FNP Taking Active   Insulin  Pen Needle (PEN NEEDLES 31GX5/16) 31G X 8  MM MISC [id number]  1 each by Does not apply route daily. Emilio Kelly T, FNP  Active   latanoprost  (XALATAN ) 0.005 % ophthalmic solution [id number]  1 drop at bedtime. [provider]  Active   levocetirizine (XYZAL ) 5 MG tablet [id number]  Take 5 mg by mouth as needed.  Patient not taking: Reported on 12/26/2023   [provider]  Active   levothyroxine  (SYNTHROID ) 88 MCG tablet [id number]  TAKE 1 TABLET BY MOUTH EVERY DAY Emilio Kelly T, FNP  Active   metoprolol  (TOPROL -XL) 200 MG 24 hr tablet [id number] Yes TAKE 1 TABLET BY MOUTH EVERY DAY Emilio Kelly DASEN, FNP Taking Active   Misc Natural Products (T-RELIEF CBD+13 SL) [id number]  Place 500 mg under the tongue daily.  Patient not taking: Reported on 12/26/2023   [provider]  Active Self  Nutritional Supplements (QUINOA/KALE/HEMP PO) [id number]  Place 750 mg under the tongue.  Patient not  taking: Reported on 12/26/2023   [provider]  Active   potassium chloride  (KLOR-CON  M10) 10 MEQ tablet [id number]  TAKE 1 TABLET BY MOUTH EVERY DAY Emilio Marseille T, FNP  Active   valsartan -hydrochlorothiazide  (DIOVAN -HCT) 320-12.5 MG tablet [id number] Yes Take 1 tablet by mouth daily. Emilio Marseille DASEN, FNP Taking Active               Assessment/Plan:   Diabetes: - Currently uncontrolled - Reviewed long term cardiovascular and renal outcomes of uncontrolled blood sugar - Reviewed goal A1c, goal fasting, and goal 2 hour post prandial glucose - Reviewed dietary modifications including importance of having regular well-balanced meals and snacks throughout the day, while controlling carbohydrate portion sizes Discussed having daily bedtime balanced snack to help prevent low blood sugar readings - Have counseled patient on s/s of low blood sugar and how to treat lows - Today address patient's questions regarding Freestyle Libre CGM. Have provided patient with phone number for MyFreeStyle Support at [phone]. Their customer  support is available 7 days a week 8 am to 8 pm - Recommend to use Freestyle Libre 3 Plus CGM to monitor blood sugar/as feedback on dietary choices - Recommend to check glucose with fingerstick check when needed for symptoms and as back up to CGM. Patient to contact office if needed for readings outside of established parameters or symptoms - Patient follows up with [employer] patient assistance program today and sends message advising program will ship next supply of Trulicity  to her by 1/14 and dosage will be for increased Trulicity  3 mg weekly strength - Collaborate with PCP regarding patient's medication management to reduce risk of hypoglycemia As discussed with provider, advise patient that she may reduce Lantus  dose by 2 units every 7 days if fasting blood sugar readings consistently <120  Patient writes down this information and verbalizes understanding via teachback   Hypertension: - Have reviewed long term cardiovascular and renal outcomes of uncontrolled blood pressure - Recommend patient to continue to work on reducing salt/sodium intake - Recommend to continue to monitor home blood pressure, keep log of results and have this record to review at upcoming medical appointments. Patient to contact provider office sooner if needed for readings outside of established parameters or symptoms     Hyperlipidemia/ASCVD Risk Reduction: - Currently uncontrolled.  - Have reviewed long term complications of uncontrolled cholesterol - Recommend to take fenofibrate  and Vascepa  as directed. Plan to reassess following next lipid panel lab work     Tobacco Abuse - Have provided motivational interviewing to assess tobacco use - Have provided information on 1 800 QUIT NOW support program     Follow Up Plan: Clinical Pharmacist will follow up with patient by telephone on 04/24/2024 at 10:00 AM    Sharyle Sia, PharmD, [facility] [phone]

## 2023-12-29 NOTE — Patient Instructions (Signed)
 Goals Addressed             This Visit's Progress    Pharmacy Goals       Please copy and paste the following web address into your web browser for the [employer] 3 Videos:   [email or web address]   As we discussed, you will need to download the Freestyle Libre 3 App to your smart phone in order to use your phone in place of the reader device   Please remember that you will need to apply a new Freestyle Libre 3 Plus sensor every 15 days   Also, you can reach out to the MyFreeStyle Support at [phone]. Their customer support is available 7 days a week 8 am to 8 pm.   Please review the video above and call me, your provider or the pharmacy with any questions before starting to use this device.  The goal A1c is less than 7%. This is the best way to reduce the risk of the long term complications of diabetes, including heart disease, kidney disease, eye disease, strokes, and nerve damage. An A1c of less than 7% corresponds with fasting sugars less than 130 and 2 hour after meal sugars less than 180. Please check your blood sugar twice daily.   Check your blood pressure once daily, and any time you have concerning symptoms like headache, chest pain, dizziness, shortness of breath, or vision changes.   Our goal is less than 130/80.  To appropriately check your blood pressure, make sure you do the following:  1) Avoid caffeine, exercise, or tobacco products for 30 minutes before checking. Empty your bladder. 2) Sit with your back supported in a flat-backed chair. Rest your arm on something flat (arm of the chair, table, etc). 3) Sit still with your feet flat on the floor, resting, for at least 5 minutes.  4) Check your blood pressure. Take 1-2 readings.  5) Write down these readings and bring with you to any provider appointments.  Bring your home blood pressure machine with you to a provider's office for accuracy comparison at least once a year.    Make sure you take your blood pressure medications before you come to any office visit, even if you were asked to fast for labs.  Please consider following up with the [place] Quitline for their help with quitting smoking.  The [place] Quitline phone number is: [phone]  Estelle Grumbles, PharmD, [facility] [phone]

## 2024-01-11 DIAGNOSIS — H40153 Residual stage of open-angle glaucoma, bilateral: Secondary | ICD-10-CM | POA: Diagnosis not present

## 2024-01-19 ENCOUNTER — Telehealth: Payer: Self-pay | Admitting: Family Medicine

## 2024-01-19 NOTE — Telephone Encounter (Signed)
[facility] icos[facility]ent Ethyl (VASCEPA[facility] 1 g capsule   Please advise

## 2024-01-22 ENCOUNTER — Telehealth: Payer: Self-pay | Admitting: Family Medicine

## 2024-01-22 NOTE — Telephone Encounter (Signed)
[facility] icosapent Ethyl (VASCEPA) 1 g capsule  Please advise

## 2024-01-26 ENCOUNTER — Other Ambulatory Visit: Payer: Self-pay | Admitting: Family Medicine

## 2024-01-26 ENCOUNTER — Other Ambulatory Visit: Payer: Self-pay | Admitting: Pharmacist

## 2024-01-26 ENCOUNTER — Encounter: Payer: Self-pay | Admitting: Family Medicine

## 2024-01-26 DIAGNOSIS — E1129 Type 2 diabetes mellitus with other diabetic kidney complication: Secondary | ICD-10-CM

## 2024-01-26 MED ORDER — TRULICITY 1.5 MG/0.5ML ~~LOC~~ SOAJ: 1.5000 mg | SUBCUTANEOUS | 0 refills | Status: DC

## 2024-01-26 NOTE — Progress Notes (Signed)
 01/26/2024 Name: Heather Burke MRN: [id number] DOB: [birth year]  Chief Complaint  Patient presents with   Medication Assistance   Medication Management    Heather Burke is a 73 y.o. year old female who was referred to the pharmacist by their PCP for assistance in managing diabetes and medication access.    Receive a voicemail from patient today requesting a call back regarding recent abdominal discomfort on and off, gas and concerned about limited appetite. Note patient increased to Trulicity  3 mg weekly dose ~3 weeks ago    Subjective:   Care Team: Primary Care Provider: Wellington Curtis DELENA, FNP; Next Scheduled Visit: 02/09/2024    Medication Access/Adherence  [facility]acy[facility]  [id number] [address], [facility] - [address] [place] [address] Phone: [phone] Fax: [phone]  [facility] - [place], [place] - [address] [place] [address] Phone: [phone] Fax: [phone]   Patient reports affordability concerns with their medications: No  Patient reports access/transportation concerns to their pharmacy: No  Patient reports adherence concerns with their medications:  No     Uses weekly pillbox     Diabetes:   Current medications:  - Trulicity  3 mg weekly on Saturdays Reports increased to current dose ~3 weeks ago with new supply from patient assistance/as prescribed by previous PCP Kelly Cedar  - Lantus  28 units each morning (reports self titrated down by 2 units every 7 days when fasting blood sugar readings consistently <120)   Previous therapies tried: metformin  (unable to tolerate due to GI side effects)    Reports started using Freestyle Libre 3 Plus sensors. Review data per Libre[employer] today  Date of Download: 01/26/24 % Time CGM is active: 97% Average Glucose: 132 mg/dL Glucose Management Indicator: 6.5%  Glucose Variability: 26.6 (goal <36%) Time in Goal:   - Time in range 70-180: 90% - Time above range: 10% - Time below range: 0%   Reports recently having abdominal discomfort on and off, gas and concerned about limited appetite. Note patient increased to Trulicity  3 mg weekly dose ~3 weeks ago    Patient denies hypoglycemic s/sx including dizziness, shakiness, sweating.  - Note patient carries glucose tablets    Current physical activity: walks for 20-30 minutes at mall x 2-4 days/week and active with grandkids   Current medication access support: Enrolled in [employer] patient assistance program for Trulicity  through 12/18/2024      Objective:  Lab Results  Component Value Date   HGBA1C 10.2 (A) 11/09/2023    Lab Results  Component Value Date   CREATININE 0.71 08/09/2023   BUN 8 08/09/2023   NA 128 (L) 08/09/2023   K 4.1 08/09/2023   CL 86 (L) 08/09/2023   CO2 25 08/09/2023    Lab Results  Component Value Date   CHOL 163 08/09/2023   HDL 25 (L) 08/09/2023   LDLCALC 78 08/09/2023   TRIG 371 (H) 08/09/2023   CHOLHDL 6.5 (H) 08/09/2023    Medications Reviewed Today     Reviewed by Alana Sharyle DELENA, RPH-CPP (Pharmacist) on 01/26/24 at 1210  Med List Status: <None>   Medication Order Taking? Sig Documenting Provider Last Dose Status Informant  ACCU-CHEK GUIDE TEST test strip [id number]  USE 1 STIP TO CHECK BLOOD SUGAR IN THE MORNING, AT NOON, AND AT BEDTIME Cedar Kelly DASEN, FNP  Active   Accu-Chek Softclix Lancets lancets [id number]  1 EACH BY DOES NOT APPLY ROUTE IN THE  MORNING, AT NOON, AND AT BEDTIME Emilio Kelly DASEN, FNP  Active   aspirin 81 MG tablet [id number]  Take 81 mg by mouth daily. [provider]  Active   Blood Glucose Monitoring Suppl DEVI [id number]  1 each by Does not apply route in the morning, at noon, and at bedtime. May substitute to any manufacturer covered by patient's insurance. Emilio Kelly DASEN, FNP  Active   Cholecalciferol  125 MCG (5000 UT) TABS [id number]  Take 1 tablet by mouth daily. [provider]  Active   Continuous Glucose Sensor (FREESTYLE LIBRE 3 PLUS SENSOR) MISC [id number]  Place 1 sensor on the skin every 15 days. Use to check glucose continuously Emilio Kelly DASEN, FNP  Active   DILT-XR 240 MG 24 hr capsule [id number]  TAKE 1 CAPSULE BY MOUTH EVERY DAY Emilio Kelly T, FNP  Active   Dulaglutide  (TRULICITY ) 1.5 MG/0.5ML SOPN [id number] Yes Inject 1.5 mg into the skin once a week. Via [employer] patient assistance through Dec 2023  Patient taking differently: Inject 3 mg into the skin once a week. Via [employer] patient assistance through Dec 2023   Myrla, Jon HERO, MD Taking Active   fenofibrate  (TRICOR ) 145 MG tablet [id number]  Take 1 tablet (145 mg total) by mouth daily. Emilio Kelly T, FNP  Active   fluticasone  (FLONASE ) 50 MCG/ACT nasal spray [id number]  SPRAY 2 SPRAYS INTO EACH NOSTRIL EVERY DAY Emilio Kelly DASEN, FNP  Active   icosapent  Ethyl (VASCEPA ) 1 g capsule [id number]  Take 2 g by mouth 2 (two) times daily. [provider]  Active   insulin  glargine (LANTUS ) 100 UNIT/ML Solostar Pen [id number]  Inject 40 Units into the skin at bedtime. Titrate by 2 units every 3 days for fasting morning blood sugar >150.  Patient taking differently: Inject 28 Units into the skin at bedtime. Titrate by 2 units every 3 days for fasting morning blood sugar >150.   Emilio Kelly DASEN, FNP  Active   Insulin  Pen Needle (PEN NEEDLES 31GX5/16) 31G X 8 MM MISC [id number]  1 each by Does not apply route daily. Emilio Kelly DASEN, FNP  Active   latanoprost  (XALATAN ) 0.005 % ophthalmic solution [id number]  1 drop at bedtime. [provider]  Active   levocetirizine (XYZAL ) 5 MG tablet [id number]  Take 5 mg by mouth as needed.  Patient not taking: Reported on 12/26/2023   [provider]  Active   levothyroxine  (SYNTHROID ) 88 MCG tablet [id number]  TAKE 1 TABLET BY MOUTH EVERY DAY Emilio Kelly T, FNP  Active   metoprolol  (TOPROL -XL) 200 MG 24 hr tablet [id number]  TAKE 1 TABLET  BY MOUTH EVERY DAY Emilio Kelly DASEN, FNP  Active   Misc Natural Products (T-RELIEF CBD+13 SL) [id number]  Place 500 mg under the tongue daily.  Patient not taking: Reported on 12/26/2023   [provider]  Active Self  Nutritional Supplements (QUINOA/KALE/HEMP PO) [id number]  Place 750 mg under the tongue.  Patient not taking: Reported on 12/26/2023   [provider]  Active   potassium chloride  (KLOR-CON  M10) 10 MEQ tablet [id number]  TAKE 1 TABLET BY MOUTH EVERY DAY Emilio Kelly DASEN, FNP  Active   valsartan -hydrochlorothiazide  (DIOVAN -HCT) 320-12.5 MG tablet [id number]  Take 1 tablet by mouth daily. Emilio Kelly DASEN, FNP  Active               Assessment/Plan:   Patient plans to send PCP Curtis Boom a MyChart message today regarding recent abdominal  discomfort on and off, gas and concerned about limited appetite. Note patient increased to Trulicity  3 mg weekly dose ~3 weeks ago  Will collaborate with PCP today  Diabetes: - Currently uncontrolled - Reviewed goal A1c, goal fasting, and goal 2 hour post prandial glucose - Have reviewed dietary modifications including importance of having regular well-balanced meals and snacks throughout the day, while controlling carbohydrate portion sizes - Have counseled patient on s/s of low blood sugar and how to treat lows - Recommend to use Freestyle Libre 3 Plus CGM to monitor blood sugar/as feedback on dietary choices - Recommend to check glucose with fingerstick check when needed for symptoms and as back up to CGM. Patient to contact office if needed for readings outside of established parameters or symptoms     Follow Up Plan: Clinical Pharmacist will follow up with patient by telephone on 04/24/2024 at 10:00 AM    Sharyle Sia, PharmD, [facility] [phone]

## 2024-01-26 NOTE — Patient Instructions (Signed)
 Goals Addressed             This Visit's Progress    Pharmacy Goals       Please remember that you will need to apply a new Freestyle Libre 3 Plus sensor every 15 days   Also, you can reach out to the My[employer] Support at [phone]. Their customer support is available 7 days a week 8 am to 8 pm.   Please review the video above and call me, your provider or the pharmacy with any questions before starting to use this device.  The goal A1c is less than 7%. This is the best way to reduce the risk of the long term complications of diabetes, including heart disease, kidney disease, eye disease, strokes, and nerve damage. An A1c of less than 7% corresponds with fasting sugars less than 130 and 2 hour after meal sugars less than 180. Please check your blood sugar twice daily.   Check your blood pressure once daily, and any time you have concerning symptoms like headache, chest pain, dizziness, shortness of breath, or vision changes.   Our goal is less than 130/80.  To appropriately check your blood pressure, make sure you do the following:  1) Avoid caffeine, exercise, or tobacco products for 30 minutes before checking. Empty your bladder. 2) Sit with your back supported in a flat-backed chair. Rest your arm on something flat (arm of the chair, table, etc). 3) Sit still with your feet flat on the floor, resting, for at least 5 minutes.  4) Check your blood pressure. Take 1-2 readings.  5) Write down these readings and bring with you to any provider appointments.  Bring your home blood pressure machine with you to a provider's office for accuracy comparison at least once a year.   Make sure you take your blood pressure medications before you come to any office visit, even if you were asked to fast for labs.  Please consider following up with the [place] Quitline for their help with quitting smoking.  The [place] Quitline phone number is: [phone]   Sharyle Sia, PharmD, [facility] [phone]

## 2024-01-29 ENCOUNTER — Other Ambulatory Visit: Payer: Self-pay | Admitting: Pharmacist

## 2024-01-29 ENCOUNTER — Telehealth: Payer: Self-pay | Admitting: Pharmacist

## 2024-01-29 NOTE — Progress Notes (Signed)
Patient requesting renewal of her Vascepa prescription, as previous prescription [name] is expired.  Would you mind [facility]?  Thank you!  Gentry Fitz

## 2024-01-29 NOTE — Progress Notes (Signed)
   01/29/2024  Patient ID: Heather Burke, female   DOB: 05/24/1951, 73 y.o.   MRN: [id number]  Submit prior authorization request to patient's [employer] coverage via covermymeds on behalf of patient. PA approved through 01/29/2024 to 01/28/2025 - Provide update to PCP - Will ask provider to resubmit Rx [facility]  When I follow up with patient, she also requests renewal of her Vascepa  prescription. - Send request to clinical team.  Arthur Lash, PharmD, [facility] [phone]

## 2024-01-30 ENCOUNTER — Other Ambulatory Visit: Payer: Self-pay | Admitting: Family Medicine

## 2024-01-30 DIAGNOSIS — E1129 Type 2 diabetes mellitus with other diabetic kidney complication: Secondary | ICD-10-CM

## 2024-01-30 DIAGNOSIS — E1169 Type 2 diabetes mellitus with other specified complication: Secondary | ICD-10-CM

## 2024-01-30 MED ORDER — ICOSAPENT ETHYL 1 G PO CAPS: 2.0000 g | ORAL_CAPSULE | Freq: Two times a day (BID) | ORAL | 2 refills | Status: DC

## 2024-01-30 MED ORDER — TRULICITY 1.5 MG/0.5ML ~~LOC~~ SOAJ: 1.5000 mg | SUBCUTANEOUS | 0 refills | Status: DC

## 2024-01-30 NOTE — Telephone Encounter (Signed)
Filled Vascepa.

## 2024-02-09 ENCOUNTER — Ambulatory Visit: Payer: PPO | Admitting: Family Medicine

## 2024-02-09 ENCOUNTER — Encounter: Payer: Self-pay | Admitting: Family Medicine

## 2024-02-09 ENCOUNTER — Ambulatory Visit (INDEPENDENT_AMBULATORY_CARE_PROVIDER_SITE_OTHER): Payer: PPO | Admitting: Family Medicine

## 2024-02-09 VITALS — BP 145/68 | HR 73 | Ht 63.0 in | Wt 134.5 lb

## 2024-02-09 DIAGNOSIS — R5383 Other fatigue: Secondary | ICD-10-CM

## 2024-02-09 DIAGNOSIS — Z794 Long term (current) use of insulin: Secondary | ICD-10-CM | POA: Diagnosis not present

## 2024-02-09 DIAGNOSIS — E876 Hypokalemia: Secondary | ICD-10-CM

## 2024-02-09 DIAGNOSIS — E1169 Type 2 diabetes mellitus with other specified complication: Secondary | ICD-10-CM

## 2024-02-09 DIAGNOSIS — E039 Hypothyroidism, unspecified: Secondary | ICD-10-CM | POA: Diagnosis not present

## 2024-02-09 DIAGNOSIS — I152 Hypertension secondary to endocrine disorders: Secondary | ICD-10-CM | POA: Diagnosis not present

## 2024-02-09 DIAGNOSIS — R1012 Left upper quadrant pain: Secondary | ICD-10-CM | POA: Diagnosis not present

## 2024-02-09 DIAGNOSIS — E1159 Type 2 diabetes mellitus with other circulatory complications: Secondary | ICD-10-CM

## 2024-02-09 DIAGNOSIS — E1129 Type 2 diabetes mellitus with other diabetic kidney complication: Secondary | ICD-10-CM

## 2024-02-09 DIAGNOSIS — R61 Generalized hyperhidrosis: Secondary | ICD-10-CM | POA: Insufficient documentation

## 2024-02-09 DIAGNOSIS — E785 Hyperlipidemia, unspecified: Secondary | ICD-10-CM | POA: Diagnosis not present

## 2024-02-09 MED ORDER — METOPROLOL SUCCINATE ER 200 MG PO TB24: 200.0000 mg | ORAL_TABLET | Freq: Every day | ORAL | 1 refills | Status: DC

## 2024-02-09 MED ORDER — LEVOTHYROXINE SODIUM 88 MCG PO TABS: 88.0000 ug | ORAL_TABLET | Freq: Every day | ORAL | 1 refills | Status: DC

## 2024-02-09 MED ORDER — DILTIAZEM HCL ER 240 MG PO CP24: 240.0000 mg | ORAL_CAPSULE | Freq: Every day | ORAL | 1 refills | Status: DC

## 2024-02-09 MED ORDER — TRULICITY 1.5 MG/0.5ML ~~LOC~~ SOAJ: 1.5000 mg | SUBCUTANEOUS | 4 refills | Status: DC

## 2024-02-09 MED ORDER — POTASSIUM CHLORIDE CRYS ER 10 MEQ PO TBCR: 10.0000 meq | EXTENDED_RELEASE_TABLET | Freq: Every day | ORAL | 1 refills | Status: DC

## 2024-02-09 NOTE — Assessment & Plan Note (Addendum)
Well controlled with average glucose readings 103- 160 over past three months. Currently on Trulicity and insulin, with ongoing titration of decreasing insulin dose under the guidance of Wayland Salinas, [facility]. -Continue current medication regimen and titration plan with pharmacy  -Check Hemoglobin A1c today. - Foot exam done - pt plans to get diabetic eye exam - On statin  - on ARB

## 2024-02-09 NOTE — Progress Notes (Signed)
Established Patient Office Visit  Introduced to nurse practitioner role and practice setting.  All questions answered.  Discussed provider/patient relationship and expectations.   Subjective   Patient ID: Heather Burke, female    DOB: 25-Apr-1951  Age: 73 y.o. MRN: [id number]  Chief Complaint  Patient presents with   Follow-up    3 month follow up     Heather Burke is a 73 year old female with diabetes, HTN, HLD who presents with for medication mgmt and concerns for persistent LUQ abdominal pain.t.  LUQ pain - persistent since December, which occasionally becomes achy to sharp and radiates lower. The pain is primarily located in the upper right quadrant. She uses a heated bed buddy and takes ibuprofen for relief. She has a history of diverticuli noted on a colonoscopy in 2017. She has a poor appetite, unusual fatigue, episodes of hot flashes, and waking up with a wet head. No nausea, vomiting, or diarrhea, but she notes sometimes has low-grade fevers around 8F. Her bowel movements are normal. Denies constipation. Appetite lower.   DMII - Dexcom monitor, with blood sugar readings consistently average of 129 for last three months with a range of 103 - 160. She has been reducing her insulin dosage by two units weekly, currently at 24 units, under the guidance of [place]. Her average glucose readings are 120 for the past week, 128 for the month, and 129 for the last three months.  She has experienced weight loss, currently weighing 134 pounds, down from 165 pounds in August 2024.  Her current medications include Trulicity, Synthroid 88 mcg, potassium supplements, diltiazem, metoprolol 200 mg once daily, and fenofibrate. She obtains Trulicity through [employer]. She has a history of breast cancer treated with L mastectomy eight to nine years ago, without the need for radiation or chemotherapy. She follows up with regular mammograms and is currently on an annual schedule.        02/09/2024   10:57 AM 12/26/2023   10:50 AM 11/09/2023   11:22 AM  Depression screen PHQ 2/9  Decreased Interest 2 0 0  Down, Depressed, Hopeless 0 0 0  PHQ - 2 Score 2 0 0  Altered sleeping 3 0 0  Tired, decreased energy 3 0 1  Change in appetite 3 0 1  Feeling bad or failure about yourself  0 0 0  Trouble concentrating 0 0 0  Moving slowly or fidgety/restless 0 0 0  Suicidal thoughts 0 0 0  PHQ-9 Score 11 0 2  Difficult doing work/chores Not difficult at all Not difficult at all Not difficult at all       02/09/2024   10:58 AM 11/09/2023   11:23 AM 08/09/2023   10:06 AM  GAD 7 : Generalized Anxiety Score  Nervous, Anxious, on Edge 0 0 1  Control/stop worrying 0 0 0  Worry too much - different things 0 0 0  Trouble relaxing 0 0 0  Restless 0 0 0  Easily annoyed or irritable 0 0 0  Afraid - awful might happen 0 0 0  Total GAD 7 Score 0 0 1  Anxiety Difficulty Not difficult at all Not difficult at all Not difficult at all     Review of Systems  Constitutional:  Positive for diaphoresis, malaise/fatigue and weight loss.  Gastrointestinal:  Positive for abdominal pain.  All other systems reviewed and are negative.   Negative unless indicated in HPI   Objective:     BP (!) 145/68  Pulse 73   Ht 5\' 3"  (1.6 m)   Wt 134 lb 8 oz (61 kg)   SpO2 100%   BMI 23.83 kg/m    Physical Exam Constitutional:      General: She is not in acute distress.    Appearance: Normal appearance. She is obese. She is not toxic-appearing or diaphoretic.  HENT:     Head: Normocephalic.     Nose: Nose normal.     Mouth/Throat:     Mouth: Mucous membranes are moist.     Pharynx: Oropharynx is clear.  Eyes:     Extraocular Movements: Extraocular movements intact.     Pupils: Pupils are equal, round, and reactive to light.  Cardiovascular:     Rate and Rhythm: Normal rate and regular rhythm.     Pulses: Normal pulses.          Dorsalis pedis pulses are 2+ on the right side and 2+ on  the left side.       Posterior tibial pulses are 2+ on the right side and 2+ on the left side.     Heart sounds: Normal heart sounds. No murmur heard.    No friction rub. No gallop.  Pulmonary:     Effort: No respiratory distress.     Breath sounds: No stridor. No wheezing, rhonchi or rales.  Chest:     Chest wall: No tenderness.  Abdominal:     General: Abdomen is protuberant. Bowel sounds are normal. There is no distension or abdominal bruit. There are no signs of injury.     Palpations: Abdomen is soft. There is no fluid wave, hepatomegaly, splenomegaly, mass or pulsatile mass.     Tenderness: There is abdominal tenderness in the left upper quadrant. Negative signs include Murphy's sign, Rovsing's sign, McBurney's sign and obturator sign.     Hernia: No hernia is present.  Musculoskeletal:     Right lower leg: No edema.     Left lower leg: No edema.     Right foot: Normal range of motion. No deformity, bunion, Charcot foot, foot drop or prominent metatarsal heads.     Left foot: Normal range of motion. No deformity, bunion, Charcot foot, foot drop or prominent metatarsal heads.  Feet:     Right foot:     Protective Sensation: 10 sites tested.  10 sites sensed.     Skin integrity: Dry skin present.     Toenail Condition: Right toenails are abnormally thick.     Left foot:     Protective Sensation: 10 sites tested.  10 sites sensed.     Skin integrity: Dry skin present.     Toenail Condition: Left toenails are abnormally thick.  Skin:    General: Skin is warm and dry.     Capillary Refill: Capillary refill takes less than 2 seconds.  Neurological:     General: No focal deficit present.     Mental Status: She is alert and oriented to person, place, and time. Mental status is at baseline.  Psychiatric:        Mood and Affect: Mood normal.        Behavior: Behavior normal.        Thought Content: Thought content normal.        Judgment: Judgment normal.     No results found  for any visits on 02/09/24.    The 10-year ASCVD risk score (Arnett DK, et al., 2019) is: 47.6%    Assessment & Plan:  Left upper quadrant abdominal pain Assessment & Plan: Subacute on Chronic primarily upper Left upper quadrant pain, with some variation in location.  No associated nausea, vomiting, diarrhea, or fever.  No masses, lumps, or obvious signs of GI concerns on physical exam.  Slight tenderness to LUQ, otherwise no pain to palpitation Bowel sounds active, soft, non-distended Skin intact History of diverticuli noted on previous colonoscopy 2017. -Order labs including CBC with differential, lipids, CRP, ESR -Order abdominal ultrasound. -Consider referral to GI if no clear cause identified from labs and imaging. -Discussed journaling symptoms -Smaller more frequent meals AIC checked today as well  Orders: -     CBC with Differential/Platelet -     Iron, TIBC and Ferritin Panel -     C-reactive protein -     Sedimentation rate -     US Abdomen Complete; Future  Type 2 diabetes mellitus with other diabetic kidney complication (HCC) Assessment & Plan: Well controlled with average glucose readings 103- 160 over past three months. Currently on Trulicity and insulin, with ongoing titration of decreasing insulin dose under the guidance of [place], [facility] -Continue current medication regimen and titration plan with pharmacy  -Check Hemoglobin A1c today. - Foot exam done - pt plans to get diabetic eye exam - On statin  - on ARB  Orders: -     Hemoglobin A1c -     Lipid panel -     Trulicity; Inject 1.5 mg into the skin once a week.  Dispense: 2 mL; Refill: 4  Hypertension associated with diabetes (HCC) Assessment & Plan: Chronic, borderline elevated today GOAL<130/80 Managed with valsartan/hydrochlorothiazide and metoprolol. Continue low sodium diet, exercise Monitor at home with upper arm cuff   Orders: -     dilTIAZem HCl ER; Take 1 capsule (240 mg  total) by mouth daily.  Dispense: 90 capsule; Refill: 1 -     Metoprolol Succinate ER; Take 1 tablet (200 mg total) by mouth daily.  Dispense: 90 tablet; Refill: 1  Hypothyroidism, unspecified type Assessment & Plan: Chronic, stable Increased fatigue, night sweats, and weight loss (could be due to DM) Repeat tsh On 88 mcg    Orders: -     Levothyroxine Sodium; Take 1 tablet (88 mcg total) by mouth daily.  Dispense: 90 tablet; Refill: 1  Sweating increase Assessment & Plan: Increased sweating, feeling hot Denies fevers, recent infections, or exposure to infections Breath sounds clear Hx hypothyroidism and breast CA Will check TSH and CBC to ensure thyroid levels, and blood count levels  Orders: -     TSH  Hypokalemia Assessment & Plan: Chronic Will reorder potassium chloride po for pt  Orders: -     Potassium Chloride Crys ER; Take 1 tablet (10 mEq total) by mouth daily.  Dispense: 90 tablet; Refill: 1  Fatigue, unspecified type Assessment & Plan: Increase fatigue over past three months, vague generalized Does not feel like her self Will check labs - CBC, CMP, A1C, TSH, CRP, ESR, vitamin D, vitamin b12 Increase water intake, sleep hygiene, exercise  Orders: -     TSH -     Vitamin B12 -     VITAMIN D 25 Hydroxy (Vit-D Deficiency, Fractures) -     CBC with Differential/Platelet -     Iron, TIBC and Ferritin Panel -     C-reactive protein -     Sedimentation rate  Hyperlipidemia associated with type 2 diabetes mellitus (HCC) Assessment & Plan: Chronic, previously elevated Repeat LP LDL  goal <70 Continue fenofibrate daily Hx of statin intolerance Vascepa - expensive for pt, Can trial OTC omega 3 supplement instead recommend diet low in saturated fat and regular exercise - 30 min at least 5 times per week       Will call with lab results  Return in about 4 months (around 06/08/2024), or if symptoms worsen or fail to improve, for BP and DMII check.   I,  Sallee Provencal, FNP, have reviewed all documentation for this visit. The documentation on 02/09/24 for the exam, diagnosis, procedures, and orders are all accurate and complete.   Sallee Provencal, FNP

## 2024-02-09 NOTE — Assessment & Plan Note (Addendum)
Increased sweating, feeling hot Denies fevers, recent infections, or exposure to infections Breath sounds clear Hx hypothyroidism and breast CA Will check TSH and CBC to ensure thyroid levels, and blood count levels

## 2024-02-09 NOTE — Assessment & Plan Note (Signed)
Chronic, stable Increased fatigue, night sweats, and weight loss (could be due to DM) Repeat tsh On 88 mcg

## 2024-02-09 NOTE — Assessment & Plan Note (Signed)
Chronic Will reorder potassium chloride po for pt

## 2024-02-09 NOTE — Assessment & Plan Note (Addendum)
Subacute on Chronic primarily upper Left upper quadrant pain, with some variation in location.  No associated nausea, vomiting, diarrhea, or fever.  No masses, lumps, or obvious signs of GI concerns on physical exam.  Slight tenderness to LUQ, otherwise no pain to palpitation Bowel sounds active, soft, non-distended Skin intact History of diverticuli noted on previous colonoscopy 2017. -Order labs including CBC with differential, lipids, CRP, ESR -Order abdominal ultrasound. -Consider referral to GI if no clear cause identified from labs and imaging. -Discussed journaling symptoms -Smaller more frequent meals AIC checked today as well

## 2024-02-09 NOTE — Assessment & Plan Note (Addendum)
Chronic, previously elevated Repeat LP LDL goal <70 Continue fenofibrate daily Hx of statin intolerance Vascepa - expensive for pt, Can trial OTC omega 3 supplement instead recommend diet low in saturated fat and regular exercise - 30 min at least 5 times per week

## 2024-02-09 NOTE — Assessment & Plan Note (Signed)
Chronic, borderline elevated today GOAL<130/80 Managed with valsartan/hydrochlorothiazide and metoprolol. Continue low sodium diet, exercise Monitor at home with upper arm cuff

## 2024-02-09 NOTE — Assessment & Plan Note (Signed)
Increase fatigue over past three months, vague generalized Does not feel like her self Will check labs - CBC, CMP, A1C, TSH, CRP, ESR, vitamin D, vitamin b12 Increase water intake, sleep hygiene, exercise

## 2024-02-10 ENCOUNTER — Emergency Department
Admission: EM | Admit: 2024-02-10 | Discharge: 2024-02-10 | Disposition: A | Discharge status: Home / Self Care | Payer: PPO | Attending: Emergency Medicine | Admitting: Emergency Medicine

## 2024-02-10 ENCOUNTER — Emergency Department: Payer: PPO

## 2024-02-10 ENCOUNTER — Encounter: Payer: Self-pay | Admitting: Family Medicine

## 2024-02-10 ENCOUNTER — Other Ambulatory Visit: Payer: Self-pay

## 2024-02-10 DIAGNOSIS — K869 Disease of pancreas, unspecified: Secondary | ICD-10-CM | POA: Diagnosis not present

## 2024-02-10 DIAGNOSIS — E1165 Type 2 diabetes mellitus with hyperglycemia: Secondary | ICD-10-CM | POA: Diagnosis not present

## 2024-02-10 DIAGNOSIS — I700 Atherosclerosis of aorta: Secondary | ICD-10-CM | POA: Insufficient documentation

## 2024-02-10 DIAGNOSIS — M329 Systemic lupus erythematosus, unspecified: Secondary | ICD-10-CM | POA: Diagnosis not present

## 2024-02-10 DIAGNOSIS — Z794 Long term (current) use of insulin: Secondary | ICD-10-CM | POA: Insufficient documentation

## 2024-02-10 DIAGNOSIS — D499 Neoplasm of unspecified behavior of unspecified site: Secondary | ICD-10-CM | POA: Insufficient documentation

## 2024-02-10 DIAGNOSIS — N2889 Other specified disorders of kidney and ureter: Secondary | ICD-10-CM | POA: Diagnosis not present

## 2024-02-10 DIAGNOSIS — K8689 Other specified diseases of pancreas: Secondary | ICD-10-CM

## 2024-02-10 DIAGNOSIS — R590 Localized enlarged lymph nodes: Secondary | ICD-10-CM | POA: Diagnosis not present

## 2024-02-10 DIAGNOSIS — R599 Enlarged lymph nodes, unspecified: Secondary | ICD-10-CM | POA: Diagnosis not present

## 2024-02-10 DIAGNOSIS — E119 Type 2 diabetes mellitus without complications: Secondary | ICD-10-CM | POA: Insufficient documentation

## 2024-02-10 DIAGNOSIS — R160 Hepatomegaly, not elsewhere classified: Secondary | ICD-10-CM | POA: Diagnosis not present

## 2024-02-10 DIAGNOSIS — I7143 Infrarenal abdominal aortic aneurysm, without rupture: Secondary | ICD-10-CM | POA: Insufficient documentation

## 2024-02-10 DIAGNOSIS — R1012 Left upper quadrant pain: Secondary | ICD-10-CM | POA: Insufficient documentation

## 2024-02-10 LAB — CBC WITH DIFFERENTIAL/PLATELET
Basophils Absolute: 0 10*3/uL (ref 0.0–0.2)
Basos: 0 %
EOS (ABSOLUTE): 0.1 10*3/uL (ref 0.0–0.4)
Eos: 1 %
Hematocrit: 37.6 % (ref 34.0–46.6)
Hemoglobin: 12.3 g/dL (ref 11.1–15.9)
Immature Grans (Abs): 0 10*3/uL (ref 0.0–0.1)
Immature Granulocytes: 0 %
Lymphocytes Absolute: 1.1 10*3/uL (ref 0.7–3.1)
Lymphs: 13 %
MCH: 30.2 pg (ref 26.6–33.0)
MCHC: 32.7 g/dL (ref 31.5–35.7)
MCV: 92 fL (ref 79–97)
Monocytes Absolute: 0.8 10*3/uL (ref 0.1–0.9)
Monocytes: 10 %
Neutrophils Absolute: 6.7 10*3/uL (ref 1.4–7.0)
Neutrophils: 76 %
Platelets: 445 10*3/uL (ref 150–450)
RBC: 4.07 x10E6/uL (ref 3.77–5.28)
RDW: 12.8 % (ref 11.7–15.4)
WBC: 8.7 10*3/uL (ref 3.4–10.8)

## 2024-02-10 LAB — COMPREHENSIVE METABOLIC PANEL
ALT: 17 U/L (ref 0–44)
AST: 33 U/L (ref 15–41)
Albumin: 3 g/dL — ABNORMAL LOW (ref 3.5–5.0)
Alkaline Phosphatase: 82 U/L (ref 38–126)
Anion gap: 11 (ref 5–15)
BUN: 8 mg/dL (ref 8–23)
CO2: 24 mmol/L (ref 22–32)
Calcium: 8.8 mg/dL — ABNORMAL LOW (ref 8.9–10.3)
Chloride: 95 mmol/L — ABNORMAL LOW (ref 98–111)
Creatinine, Ser: 0.65 mg/dL (ref 0.44–1.00)
GFR, Estimated: >60 mL/min (ref >60)
Glucose, Bld: 132 mg/dL — ABNORMAL HIGH (ref 70–99)
Potassium: 3.5 mmol/L (ref 3.5–5.1)
Sodium: 130 mmol/L — ABNORMAL LOW (ref 135–145)
Total Bilirubin: 0.5 mg/dL (ref 0.0–1.2)
Total Protein: 7.4 g/dL (ref 6.5–8.1)

## 2024-02-10 LAB — TSH: TSH: 1.95 u[IU]/mL (ref 0.450–4.500)

## 2024-02-10 LAB — LIPID PANEL
Chol/HDL Ratio: 3.6 {ratio} (ref 0.0–4.4)
Cholesterol, Total: 79 mg/dL — ABNORMAL LOW (ref 100–199)
HDL: 22 mg/dL — ABNORMAL LOW (ref >39)
LDL Chol Calc (NIH): 39 mg/dL (ref 0–99)
Triglycerides: 89 mg/dL (ref 0–149)
VLDL Cholesterol Cal: 18 mg/dL (ref 5–40)

## 2024-02-10 LAB — URINALYSIS, ROUTINE W REFLEX MICROSCOPIC
Bilirubin Urine: NEGATIVE
Glucose, UA: NEGATIVE mg/dL
Hgb urine dipstick: NEGATIVE
Ketones, ur: NEGATIVE mg/dL
Leukocytes,Ua: NEGATIVE
Nitrite: NEGATIVE
Protein, ur: NEGATIVE mg/dL
Specific Gravity, Urine: 1.006 (ref 1.005–1.030)
pH: 6 (ref 5.0–8.0)

## 2024-02-10 LAB — IRON,TIBC AND FERRITIN PANEL
Ferritin: 1322 ng/mL — ABNORMAL HIGH (ref 15–150)
Iron Saturation: 8 % — CL (ref 15–55)
Iron: 17 ug/dL — ABNORMAL LOW (ref 27–139)
Total Iron Binding Capacity: 203 ug/dL — ABNORMAL LOW (ref 250–450)
UIBC: 186 ug/dL (ref 118–369)

## 2024-02-10 LAB — VITAMIN B12: Vitamin B-12: 1352 pg/mL — ABNORMAL HIGH (ref 232–1245)

## 2024-02-10 LAB — CBC
HCT: 36 % (ref 36.0–46.0)
Hemoglobin: 12 g/dL (ref 12.0–15.0)
MCH: 30.6 pg (ref 26.0–34.0)
MCHC: 33.3 g/dL (ref 30.0–36.0)
MCV: 91.8 fL (ref 80.0–100.0)
Platelets: 434 10*3/uL — ABNORMAL HIGH (ref 150–400)
RBC: 3.92 MIL/uL (ref 3.87–5.11)
RDW: 13.4 % (ref 11.5–15.5)
WBC: 10.3 10*3/uL (ref 4.0–10.5)
nRBC: 0 % (ref 0.0–0.2)

## 2024-02-10 LAB — RESP PANEL BY RT-PCR (RSV, FLU A&B, COVID)  RVPGX2
Influenza A by PCR: NEGATIVE
Influenza B by PCR: NEGATIVE
Resp Syncytial Virus by PCR: NEGATIVE
SARS Coronavirus 2 by RT PCR: NEGATIVE

## 2024-02-10 LAB — C-REACTIVE PROTEIN: CRP: 131 mg/L — ABNORMAL HIGH (ref 0–10)

## 2024-02-10 LAB — SEDIMENTATION RATE: Sed Rate: 22 mm/h (ref 0–40)

## 2024-02-10 LAB — HEMOGLOBIN A1C
Est. average glucose Bld gHb Est-mCnc: 151 mg/dL
Hgb A1c MFr Bld: 6.9 % — ABNORMAL HIGH (ref 4.8–5.6)

## 2024-02-10 LAB — LIPASE, BLOOD: Lipase: 16 U/L (ref 11–51)

## 2024-02-10 LAB — VITAMIN D 25 HYDROXY (VIT D DEFICIENCY, FRACTURES): Vit D, 25-Hydroxy: 45.8 ng/mL (ref 30.0–100.0)

## 2024-02-10 MED ORDER — IOHEXOL 350 MG/ML SOLN
75.0000 mL | Freq: Once | INTRAVENOUS | Status: AC | PRN
  Administered 2024-02-10: 75 mL via INTRAVENOUS

## 2024-02-10 MED ORDER — OXYCODONE-ACETAMINOPHEN 5-325 MG PO TABS: 0.5000 | ORAL_TABLET | ORAL | 0 refills | Status: AC | PRN

## 2024-02-10 MED ORDER — SODIUM CHLORIDE 0.9 % IV BOLUS
500.0000 mL | Freq: Once | INTRAVENOUS | Status: AC
  Administered 2024-02-10: 500 mL via INTRAVENOUS

## 2024-02-10 NOTE — ED Provider Notes (Signed)
 [facility] Provider Note    Event Date/Time   First MD Initiated Contact with Patient 02/10/24 1540     (approximate)   History   Chief Complaint: Abnormal Lab   HPI  Heather Burke is a 73 y.o. female with a history of diabetes, lupus, GERD who comes ED complaining of left upper quadrant abdominal pain for the past 5 days.  Also has decreased appetite which has been going on for a year, but may be worse in the last few days.  Does feel like she is drinking enough fluids.  Denies dizziness, no shortness of breath or chest pain.  Patient does report that over the last 2 nights she has had night sweats, and yesterday evening she noted a fever of 101 at home.  Denies back pain or dysuria          Physical Exam   Triage Vital Signs: ED Triage Vitals [02/10/24 1453]  Encounter Vitals Group     BP (!) 151/78     Systolic BP Percentile      Diastolic BP Percentile      Pulse Rate 81     Resp 16     Temp 98.5 F (36.9 C)     Temp Source Oral     SpO2 99 %     Weight 134 lb (60.8 kg)     Height 5\' 3"  (1.6 m)     Head Circumference      Peak Flow      Pain Score 3     Pain Loc      Pain Education      Exclude from Growth Chart     Most recent vital signs: Vitals:   02/10/24 1856 02/10/24 1900  BP:  132/76  Pulse:  81  Resp:  (!) 29  Temp: 97.9 F (36.6 C)   SpO2:  98%    General: Awake, no distress.  CV:  Good peripheral perfusion.  Regular rate rhythm Resp:  Normal effort.  Clear to auscultation bilaterally Abd:  No distention.  Soft with mild left upper quadrant tenderness Other:  Somewhat dry oral mucosa   ED Results / Procedures / Treatments   Labs (all labs ordered are listed, but only abnormal results are displayed) Labs Reviewed  COMPREHENSIVE METABOLIC PANEL - Abnormal; Notable for the following components:      Result Value   Sodium 130 (*)    Chloride 95 (*)    Glucose, Bld 132 (*)    Calcium 8.8 (*)     Albumin 3.0 (*)    All other components within normal limits  CBC - Abnormal; Notable for the following components:   Platelets 434 (*)    All other components within normal limits  URINALYSIS, ROUTINE W REFLEX MICROSCOPIC - Abnormal; Notable for the following components:   Color, Urine YELLOW (*)    APPearance CLEAR (*)    All other components within normal limits  RESP PANEL BY RT-PCR (RSV, FLU A&B, COVID)  RVPGX2  LIPASE, BLOOD  CA 19-9 (SERIAL)     EKG    RADIOLOGY CT interpreted by me, no SBO colonic abscess. Radiology report reviewed.   PROCEDURES:  Procedures   MEDICATIONS ORDERED IN ED: Medications  sodium chloride 0.9 % bolus 500 mL (0 mLs Intravenous Stopped 02/10/24 1735)  iohexol (OMNIPAQUE) 350 MG/ML injection 75 mL (75 mLs Intravenous Contrast Given 02/10/24 1720)     IMPRESSION / MDM / ASSESSMENT AND PLAN /  ED COURSE  I reviewed the triage vital signs and the nursing notes.  DDx: Electrolyte derangement, AKI, UTI, pancreatitis, anemia, splenomegaly, diverticulitis, COVID, influenza, other viral illness, gastritis  Patient's presentation is most consistent with acute presentation with potential threat to life or bodily function.  Patient presents with left upper quadrant pain, fever at home.  Here her vital signs are essentially normal, afebrile.  With lupus and diabetes but is potentially suppressing patient's potential immune response and inflammatory symptoms, will obtain CT abdomen pelvis to further evaluate while giving some IV fluids to ensure adequate hydration.  ----------------------------------------- 7:51 PM on 02/10/2024 ----------------------------------------- CT reveals pancreatic and renal masses and suspected metastatic disease. Pt informed. D/w heme/onc Dr. Smith Robert who can f/u this week and arrange bx.       FINAL CLINICAL IMPRESSION(S) / ED DIAGNOSES   Final diagnoses:  Pancreatic mass  Type 2 diabetes mellitus without complication,  with long-term current use of insulin (HCC)  Systemic lupus erythematosus, unspecified SLE type, unspecified organ involvement status (HCC)     Rx / DC Orders   ED Discharge Orders          Ordered    oxyCODONE-acetaminophen (PERCOCET) 5-325 MG tablet  Every 4 hours PRN        02/10/24 1927             Note:  This document was prepared using Dragon voice recognition software and may include unintentional dictation errors.   Sharman Cheek, MD 02/10/24 [phone]

## 2024-02-10 NOTE — ED Triage Notes (Signed)
 Pt sent to ED d/t abnormal lab work that was drawn yesterday with elevated CRP and low iron. Pt c/o abd pain

## 2024-02-10 NOTE — ED Notes (Signed)
 Instructed patient to call RN when ready to provide UA sample.

## 2024-02-11 ENCOUNTER — Other Ambulatory Visit: Payer: Self-pay | Admitting: Oncology

## 2024-02-11 DIAGNOSIS — C787 Secondary malignant neoplasm of liver and intrahepatic bile duct: Secondary | ICD-10-CM

## 2024-02-11 DIAGNOSIS — K8689 Other specified diseases of pancreas: Secondary | ICD-10-CM

## 2024-02-12 ENCOUNTER — Telehealth: Payer: Self-pay

## 2024-02-12 ENCOUNTER — Other Ambulatory Visit: Payer: Self-pay

## 2024-02-12 DIAGNOSIS — C787 Secondary malignant neoplasm of liver and intrahepatic bile duct: Secondary | ICD-10-CM

## 2024-02-12 DIAGNOSIS — H40153 Residual stage of open-angle glaucoma, bilateral: Secondary | ICD-10-CM | POA: Diagnosis not present

## 2024-02-12 DIAGNOSIS — K8689 Other specified diseases of pancreas: Secondary | ICD-10-CM

## 2024-02-12 NOTE — Telephone Encounter (Signed)
 Initial call placed to Heather Burke. Introduced self as Statistician. Provided information regarding need for PET scan and liver biopsy. Confirmed last dose of 81mg  asa as 2/23 pm. She will hold further doses pending liver biopsy. Will arrange PET, biopsy and initial appointment with Dr. Smith Robert and call her with further education and instructions once arranged. All questions answered.

## 2024-02-13 ENCOUNTER — Telehealth: Payer: Self-pay

## 2024-02-13 LAB — CA 19-9 (SERIAL): CA 19-9: 4720 U/mL — ABNORMAL HIGH (ref 0–35)

## 2024-02-13 NOTE — Telephone Encounter (Addendum)
 Spoke with Heather Burke. We have reviewed all upcoming tests/procedures/appointments. All questions answered. She will continuing holding aspirin for upcoming liver biopsy on 3/4.   Liver biopsy scheduled for 02/20/2024  Arrive at 0730  for 0830 appointment Come into the Heart and Vascular entrance at [facility]. This entrance is located in the front of the hospital. Do not eat or drink anything after midnight Take your regularly scheduled blood pressure, pain, or seizure medication You will need a driver for this procedure

## 2024-02-14 ENCOUNTER — Encounter: Payer: Self-pay | Admitting: Family Medicine

## 2024-02-14 ENCOUNTER — Other Ambulatory Visit: Payer: Self-pay | Admitting: Pharmacist

## 2024-02-14 ENCOUNTER — Telehealth: Payer: Self-pay | Admitting: Pharmacist

## 2024-02-14 DIAGNOSIS — E1129 Type 2 diabetes mellitus with other diabetic kidney complication: Secondary | ICD-10-CM

## 2024-02-14 NOTE — Addendum Note (Signed)
 Addended by: Estelle Grumbles A on: 02/14/2024 03:39 PM   Modules accepted: Orders, Level of Service

## 2024-02-14 NOTE — Progress Notes (Signed)
 Heather Overlie, MD sent to [name] PROCEDURE / BIOPSY REVIEW Date: 02/12/24  Requested Biopsy site: Liver lesion Reason for request: Metastatic workup Imaging review: CT 02/10/24  Decision: Approved Imaging modality to perform: Ultrasound Schedule with: Moderate Sedation Schedule for: Any VIR  Additional comments: Left hepatic lesion  Please contact me with questions, concerns, or if issue pertaining to this request arise.  [name], MD [facility]

## 2024-02-14 NOTE — Telephone Encounter (Signed)
 This encounter was created in error - please disregard.

## 2024-02-14 NOTE — Progress Notes (Unsigned)
   02/14/2024  Patient ID: Heather Burke, female   DOB: 11/01/1951, 73 y.o.   MRN: [id number]  Receive a message from PCP sharing that patient seen in [facility] ED on 02/10/2024 and found to have liver lesions and a pancreatic mass based on CT results.  Note patient currently on Trulicity 1.5 mg weekly and Lantus insulin. Latest A1C: 6.9%. However, use of GLP-1 receptor agonists, such as Trulicity, have been associated with increased risk of inflammation within the pancreas.  Collaborated with Oncologist Owens Shark and CPP Lisette Grinder with [facility] regarding ongoing use of Trulicity in this patient given pancreatic mass found on CT on 02/10/2024. CPP Alyson Darcel Bayley advises okay for patient to continue Trulicity at this time.  Also collaborated with colleague Edison Pace, PharmD, CPP, CDE who recommends that insulin in the safest way to proceed, rather than continuing GLP-1 RA for this patient.  Follow up with PCP to provide these updates.  Estelle Grumbles, PharmD, [facility] [phone]

## 2024-02-14 NOTE — Progress Notes (Deleted)
 Collaborated with Oncologist Owens Shark and CPP Lisette Grinder with [facility] regarding ongoing use of Trulicity in this patient given pancreatic mass found on CT on 02/10/2024. CPP Alyson Darcel Bayley advises okay for patient to continue Trulicity at this time.

## 2024-02-19 ENCOUNTER — Other Ambulatory Visit (HOSPITAL_COMMUNITY): Payer: Self-pay | Admitting: Student

## 2024-02-19 ENCOUNTER — Ambulatory Visit
Admission: RE | Admit: 2024-02-19 | Discharge: 2024-02-19 | Disposition: A | Discharge status: Home / Self Care | Payer: PPO | Source: Ambulatory Visit | Attending: Oncology | Admitting: Oncology

## 2024-02-19 DIAGNOSIS — N2889 Other specified disorders of kidney and ureter: Secondary | ICD-10-CM | POA: Insufficient documentation

## 2024-02-19 DIAGNOSIS — C787 Secondary malignant neoplasm of liver and intrahepatic bile duct: Secondary | ICD-10-CM | POA: Diagnosis not present

## 2024-02-19 DIAGNOSIS — R590 Localized enlarged lymph nodes: Secondary | ICD-10-CM | POA: Diagnosis not present

## 2024-02-19 DIAGNOSIS — K8689 Other specified diseases of pancreas: Secondary | ICD-10-CM

## 2024-02-19 DIAGNOSIS — C7889 Secondary malignant neoplasm of other digestive organs: Secondary | ICD-10-CM | POA: Diagnosis not present

## 2024-02-19 DIAGNOSIS — C259 Malignant neoplasm of pancreas, unspecified: Secondary | ICD-10-CM | POA: Diagnosis present

## 2024-02-19 DIAGNOSIS — C250 Malignant neoplasm of head of pancreas: Secondary | ICD-10-CM | POA: Diagnosis not present

## 2024-02-19 LAB — GLUCOSE, CAPILLARY: Glucose-Capillary: 75 mg/dL (ref 70–99)

## 2024-02-19 MED ORDER — FLUDEOXYGLUCOSE F - 18 (FDG) INJECTION
7.1100 | Freq: Once | INTRAVENOUS | Status: AC | PRN
  Administered 2024-02-19: 7.11 via INTRAVENOUS

## 2024-02-19 NOTE — Progress Notes (Signed)
 Patient for US guided Liver Biopsy on Tues 02/20/24, I called and spoke with the patient on the phone and gave pre-procedure instructions. Pt was made aware to be here at 7:30a, last dose of ASA 81mg  was Sun 02/11/24, NPO after MN prior to procedure as well as driver post procedure/recovery/discharge. Pt stated understanding.  Called 02/19/24

## 2024-02-20 ENCOUNTER — Ambulatory Visit
Admission: RE | Admit: 2024-02-20 | Discharge: 2024-02-20 | Disposition: A | Discharge status: Home / Self Care | Payer: PPO | Source: Ambulatory Visit | Attending: Oncology | Admitting: Oncology

## 2024-02-20 ENCOUNTER — Ambulatory Visit: Payer: PPO | Admitting: Oncology

## 2024-02-20 ENCOUNTER — Other Ambulatory Visit: Payer: Self-pay

## 2024-02-20 ENCOUNTER — Other Ambulatory Visit: Payer: PPO

## 2024-02-20 DIAGNOSIS — C787 Secondary malignant neoplasm of liver and intrahepatic bile duct: Secondary | ICD-10-CM | POA: Diagnosis not present

## 2024-02-20 DIAGNOSIS — Z853 Personal history of malignant neoplasm of breast: Secondary | ICD-10-CM | POA: Insufficient documentation

## 2024-02-20 DIAGNOSIS — K769 Liver disease, unspecified: Secondary | ICD-10-CM | POA: Diagnosis present

## 2024-02-20 DIAGNOSIS — R160 Hepatomegaly, not elsewhere classified: Secondary | ICD-10-CM | POA: Diagnosis not present

## 2024-02-20 DIAGNOSIS — K8689 Other specified diseases of pancreas: Secondary | ICD-10-CM

## 2024-02-20 LAB — CBC
HCT: 38.1 % (ref 36.0–46.0)
Hemoglobin: 12.9 g/dL (ref 12.0–15.0)
MCH: 30.4 pg (ref 26.0–34.0)
MCHC: 33.9 g/dL (ref 30.0–36.0)
MCV: 89.9 fL (ref 80.0–100.0)
Platelets: 447 10*3/uL — ABNORMAL HIGH (ref 150–400)
RBC: 4.24 MIL/uL (ref 3.87–5.11)
RDW: 13.5 % (ref 11.5–15.5)
WBC: 11.8 10*3/uL — ABNORMAL HIGH (ref 4.0–10.5)
nRBC: 0 % (ref 0.0–0.2)

## 2024-02-20 LAB — PROTIME-INR
INR: 1.5 — ABNORMAL HIGH (ref 0.8–1.2)
Prothrombin Time: 18.2 s — ABNORMAL HIGH (ref 11.4–15.2)

## 2024-02-20 LAB — GLUCOSE, CAPILLARY: Glucose-Capillary: 79 mg/dL (ref 70–99)

## 2024-02-20 MED ORDER — FENTANYL CITRATE (PF) 100 MCG/2ML IJ SOLN
INTRAMUSCULAR | Status: AC
  Filled 2024-02-20: qty 2

## 2024-02-20 MED ORDER — LIDOCAINE HCL (PF) 1 % IJ SOLN
10.0000 mL | Freq: Once | INTRAMUSCULAR | Status: AC
  Administered 2024-02-20: 10 mL via INTRADERMAL
  Filled 2024-02-20: qty 10

## 2024-02-20 MED ORDER — MIDAZOLAM HCL 2 MG/2ML IJ SOLN
INTRAMUSCULAR | Status: AC
  Filled 2024-02-20: qty 2

## 2024-02-20 MED ORDER — FENTANYL CITRATE (PF) 100 MCG/2ML IJ SOLN
INTRAMUSCULAR | Status: AC | PRN
  Administered 2024-02-20: 50 ug via INTRAVENOUS

## 2024-02-20 MED ORDER — MIDAZOLAM HCL 2 MG/2ML IJ SOLN
INTRAMUSCULAR | Status: AC | PRN
  Administered 2024-02-20: 1 mg via INTRAVENOUS

## 2024-02-20 NOTE — H&P (Signed)
 Chief Complaint: Patient was seen in consultation today for liver lesion biopsy.   Referring Physician(s): Creig Hines  Supervising Physician: Irish Lack  Patient Status: [facility] - Out-pt  History of Present Illness: Heather Burke is a 73 y.o. female with hx of breast cancer but also now being worked up for findings of pancreatic mass with liver lesions concerning for metastatic process. She is referred for image guided liver biopsy.  PMHx, meds, labs, imaging, allergies reviewed. ASA has been held since 02/11/24 Feels well, no recent fevers, chills, illness. Has been NPO today as directed.   Past Medical History:  Diagnosis Date   Breast cancer (HCC) 06/08/2015   T1c, N0;  ER+,PR+, Her 2 neg (FISH neg) x2., SLN x 3 negative. Mammoprint: Low risk. Multifocal.Invasive mammary and lobular carcinoma.    Diabetes mellitus without complication (HCC)    GERD (gastroesophageal reflux disease)    Heart murmur 2016   newly diagnosed, slight murmur   Hyperlipidemia    Hypertension    Hypothyroidism    Lupus    PONV (postoperative nausea and vomiting)    with Hysterectomy   Thyroid disease     Past Surgical History:  Procedure Laterality Date   ABDOMINAL HYSTERECTOMY     BREAST BIOPSY Right 2002   negative/ Dr. Lemar Livings   BREAST BIOPSY Left 06-08-15   INVASIVE MAMMARY CARCINOMA WITH PARTIAL SOLID PATTERN.    BREAST BIOPSY Right 06/26/2018   FIBROADENOMATOUS CHANGES WITH STROMAL HYALINIZATION AND CALCIFICATIONS   COLONOSCOPY WITH PROPOFOL N/A 09/13/2016   Procedure: COLONOSCOPY WITH PROPOFOL;  Surgeon: Midge Minium, MD;  Location: [facility] ENDOSCOPY;  Service: Endoscopy;  Laterality: N/A;   core biopsy Left 06/08/15   GUM SURGERY  2006   MASTECTOMY Left 2016   OOPHORECTOMY     SENTINEL NODE BIOPSY Left 08/18/2015   Procedure: SENTINEL NODE BIOPSY;  Surgeon: Earline Mayotte, MD;  Location: [facility] ORS;  Service: General;  Laterality: Left;   SIMPLE MASTECTOMY WITH AXILLARY  SENTINEL NODE BIOPSY Left 08/18/2015   Procedure: SIMPLE MASTECTOMY;  Surgeon: Earline Mayotte, MD;  Location: [facility] ORS;  Service: General;  Laterality: Left;   TONSILLECTOMY      Allergies: Metformin and related, Amlodipine, Atorvastatin, Lisinopril, Pravastatin, Icosapent ethyl (epa ethyl ester) (fish), and Nifedipine er  Medications: Prior to Admission medications   Medication Sig Start Date End Date Taking? Authorizing Provider  ACCU-CHEK GUIDE TEST test strip USE 1 STIP TO CHECK BLOOD SUGAR IN THE MORNING, AT NOON, AND AT BEDTIME 11/07/23   Jacky Kindle, FNP  Accu-Chek Softclix Lancets lancets 1 EACH BY DOES NOT APPLY ROUTE IN THE MORNING, AT NOON, AND AT BEDTIME 11/22/23   Jacky Kindle, FNP  aspirin 81 MG tablet Take 81 mg by mouth daily.    [provider]  Blood Glucose Monitoring Suppl DEVI 1 each by Does not apply route in the morning, at noon, and at bedtime. May substitute to any manufacturer covered by patient's insurance. 08/10/23   Jacky Kindle, FNP  Cholecalciferol 125 MCG (5000 UT) TABS Take 1 tablet by mouth daily.    [provider]  Continuous Glucose Sensor (FREESTYLE LIBRE 3 PLUS SENSOR) MISC Place 1 sensor on the skin every 15 days. Use to check glucose continuously 11/29/23   Jacky Kindle, FNP  diltiazem (DILT-XR) 240 MG 24 hr capsule Take 1 capsule (240 mg total) by mouth daily. 02/09/24   Sallee Provencal, FNP  Dulaglutide (TRULICITY) 1.5 MG/0.5ML SOAJ Inject  1.5 mg into the skin once a week. 02/09/24   Sallee Provencal, FNP  fenofibrate (TRICOR) 145 MG tablet Take 1 tablet (145 mg total) by mouth daily. 08/10/23   Jacky Kindle, FNP  fluticasone [facility]) 50 MCG/ACT nasal spray SPRAY 2 SPRAYS INTO EACH NOSTRIL EVERY DAY 11/22/23   Jacky Kindle, FNP  icosapent Ethyl (VASCEPA) 1 g capsule Take 2 capsules (2 g total) by mouth 2 (two) times daily. Patient not taking: Reported on 02/09/2024 01/30/24   Charlcie Cradle A, FNP  insulin glargine  (LANTUS) 100 UNIT/ML Solostar Pen Inject 40 Units into the skin at bedtime. Titrate by 2 units every 3 days for fasting morning blood sugar >150. Patient taking differently: Inject 28 Units into the skin at bedtime. Titrate by 2 units every 3 days for fasting morning blood sugar >150. 11/09/23   [name] T, FNP  Insulin Pen Needle (PEN NEEDLES 31GX5/16") 31G X 8 MM MISC 1 each by Does not apply route daily. 08/10/23   Jacky Kindle, FNP  latanoprost (XALATAN) 0.005 % ophthalmic solution 1 drop at bedtime. 07/05/22   [provider]  levothyroxine (SYNTHROID) 88 MCG tablet Take 1 tablet (88 mcg total) by mouth daily. 02/09/24   Sallee Provencal, FNP  metoprolol (TOPROL-XL) 200 MG 24 hr tablet Take 1 tablet (200 mg total) by mouth daily. 02/09/24   Sallee Provencal, FNP  Misc Natural Products (T-RELIEF CBD+13 SL) Place 500 mg under the tongue daily.    [provider]  Nutritional Supplements (QUINOA/KALE/HEMP PO) Place 750 mg under the tongue.    [provider]  potassium chloride (KLOR-CON M10) 10 MEQ tablet Take 1 tablet (10 mEq total) by mouth daily. 02/09/24   Sallee Provencal, FNP  valsartan-hydrochlorothiazide (DIOVAN-HCT) 320-12.5 MG tablet Take 1 tablet by mouth daily. 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  Tobacco Use   Smoking status: Every Day    Current packs/day: 2.00    Average packs/day: 2.0 packs/day for 49.0 years (98.0 ttl pk-yrs)    Types: Cigarettes   Smokeless  tobacco: Never  Vaping Use   Vaping status: Never Used  Substance and Sexual Activity   Alcohol use: Yes    Alcohol/week: 14.0 - 28.0 standard drinks of alcohol    Types: 14 - 28 Cans of beer per week    Comment: 2-3 beers a day - declined cutting back anymore   Drug use: No   Sexual activity: Not on file  Other Topics Concern   Not on file  Social History Narrative   Not on file   Social Drivers of Health   Financial Resource Strain: Low Risk  (12/26/2023)   Overall Financial Resource Strain (CARDIA)    Difficulty of Paying Living Expenses: Not hard at all  Food Insecurity: No Food Insecurity (  12/26/2023)   Hunger Vital Sign    Worried About Running Out of Food in the Last Year: Never true    Ran Out of Food in the Last Year: Never true  Transportation Needs: No Transportation Needs (12/26/2023)   PRAPARE - Administrator, Civil Service (Medical): No    Lack of Transportation (Non-Medical): No  Physical Activity: Insufficiently Active (12/26/2023)   Exercise Vital Sign    Days of Exercise per Week: 4 days    Minutes of Exercise per Session: 30 min  Stress: No Stress Concern Present (12/26/2023)   [employer] of Occupational Health - Occupational Stress Questionnaire    Feeling of Stress : Not at all  Social Connections: Moderately Isolated (12/26/2023)   Social Connection and Isolation Panel [NHANES]    Frequency of Communication with Friends and Family: More than three times a week    Frequency of Social Gatherings with Friends and Family: More than three times a week    Attends Religious Services: 1 to 4 times per year    Active Member of [employer] or Organizations: No    Attends Banker Meetings: Never    Marital Status: Widowed    Review of Systems: A 12 point ROS discussed and pertinent positives are indicated in the HPI above.  All other systems are negative.  Review of Systems  Vital Signs: BP (!) 133/55   Pulse 66   Resp 19   Wt 129 lb 14.4  oz (58.9 kg)   SpO2 97%   BMI 23.01 kg/m   Physical Exam Constitutional:      Appearance: She is not ill-appearing.  HENT:     Mouth/Throat:     Mouth: Mucous membranes are moist.     Pharynx: Oropharynx is clear.  Cardiovascular:     Rate and Rhythm: Normal rate and regular rhythm.     Heart sounds: Normal heart sounds.  Pulmonary:     Effort: Pulmonary effort is normal. No respiratory distress.     Breath sounds: Normal breath sounds.  Abdominal:     General: There is no distension.     Palpations: Abdomen is soft.     Tenderness: There is no abdominal tenderness.  Neurological:     General: No focal deficit present.     Mental Status: She is alert and oriented to person, place, and time.  Psychiatric:        Mood and Affect: Mood normal.        Thought Content: Thought content normal.     Imaging: CT ABDOMEN PELVIS W CONTRAST Result Date: 02/10/2024 CLINICAL DATA:  Left lower quadrant abdominal pain EXAM: CT ABDOMEN AND PELVIS WITH CONTRAST TECHNIQUE: Multidetector CT imaging of the abdomen and pelvis was performed using the standard protocol following bolus administration of intravenous contrast. RADIATION DOSE REDUCTION: This exam was performed according to the departmental dose-optimization program which includes automated exposure control, adjustment of the mA and/or kV according to patient size and/or use of iterative reconstruction technique. CONTRAST:  75mL OMNIPAQUE IOHEXOL 350 MG/ML SOLN COMPARISON:  None Available. FINDINGS: Lower chest: No acute pleural or parenchymal lung disease. Dense calcification of the mitral valve. Hepatobiliary: There are multiple hypodense lesions within the liver, concerning for metastatic disease. The 2 largest lesions are seen within the left lobe, measuring 4.0 x 3.7 cm and 3.5 x 2.9 cm, reference images 12 and 13 of series 2. Gallbladder is decompressed, with nonspecific gallbladder wall thickening. No pericholecystic fluid or calcified  gallstones. No biliary duct dilation. Pancreas: There is a 3.6 x 3.6 cm mass centered within the uncinate process of the pancreas, reference image 24/2, consistent with malignancy. There is upstream pancreatic atrophy and irregular pancreatic duct dilation. No acute inflammatory changes. Spleen: Normal in size without focal abnormality. Adrenals/Urinary Tract: The adrenals are unremarkable. There is a 5.1 x 4.6 x 5.3 cm heterogeneously enhancing mass within the anterior aspect of the right kidney, compatible with renal cell carcinoma. No evidence of extension into the renal pelvis or vascular involvement. Simple cyst upper pole left kidney does not require follow-up. Otherwise the left kidney is unremarkable. The bladder is moderately distended, without filling defect. Stomach/Bowel: No bowel obstruction or ileus. Normal appendix right lower quadrant. No bowel wall thickening or inflammatory change. Vascular/Lymphatic: 3.1 cm infrarenal abdominal aortic aneurysm. Diffuse aortic atherosclerosis. Multiple enlarged lymph nodes are seen within the porta hepatis, consistent with nodal metastases. Largest lymph node reference image 18/2 measures 14 mm in short axis. Reproductive: Prior hysterectomy.  No adnexal masses. Other: Trace pelvic free fluid. No free intraperitoneal gas. No abdominal wall hernia. Musculoskeletal: There are no acute or destructive bony abnormalities. Reconstructed images demonstrate no additional findings. IMPRESSION: 1. 3.6 cm mass within the uncinate process of the pancreas, consistent with malignancy. Upstream pancreatic atrophy and irregular pancreatic duct dilation. Further evaluation with EUS may be useful. 2. Multiple hypodense masses within the liver, largest in the left lobe, consistent with hepatic metastases. 3. 5.3 cm heterogeneously enhancing right renal mass, consistent with renal cell carcinoma. 4. Pathologic adenopathy within the porta hepatis, consistent with nodal metastases. 5.  Trace pelvic free fluid. 6. 3.1 cm infrarenal abdominal aortic aneurysm. Recommend follow-up ultrasound every 3 years. (Ref.: J Vasc Surg. 2018; 67:2-77 and J Am Coll Radiol 2013;10(10):789-794.) 7.  Aortic Atherosclerosis (ICD10-I70.0). Electronically Signed   By: Sharlet Salina M.D.   On: 02/10/2024 17:48    Labs:  CBC: Recent Labs    08/09/23 1032 02/09/24 1300 02/10/24 1455  WBC 6.5 8.7 10.3  HGB 14.4 12.3 12.0  HCT 41.3 37.6 36.0  PLT 245 445 434*    COAGS: No results for input(s): "INR", "APTT" in the last 8760 hours.  BMP: Recent Labs    08/09/23 1032 02/10/24 1455  NA 128* 130*  K 4.1 3.5  CL 86* 95*  CO2 25 24  GLUCOSE 435* 132*  BUN 8 8  CALCIUM 9.7 8.8*  CREATININE 0.71 0.65  GFRNONAA  --  >60    LIVER FUNCTION TESTS: Recent Labs    08/09/23 1032 02/10/24 1455  BILITOT 0.4 0.5  AST 27 33  ALT 27 17  ALKPHOS 90 82  PROT 7.1 7.4  ALBUMIN 4.1 3.0*     Assessment and Plan: Liver lesions Pancreatic mass For US guided biopsy. Labs reviewed. Risks and benefits of liver biopsy was discussed with the patient and/or patient's family including, but not limited to bleeding, infection, damage to adjacent structures or low yield requiring additional tests.  All of the questions were answered and there is agreement to proceed.  Consent signed and in chart.    Electronically Signed: Brayton El, PA-C 02/20/2024, 8:22 AM   I spent a total of 20 minutes in face to face in clinical consultation, greater than 50% of which was counseling/coordinating care for liver bx

## 2024-02-20 NOTE — Procedures (Signed)
Interventional Radiology Procedure Note  Procedure: US Guided Biopsy of liver lesion  Complications: None  Estimated Blood Loss: < 10 mL  Findings: 18 G core biopsy of left lobe liver lesion performed under US guidance.  Three core samples obtained and sent to Pathology.  Venetia Night. Kathlene Cote, M.D Pager:  [phone]

## 2024-02-21 ENCOUNTER — Ambulatory Visit: Payer: PPO

## 2024-02-22 LAB — SURGICAL PATHOLOGY

## 2024-02-23 ENCOUNTER — Other Ambulatory Visit: Payer: Self-pay

## 2024-02-23 ENCOUNTER — Telehealth: Payer: Self-pay

## 2024-02-23 ENCOUNTER — Inpatient Hospital Stay: Payer: PPO

## 2024-02-23 ENCOUNTER — Inpatient Hospital Stay: Payer: PPO | Attending: Oncology | Admitting: Oncology

## 2024-02-23 ENCOUNTER — Encounter: Payer: Self-pay | Admitting: Oncology

## 2024-02-23 VITALS — BP 149/61 | HR 66 | Temp 98.0°F | Ht 63.0 in | Wt 132.0 lb

## 2024-02-23 DIAGNOSIS — N2889 Other specified disorders of kidney and ureter: Secondary | ICD-10-CM | POA: Insufficient documentation

## 2024-02-23 DIAGNOSIS — Z5111 Encounter for antineoplastic chemotherapy: Secondary | ICD-10-CM | POA: Diagnosis not present

## 2024-02-23 DIAGNOSIS — Z794 Long term (current) use of insulin: Secondary | ICD-10-CM | POA: Insufficient documentation

## 2024-02-23 DIAGNOSIS — I700 Atherosclerosis of aorta: Secondary | ICD-10-CM | POA: Insufficient documentation

## 2024-02-23 DIAGNOSIS — D72829 Elevated white blood cell count, unspecified: Secondary | ICD-10-CM | POA: Insufficient documentation

## 2024-02-23 DIAGNOSIS — I7143 Infrarenal abdominal aortic aneurysm, without rupture: Secondary | ICD-10-CM | POA: Insufficient documentation

## 2024-02-23 DIAGNOSIS — E559 Vitamin D deficiency, unspecified: Secondary | ICD-10-CM | POA: Diagnosis not present

## 2024-02-23 DIAGNOSIS — K219 Gastro-esophageal reflux disease without esophagitis: Secondary | ICD-10-CM | POA: Insufficient documentation

## 2024-02-23 DIAGNOSIS — Z853 Personal history of malignant neoplasm of breast: Secondary | ICD-10-CM | POA: Diagnosis not present

## 2024-02-23 DIAGNOSIS — I959 Hypotension, unspecified: Secondary | ICD-10-CM | POA: Diagnosis not present

## 2024-02-23 DIAGNOSIS — I10 Essential (primary) hypertension: Secondary | ICD-10-CM | POA: Diagnosis not present

## 2024-02-23 DIAGNOSIS — F17210 Nicotine dependence, cigarettes, uncomplicated: Secondary | ICD-10-CM | POA: Diagnosis not present

## 2024-02-23 DIAGNOSIS — Z803 Family history of malignant neoplasm of breast: Secondary | ICD-10-CM | POA: Diagnosis not present

## 2024-02-23 DIAGNOSIS — C787 Secondary malignant neoplasm of liver and intrahepatic bile duct: Secondary | ICD-10-CM

## 2024-02-23 DIAGNOSIS — C259 Malignant neoplasm of pancreas, unspecified: Secondary | ICD-10-CM

## 2024-02-23 DIAGNOSIS — Z7189 Other specified counseling: Secondary | ICD-10-CM | POA: Diagnosis not present

## 2024-02-23 DIAGNOSIS — E119 Type 2 diabetes mellitus without complications: Secondary | ICD-10-CM | POA: Insufficient documentation

## 2024-02-23 DIAGNOSIS — E785 Hyperlipidemia, unspecified: Secondary | ICD-10-CM | POA: Insufficient documentation

## 2024-02-23 DIAGNOSIS — G893 Neoplasm related pain (acute) (chronic): Secondary | ICD-10-CM | POA: Diagnosis not present

## 2024-02-23 DIAGNOSIS — Z79899 Other long term (current) drug therapy: Secondary | ICD-10-CM | POA: Diagnosis not present

## 2024-02-23 DIAGNOSIS — R110 Nausea: Secondary | ICD-10-CM | POA: Insufficient documentation

## 2024-02-23 DIAGNOSIS — C257 Malignant neoplasm of other parts of pancreas: Secondary | ICD-10-CM | POA: Diagnosis not present

## 2024-02-23 DIAGNOSIS — E039 Hypothyroidism, unspecified: Secondary | ICD-10-CM | POA: Insufficient documentation

## 2024-02-23 DIAGNOSIS — I3481 Nonrheumatic mitral (valve) annulus calcification: Secondary | ICD-10-CM | POA: Diagnosis not present

## 2024-02-23 DIAGNOSIS — Z7982 Long term (current) use of aspirin: Secondary | ICD-10-CM | POA: Diagnosis not present

## 2024-02-23 DIAGNOSIS — R011 Cardiac murmur, unspecified: Secondary | ICD-10-CM | POA: Diagnosis not present

## 2024-02-23 MED ORDER — LIDOCAINE-PRILOCAINE 2.5-2.5 % EX CREA: TOPICAL_CREAM | CUTANEOUS | 3 refills | Status: DC

## 2024-02-23 MED ORDER — MIRTAZAPINE 15 MG PO TABS: 15.0000 mg | ORAL_TABLET | Freq: Every day | ORAL | 0 refills | Status: DC

## 2024-02-23 MED ORDER — ONDANSETRON HCL 8 MG PO TABS: 8.0000 mg | ORAL_TABLET | Freq: Three times a day (TID) | ORAL | 1 refills | Status: DC | PRN

## 2024-02-23 MED ORDER — PROCHLORPERAZINE MALEATE 10 MG PO TABS: 10.0000 mg | ORAL_TABLET | Freq: Four times a day (QID) | ORAL | 1 refills | Status: DC | PRN

## 2024-02-23 MED ORDER — LOPERAMIDE HCL 2 MG PO CAPS: ORAL_CAPSULE | ORAL | 3 refills | Status: DC

## 2024-02-23 MED ORDER — OXYCODONE HCL 5 MG PO TABS: 5.0000 mg | ORAL_TABLET | ORAL | 0 refills | Status: DC | PRN

## 2024-02-23 MED ORDER — DEXAMETHASONE 4 MG PO TABS: 8.0000 mg | ORAL_TABLET | Freq: Every day | ORAL | 1 refills | Status: DC

## 2024-02-23 NOTE — Telephone Encounter (Signed)
 Called and notified Heather Burke with port instructions.  Port a cath placement scheduled for February 28, 2024  Arrive at 1300  for 1400 appointment Come into the Heart and Vascular entrance at [facility]. This entrance is located in the front of the hospital. Do not eat or drink anything after midnight Take your regularly scheduled blood pressure, pain, or seizure medication. You do not need to hold you blood thinners. You will need a driver for this procedure.   [facility] NGS ordered on specimen [id number], liver

## 2024-02-23 NOTE — Telephone Encounter (Signed)
 Cover my meds request sent for EMLA cream  (Key: [id number])  Notification: Your information has been sent to [employer].

## 2024-02-23 NOTE — Patient Instructions (Signed)
 Port a cath placement scheduled for (pending)  Arrive at (pending)  for (pending) appointment Come into the Heart and Vascular entrance at [facility]. This entrance is located in the front of the hospital. Do not eat or drink anything after midnight Take your regularly scheduled blood pressure, pain, or seizure medication. You do not need to hold you blood thinners. You will need a driver for this procedure.

## 2024-02-23 NOTE — Progress Notes (Signed)
START ON PATHWAY REGIMEN - Pancreatic Adenocarcinoma     A cycle is every 14 days:     Irinotecan      Oxaliplatin      Leucovorin      Fluorouracil   **Always confirm dose/schedule in your pharmacy ordering system**  Patient Characteristics: Metastatic Disease, First Line, PS = 0,1, BRCA1/2 and PALB2  Mutation Absent/Unknown Therapeutic Status: Metastatic Disease Line of Therapy: First Line ECOG Performance Status: 1 BRCA1/2 Mutation Status: Awaiting Test Results PALB2 Mutation Status: Awaiting Test Results Intent of Therapy: Non-Curative / Palliative Intent, Discussed with Patient 

## 2024-02-23 NOTE — Telephone Encounter (Signed)
 New patient with Dr. Aricela Bertagnolli Robert today and Delice Bison request a Group Disability Insurance from from [employer] to be completed.

## 2024-02-23 NOTE — Telephone Encounter (Signed)
 Patient's daughter Delice Bison is Requesting intermittent FMLA.  The employee portion of form is not completed.  Talked with Delice Bison to explain that we need the employee port complete before we an submit form.  Delice Bison says she will be back next week and will sign at that time.    Incomplete form downstairs for Delice Bison with note to return to me for submission.

## 2024-02-24 ENCOUNTER — Other Ambulatory Visit: Payer: Self-pay

## 2024-02-26 ENCOUNTER — Telehealth: Payer: Self-pay

## 2024-02-26 ENCOUNTER — Inpatient Hospital Stay

## 2024-02-26 NOTE — Telephone Encounter (Signed)
 Received call from [name]. During her initial meeting with Dr. Smith Robert she did not mention her fever and night sweats. She had been running a fever as high as 101.6. She was advised not to take ibuprofen and tylenol during her consult and now she feels horrible with the fever. Spoke with NP, Consuello Masse who will call her to discuss.

## 2024-02-26 NOTE — Progress Notes (Signed)
 [facility] Clinical Social Work  Clinical Social Work was referred by medical provider, Dr. Smith Robert, for assessment of psychosocial needs.  Clinical Social Worker contacted patient by phone to offer support and assess for needs.    Patient was unable to discuss her cancer at the moment and requested to speak at another time.  We are scheduled for 12:00pm tomorrow, 3/11.     Dorothey Baseman, LCSW  Clinical Social Worker [facility]

## 2024-02-26 NOTE — Telephone Encounter (Signed)
 Filled expedited appeal via phone for EMLA cream will receive notification within 72 hours. Valeda Malm Ref # [id number]

## 2024-02-26 NOTE — Progress Notes (Signed)
 Pharmacist Chemotherapy Monitoring - Initial Assessment    Anticipated start date: 03/05/24   The following has been reviewed per standard work regarding the patient's treatment regimen: The patient's diagnosis, treatment plan and drug doses, and organ/hematologic function Lab orders and baseline tests specific to treatment regimen  The treatment plan start date, drug sequencing, and pre-medications Prior authorization status  Patient's documented medication list, including drug-drug interaction screen and prescriptions for anti-emetics and supportive care specific to the treatment regimen The drug concentrations, fluid compatibility, administration routes, and timing of the medications to be used The patient's access for treatment and lifetime cumulative dose history, if applicable  The patient's medication allergies and previous infusion related reactions, if applicable   Changes made to treatment plan:  N/A  Follow up needed:  N/A   Ebony Hail, Pharm.D., CPP 02/26/2024@4 :33 PM

## 2024-02-27 ENCOUNTER — Encounter: Payer: Self-pay | Admitting: Oncology

## 2024-02-27 ENCOUNTER — Inpatient Hospital Stay

## 2024-02-27 ENCOUNTER — Other Ambulatory Visit (HOSPITAL_COMMUNITY): Payer: Self-pay | Admitting: Student

## 2024-02-27 NOTE — Progress Notes (Signed)
 [facility] Clinical Social Work  Initial Assessment   Heather Burke is a 73 y.o. year old female contacted by phone. Clinical Social Work was referred by medical provider for assessment of psychosocial needs.   SDOH (Social Determinants of Health) assessments performed: Yes SDOH Interventions    Flowsheet Row Office Visit from 02/09/2024 in [facility] Clinical Support from 12/26/2023 in [facility] Clinical Support from 12/22/2022 in [facility] Chronic Care Management from 02/21/2022 in [facility] Clinical Support from 12/16/2021 in [facility] Chronic Care Management from 11/23/2021 in [facility]  SDOH Interventions        Food Insecurity Interventions -- Intervention Not Indicated Intervention Not Indicated -- Intervention Not Indicated --  Housing Interventions -- Intervention Not Indicated Intervention Not Indicated -- Intervention Not Indicated --  Transportation Interventions -- Intervention Not Indicated Intervention Not Indicated -- Intervention Not Indicated --  Utilities Interventions -- Intervention Not Indicated Intervention Not Indicated -- -- --  Alcohol Usage Interventions -- Intervention Not Indicated (Score <7) Intervention Not Indicated (Score <7) -- -- --  Depression Interventions/Treatment  Medication -- -- -- -- --  Financial Strain Interventions -- Intervention Not Indicated Intervention Not Indicated Intervention Not Indicated Intervention Not Indicated Intervention Not Indicated  Physical Activity Interventions -- Intervention Not Indicated Intervention Not Indicated -- Intervention Not Indicated --  Stress Interventions -- Intervention Not Indicated Intervention Not Indicated -- Intervention Not Indicated --  Social Connections Interventions -- Intervention Not Indicated Intervention Not Indicated -- Intervention Not  Indicated --  Health Literacy Interventions -- Intervention Not Indicated -- -- -- --       SDOH Screenings   Food Insecurity: No Food Insecurity (02/23/2024)  Housing: Low Risk  (02/23/2024)  Transportation Needs: No Transportation Needs (02/23/2024)  Utilities: Not At Risk (02/23/2024)  Alcohol Screen: Low Risk  (12/26/2023)  Depression (PHQ2-9): Low Risk  (02/23/2024)  Recent Concern: Depression (PHQ2-9) - High Risk (02/09/2024)  Financial Resource Strain: Low Risk  (12/26/2023)  Physical Activity: Insufficiently Active (12/26/2023)  Social Connections: Moderately Isolated (12/26/2023)  Stress: No Stress Concern Present (12/26/2023)  Tobacco Use: High Risk (02/23/2024)  Health Literacy: Adequate Health Literacy (12/26/2023)     Distress Screen completed: No     No data to display            Family/Social Information:  Housing Arrangement: patient lives alone.  Her sister, two daughters and a nephew all live very close to her. Family members/support persons in your life? Family.  She has two grandchildren and is very involved in their lives. Transportation concerns: no  Employment: Retired Income source: Actor concerns: No Type of concern: None Food access concerns: no Religious or spiritual practice: Yes-Patient prays. Advanced directives: No Services Currently in place:  [employer]  Coping/ Adjustment to diagnosis: Patient understands treatment plan and what happens next? yes Concerns about diagnosis and/or treatment: Feelings of anger or sadness, Overwhelmed by information, Afraid of cancer, and Quality of life Patient reported stressors: Depression, Anxiety/ nervousness, and Adjusting to my illness Hopes and/or priorities: Family Patient enjoys time with family/ friends Current coping skills/ strengths: Average or above average intelligence , Capable of independent living , Communication skills , Contractor , General fund of knowledge ,  and Supportive family/friends     SUMMARY: Current SDOH Barriers:  Mental Health Concerns   Clinical Social Work Clinical Goal(s):  Patient  will work with SW to address concerns related to anxiety and depression.  Interventions: Discussed common feeling and emotions when being diagnosed with cancer, and the importance of support during treatment Informed patient of the support team roles and support services at [facility] Provided CSW contact information and encouraged patient to call with any questions or concerns Provided patient with information about CSW role and the [facility].     Follow Up Plan: Patient will contact CSW with any support or resource needs Patient verbalizes understanding of plan: Yes    Dorothey Baseman, LCSW Clinical Social Worker [facility]

## 2024-02-27 NOTE — Progress Notes (Signed)
 Patient for IR Port Placement on Wed 02/28/24, I called and spoke with the patient on the phone and gave pre-procedure instructions. Pt was made aware to be here at 1p, NPO after MN prior to procedure as well as driver post procedure/recovery/discharge. Pt stated understanding.  Called 02/27/24

## 2024-02-27 NOTE — Telephone Encounter (Signed)
 Completed form faxed and sent to be scanned in chart.

## 2024-02-28 ENCOUNTER — Encounter: Payer: Self-pay | Admitting: Radiology

## 2024-02-28 ENCOUNTER — Other Ambulatory Visit: Payer: Self-pay

## 2024-02-28 ENCOUNTER — Ambulatory Visit
Admission: RE | Admit: 2024-02-28 | Discharge: 2024-02-28 | Disposition: A | Discharge status: Home / Self Care | Source: Ambulatory Visit | Attending: Oncology | Admitting: Radiology

## 2024-02-28 DIAGNOSIS — E039 Hypothyroidism, unspecified: Secondary | ICD-10-CM | POA: Diagnosis not present

## 2024-02-28 DIAGNOSIS — E785 Hyperlipidemia, unspecified: Secondary | ICD-10-CM | POA: Diagnosis not present

## 2024-02-28 DIAGNOSIS — Z853 Personal history of malignant neoplasm of breast: Secondary | ICD-10-CM | POA: Diagnosis not present

## 2024-02-28 DIAGNOSIS — C259 Malignant neoplasm of pancreas, unspecified: Secondary | ICD-10-CM | POA: Insufficient documentation

## 2024-02-28 DIAGNOSIS — F17210 Nicotine dependence, cigarettes, uncomplicated: Secondary | ICD-10-CM | POA: Diagnosis not present

## 2024-02-28 DIAGNOSIS — I10 Essential (primary) hypertension: Secondary | ICD-10-CM | POA: Diagnosis not present

## 2024-02-28 DIAGNOSIS — C787 Secondary malignant neoplasm of liver and intrahepatic bile duct: Secondary | ICD-10-CM | POA: Diagnosis not present

## 2024-02-28 HISTORY — PX: IR IMAGING GUIDED PORT INSERTION: IMG5740

## 2024-02-28 LAB — GLUCOSE, CAPILLARY: Glucose-Capillary: 80 mg/dL (ref 70–99)

## 2024-02-28 MED ORDER — LIDOCAINE-EPINEPHRINE 1 %-1:100000 IJ SOLN
INTRAMUSCULAR | Status: AC
  Filled 2024-02-28: qty 1

## 2024-02-28 MED ORDER — HEPARIN SOD (PORK) LOCK FLUSH 100 UNIT/ML IV SOLN
500.0000 [IU] | Freq: Once | INTRAVENOUS | Status: AC
  Administered 2024-02-28: 500 [IU] via INTRAVENOUS

## 2024-02-28 MED ORDER — FENTANYL CITRATE (PF) 100 MCG/2ML IJ SOLN
INTRAMUSCULAR | Status: AC
  Filled 2024-02-28: qty 2

## 2024-02-28 MED ORDER — MIDAZOLAM HCL 2 MG/2ML IJ SOLN
INTRAMUSCULAR | Status: AC | PRN
  Administered 2024-02-28: 1 mg via INTRAVENOUS

## 2024-02-28 MED ORDER — HEPARIN SOD (PORK) LOCK FLUSH 100 UNIT/ML IV SOLN
INTRAVENOUS | Status: AC
  Filled 2024-02-28: qty 5

## 2024-02-28 MED ORDER — MIDAZOLAM HCL 2 MG/2ML IJ SOLN
INTRAMUSCULAR | Status: AC
  Filled 2024-02-28: qty 4

## 2024-02-28 MED ORDER — FENTANYL CITRATE (PF) 100 MCG/2ML IJ SOLN
INTRAMUSCULAR | Status: AC | PRN
  Administered 2024-02-28: 50 ug via INTRAVENOUS

## 2024-02-28 MED ORDER — LIDOCAINE-EPINEPHRINE 1 %-1:100000 IJ SOLN
20.0000 mL | Freq: Once | INTRAMUSCULAR | Status: AC
  Administered 2024-02-28: 15 mL

## 2024-02-28 NOTE — H&P (Signed)
 Chief Complaint: Metastatic pancreatic carcinoma; chemotherapy initiation - image guided port a catheter placement   Referring Provider(s): Owens Shark   Supervising Physician: Malachy Moan  Patient Status: [facility] - Out-pt  History of Present Illness: Heather Burke is a 73 y.o. female with history of hyperlipidemia, hypertension, thyroid disease, breast cancer, and pancreatic cancer.  Pt is followed by Dr. Smith Robert of oncology and is known to [place] from recent liver lesion biopsy 02/20/24 with Dr. Fredia Sorrow.  The biopsy obtained confirmed metastatic pancreatic carcinoma and she was subsequently referred to [place] for image guided port a catheter placement.     Patient is Full Code  Past Medical History:  Diagnosis Date   Breast cancer (HCC) 06/08/2015   T1c, N0;  ER+,PR+, Her 2 neg (FISH neg) x2., SLN x 3 negative. Mammoprint: Low risk. Multifocal.Invasive mammary and lobular carcinoma.    Diabetes mellitus without complication (HCC)    GERD (gastroesophageal reflux disease)    Heart murmur 2016   newly diagnosed, slight murmur   Hyperlipidemia    Hypertension    Hypothyroidism    Lupus    PONV (postoperative nausea and vomiting)    with Hysterectomy   Thyroid disease     Past Surgical History:  Procedure Laterality Date   ABDOMINAL HYSTERECTOMY     BREAST BIOPSY Right 2002   negative/ Dr. Lemar Livings   BREAST BIOPSY Left 06-08-15   INVASIVE MAMMARY CARCINOMA WITH PARTIAL SOLID PATTERN.    BREAST BIOPSY Right 06/26/2018   FIBROADENOMATOUS CHANGES WITH STROMAL HYALINIZATION AND CALCIFICATIONS   COLONOSCOPY WITH PROPOFOL N/A 09/13/2016   Procedure: COLONOSCOPY WITH PROPOFOL;  Surgeon: Midge Minium, MD;  Location: [facility] ENDOSCOPY;  Service: Endoscopy;  Laterality: N/A;   core biopsy Left 06/08/15   GUM SURGERY  2006   MASTECTOMY Left 2016   OOPHORECTOMY     SENTINEL NODE BIOPSY Left 08/18/2015   Procedure: SENTINEL NODE BIOPSY;  Surgeon: Earline Mayotte, MD;  Location: [facility] ORS;   Service: General;  Laterality: Left;   SIMPLE MASTECTOMY WITH AXILLARY SENTINEL NODE BIOPSY Left 08/18/2015   Procedure: SIMPLE MASTECTOMY;  Surgeon: Earline Mayotte, MD;  Location: [facility] ORS;  Service: General;  Laterality: Left;   TONSILLECTOMY      Allergies: Metformin and related, Amlodipine, Atorvastatin, Lisinopril, Pravastatin, Icosapent ethyl (epa ethyl ester) (fish), and Nifedipine er  Medications: Prior to Admission medications   Medication Sig Start Date End Date Taking? Authorizing Provider  ACCU-CHEK GUIDE TEST test strip USE 1 STIP TO CHECK BLOOD SUGAR IN THE MORNING, AT NOON, AND AT BEDTIME 11/07/23   Jacky Kindle, FNP  Accu-Chek Softclix Lancets lancets 1 EACH BY DOES NOT APPLY ROUTE IN THE MORNING, AT NOON, AND AT BEDTIME 11/22/23   Jacky Kindle, FNP  aspirin 81 MG tablet Take 81 mg by mouth daily. Patient not taking: Reported on 02/23/2024    [provider]  Blood Glucose Monitoring Suppl DEVI 1 each by Does not apply route in the morning, at noon, and at bedtime. May substitute to any manufacturer covered by patient's insurance. 08/10/23   Jacky Kindle, FNP  Cholecalciferol 125 MCG (5000 UT) TABS Take 1 tablet by mouth daily.    [provider]  Continuous Glucose Sensor (FREESTYLE LIBRE 3 PLUS SENSOR) MISC Place 1 sensor on the skin every 15 days. Use to check glucose continuously 11/29/23   [name] T, FNP  dexamethasone (DECADRON) 4 MG tablet Take 2 tablets (8 mg total) by  mouth daily. Take 2 tablets daily x 3 days starting the day after chemotherapy. Take with food. 02/23/24   Creig Hines, MD  diltiazem (DILT-XR) 240 MG 24 hr capsule Take 1 capsule (240 mg total) by mouth daily. 02/09/24   Sallee Provencal, FNP  Dulaglutide (TRULICITY) 1.5 MG/0.5ML SOAJ Inject 1.5 mg into the skin once a week. 02/09/24   Sallee Provencal, FNP  fenofibrate (TRICOR) 145 MG tablet Take 1 tablet (145 mg total) by mouth daily. 08/10/23   Jacky Kindle, FNP   fluticasone [facility]) 50 MCG/ACT nasal spray SPRAY 2 SPRAYS INTO EACH NOSTRIL EVERY DAY 11/22/23   Jacky Kindle, FNP  icosapent Ethyl (VASCEPA) 1 g capsule Take 2 capsules (2 g total) by mouth 2 (two) times daily. Patient not taking: Reported on 02/09/2024 01/30/24   Charlcie Cradle A, FNP  insulin glargine (LANTUS) 100 UNIT/ML Solostar Pen Inject 40 Units into the skin at bedtime. Titrate by 2 units every 3 days for fasting morning blood sugar >150. Patient taking differently: Inject 28 Units into the skin at bedtime. Titrate by 2 units every 3 days for fasting morning blood sugar >150. 11/09/23   [name] T, FNP  Insulin Pen Needle (PEN NEEDLES 31GX5/16") 31G X 8 MM MISC 1 each by Does not apply route daily. 08/10/23   Jacky Kindle, FNP  latanoprost (XALATAN) 0.005 % ophthalmic solution 1 drop at bedtime. Patient not taking: Reported on 02/23/2024 07/05/22   [provider]  levothyroxine (SYNTHROID) 88 MCG tablet Take 1 tablet (88 mcg total) by mouth daily. 02/09/24   Sallee Provencal, FNP  lidocaine-prilocaine (EMLA) cream Apply to affected area once 02/23/24   Creig Hines, MD  loperamide (IMODIUM) 2 MG capsule Take 2 tabs by mouth with first loose stool, then 1 tab with each additional loose stool as needed. Do not exceed 8 tabs in a 24-hour period 02/23/24   Creig Hines, MD  metoprolol (TOPROL-XL) 200 MG 24 hr tablet Take 1 tablet (200 mg total) by mouth daily. 02/09/24   Sallee Provencal, FNP  mirtazapine (REMERON) 15 MG tablet Take 1 tablet (15 mg total) by mouth at bedtime. 02/23/24   Creig Hines, MD  Misc Natural Products (T-RELIEF CBD+13 SL) Place 500 mg under the tongue daily. Patient not taking: Reported on 02/20/2024    [provider]  Nutritional Supplements (QUINOA/KALE/HEMP PO) Place 750 mg under the tongue. Patient not taking: Reported on 02/20/2024    [provider]  ondansetron (ZOFRAN) 8 MG tablet Take 1 tablet (8 mg total) by mouth every 8 (eight)  hours as needed for nausea or vomiting. Start on the third day after chemotherapy 02/23/24   Creig Hines, MD  oxyCODONE (OXY IR/ROXICODONE) 5 MG immediate release tablet Take 1 tablet (5 mg total) by mouth every 4 (four) hours as needed for severe pain (pain score 7-10). 02/23/24   Creig Hines, MD  potassium chloride (KLOR-CON M10) 10 MEQ tablet Take 1 tablet (10 mEq total) by mouth daily. 02/09/24   Sallee Provencal, FNP  prochlorperazine (COMPAZINE) 10 MG tablet Take 1 tablet (10 mg total) by mouth every 6 (six) hours as needed for nausea or vomiting (Nausea or vomiting). 02/23/24   Creig Hines, MD  valsartan-hydrochlorothiazide (DIOVAN-HCT) 320-12.5 MG tablet Take 1 tablet by mouth daily. 08/09/23   Jacky Kindle, FNP  VYZULTA 0.024 % SOLN Apply to eye. 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  Tobacco Use   Smoking status: Every Day    Current packs/day: 2.00    Average packs/day: 2.0 packs/day for 49.0 years (98.0 ttl pk-yrs)    Types: Cigarettes   Smokeless tobacco: Never  Vaping Use   Vaping status: Never Used  Substance and Sexual Activity   Alcohol use: Yes    Alcohol/week: 14.0 - 28.0 standard drinks of alcohol    Types: 14 - 28 Cans of beer per week    Comment: 2-3 beers a day - declined cutting back anymore   Drug use: No   Sexual activity: Not on file  Other Topics Concern   Not on file  Social History Narrative   Not on file   Social Drivers of Health   Financial Resource Strain:  Low Risk  (12/26/2023)   Overall Financial Resource Strain (CARDIA)    Difficulty of Paying Living Expenses: Not hard at all  Food Insecurity: No Food Insecurity (02/23/2024)   Hunger Vital Sign    Worried About Running Out of Food in the Last Year: Never true    Ran Out of Food in the Last Year: Never true  Transportation Needs: No Transportation Needs (02/23/2024)   PRAPARE - Administrator, Civil Service (Medical): No    Lack of Transportation (Non-Medical): No  Physical Activity: Insufficiently Active (12/26/2023)   Exercise Vital Sign    Days of Exercise per Week: 4 days    Minutes of Exercise per Session: 30 min  Stress: No Stress Concern Present (12/26/2023)   [employer] of Occupational Health - Occupational Stress Questionnaire    Feeling of Stress : Not at all  Social Connections: Moderately Isolated (12/26/2023)   Social Connection and Isolation Panel [NHANES]    Frequency of Communication with Friends and Family: More than three times a week    Frequency of Social Gatherings with Friends and Family: More than three times a week    Attends Religious Services: 1 to 4 times per year    Active Member of [employer] or Organizations: No    Attends Banker Meetings: Never    Marital Status: Widowed     Review of Systems: A 12 point ROS discussed and pertinent positives are indicated in the HPI above.  All other systems are negative.  Review of Systems  Constitutional:  Negative for chills, fatigue and fever.  Respiratory:  Negative for cough, shortness of breath and wheezing.   Gastrointestinal:  Negative for diarrhea, nausea and vomiting.  Neurological:  Negative for dizziness and headaches.  Psychiatric/Behavioral:  Negative for agitation, behavioral problems and confusion.     Vital Signs: There were no vitals taken for this visit.  Advance Care Plan: The advanced care place/surrogate decision maker was discussed at the time of visit and the patient did  not wish to discuss or was  not able to name a surrogate decision maker or provide an advance care plan.  Physical Exam Constitutional:      Appearance: She is well-developed.  HENT:     Head: Atraumatic.     Mouth/Throat:     Mouth: Mucous membranes are moist.  Cardiovascular:     Rate and Rhythm: Normal rate and regular rhythm.     Heart sounds: No murmur heard. Pulmonary:     Effort: Pulmonary effort is normal.     Breath sounds: Normal breath sounds.  Abdominal:     General: Bowel sounds are normal.     Palpations: Abdomen is soft.  Musculoskeletal:        General: Normal range of motion.  Skin:    General: Skin is warm.  Neurological:     Mental Status: She is alert and oriented to person, place, and time.  Psychiatric:        Mood and Affect: Mood normal.        Behavior: Behavior normal.     Imaging: NM PET Image Initial (PI) Skull Base To Thigh Result Date: 02/23/2024 CLINICAL DATA:  Initial treatment strategy for pancreatic carcinoma. EXAM: NUCLEAR MEDICINE PET SKULL BASE TO THIGH TECHNIQUE: 7.1 mCi F-18 FDG was injected intravenously. Full-ring PET imaging was performed from the skull base to thigh after the radiotracer. CT data was obtained and used for attenuation correction and anatomic localization. Fasting blood glucose: 75 mg/dl COMPARISON:  CT on 16/09/9603 FINDINGS: Mediastinal blood-pool activity (background): SUV max = 1.9 Liver activity (reference): SUV max = N/A NECK:  No hypermetabolic lymph nodes or masses. Incidental CT findings:  None. CHEST: 5 mm left internal mammary chain lymph node is hypermetabolic, with SUV max of 4.3. 5 mm precardiac mediastinal lymph node is also hypermetabolic, with SUV max of 4.0. No suspicious pulmonary nodules seen on CT images. Incidental CT findings:  None. ABDOMEN/PELVIS: 3.8 x 3.6 cm mass in the pancreatic head shows intense hypermetabolism, with SUV max of 15.4, consistent with primary pancreatic carcinoma. Hypermetabolic  lymphadenopathy is seen in the adjacent peripancreatic retroperitoneum, gastrohepatic ligament, porta hepatis, and portacaval space, with 1 index lymph node in the gastrohepatic ligament measuring 2.1 cm with SUV max of 14.2. Numerous hypermetabolic metastases are seen throughout liver, largest occupying nearly the entire lateral segment of the left hepatic lobe this has SUV max of 14.2. A solid mass is seen in the anterior midpole of the right kidney measuring 4.7 x 4.4 cm. This shows mild FDG uptake compared to other disease, with SUV max of 2.9. This favors primary renal cell carcinoma over renal metastasis. No hypermetabolic masses or lymphadenopathy identified within pelvis. Incidental CT findings: Prior hysterectomy. Small amount of free fluid in pelvis. Colonic diverticulosis, without signs of diverticulitis. SKELETON: No focal hypermetabolic bone lesions to suggest skeletal metastasis. Incidental CT findings:  None. IMPRESSION: 3.8 cm hypermetabolic mass in pancreatic head, consistent with primary pancreatic carcinoma. Hypermetabolic abdominal lymphadenopathy, consistent with metastatic disease. Diffuse hypermetabolic liver metastases. 5 mm hypermetabolic left internal mammary and precardiac lymph nodes, consistent with metastatic disease. 4.7 cm solid mass in anterior midpole of right kidney, which shows mild FDG uptake. This favors primary renal cell carcinoma over renal metastasis. Electronically Signed   By: Danae Orleans M.D.   On: 02/23/2024 08:35   US BIOPSY (LIVER) Result Date: 02/20/2024 INDICATION: Multiple liver lesions, mass of the head of the pancreas, right renal mass and prior history of breast carcinoma. Clinical suspicion of metastatic pancreatic carcinoma based  on recent imaging. Presenting for liver lesion biopsy. EXAM: ULTRASOUND GUIDED CORE BIOPSY OF LIVER MASS MEDICATIONS: None. ANESTHESIA/SEDATION: Moderate (conscious) sedation was employed during this procedure. A total of Versed 1.0  mg and Fentanyl 50 mcg was administered intravenously. Moderate Sedation Time: 13 minutes. The patient's level of consciousness and vital signs were monitored continuously by radiology nursing throughout the procedure under my direct supervision. PROCEDURE: The procedure, risks, benefits, and alternatives were explained to the patient. Questions regarding the procedure were encouraged and answered. The patient understands and consents to the procedure. A time-out was performed prior to initiating the procedure. The abdominal wall was prepped with chlorhexidine in a sterile fashion, and a sterile drape was applied covering the operative field. A sterile gown and sterile gloves were used for the procedure. Local anesthesia was provided with 1% Lidocaine. Ultrasound was used to localize a lesion within the left lobe of the liver. Under ultrasound guidance, a 17 gauge trocar needle was advanced into the lesion. Three separate coaxial 18 gauge core biopsy samples were obtained and submitted in formalin. A slurry of Gel-Foam EmboCubes was then injected as the trocar needle was retracted and removed. Additional ultrasound imaging was performed. COMPLICATIONS: None immediate. FINDINGS: Confluent tumor in the left lobe of the liver was localized measuring nearly 9 cm in maximum diameter. Solid tissue was obtained. IMPRESSION: Ultrasound-guided core biopsy performed of tumor within the left lobe of the liver. Electronically Signed   By: Irish Lack M.D.   On: 02/20/2024 10:00   CT ABDOMEN PELVIS W CONTRAST Result Date: 02/10/2024 CLINICAL DATA:  Left lower quadrant abdominal pain EXAM: CT ABDOMEN AND PELVIS WITH CONTRAST TECHNIQUE: Multidetector CT imaging of the abdomen and pelvis was performed using the standard protocol following bolus administration of intravenous contrast. RADIATION DOSE REDUCTION: This exam was performed according to the departmental dose-optimization program which includes automated exposure  control, adjustment of the mA and/or kV according to patient size and/or use of iterative reconstruction technique. CONTRAST:  75mL OMNIPAQUE IOHEXOL 350 MG/ML SOLN COMPARISON:  None Available. FINDINGS: Lower chest: No acute pleural or parenchymal lung disease. Dense calcification of the mitral valve. Hepatobiliary: There are multiple hypodense lesions within the liver, concerning for metastatic disease. The 2 largest lesions are seen within the left lobe, measuring 4.0 x 3.7 cm and 3.5 x 2.9 cm, reference images 12 and 13 of series 2. Gallbladder is decompressed, with nonspecific gallbladder wall thickening. No pericholecystic fluid or calcified gallstones. No biliary duct dilation. Pancreas: There is a 3.6 x 3.6 cm mass centered within the uncinate process of the pancreas, reference image 24/2, consistent with malignancy. There is upstream pancreatic atrophy and irregular pancreatic duct dilation. No acute inflammatory changes. Spleen: Normal in size without focal abnormality. Adrenals/Urinary Tract: The adrenals are unremarkable. There is a 5.1 x 4.6 x 5.3 cm heterogeneously enhancing mass within the anterior aspect of the right kidney, compatible with renal cell carcinoma. No evidence of extension into the renal pelvis or vascular involvement. Simple cyst upper pole left kidney does not require follow-up. Otherwise the left kidney is unremarkable. The bladder is moderately distended, without filling defect. Stomach/Bowel: No bowel obstruction or ileus. Normal appendix right lower quadrant. No bowel wall thickening or inflammatory change. Vascular/Lymphatic: 3.1 cm infrarenal abdominal aortic aneurysm. Diffuse aortic atherosclerosis. Multiple enlarged lymph nodes are seen within the porta hepatis, consistent with nodal metastases. Largest lymph node reference image 18/2 measures 14 mm in short axis. Reproductive: Prior hysterectomy.  No adnexal masses. Other: Trace  pelvic free fluid. No free intraperitoneal gas.  No abdominal wall hernia. Musculoskeletal: There are no acute or destructive bony abnormalities. Reconstructed images demonstrate no additional findings. IMPRESSION: 1. 3.6 cm mass within the uncinate process of the pancreas, consistent with malignancy. Upstream pancreatic atrophy and irregular pancreatic duct dilation. Further evaluation with EUS may be useful. 2. Multiple hypodense masses within the liver, largest in the left lobe, consistent with hepatic metastases. 3. 5.3 cm heterogeneously enhancing right renal mass, consistent with renal cell carcinoma. 4. Pathologic adenopathy within the porta hepatis, consistent with nodal metastases. 5. Trace pelvic free fluid. 6. 3.1 cm infrarenal abdominal aortic aneurysm. Recommend follow-up ultrasound every 3 years. (Ref.: J Vasc Surg. 2018; 67:2-77 and J Am Coll Radiol 2013;10(10):789-794.) 7.  Aortic Atherosclerosis (ICD10-I70.0). Electronically Signed   By: Sharlet Salina M.D.   On: 02/10/2024 17:48    Labs:  CBC: Recent Labs    08/09/23 1032 02/09/24 1300 02/10/24 1455 02/20/24 0758  WBC 6.5 8.7 10.3 11.8*  HGB 14.4 12.3 12.0 12.9  HCT 41.3 37.6 36.0 38.1  PLT 245 445 434* 447*    COAGS: Recent Labs    02/20/24 0758  INR 1.5*    BMP: Recent Labs    08/09/23 1032 02/10/24 1455  NA 128* 130*  K 4.1 3.5  CL 86* 95*  CO2 25 24  GLUCOSE 435* 132*  BUN 8 8  CALCIUM 9.7 8.8*  CREATININE 0.71 0.65  GFRNONAA  --  >60    LIVER FUNCTION TESTS: Recent Labs    08/09/23 1032 02/10/24 1455  BILITOT 0.4 0.5  AST 27 33  ALT 27 17  ALKPHOS 90 82  PROT 7.1 7.4  ALBUMIN 4.1 3.0*    TUMOR MARKERS: No results for input(s): "AFPTM", "CEA", "CA199", "CHROMGRNA" in the last 8760 hours.  Assessment and Plan:  Pt with metastatic pancreatic carcinoma and chemotherapy initiation scheduled for image guided port a catheter placement 02/28/24.    Risks and benefits of image guided port-a-catheter placement was discussed with the patient  including, but not limited to bleeding, infection, pneumothorax, or fibrin sheath development and need for additional procedures.  All of the patient's questions were answered, patient is agreeable to proceed. Consent signed and in chart.  Thank you for allowing our service to participate in REDITH DRACH 's care.  Electronically Signed: Loman Brooklyn, PA-C   02/28/2024, 12:51 PM    I spent a total of    25 Minutes in face to face in clinical consultation, greater than 50% of which was counseling/coordinating care for image guided port a catheter placement.

## 2024-02-28 NOTE — Procedures (Signed)
Interventional Radiology Procedure Note  Procedure: Placement of a right IJ approach single lumen PowerPort.  Tip is positioned at the superior cavoatrial junction and catheter is ready for immediate use.  Complications: No immediate Recommendations:  - Ok to shower tomorrow - Do not submerge for 7 days - Routine line care   Signed,  Obi Scrima K. Olanda Boughner, MD   

## 2024-03-01 ENCOUNTER — Encounter: Payer: Self-pay | Admitting: Pharmacist

## 2024-03-01 ENCOUNTER — Other Ambulatory Visit: Payer: Self-pay | Admitting: Pharmacist

## 2024-03-01 ENCOUNTER — Inpatient Hospital Stay

## 2024-03-01 DIAGNOSIS — E1129 Type 2 diabetes mellitus with other diabetic kidney complication: Secondary | ICD-10-CM

## 2024-03-01 NOTE — Patient Instructions (Signed)
 Goals Addressed             This Visit's Progress    Pharmacy Goals       Please remember that you will need to apply a new Freestyle Libre 3 Plus sensor every 15 days   Also, you can reach out to the [employer] Support at [phone]. Their customer support is available 7 days a week 8 am to 8 pm.   The goal A1c is less than 7%. This is the best way to reduce the risk of the long term complications of diabetes, including heart disease, kidney disease, eye disease, strokes, and nerve damage. An A1c of less than 7% corresponds with fasting sugars less than 130 and 2 hour after meal sugars less than 180. Please check your blood sugar twice daily.   Estelle Grumbles, PharmD, [facility] [phone]

## 2024-03-01 NOTE — Progress Notes (Signed)
 03/01/2024 Name: Heather Burke MRN: [id number] DOB: [birth year]  Chief Complaint  Patient presents with   Medication Management    Heather Burke is a 73 y.o. year old female who presented for a telephone visit.   They were referred to the pharmacist by their PCP for assistance in managing diabetes and medication access.    Subjective:  Care Team: Primary Care Provider: Sallee Provencal, FNP Oncologist: Creig Hines, MD; Next Scheduled Visit: 03/05/2024  Medication Access/Adherence  Current Pharmacy:  [facility] [phone] Nicholes Rough, [place] - [address] [place] [address] Phone: [phone] Fax: [phone]  [facility] - [place], [place] - [address] [place] [address] Phone: [phone] Fax: [phone]   Patient reports affordability concerns with their medications: No  Patient reports access/transportation concerns to their pharmacy: No  Patient reports adherence concerns with their medications:  No     Uses weekly pillbox   Collaborated with patient's Oncology team, Endocrinology specialty as well as PCP regarding diabetes medication management for patient given findings of liver lesions and a pancreatic mass based on CT results on 02/10/2024 - Note patient now established with Dr. Smith Robert with [facility] for related to carcinoma of pancreas    Diabetes:   Current medications:  - Trulicity 1.5 mg weekly on Saturdays - Lantus 18 units each morning (reports self titrated down by 2 units every 7 days when fasting blood sugar readings consistently <120)   Previous therapies tried: metformin (unable to tolerate due to GI side effects)     Reports started using Freestyle Libre 3 Plus sensors. Review data per LibreView today   Date of Download: 03/01/24 % Time CGM is active: 97% Average Glucose: 125 mg/dL Glucose Management Indicator: 6.3%  Glucose  Variability: 28.2 (goal <36%) Time in Goal:  - Time in range 70-180: 91% - Time above range: 9% - Time below range: 0%     Note patient carries glucose tablets with her to use if needed for signs of hypoglycemia    Current medication access support: Enrolled in [employer] patient assistance program for Trulicity through 12/18/2024     Objective:  Lab Results  Component Value Date   HGBA1C 6.9 (H) 02/09/2024    Lab Results  Component Value Date   CREATININE 0.65 02/10/2024   BUN 8 02/10/2024   NA 130 (L) 02/10/2024   K 3.5 02/10/2024   CL 95 (L) 02/10/2024   CO2 24 02/10/2024    Lab Results  Component Value Date   CHOL 79 (L) 02/09/2024   HDL 22 (L) 02/09/2024   LDLCALC 39 02/09/2024   TRIG 89 02/09/2024   CHOLHDL 3.6 02/09/2024    Current Outpatient Medications on File Prior to Visit  Medication Sig Dispense Refill   ACCU-CHEK GUIDE TEST test strip USE 1 STIP TO CHECK BLOOD SUGAR IN THE MORNING, AT NOON, AND AT BEDTIME 100 strip 0   Accu-Chek Softclix Lancets lancets 1 EACH BY DOES NOT APPLY ROUTE IN THE MORNING, AT NOON, AND AT BEDTIME 100 each 0   aspirin 81 MG tablet Take 81 mg by mouth daily. (Patient not taking: Reported on 02/23/2024)     Blood Glucose Monitoring Suppl DEVI 1 each by Does not apply route in the morning, at noon, and at bedtime. May substitute to any manufacturer covered by patient's insurance. 1 each 0   Cholecalciferol 125 MCG (5000 UT) TABS Take 1  tablet by mouth daily.     Continuous Glucose Sensor (FREESTYLE LIBRE 3 PLUS SENSOR) MISC Place 1 sensor on the skin every 15 days. Use to check glucose continuously 2 each 12   dexamethasone (DECADRON) 4 MG tablet Take 2 tablets (8 mg total) by mouth daily. Take 2 tablets daily x 3 days starting the day after chemotherapy. Take with food. 30 tablet 1   diltiazem (DILT-XR) 240 MG 24 hr capsule Take 1 capsule (240 mg total) by mouth daily. 90 capsule 1   Dulaglutide (TRULICITY) 1.5 MG/0.5ML SOAJ Inject 1.5  mg into the skin once a week. 2 mL 4   fenofibrate (TRICOR) 145 MG tablet Take 1 tablet (145 mg total) by mouth daily. 90 tablet 3   fluticasone (FLONASE) 50 MCG/ACT nasal spray SPRAY 2 SPRAYS INTO EACH NOSTRIL EVERY DAY 48 mL 2   icosapent Ethyl (VASCEPA) 1 g capsule Take 2 capsules (2 g total) by mouth 2 (two) times daily. (Patient not taking: Reported on 02/09/2024) 120 capsule 2   insulin glargine (LANTUS) 100 UNIT/ML Solostar Pen Inject 40 Units into the skin at bedtime. Titrate by 2 units every 3 days for fasting morning blood sugar >150. (Patient taking differently: Inject 28 Units into the skin at bedtime. Titrate by 2 units every 3 days for fasting morning blood sugar >150.) 15 mL 11   Insulin Pen Needle (PEN NEEDLES 31GX5/16") 31G X 8 MM MISC 1 each by Does not apply route daily. 100 each 11   latanoprost (XALATAN) 0.005 % ophthalmic solution 1 drop at bedtime. (Patient not taking: Reported on 02/23/2024)     levothyroxine (SYNTHROID) 88 MCG tablet Take 1 tablet (88 mcg total) by mouth daily. 90 tablet 1   lidocaine-prilocaine (EMLA) cream Apply to affected area once 30 g 3   loperamide (IMODIUM) 2 MG capsule Take 2 tabs by mouth with first loose stool, then 1 tab with each additional loose stool as needed. Do not exceed 8 tabs in a 24-hour period 60 capsule 3   metoprolol (TOPROL-XL) 200 MG 24 hr tablet Take 1 tablet (200 mg total) by mouth daily. 90 tablet 1   mirtazapine (REMERON) 15 MG tablet Take 1 tablet (15 mg total) by mouth at bedtime. 60 tablet 0   Misc Natural Products (T-RELIEF CBD+13 SL) Place 500 mg under the tongue daily. (Patient not taking: Reported on 02/20/2024)     Nutritional Supplements (QUINOA/KALE/HEMP PO) Place 750 mg under the tongue. (Patient not taking: Reported on 02/20/2024)     ondansetron (ZOFRAN) 8 MG tablet Take 1 tablet (8 mg total) by mouth every 8 (eight) hours as needed for nausea or vomiting. Start on the third day after chemotherapy 30 tablet 1   oxyCODONE  (OXY IR/ROXICODONE) 5 MG immediate release tablet Take 1 tablet (5 mg total) by mouth every 4 (four) hours as needed for severe pain (pain score 7-10). 60 tablet 0   potassium chloride (KLOR-CON M10) 10 MEQ tablet Take 1 tablet (10 mEq total) by mouth daily. 90 tablet 1   prochlorperazine (COMPAZINE) 10 MG tablet Take 1 tablet (10 mg total) by mouth every 6 (six) hours as needed for nausea or vomiting (Nausea or vomiting). 30 tablet 1   valsartan-hydrochlorothiazide (DIOVAN-HCT) 320-12.5 MG tablet Take 1 tablet by mouth daily. 90 tablet 3   VYZULTA 0.024 % SOLN Apply to eye.     No current facility-administered medications on file prior to visit.       Assessment/Plan:   Diabetes: -  Currently controlled - Encourage patient to continue to have regular well-balanced meals and snacks throughout the day, while controlling carbohydrate portion sizes, as able - Share with patient recommendation from team to stop Trulicity given pancreatic mass. Patient agrees and states will stop Trulicity at this time. - Regarding Lantus dosing, advise patient that per PCP, she may adjust Lantus dose as needed based on fasting morning blood sugar. May increase by 2 units every 7 days if morning fasting readings consistently >150  Patient writes down this instruction and verbalizes understanding via teach back - Recommend to use Freestyle Libre 3 Plus CGM to monitor blood sugar/as feedback on dietary choices Recommend to check glucose with fingerstick check when needed for symptoms and as back up to CGM.  Patient to contact office if needed for readings outside of established parameters or symptoms, particularly for any signs of hypoglycemia - Have counseled patient on s/s of low blood sugar and how to treat lows. Encourage patient to continue to carry glucose tablets with her - Contact [employer] patient assistance program on behalf of patient to cancel further shipments of Trulicity from assistance program  Follow  Up Plan: Clinical Pharmacist will follow up with patient by telephone on 04/24/2024 at 10:00 AM    Estelle Grumbles, PharmD, [facility] [phone]

## 2024-03-03 ENCOUNTER — Encounter: Payer: Self-pay | Admitting: Oncology

## 2024-03-03 NOTE — Progress Notes (Signed)
 Hematology/Oncology Consult note [facility] [phone] [phone]  Patient Care Team: Sallee Provencal, FNP as PCP - General (Family Medicine) Irene Limbo., MD as Consulting Physician (Ophthalmology) Ronney Asters Jackelyn Poling, RPH-CPP as Pharmacist Benita Gutter, RN as Oncology Nurse Navigator   Name of the patient: Heather Burke  [id number]  10/31/1951    Reason for referral-new diagnosis of pancreatic cancer   Referring physician-Kelly Ephriam Knuckles, FNP  Date of visit: 03/03/24   History of presenting illness- Patient is a 73 year old female who has been having ongoing symptoms of fatigue abdominal pain and weight loss over the last 3 to 4 months.  She presented with symptoms of worsening left lower quadrant abdominal pain in February 2025 to the ER and underwent CT abdomen which showed a 3.6 cm mass in the uncinate process of the pancreas.  Upstream pancreatic atrophy and irregular pancreatic ductal dilatation.  Multiple hypodense masses within the liver consistent with metastasis.  5.3 cm heterogeneously enhancing right renal mass consistent with renal cell carcinoma.  Pathologic adenopathy within the porta hepatis consistent with nodal metastases.  Trace pelvic free fluid.  3.1 cm infrarenal abdominal aortic aneurysm.  CA 19-9 was elevated at 4720.  I had arranged for an ultrasound-guided liver biopsy prior to my visit with her today.  Biopsy was consistent with poorly differentiated adenocarcinoma that was positive for CK7.  CK20 GATA3 ER CDX2 PAX8 SOX10 and TTF-1 are negative.  Diagnostic considerations include primary intrahepatic cholangiocarcinoma and metastatic adenocarcinoma of pancreaticobiliary or upper GI origin.  Patient is here to discuss further management.  She lives alone and is independent of her ADLs and IADLs  She has a prior history ofStage Ia multifocal left breast cancer s/p left mastectomy in August 2016 ER/PR positive  HER2 negative MammaPrint low risk.  She did not require adjuvant chemotherapy and used endocrine therapy for about 2 to 3 years before she stopped it due to intolerance.  ECOG PS- 1  Pain scale- 4   Review of systems- Review of Systems  Constitutional:  Positive for malaise/fatigue and weight loss. Negative for chills and fever.  HENT:  Negative for congestion, ear discharge and nosebleeds.   Eyes:  Negative for blurred vision.  Respiratory:  Negative for cough, hemoptysis, sputum production, shortness of breath and wheezing.   Cardiovascular:  Negative for chest pain, palpitations, orthopnea and claudication.  Gastrointestinal:  Positive for abdominal pain. Negative for blood in stool, constipation, diarrhea, heartburn, melena, nausea and vomiting.  Genitourinary:  Negative for dysuria, flank pain, frequency, hematuria and urgency.  Musculoskeletal:  Negative for back pain, joint pain and myalgias.  Skin:  Negative for rash.  Neurological:  Negative for dizziness, tingling, focal weakness, seizures, weakness and headaches.  Endo/Heme/Allergies:  Does not bruise/bleed easily.  Psychiatric/Behavioral:  Negative for depression and suicidal ideas. The patient does not have insomnia.     Allergies  Allergen Reactions   Metformin And Related Diarrhea    SEVERE DIARRHEA   Amlodipine Nausea Only and Other (See Comments)    Stomach cramping   Atorvastatin Nausea And Vomiting   Lisinopril Cough   Pravastatin Nausea And Vomiting   Icosapent Ethyl (Epa Ethyl Ester) (Fish) Palpitations   Nifedipine Er Other (See Comments)    headache    Patient Active Problem List   Diagnosis Date Noted   Malignant neoplasm of other parts of pancreas (HCC) 02/23/2024   Left upper quadrant abdominal pain 02/09/2024   Sweating increase 02/09/2024   Hypokalemia  02/09/2024   Abnormal skin growth 08/09/2023   Sebaceous cyst of skin of breast 08/09/2023   Fatigue 05/09/2023   Eye pressure 07/15/2022    Stress and adjustment reaction 07/14/2022   Sore throat 04/27/2022   Chronic pain of right knee 01/14/2022   Need for diphtheria-tetanus-pertussis (Tdap) vaccine 01/14/2022   Encounter for subsequent annual wellness visit (AWV) in Medicare patient 01/14/2022   Alcohol dependence, daily use (HCC) 01/14/2022   Annual physical exam 01/14/2022   Hyperlipidemia associated with type 2 diabetes mellitus (HCC) 06/15/2021   Overweight 06/15/2021   Statin intolerance 12/29/2020   Benign neoplasm of sigmoid colon    Mass of right breast 02/17/2016   History of parotitis 01/05/2016   Hypothyroidism 09/29/2015   Hyperlipemia 09/29/2015   Newly recognized murmur 09/29/2015   Type 2 diabetes mellitus with other diabetic kidney complication (HCC) 08/27/2015   Avitaminosis D 08/27/2015   Compulsive tobacco user syndrome 08/27/2015   Hypertension associated with diabetes (HCC) 07/01/2015     Past Medical History:  Diagnosis Date   Breast cancer (HCC) 06/08/2015   T1c, N0;  ER+,PR+, Her 2 neg (FISH neg) x2., SLN x 3 negative. Mammoprint: Low risk. Multifocal.Invasive mammary and lobular carcinoma.    Diabetes mellitus without complication (HCC)    GERD (gastroesophageal reflux disease)    Heart murmur 2016   newly diagnosed, slight murmur   Hyperlipidemia    Hypertension    Hypothyroidism    Lupus    PONV (postoperative nausea and vomiting)    with Hysterectomy   Thyroid disease      Past Surgical History:  Procedure Laterality Date   ABDOMINAL HYSTERECTOMY     BREAST BIOPSY Right 2002   negative/ Dr. Lemar Livings   BREAST BIOPSY Left 06-08-15   INVASIVE MAMMARY CARCINOMA WITH PARTIAL SOLID PATTERN.    BREAST BIOPSY Right 06/26/2018   FIBROADENOMATOUS CHANGES WITH STROMAL HYALINIZATION AND CALCIFICATIONS   COLONOSCOPY WITH PROPOFOL N/A 09/13/2016   Procedure: COLONOSCOPY WITH PROPOFOL;  Surgeon: Midge Minium, MD;  Location: [facility] ENDOSCOPY;  Service: Endoscopy;  Laterality: N/A;   core biopsy  Left 06/08/15   GUM SURGERY  2006   IR IMAGING GUIDED PORT INSERTION  02/28/2024   MASTECTOMY Left 2016   OOPHORECTOMY     SENTINEL NODE BIOPSY Left 08/18/2015   Procedure: SENTINEL NODE BIOPSY;  Surgeon: Earline Mayotte, MD;  Location: [facility] ORS;  Service: General;  Laterality: Left;   SIMPLE MASTECTOMY WITH AXILLARY SENTINEL NODE BIOPSY Left 08/18/2015   Procedure: SIMPLE MASTECTOMY;  Surgeon: Earline Mayotte, MD;  Location: [facility] ORS;  Service: General;  Laterality: Left;   TONSILLECTOMY      Social History   Socioeconomic History   Marital status: Widowed    Spouse name: Not on file   Number of children: 2   Years of education: H/S   Highest education level: 12th grade  Occupational History   Occupation: Part-Time    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 PT states no beer since 02/10/24   Drug use: No   Sexual activity: Not on file  Other Topics Concern   Not on file  Social History Narrative   Not on file   Social Drivers of Health   Financial Resource Strain: Low Risk  (12/26/2023)   Overall Financial Resource Strain (CARDIA)    Difficulty of Paying Living Expenses: Not hard at all  Food Insecurity: No Food Insecurity (02/23/2024)   Hunger Vital Sign    Worried About Running Out of Food in the Last Year: Never true    Ran Out of Food in the Last Year: Never true  Transportation Needs: No Transportation Needs (02/23/2024)   PRAPARE - Administrator, Civil Service (Medical): No    Lack of  Transportation (Non-Medical): No  Physical Activity: Insufficiently Active (12/26/2023)   Exercise Vital Sign    Days of Exercise per Week: 4 days    Minutes of Exercise per Session: 30 min  Stress: No Stress Concern Present (12/26/2023)   [employer] of Occupational Health - Occupational Stress Questionnaire    Feeling of Stress : Not at all  Social Connections: Moderately Isolated (12/26/2023)   Social Connection and Isolation Panel [NHANES]    Frequency of Communication with Friends and Family: More than three times a week    Frequency of Social Gatherings with Friends and Family: More than three times a week    Attends Religious Services: 1 to 4 times per year    Active Member of [employer] or Organizations: No    Attends Banker Meetings: Never    Marital Status: Widowed  Intimate Partner Violence: Not At Risk (02/23/2024)   Humiliation, Afraid, Rape, and Kick questionnaire    Fear of Current or Ex-Partner: No    Emotionally Abused: No    Physically Abused: No    Sexually Abused: No     Family History  Problem Relation Age of Onset   Cancer Father        prostate   Thyroid disease Daughter    Heart attack Sister    Breast cancer Cousin    Breast cancer Cousin    Breast cancer Other      Current Outpatient Medications:    ACCU-CHEK GUIDE TEST test strip, USE 1 STIP TO CHECK BLOOD SUGAR IN THE MORNING, AT NOON, AND AT BEDTIME, Disp: 100 strip, Rfl: 0   Accu-Chek Softclix Lancets lancets, 1 EACH BY DOES NOT APPLY ROUTE IN THE MORNING, AT NOON, AND AT BEDTIME, Disp: 100 each, Rfl: 0   Blood Glucose Monitoring Suppl DEVI, 1 each by Does not apply route in the morning, at noon, and at bedtime. May substitute to any manufacturer covered by patient's insurance., Disp: 1 each, Rfl: 0   Cholecalciferol 125 MCG (5000 UT) TABS, Take 1 tablet by mouth daily., Disp: , Rfl:    Continuous Glucose Sensor (FREESTYLE LIBRE 3 PLUS SENSOR) MISC, Place 1 sensor on the skin every 15  days. Use to check glucose continuously, Disp: 2 each, Rfl: 12   diltiazem (DILT-XR) 240 MG 24 hr capsule, Take 1 capsule (240 mg total) by mouth daily., Disp: 90 capsule, Rfl: 1   fenofibrate (TRICOR) 145 MG tablet, Take 1 tablet (145 mg total) by mouth daily., Disp: 90 tablet, Rfl: 3   fluticasone (FLONASE) 50 MCG/ACT nasal spray, SPRAY 2 SPRAYS INTO EACH NOSTRIL EVERY DAY, Disp:  48 mL, Rfl: 2   insulin glargine (LANTUS) 100 UNIT/ML Solostar Pen, Inject 40 Units into the skin at bedtime. Titrate by 2 units every 3 days for fasting morning blood sugar >150. (Patient taking differently: Inject 18 Units into the skin at bedtime. Titrate by 2 units every 7 days for fasting morning blood sugar >150.), Disp: 15 mL, Rfl: 11   Insulin Pen Needle (PEN NEEDLES 31GX5/16") 31G X 8 MM MISC, 1 each by Does not apply route daily., Disp: 100 each, Rfl: 11   levothyroxine (SYNTHROID) 88 MCG tablet, Take 1 tablet (88 mcg total) by mouth daily., Disp: 90 tablet, Rfl: 1   metoprolol (TOPROL-XL) 200 MG 24 hr tablet, Take 1 tablet (200 mg total) by mouth daily., Disp: 90 tablet, Rfl: 1   potassium chloride (KLOR-CON M10) 10 MEQ tablet, Take 1 tablet (10 mEq total) by mouth daily., Disp: 90 tablet, Rfl: 1   valsartan-hydrochlorothiazide (DIOVAN-HCT) 320-12.5 MG tablet, Take 1 tablet by mouth daily., Disp: 90 tablet, Rfl: 3   VYZULTA 0.024 % SOLN, Apply to eye., Disp: , Rfl:    aspirin 81 MG tablet, Take 81 mg by mouth daily. (Patient not taking: Reported on 02/23/2024), Disp: , Rfl:    dexamethasone (DECADRON) 4 MG tablet, Take 2 tablets (8 mg total) by mouth daily. Take 2 tablets daily x 3 days starting the day after chemotherapy. Take with food., Disp: 30 tablet, Rfl: 1   icosapent Ethyl (VASCEPA) 1 g capsule, Take 2 capsules (2 g total) by mouth 2 (two) times daily. (Patient not taking: Reported on 02/09/2024), Disp: 120 capsule, Rfl: 2   latanoprost (XALATAN) 0.005 % ophthalmic solution, 1 drop at bedtime. (Patient not  taking: Reported on 02/23/2024), Disp: , Rfl:    lidocaine-prilocaine (EMLA) cream, Apply to affected area once, Disp: 30 g, Rfl: 3   loperamide (IMODIUM) 2 MG capsule, Take 2 tabs by mouth with first loose stool, then 1 tab with each additional loose stool as needed. Do not exceed 8 tabs in a 24-hour period, Disp: 60 capsule, Rfl: 3   mirtazapine (REMERON) 15 MG tablet, Take 1 tablet (15 mg total) by mouth at bedtime., Disp: 60 tablet, Rfl: 0   Misc Natural Products (T-RELIEF CBD+13 SL), Place 500 mg under the tongue daily. (Patient not taking: Reported on 02/20/2024), Disp: , Rfl:    Nutritional Supplements (QUINOA/KALE/HEMP PO), Place 750 mg under the tongue. (Patient not taking: Reported on 02/20/2024), Disp: , Rfl:    ondansetron (ZOFRAN) 8 MG tablet, Take 1 tablet (8 mg total) by mouth every 8 (eight) hours as needed for nausea or vomiting. Start on the third day after chemotherapy, Disp: 30 tablet, Rfl: 1   oxyCODONE (OXY IR/ROXICODONE) 5 MG immediate release tablet, Take 1 tablet (5 mg total) by mouth every 4 (four) hours as needed for severe pain (pain score 7-10)., Disp: 60 tablet, Rfl: 0   prochlorperazine (COMPAZINE) 10 MG tablet, Take 1 tablet (10 mg total) by mouth every 6 (six) hours as needed for nausea or vomiting (Nausea or vomiting)., Disp: 30 tablet, Rfl: 1   Physical exam:  Vitals:   02/23/24 0851  BP: (!) 149/61  Pulse: 66  Temp: 98 F (36.7 C)  TempSrc: Oral  SpO2: 100%  Weight: 132 lb (59.9 kg)  Height: 5\' 3"  (1.6 m)   Physical Exam Cardiovascular:     Rate and Rhythm: Normal rate and regular rhythm.     Heart sounds: Normal heart sounds.  Pulmonary:  Effort: Pulmonary effort is normal.     Breath sounds: Normal breath sounds.  Abdominal:     General: Bowel sounds are normal.     Palpations: Abdomen is soft.  Skin:    General: Skin is warm and dry.  Neurological:     Mental Status: She is alert and oriented to person, place, and time.           Latest  Ref Rng & Units 02/10/2024    2:55 PM  CMP  Glucose 70 - 99 mg/dL 409   BUN 8 - 23 mg/dL 8   Creatinine 8.11 - 9.14 mg/dL 7.82   Sodium 956 - 213 mmol/L 130   Potassium 3.5 - 5.1 mmol/L 3.5   Chloride 98 - 111 mmol/L 95   CO2 22 - 32 mmol/L 24   Calcium 8.9 - 10.3 mg/dL 8.8   Total Protein 6.5 - 8.1 g/dL 7.4   Total Bilirubin 0.0 - 1.2 mg/dL 0.5   Alkaline Phos 38 - 126 U/L 82   AST 15 - 41 U/L 33   ALT 0 - 44 U/L 17       Latest Ref Rng & Units 02/20/2024    7:58 AM  CBC  WBC 4.0 - 10.5 K/uL 11.8   Hemoglobin 12.0 - 15.0 g/dL 08.6   Hematocrit 57.8 - 46.0 % 38.1   Platelets 150 - 400 K/uL 447       IR IMAGING GUIDED PORT INSERTION Result Date: 02/28/2024 INDICATION: 73 year old female with pancreatic cancer in need of durable venous access to allow for chemotherapy. She presents for port catheter placement. EXAM: IMPLANTED PORT A CATH PLACEMENT WITH ULTRASOUND AND FLUOROSCOPIC GUIDANCE MEDICATIONS: None. ANESTHESIA/SEDATION: Versed 1 mg IV; Fentanyl 50 mcg IV; administered by the radiology nurse Moderate Sedation Time:  10 minutes The patient's vital signs and level of consciousness were monitored during the procedure by the interventional radiology nurse under my direct supervision. FLUOROSCOPY: Radiation exposure index: 2 mGy, reference air kerma COMPLICATIONS: None immediate. PROCEDURE: The right neck and chest was prepped with chlorhexidine, and draped in the usual sterile fashion using maximum barrier technique (cap and mask, sterile gown, sterile gloves, large sterile sheet, hand hygiene and cutaneous antiseptic). Local anesthesia was attained by infiltration with 1% lidocaine with epinephrine. Ultrasound demonstrated patency of the right internal jugular vein, and this was documented with an image. Under real-time ultrasound guidance, this vein was accessed with a 21 gauge micropuncture needle and image documentation was performed. A small dermatotomy was made at the access site  with an 11 scalpel. A 0.018" wire was advanced into the SVC and the access needle exchanged for a 32F micropuncture vascular sheath. The 0.018" wire was then removed and a 0.035" wire advanced into the IVC. An appropriate location for the subcutaneous reservoir was selected below the clavicle and an incision was made through the skin and underlying soft tissues. The subcutaneous tissues were then dissected using a combination of blunt and sharp surgical technique and a pocket was formed. A Bard Clear Vue single lumen power injectable portacatheter was then tunneled through the subcutaneous tissues from the pocket to the dermatotomy and the port reservoir placed within the subcutaneous pocket. The venous access site was then serially dilated and a peel away vascular sheath placed over the wire. The wire was removed and the port catheter advanced into position under fluoroscopic guidance. The catheter tip is positioned in the superior cavoatrial junction. This was documented with a spot image. The portacatheter  was then tested and found to flush and aspirate well. The port was flushed with saline followed by 100 units/mL heparinized saline. The pocket was then closed in two layers using first subdermal inverted interrupted absorbable sutures followed by a running subcuticular suture. The epidermis was then sealed with Dermabond. The dermatotomy at the venous access site was also closed with Dermabond. IMPRESSION: Successful placement of a right IJ approach Bard Clear Vue port catheter with ultrasound and fluoroscopic guidance. The catheter is ready for use. Electronically Signed   By: Malachy Moan M.D.   On: 02/28/2024 15:28   NM PET Image Initial (PI) Skull Base To Thigh Result Date: 02/23/2024 CLINICAL DATA:  Initial treatment strategy for pancreatic carcinoma. EXAM: NUCLEAR MEDICINE PET SKULL BASE TO THIGH TECHNIQUE: 7.1 mCi F-18 FDG was injected intravenously. Full-ring PET imaging was performed from the  skull base to thigh after the radiotracer. CT data was obtained and used for attenuation correction and anatomic localization. Fasting blood glucose: 75 mg/dl COMPARISON:  CT on 40/98/1191 FINDINGS: Mediastinal blood-pool activity (background): SUV max = 1.9 Liver activity (reference): SUV max = N/A NECK:  No hypermetabolic lymph nodes or masses. Incidental CT findings:  None. CHEST: 5 mm left internal mammary chain lymph node is hypermetabolic, with SUV max of 4.3. 5 mm precardiac mediastinal lymph node is also hypermetabolic, with SUV max of 4.0. No suspicious pulmonary nodules seen on CT images. Incidental CT findings:  None. ABDOMEN/PELVIS: 3.8 x 3.6 cm mass in the pancreatic head shows intense hypermetabolism, with SUV max of 15.4, consistent with primary pancreatic carcinoma. Hypermetabolic lymphadenopathy is seen in the adjacent peripancreatic retroperitoneum, gastrohepatic ligament, porta hepatis, and portacaval space, with 1 index lymph node in the gastrohepatic ligament measuring 2.1 cm with SUV max of 14.2. Numerous hypermetabolic metastases are seen throughout liver, largest occupying nearly the entire lateral segment of the left hepatic lobe this has SUV max of 14.2. A solid mass is seen in the anterior midpole of the right kidney measuring 4.7 x 4.4 cm. This shows mild FDG uptake compared to other disease, with SUV max of 2.9. This favors primary renal cell carcinoma over renal metastasis. No hypermetabolic masses or lymphadenopathy identified within pelvis. Incidental CT findings: Prior hysterectomy. Small amount of free fluid in pelvis. Colonic diverticulosis, without signs of diverticulitis. SKELETON: No focal hypermetabolic bone lesions to suggest skeletal metastasis. Incidental CT findings:  None. IMPRESSION: 3.8 cm hypermetabolic mass in pancreatic head, consistent with primary pancreatic carcinoma. Hypermetabolic abdominal lymphadenopathy, consistent with metastatic disease. Diffuse  hypermetabolic liver metastases. 5 mm hypermetabolic left internal mammary and precardiac lymph nodes, consistent with metastatic disease. 4.7 cm solid mass in anterior midpole of right kidney, which shows mild FDG uptake. This favors primary renal cell carcinoma over renal metastasis. Electronically Signed   By: Danae Orleans M.D.   On: 02/23/2024 08:35   US BIOPSY (LIVER) Result Date: 02/20/2024 INDICATION: Multiple liver lesions, mass of the head of the pancreas, right renal mass and prior history of breast carcinoma. Clinical suspicion of metastatic pancreatic carcinoma based on recent imaging. Presenting for liver lesion biopsy. EXAM: ULTRASOUND GUIDED CORE BIOPSY OF LIVER MASS MEDICATIONS: None. ANESTHESIA/SEDATION: Moderate (conscious) sedation was employed during this procedure. A total of Versed 1.0 mg and Fentanyl 50 mcg was administered intravenously. Moderate Sedation Time: 13 minutes. The patient's level of consciousness and vital signs were monitored continuously by radiology nursing throughout the procedure under my direct supervision. PROCEDURE: The procedure, risks, benefits, and alternatives were explained to the  patient. Questions regarding the procedure were encouraged and answered. The patient understands and consents to the procedure. A time-out was performed prior to initiating the procedure. The abdominal wall was prepped with chlorhexidine in a sterile fashion, and a sterile drape was applied covering the operative field. A sterile gown and sterile gloves were used for the procedure. Local anesthesia was provided with 1% Lidocaine. Ultrasound was used to localize a lesion within the left lobe of the liver. Under ultrasound guidance, a 17 gauge trocar needle was advanced into the lesion. Three separate coaxial 18 gauge core biopsy samples were obtained and submitted in formalin. A slurry of Gel-Foam EmboCubes was then injected as the trocar needle was retracted and removed. Additional  ultrasound imaging was performed. COMPLICATIONS: None immediate. FINDINGS: Confluent tumor in the left lobe of the liver was localized measuring nearly 9 cm in maximum diameter. Solid tissue was obtained. IMPRESSION: Ultrasound-guided core biopsy performed of tumor within the left lobe of the liver. Electronically Signed   By: Irish Lack M.D.   On: 02/20/2024 10:00   CT ABDOMEN PELVIS W CONTRAST Result Date: 02/10/2024 CLINICAL DATA:  Left lower quadrant abdominal pain EXAM: CT ABDOMEN AND PELVIS WITH CONTRAST TECHNIQUE: Multidetector CT imaging of the abdomen and pelvis was performed using the standard protocol following bolus administration of intravenous contrast. RADIATION DOSE REDUCTION: This exam was performed according to the departmental dose-optimization program which includes automated exposure control, adjustment of the mA and/or kV according to patient size and/or use of iterative reconstruction technique. CONTRAST:  75mL OMNIPAQUE IOHEXOL 350 MG/ML SOLN COMPARISON:  None Available. FINDINGS: Lower chest: No acute pleural or parenchymal lung disease. Dense calcification of the mitral valve. Hepatobiliary: There are multiple hypodense lesions within the liver, concerning for metastatic disease. The 2 largest lesions are seen within the left lobe, measuring 4.0 x 3.7 cm and 3.5 x 2.9 cm, reference images 12 and 13 of series 2. Gallbladder is decompressed, with nonspecific gallbladder wall thickening. No pericholecystic fluid or calcified gallstones. No biliary duct dilation. Pancreas: There is a 3.6 x 3.6 cm mass centered within the uncinate process of the pancreas, reference image 24/2, consistent with malignancy. There is upstream pancreatic atrophy and irregular pancreatic duct dilation. No acute inflammatory changes. Spleen: Normal in size without focal abnormality. Adrenals/Urinary Tract: The adrenals are unremarkable. There is a 5.1 x 4.6 x 5.3 cm heterogeneously enhancing mass within the  anterior aspect of the right kidney, compatible with renal cell carcinoma. No evidence of extension into the renal pelvis or vascular involvement. Simple cyst upper pole left kidney does not require follow-up. Otherwise the left kidney is unremarkable. The bladder is moderately distended, without filling defect. Stomach/Bowel: No bowel obstruction or ileus. Normal appendix right lower quadrant. No bowel wall thickening or inflammatory change. Vascular/Lymphatic: 3.1 cm infrarenal abdominal aortic aneurysm. Diffuse aortic atherosclerosis. Multiple enlarged lymph nodes are seen within the porta hepatis, consistent with nodal metastases. Largest lymph node reference image 18/2 measures 14 mm in short axis. Reproductive: Prior hysterectomy.  No adnexal masses. Other: Trace pelvic free fluid. No free intraperitoneal gas. No abdominal wall hernia. Musculoskeletal: There are no acute or destructive bony abnormalities. Reconstructed images demonstrate no additional findings. IMPRESSION: 1. 3.6 cm mass within the uncinate process of the pancreas, consistent with malignancy. Upstream pancreatic atrophy and irregular pancreatic duct dilation. Further evaluation with EUS may be useful. 2. Multiple hypodense masses within the liver, largest in the left lobe, consistent with hepatic metastases. 3. 5.3 cm heterogeneously enhancing  right renal mass, consistent with renal cell carcinoma. 4. Pathologic adenopathy within the porta hepatis, consistent with nodal metastases. 5. Trace pelvic free fluid. 6. 3.1 cm infrarenal abdominal aortic aneurysm. Recommend follow-up ultrasound every 3 years. (Ref.: J Vasc Surg. 2018; 67:2-77 and J Am Coll Radiol 2013;10(10):789-794.) 7.  Aortic Atherosclerosis (ICD10-I70.0). Electronically Signed   By: Sharlet Salina M.D.   On: 02/10/2024 17:48    Assessment and plan- Patient is a 73 y.o. female referred for new diagnosis of metastatic pancreatic cancer  I have reviewed PET CT scan images  independently and discussed findings with the patient which shows 3.8 cm hypermetabolic pancreatic head mass as well as abdominal lymphadenopathy and diffuse hypermetabolic liver metastases.  Ultrasound-guided biopsy showed CK7 positive adenocarcinoma and although differentials included cholangiocarcinoma versus pancreatic versus upper GI primary given the fact that she has a pancreatic head mass this is clinically consistent with metastatic pancreatic cancer.  She was also found to have a 4.7 cm solid mass in the right kidney which can either be a second primary renal cell carcinoma versus pancreatic cancer metastases which would be atypical.  Regardless priority would be to manage her metastatic pancreatic cancer at this time.  Discussed that this would constitute stage IV disease and treatment would be palliative not curative.  There are 2 main regimens for metastatic pancreatic cancer.  Modified FOLFIRINOX or gemcitabine Abraxane.  Patient's performance status would permit [place] to use modified FOLFIRINOX to begin with.  This would be given IV every 2 weeks until progression or toxicity.  Discussed risks and benefits of modified FOLFIRINOX chemotherapy including all but not limited to nausea vomiting diarrhea, low blood counts risk of infections and hospitalizations.  Risk of peripheral neuropathy associated with oxaliplatin.  Patient understands and agrees to proceed as planned.  Will plan for port placement and chemo teach and tentatively start treatment in about 10 days time.  Will also send blood for genetic testing given her prior history of breast cancer as well.  We will also test for NGS testing on her tumor specimen  Neoplasm related pain: I will start her on as needed oxycodone today   Thank you for this kind referral and the opportunity to participate in the care of this  Patient   Visit Diagnosis 1. Malignant neoplasm of other parts of pancreas (HCC)   2. Goals of care,  counseling/discussion   3. Neoplasm related pain   4. Carcinoma of pancreas metastatic to liver [facility])     Dr. Owens Shark, MD, MPH [facility] [phone] 03/03/2024

## 2024-03-04 MED FILL — Fosaprepitant Dimeglumine For IV Infusion 150 MG (Base Eq): INTRAVENOUS | Qty: 5 | Status: AC

## 2024-03-05 ENCOUNTER — Other Ambulatory Visit: Payer: Self-pay

## 2024-03-05 ENCOUNTER — Inpatient Hospital Stay

## 2024-03-05 ENCOUNTER — Encounter: Payer: Self-pay | Admitting: Oncology

## 2024-03-05 ENCOUNTER — Inpatient Hospital Stay: Admitting: Oncology

## 2024-03-05 VITALS — BP 128/60 | HR 59 | Temp 96.2°F | Resp 18

## 2024-03-05 VITALS — BP 125/59 | HR 60 | Temp 95.6°F | Resp 19 | Ht 63.0 in | Wt 136.8 lb

## 2024-03-05 DIAGNOSIS — E871 Hypo-osmolality and hyponatremia: Secondary | ICD-10-CM | POA: Diagnosis not present

## 2024-03-05 DIAGNOSIS — C257 Malignant neoplasm of other parts of pancreas: Secondary | ICD-10-CM

## 2024-03-05 DIAGNOSIS — Z853 Personal history of malignant neoplasm of breast: Secondary | ICD-10-CM

## 2024-03-05 DIAGNOSIS — Z5111 Encounter for antineoplastic chemotherapy: Secondary | ICD-10-CM

## 2024-03-05 DIAGNOSIS — C787 Secondary malignant neoplasm of liver and intrahepatic bile duct: Secondary | ICD-10-CM | POA: Diagnosis not present

## 2024-03-05 DIAGNOSIS — C259 Malignant neoplasm of pancreas, unspecified: Secondary | ICD-10-CM | POA: Diagnosis not present

## 2024-03-05 DIAGNOSIS — R509 Fever, unspecified: Secondary | ICD-10-CM | POA: Diagnosis not present

## 2024-03-05 LAB — CBC WITH DIFFERENTIAL (CANCER CENTER ONLY)
Abs Immature Granulocytes: 0.34 10*3/uL — ABNORMAL HIGH (ref 0.00–0.07)
Basophils Absolute: 0.1 10*3/uL (ref 0.0–0.1)
Basophils Relative: 0 %
Eosinophils Absolute: 0.1 10*3/uL (ref 0.0–0.5)
Eosinophils Relative: 1 %
HCT: 32.5 % — ABNORMAL LOW (ref 36.0–46.0)
Hemoglobin: 11.2 g/dL — ABNORMAL LOW (ref 12.0–15.0)
Immature Granulocytes: 2 %
Lymphocytes Relative: 6 %
Lymphs Abs: 1.3 10*3/uL (ref 0.7–4.0)
MCH: 29.6 pg (ref 26.0–34.0)
MCHC: 34.5 g/dL (ref 30.0–36.0)
MCV: 85.8 fL (ref 80.0–100.0)
Monocytes Absolute: 1.9 10*3/uL — ABNORMAL HIGH (ref 0.1–1.0)
Monocytes Relative: 9 %
Neutro Abs: 16.6 10*3/uL — ABNORMAL HIGH (ref 1.7–7.7)
Neutrophils Relative %: 82 %
Platelet Count: 362 10*3/uL (ref 150–400)
RBC: 3.79 MIL/uL — ABNORMAL LOW (ref 3.87–5.11)
RDW: 13.8 % (ref 11.5–15.5)
WBC Count: 20.3 10*3/uL — ABNORMAL HIGH (ref 4.0–10.5)
nRBC: 0 % (ref 0.0–0.2)

## 2024-03-05 LAB — CMP (CANCER CENTER ONLY)
ALT: 21 U/L (ref 0–44)
AST: 63 U/L — ABNORMAL HIGH (ref 15–41)
Albumin: 1.9 g/dL — ABNORMAL LOW (ref 3.5–5.0)
Alkaline Phosphatase: 153 U/L — ABNORMAL HIGH (ref 38–126)
Anion gap: 10 (ref 5–15)
BUN: 17 mg/dL (ref 8–23)
CO2: 22 mmol/L (ref 22–32)
Calcium: 7.8 mg/dL — ABNORMAL LOW (ref 8.9–10.3)
Chloride: 91 mmol/L — ABNORMAL LOW (ref 98–111)
Creatinine: 0.67 mg/dL (ref 0.44–1.00)
GFR, Estimated: >60 mL/min (ref >60)
Glucose, Bld: 161 mg/dL — ABNORMAL HIGH (ref 70–99)
Potassium: 3.6 mmol/L (ref 3.5–5.1)
Sodium: 123 mmol/L — ABNORMAL LOW (ref 135–145)
Total Bilirubin: 1.2 mg/dL (ref 0.0–1.2)
Total Protein: 6.6 g/dL (ref 6.5–8.1)

## 2024-03-05 LAB — GENETIC SCREENING ORDER

## 2024-03-05 MED ORDER — ATROPINE SULFATE 1 MG/ML IV SOLN
0.5000 mg | Freq: Once | INTRAVENOUS | Status: AC | PRN
  Administered 2024-03-05: 0.5 mg via INTRAVENOUS
  Filled 2024-03-05: qty 1

## 2024-03-05 MED ORDER — DEXAMETHASONE SODIUM PHOSPHATE 10 MG/ML IJ SOLN
10.0000 mg | Freq: Once | INTRAMUSCULAR | Status: AC
  Administered 2024-03-05: 10 mg via INTRAVENOUS
  Filled 2024-03-05: qty 1

## 2024-03-05 MED ORDER — SODIUM CHLORIDE 0.9 % IV SOLN
Freq: Once | INTRAVENOUS | Status: AC
  Filled 2024-03-05: qty 250

## 2024-03-05 MED ORDER — OXALIPLATIN CHEMO INJECTION 100 MG/20ML
85.0000 mg/m2 | Freq: Once | INTRAVENOUS | Status: AC
  Administered 2024-03-05: 150 mg via INTRAVENOUS
  Filled 2024-03-05: qty 10

## 2024-03-05 MED ORDER — DEXTROSE 5 % IV SOLN
INTRAVENOUS | Status: DC
  Filled 2024-03-05: qty 250

## 2024-03-05 MED ORDER — SODIUM CHLORIDE 0.9 % IV SOLN
2400.0000 mg/m2 | INTRAVENOUS | Status: DC
  Administered 2024-03-05: 3900 mg via INTRAVENOUS
  Filled 2024-03-05: qty 78

## 2024-03-05 MED ORDER — LEVOFLOXACIN 250 MG PO TABS: 500.0000 mg | ORAL_TABLET | Freq: Every day | ORAL | 0 refills | Status: DC

## 2024-03-05 MED ORDER — SODIUM CHLORIDE 0.9 % IV SOLN
150.0000 mg/m2 | Freq: Once | INTRAVENOUS | Status: AC
  Administered 2024-03-05: 240 mg via INTRAVENOUS
  Filled 2024-03-05: qty 10

## 2024-03-05 MED ORDER — SODIUM CHLORIDE 0.9 % IV SOLN
150.0000 mg | Freq: Once | INTRAVENOUS | Status: AC
  Administered 2024-03-05: 150 mg via INTRAVENOUS
  Filled 2024-03-05: qty 150

## 2024-03-05 MED ORDER — PALONOSETRON HCL INJECTION 0.25 MG/5ML
0.2500 mg | Freq: Once | INTRAVENOUS | Status: AC
  Administered 2024-03-05: 0.25 mg via INTRAVENOUS
  Filled 2024-03-05: qty 5

## 2024-03-05 MED ORDER — SODIUM CHLORIDE 0.9 % IV SOLN
400.0000 mg/m2 | Freq: Once | INTRAVENOUS | Status: AC
  Administered 2024-03-05: 648 mg via INTRAVENOUS
  Filled 2024-03-05: qty 32.4

## 2024-03-05 NOTE — Progress Notes (Signed)
 Hematology/Oncology Consult note [facility]  [phone] [phone]  Patient Care Team: Sallee Provencal, FNP as PCP - General (Family Medicine) Irene Limbo., MD as Consulting Physician (Ophthalmology) Ronney Asters Jackelyn Poling, RPH-CPP as Pharmacist Benita Gutter, RN as Oncology Nurse Navigator   Name of the patient: Heather Burke  [id number]  1951-06-27   Date of visit: 03/05/24  Diagnosis-metastatic pancreatic cancer with liver metastases  Chief complaint/ Reason for visit-on treatment assessment prior to cycle 1 of modified FOLFIRINOX chemotherapy  Heme/Onc history: Patient is a 73 year old female who has been having ongoing symptoms of fatigue abdominal pain and weight loss over the last 3 to 4 months.  She presented with symptoms of worsening left lower quadrant abdominal pain in February 2025 to the ER and underwent CT abdomen which showed a 3.6 cm mass in the uncinate process of the pancreas.  Upstream pancreatic atrophy and irregular pancreatic ductal dilatation.  Multiple hypodense masses within the liver consistent with metastasis.  5.3 cm heterogeneously enhancing right renal mass consistent with renal cell carcinoma.  Pathologic adenopathy within the porta hepatis consistent with nodal metastases.  Trace pelvic free fluid.  3.1 cm infrarenal abdominal aortic aneurysm.  CA 19-9 was elevated at 4720.  I had arranged for an ultrasound-guided liver biopsy prior to my visit with her today.  Biopsy was consistent with poorly differentiated adenocarcinoma that was positive for CK7.  CK20 GATA3 ER CDX2 PAX8 SOX10 and TTF-1 are negative.  Diagnostic considerations include primary intrahepatic cholangiocarcinoma and metastatic adenocarcinoma of pancreaticobiliary or upper GI origin.  Given the clinical picture of uncinate process mass in the pancreas this would be consistent with metastatic pancreatic cancer in the setting of elevated CA  19-9.  She has a prior history ofStage Ia multifocal left breast cancer s/p left mastectomy in August 2016 ER/PR positive HER2 negative MammaPrint low risk.  She did not require adjuvant chemotherapy and used endocrine therapy for about 2 to 3 years before she stopped it due to intolerance.    Interval history-continues to have ongoing fatigue.  Appetite is poor.  Pain is presently well-controlled with oxycodone which she is not using much.  She has been having intermittent  fever up to 101 at home and her last episode was a day ago.  She reports no cough or sputum production or worsening shortness of breath.  Denies any dysuria  ECOG PS- 1 Pain scale- 2 Opioid associated constipation- no  Review of systems- Review of Systems  Constitutional:  Positive for fever and malaise/fatigue. Negative for chills and weight loss.       Lack of appetite  HENT:  Negative for congestion, ear discharge and nosebleeds.   Eyes:  Negative for blurred vision.  Respiratory:  Negative for cough, hemoptysis, sputum production, shortness of breath and wheezing.   Cardiovascular:  Negative for chest pain, palpitations, orthopnea and claudication.  Gastrointestinal:  Negative for abdominal pain, blood in stool, constipation, diarrhea, heartburn, melena, nausea and vomiting.  Genitourinary:  Negative for dysuria, flank pain, frequency, hematuria and urgency.  Musculoskeletal:  Negative for back pain, joint pain and myalgias.  Skin:  Negative for rash.  Neurological:  Negative for dizziness, tingling, focal weakness, seizures, weakness and headaches.  Endo/Heme/Allergies:  Does not bruise/bleed easily.  Psychiatric/Behavioral:  Negative for depression and suicidal ideas. The patient does not have insomnia.       Allergies  Allergen Reactions   Metformin And Related Diarrhea    SEVERE  DIARRHEA   Amlodipine Nausea Only and Other (See Comments)    Stomach cramping   Atorvastatin Nausea And Vomiting   Lisinopril  Cough   Pravastatin Nausea And Vomiting   Icosapent Ethyl (Epa Ethyl Ester) (Fish) Palpitations   Nifedipine Er Other (See Comments)    headache     Past Medical History:  Diagnosis Date   Breast cancer (HCC) 06/08/2015   T1c, N0;  ER+,PR+, Her 2 neg (FISH neg) x2., SLN x 3 negative. Mammoprint: Low risk. Multifocal.Invasive mammary and lobular carcinoma.    Diabetes mellitus without complication (HCC)    GERD (gastroesophageal reflux disease)    Heart murmur 2016   newly diagnosed, slight murmur   Hyperlipidemia    Hypertension    Hypothyroidism    Lupus    PONV (postoperative nausea and vomiting)    with Hysterectomy   Thyroid disease      Past Surgical History:  Procedure Laterality Date   ABDOMINAL HYSTERECTOMY     BREAST BIOPSY Right 2002   negative/ Dr. Lemar Livings   BREAST BIOPSY Left 06-08-15   INVASIVE MAMMARY CARCINOMA WITH PARTIAL SOLID PATTERN.    BREAST BIOPSY Right 06/26/2018   FIBROADENOMATOUS CHANGES WITH STROMAL HYALINIZATION AND CALCIFICATIONS   COLONOSCOPY WITH PROPOFOL N/A 09/13/2016   Procedure: COLONOSCOPY WITH PROPOFOL;  Surgeon: Midge Minium, MD;  Location: [facility] ENDOSCOPY;  Service: Endoscopy;  Laterality: N/A;   core biopsy Left 06/08/15   GUM SURGERY  2006   IR IMAGING GUIDED PORT INSERTION  02/28/2024   MASTECTOMY Left 2016   OOPHORECTOMY     SENTINEL NODE BIOPSY Left 08/18/2015   Procedure: SENTINEL NODE BIOPSY;  Surgeon: Earline Mayotte, MD;  Location: [facility] ORS;  Service: General;  Laterality: Left;   SIMPLE MASTECTOMY WITH AXILLARY SENTINEL NODE BIOPSY Left 08/18/2015   Procedure: SIMPLE MASTECTOMY;  Surgeon: Earline Mayotte, MD;  Location: [facility] ORS;  Service: General;  Laterality: Left;   TONSILLECTOMY      Social History   Socioeconomic History   Marital status: Widowed    Spouse name: Not on file   Number of children: 2   Years of education: H/S   Highest education level: 12th grade  Occupational History   Occupation: Part-Time     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 PT states no beer since 02/10/24   Drug use: No   Sexual activity: Not on file  Other Topics Concern   Not on file  Social History Narrative   Not on file   Social Drivers of Health   Financial Resource Strain: Low Risk  (12/26/2023)   Overall Financial Resource Strain (CARDIA)    Difficulty of Paying Living Expenses: Not hard at all  Food Insecurity: No Food Insecurity (02/23/2024)   Hunger Vital Sign    Worried About Running Out of Food in the Last Year: Never true    Ran Out of  Food in the Last Year: Never true  Transportation Needs: No Transportation Needs (02/23/2024)   PRAPARE - Administrator, Civil Service (Medical): No    Lack of Transportation (Non-Medical): No  Physical Activity: Insufficiently Active (12/26/2023)   Exercise Vital Sign    Days of Exercise per Week: 4 days    Minutes of Exercise per Session: 30 min  Stress: No Stress Concern Present (12/26/2023)   [employer] of Occupational Health - Occupational Stress Questionnaire    Feeling of Stress : Not at all  Social Connections: Moderately Isolated (12/26/2023)   Social Connection and Isolation Panel [NHANES]    Frequency of Communication with Friends and Family: More than three times a week    Frequency of Social Gatherings with Friends and Family: More than three times a week    Attends Religious Services: 1 to 4 times per year    Active Member of [employer] or Organizations: No    Attends Tax inspector Meetings: Never    Marital Status: Widowed  Intimate Partner Violence: Not At Risk (02/23/2024)   Humiliation, Afraid, Rape, and Kick questionnaire    Fear of Current or Ex-Partner: No    Emotionally Abused: No    Physically Abused: No    Sexually Abused: No    Family History  Problem Relation Age of Onset   Cancer Father        prostate   Thyroid disease Daughter    Heart attack Sister    Breast cancer Cousin    Breast cancer Cousin    Breast cancer Other      Current Outpatient Medications:    ACCU-CHEK GUIDE TEST test strip, USE 1 STIP TO CHECK BLOOD SUGAR IN THE MORNING, AT NOON, AND AT BEDTIME, Disp: 100 strip, Rfl: 0   Accu-Chek Softclix Lancets lancets, 1 EACH BY DOES NOT APPLY ROUTE IN THE MORNING, AT NOON, AND AT BEDTIME, Disp: 100 each, Rfl: 0   aspirin 81 MG tablet, Take 81 mg by mouth daily. (Patient not taking: Reported on 02/23/2024), Disp: , Rfl:    Blood Glucose Monitoring Suppl DEVI, 1 each by Does not apply route in the morning, at noon, and at bedtime. May substitute to any manufacturer covered by patient's insurance., Disp: 1 each, Rfl: 0   Cholecalciferol 125 MCG (5000 UT) TABS, Take 1 tablet by mouth daily., Disp: , Rfl:    Continuous Glucose Sensor (FREESTYLE LIBRE 3 PLUS SENSOR) MISC, Place 1 sensor on the skin every 15 days. Use to check glucose continuously, Disp: 2 each, Rfl: 12   dexamethasone (DECADRON) 4 MG tablet, Take 2 tablets (8 mg total) by mouth daily. Take 2 tablets daily x 3 days starting the day after chemotherapy. Take with food., Disp: 30 tablet, Rfl: 1   diltiazem (DILT-XR) 240 MG 24 hr capsule, Take 1 capsule (240 mg total) by mouth daily., Disp: 90 capsule, Rfl: 1   fenofibrate (TRICOR) 145 MG tablet, Take 1 tablet (145 mg total) by mouth daily., Disp: 90 tablet, Rfl: 3   fluticasone (FLONASE) 50 MCG/ACT nasal spray, SPRAY 2 SPRAYS INTO EACH NOSTRIL EVERY DAY, Disp: 48 mL, Rfl: 2   icosapent Ethyl (VASCEPA) 1 g capsule, Take 2  capsules (2 g total) by mouth 2 (two) times daily. (Patient not taking: Reported on 02/09/2024), Disp: 120 capsule, Rfl: 2   insulin glargine (LANTUS) 100 UNIT/ML Solostar Pen, Inject 40 Units into the skin at bedtime. Titrate by 2 units every 3 days for  fasting morning blood sugar >150. (Patient taking differently: Inject 18 Units into the skin at bedtime. Titrate by 2 units every 7 days for fasting morning blood sugar >150.), Disp: 15 mL, Rfl: 11   Insulin Pen Needle (PEN NEEDLES 31GX5/16") 31G X 8 MM MISC, 1 each by Does not apply route daily., Disp: 100 each, Rfl: 11   latanoprost (XALATAN) 0.005 % ophthalmic solution, 1 drop at bedtime. (Patient not taking: Reported on 02/23/2024), Disp: , Rfl:    levothyroxine (SYNTHROID) 88 MCG tablet, Take 1 tablet (88 mcg total) by mouth daily., Disp: 90 tablet, Rfl: 1   lidocaine-prilocaine (EMLA) cream, Apply to affected area once, Disp: 30 g, Rfl: 3   loperamide (IMODIUM) 2 MG capsule, Take 2 tabs by mouth with first loose stool, then 1 tab with each additional loose stool as needed. Do not exceed 8 tabs in a 24-hour period, Disp: 60 capsule, Rfl: 3   metoprolol (TOPROL-XL) 200 MG 24 hr tablet, Take 1 tablet (200 mg total) by mouth daily., Disp: 90 tablet, Rfl: 1   mirtazapine (REMERON) 15 MG tablet, Take 1 tablet (15 mg total) by mouth at bedtime., Disp: 60 tablet, Rfl: 0   Misc Natural Products (T-RELIEF CBD+13 SL), Place 500 mg under the tongue daily. (Patient not taking: Reported on 02/20/2024), Disp: , Rfl:    Nutritional Supplements (QUINOA/KALE/HEMP PO), Place 750 mg under the tongue. (Patient not taking: Reported on 02/20/2024), Disp: , Rfl:    ondansetron (ZOFRAN) 8 MG tablet, Take 1 tablet (8 mg total) by mouth every 8 (eight) hours as needed for nausea or vomiting. Start on the third day after chemotherapy, Disp: 30 tablet, Rfl: 1   oxyCODONE (OXY IR/ROXICODONE) 5 MG immediate release tablet, Take 1 tablet (5 mg total) by mouth every 4 (four) hours as  needed for severe pain (pain score 7-10)., Disp: 60 tablet, Rfl: 0   potassium chloride (KLOR-CON M10) 10 MEQ tablet, Take 1 tablet (10 mEq total) by mouth daily., Disp: 90 tablet, Rfl: 1   prochlorperazine (COMPAZINE) 10 MG tablet, Take 1 tablet (10 mg total) by mouth every 6 (six) hours as needed for nausea or vomiting (Nausea or vomiting)., Disp: 30 tablet, Rfl: 1   valsartan-hydrochlorothiazide (DIOVAN-HCT) 320-12.5 MG tablet, Take 1 tablet by mouth daily., Disp: 90 tablet, Rfl: 3   VYZULTA 0.024 % SOLN, Apply to eye., Disp: , Rfl:   Physical exam: There were no vitals filed for this visit. Physical Exam Constitutional:      General: She is not in acute distress. Cardiovascular:     Rate and Rhythm: Normal rate and regular rhythm.     Heart sounds: Normal heart sounds.  Pulmonary:     Effort: Pulmonary effort is normal.     Breath sounds: Normal breath sounds.  Abdominal:     General: Bowel sounds are normal.     Palpations: Abdomen is soft.  Skin:    General: Skin is warm and dry.  Neurological:     Mental Status: She is alert and oriented to person, place, and time.         Latest Ref Rng & Units 02/10/2024    2:55 PM  CMP  Glucose 70 - 99 mg/dL 213   BUN 8 - 23 mg/dL 8   Creatinine 0.86 - 5.78 mg/dL 4.69   Sodium 629 - 528 mmol/L 130   Potassium 3.5 - 5.1 mmol/L 3.5   Chloride 98 - 111 mmol/L 95   CO2 22 - 32  mmol/L 24   Calcium 8.9 - 10.3 mg/dL 8.8   Total Protein 6.5 - 8.1 g/dL 7.4   Total Bilirubin 0.0 - 1.2 mg/dL 0.5   Alkaline Phos 38 - 126 U/L 82   AST 15 - 41 U/L 33   ALT 0 - 44 U/L 17       Latest Ref Rng & Units 03/05/2024    8:07 AM  CBC  WBC 4.0 - 10.5 K/uL 20.3   Hemoglobin 12.0 - 15.0 g/dL 47.8   Hematocrit 29.5 - 46.0 % 32.5   Platelets 150 - 400 K/uL 362     No images are attached to the encounter.  IR IMAGING GUIDED PORT INSERTION Result Date: 02/28/2024 INDICATION: 73 year old female with pancreatic cancer in need of durable venous access  to allow for chemotherapy. She presents for port catheter placement. EXAM: IMPLANTED PORT A CATH PLACEMENT WITH ULTRASOUND AND FLUOROSCOPIC GUIDANCE MEDICATIONS: None. ANESTHESIA/SEDATION: Versed 1 mg IV; Fentanyl 50 mcg IV; administered by the radiology nurse Moderate Sedation Time:  10 minutes The patient's vital signs and level of consciousness were monitored during the procedure by the interventional radiology nurse under my direct supervision. FLUOROSCOPY: Radiation exposure index: 2 mGy, reference air kerma COMPLICATIONS: None immediate. PROCEDURE: The right neck and chest was prepped with chlorhexidine, and draped in the usual sterile fashion using maximum barrier technique (cap and mask, sterile gown, sterile gloves, large sterile sheet, hand hygiene and cutaneous antiseptic). Local anesthesia was attained by infiltration with 1% lidocaine with epinephrine. Ultrasound demonstrated patency of the right internal jugular vein, and this was documented with an image. Under real-time ultrasound guidance, this vein was accessed with a 21 gauge micropuncture needle and image documentation was performed. A small dermatotomy was made at the access site with an 11 scalpel. A 0.018" wire was advanced into the SVC and the access needle exchanged for a 44F micropuncture vascular sheath. The 0.018" wire was then removed and a 0.035" wire advanced into the IVC. An appropriate location for the subcutaneous reservoir was selected below the clavicle and an incision was made through the skin and underlying soft tissues. The subcutaneous tissues were then dissected using a combination of blunt and sharp surgical technique and a pocket was formed. A Bard Clear Vue single lumen power injectable portacatheter was then tunneled through the subcutaneous tissues from the pocket to the dermatotomy and the port reservoir placed within the subcutaneous pocket. The venous access site was then serially dilated and a peel away vascular  sheath placed over the wire. The wire was removed and the port catheter advanced into position under fluoroscopic guidance. The catheter tip is positioned in the superior cavoatrial junction. This was documented with a spot image. The portacatheter was then tested and found to flush and aspirate well. The port was flushed with saline followed by 100 units/mL heparinized saline. The pocket was then closed in two layers using first subdermal inverted interrupted absorbable sutures followed by a running subcuticular suture. The epidermis was then sealed with Dermabond. The dermatotomy at the venous access site was also closed with Dermabond. IMPRESSION: Successful placement of a right IJ approach Bard Clear Vue port catheter with ultrasound and fluoroscopic guidance. The catheter is ready for use. Electronically Signed   By: Malachy Moan M.D.   On: 02/28/2024 15:28   NM PET Image Initial (PI) Skull Base To Thigh Result Date: 02/23/2024 CLINICAL DATA:  Initial treatment strategy for pancreatic carcinoma. EXAM: NUCLEAR MEDICINE PET SKULL BASE TO THIGH TECHNIQUE:  7.1 mCi F-18 FDG was injected intravenously. Full-ring PET imaging was performed from the skull base to thigh after the radiotracer. CT data was obtained and used for attenuation correction and anatomic localization. Fasting blood glucose: 75 mg/dl COMPARISON:  CT on 12/21/7251 FINDINGS: Mediastinal blood-pool activity (background): SUV max = 1.9 Liver activity (reference): SUV max = N/A NECK:  No hypermetabolic lymph nodes or masses. Incidental CT findings:  None. CHEST: 5 mm left internal mammary chain lymph node is hypermetabolic, with SUV max of 4.3. 5 mm precardiac mediastinal lymph node is also hypermetabolic, with SUV max of 4.0. No suspicious pulmonary nodules seen on CT images. Incidental CT findings:  None. ABDOMEN/PELVIS: 3.8 x 3.6 cm mass in the pancreatic head shows intense hypermetabolism, with SUV max of 15.4, consistent with primary  pancreatic carcinoma. Hypermetabolic lymphadenopathy is seen in the adjacent peripancreatic retroperitoneum, gastrohepatic ligament, porta hepatis, and portacaval space, with 1 index lymph node in the gastrohepatic ligament measuring 2.1 cm with SUV max of 14.2. Numerous hypermetabolic metastases are seen throughout liver, largest occupying nearly the entire lateral segment of the left hepatic lobe this has SUV max of 14.2. A solid mass is seen in the anterior midpole of the right kidney measuring 4.7 x 4.4 cm. This shows mild FDG uptake compared to other disease, with SUV max of 2.9. This favors primary renal cell carcinoma over renal metastasis. No hypermetabolic masses or lymphadenopathy identified within pelvis. Incidental CT findings: Prior hysterectomy. Small amount of free fluid in pelvis. Colonic diverticulosis, without signs of diverticulitis. SKELETON: No focal hypermetabolic bone lesions to suggest skeletal metastasis. Incidental CT findings:  None. IMPRESSION: 3.8 cm hypermetabolic mass in pancreatic head, consistent with primary pancreatic carcinoma. Hypermetabolic abdominal lymphadenopathy, consistent with metastatic disease. Diffuse hypermetabolic liver metastases. 5 mm hypermetabolic left internal mammary and precardiac lymph nodes, consistent with metastatic disease. 4.7 cm solid mass in anterior midpole of right kidney, which shows mild FDG uptake. This favors primary renal cell carcinoma over renal metastasis. Electronically Signed   By: Danae Orleans M.D.   On: 02/23/2024 08:35   US BIOPSY (LIVER) Result Date: 02/20/2024 INDICATION: Multiple liver lesions, mass of the head of the pancreas, right renal mass and prior history of breast carcinoma. Clinical suspicion of metastatic pancreatic carcinoma based on recent imaging. Presenting for liver lesion biopsy. EXAM: ULTRASOUND GUIDED CORE BIOPSY OF LIVER MASS MEDICATIONS: None. ANESTHESIA/SEDATION: Moderate (conscious) sedation was employed during  this procedure. A total of Versed 1.0 mg and Fentanyl 50 mcg was administered intravenously. Moderate Sedation Time: 13 minutes. The patient's level of consciousness and vital signs were monitored continuously by radiology nursing throughout the procedure under my direct supervision. PROCEDURE: The procedure, risks, benefits, and alternatives were explained to the patient. Questions regarding the procedure were encouraged and answered. The patient understands and consents to the procedure. A time-out was performed prior to initiating the procedure. The abdominal wall was prepped with chlorhexidine in a sterile fashion, and a sterile drape was applied covering the operative field. A sterile gown and sterile gloves were used for the procedure. Local anesthesia was provided with 1% Lidocaine. Ultrasound was used to localize a lesion within the left lobe of the liver. Under ultrasound guidance, a 17 gauge trocar needle was advanced into the lesion. Three separate coaxial 18 gauge core biopsy samples were obtained and submitted in formalin. A slurry of Gel-Foam EmboCubes was then injected as the trocar needle was retracted and removed. Additional ultrasound imaging was performed. COMPLICATIONS: None immediate. FINDINGS: Confluent  tumor in the left lobe of the liver was localized measuring nearly 9 cm in maximum diameter. Solid tissue was obtained. IMPRESSION: Ultrasound-guided core biopsy performed of tumor within the left lobe of the liver. Electronically Signed   By: Irish Lack M.D.   On: 02/20/2024 10:00   CT ABDOMEN PELVIS W CONTRAST Result Date: 02/10/2024 CLINICAL DATA:  Left lower quadrant abdominal pain EXAM: CT ABDOMEN AND PELVIS WITH CONTRAST TECHNIQUE: Multidetector CT imaging of the abdomen and pelvis was performed using the standard protocol following bolus administration of intravenous contrast. RADIATION DOSE REDUCTION: This exam was performed according to the departmental dose-optimization program  which includes automated exposure control, adjustment of the mA and/or kV according to patient size and/or use of iterative reconstruction technique. CONTRAST:  75mL OMNIPAQUE IOHEXOL 350 MG/ML SOLN COMPARISON:  None Available. FINDINGS: Lower chest: No acute pleural or parenchymal lung disease. Dense calcification of the mitral valve. Hepatobiliary: There are multiple hypodense lesions within the liver, concerning for metastatic disease. The 2 largest lesions are seen within the left lobe, measuring 4.0 x 3.7 cm and 3.5 x 2.9 cm, reference images 12 and 13 of series 2. Gallbladder is decompressed, with nonspecific gallbladder wall thickening. No pericholecystic fluid or calcified gallstones. No biliary duct dilation. Pancreas: There is a 3.6 x 3.6 cm mass centered within the uncinate process of the pancreas, reference image 24/2, consistent with malignancy. There is upstream pancreatic atrophy and irregular pancreatic duct dilation. No acute inflammatory changes. Spleen: Normal in size without focal abnormality. Adrenals/Urinary Tract: The adrenals are unremarkable. There is a 5.1 x 4.6 x 5.3 cm heterogeneously enhancing mass within the anterior aspect of the right kidney, compatible with renal cell carcinoma. No evidence of extension into the renal pelvis or vascular involvement. Simple cyst upper pole left kidney does not require follow-up. Otherwise the left kidney is unremarkable. The bladder is moderately distended, without filling defect. Stomach/Bowel: No bowel obstruction or ileus. Normal appendix right lower quadrant. No bowel wall thickening or inflammatory change. Vascular/Lymphatic: 3.1 cm infrarenal abdominal aortic aneurysm. Diffuse aortic atherosclerosis. Multiple enlarged lymph nodes are seen within the porta hepatis, consistent with nodal metastases. Largest lymph node reference image 18/2 measures 14 mm in short axis. Reproductive: Prior hysterectomy.  No adnexal masses. Other: Trace pelvic free  fluid. No free intraperitoneal gas. No abdominal wall hernia. Musculoskeletal: There are no acute or destructive bony abnormalities. Reconstructed images demonstrate no additional findings. IMPRESSION: 1. 3.6 cm mass within the uncinate process of the pancreas, consistent with malignancy. Upstream pancreatic atrophy and irregular pancreatic duct dilation. Further evaluation with EUS may be useful. 2. Multiple hypodense masses within the liver, largest in the left lobe, consistent with hepatic metastases. 3. 5.3 cm heterogeneously enhancing right renal mass, consistent with renal cell carcinoma. 4. Pathologic adenopathy within the porta hepatis, consistent with nodal metastases. 5. Trace pelvic free fluid. 6. 3.1 cm infrarenal abdominal aortic aneurysm. Recommend follow-up ultrasound every 3 years. (Ref.: J Vasc Surg. 2018; 67:2-77 and J Am Coll Radiol 2013;10(10):789-794.) 7.  Aortic Atherosclerosis (ICD10-I70.0). Electronically Signed   By: Sharlet Salina M.D.   On: 02/10/2024 17:48     Assessment and plan- Patient is a 73 y.o. female with metastatic pancreatic cancer and liver metastases here for on treatment assessment prior to cycle 1 of modified FOLFIRINOX chemotherapy  Counts okay to proceed with cycle 1 of modified FOLFIRINOX chemotherapy today.  Her white cell count is elevated at 20.3 predominantly neutrophilia.  Clinically no signs and symptoms of  infection.  Probably reactive.  But given her subjective fevers at home I am giving her empiric course of Levaquin for 7 days.  Fever at time can also be seen in the setting of malignancy.  Continue to monitor.  I will see her back in 2 weeks for cycle 2.  Plan is to continue this regimen until progression or toxicity.  Plan to check CA 19-9 again in 1 month's time.  Again discussed risks and benefits of chemotherapy including all but not limited to nausea vomiting low blood counts risk of infections hospitalizations and diarrhea.  Treatment will be given  with a palliative intent.  Patient understands and agrees to proceed as planned.  She will let us know if she has any questions or concerns in the interim  Hyponatremia: I will give her 1 L of IV fluids today.  I have encouraged her to improve her oral intake as well as protein intake and I will refer her to nutrition as well.   Visit Diagnosis 1. Encounter for antineoplastic chemotherapy   2. Malignant neoplasm of other parts of pancreas (HCC)   3. Carcinoma of pancreas metastatic to liver (HCC)   4. Fever, unspecified   5. Hyponatremia      Dr. Owens Shark, MD, MPH [facility] at [facility] [phone] 03/05/2024 8:31 AM

## 2024-03-06 ENCOUNTER — Telehealth: Payer: Self-pay

## 2024-03-06 ENCOUNTER — Inpatient Hospital Stay (HOSPITAL_BASED_OUTPATIENT_CLINIC_OR_DEPARTMENT_OTHER): Payer: PPO | Admitting: Hospice and Palliative Medicine

## 2024-03-06 DIAGNOSIS — C257 Malignant neoplasm of other parts of pancreas: Secondary | ICD-10-CM

## 2024-03-06 NOTE — Progress Notes (Signed)
 Multidisciplinary Oncology Council Documentation  Heather Burke was presented by our [facility] on 03/06/2024, which included representatives from:  Palliative Care Dietitian  Physical/Occupational Therapist Nurse Navigator Genetics Social work Survivorship RN Financial Navigator Research RN   Heather Burke currently presents with history of pancreatic cancer  We reviewed previous medical and familial history, history of present illness, and recent lab results along with all available histopathologic and imaging studies. The MOC considered available treatment options and made the following recommendations/referrals:  Nutrition, SW, recommend PC  The MOC is a meeting of clinicians from various specialty areas who evaluate and discuss patients for whom a multidisciplinary approach is being considered. Final determinations in the plan of care are those of the provider(s).   Today's extended care, comprehensive team conference, Heather Burke was not present for the discussion and was not examined.

## 2024-03-06 NOTE — Telephone Encounter (Signed)
Telephone call to patient for follow up after receiving first infusion.   Patient states infusion went great.  States eating good and drinking plenty of fluids.   Denies any nausea or vomiting.  Encouraged patient to call for any concerns or questions. 

## 2024-03-07 ENCOUNTER — Encounter: Payer: Self-pay | Admitting: *Deleted

## 2024-03-07 ENCOUNTER — Inpatient Hospital Stay

## 2024-03-07 ENCOUNTER — Ambulatory Visit
Admission: RE | Admit: 2024-03-07 | Discharge: 2024-03-07 | Disposition: A | Discharge status: Home / Self Care | Source: Home / Self Care | Attending: Hospice and Palliative Medicine | Admitting: Hospice and Palliative Medicine

## 2024-03-07 ENCOUNTER — Inpatient Hospital Stay (HOSPITAL_BASED_OUTPATIENT_CLINIC_OR_DEPARTMENT_OTHER): Admitting: Hospice and Palliative Medicine

## 2024-03-07 ENCOUNTER — Ambulatory Visit
Admission: RE | Admit: 2024-03-07 | Discharge: 2024-03-07 | Disposition: A | Discharge status: Home / Self Care | Source: Ambulatory Visit | Attending: Hospice and Palliative Medicine | Admitting: Hospice and Palliative Medicine

## 2024-03-07 VITALS — BP 112/47 | HR 67 | Temp 99.0°F | Resp 20

## 2024-03-07 DIAGNOSIS — C257 Malignant neoplasm of other parts of pancreas: Secondary | ICD-10-CM

## 2024-03-07 DIAGNOSIS — R509 Fever, unspecified: Secondary | ICD-10-CM | POA: Diagnosis not present

## 2024-03-07 DIAGNOSIS — Z5111 Encounter for antineoplastic chemotherapy: Secondary | ICD-10-CM | POA: Diagnosis not present

## 2024-03-07 DIAGNOSIS — R059 Cough, unspecified: Secondary | ICD-10-CM | POA: Diagnosis not present

## 2024-03-07 DIAGNOSIS — R6510 Systemic inflammatory response syndrome (SIRS) of non-infectious origin without acute organ dysfunction: Secondary | ICD-10-CM | POA: Diagnosis not present

## 2024-03-07 DIAGNOSIS — J9811 Atelectasis: Secondary | ICD-10-CM | POA: Diagnosis not present

## 2024-03-07 DIAGNOSIS — I10 Essential (primary) hypertension: Secondary | ICD-10-CM | POA: Diagnosis not present

## 2024-03-07 DIAGNOSIS — I959 Hypotension, unspecified: Secondary | ICD-10-CM | POA: Diagnosis not present

## 2024-03-07 DIAGNOSIS — J90 Pleural effusion, not elsewhere classified: Secondary | ICD-10-CM | POA: Diagnosis not present

## 2024-03-07 DIAGNOSIS — A419 Sepsis, unspecified organism: Secondary | ICD-10-CM | POA: Diagnosis not present

## 2024-03-07 LAB — CBC WITH DIFFERENTIAL (CANCER CENTER ONLY)
Abs Immature Granulocytes: 0.47 10*3/uL — ABNORMAL HIGH (ref 0.00–0.07)
Basophils Absolute: 0.1 10*3/uL (ref 0.0–0.1)
Basophils Relative: 0 %
Eosinophils Absolute: 0.5 10*3/uL (ref 0.0–0.5)
Eosinophils Relative: 2 %
HCT: 32.9 % — ABNORMAL LOW (ref 36.0–46.0)
Hemoglobin: 11 g/dL — ABNORMAL LOW (ref 12.0–15.0)
Immature Granulocytes: 2 %
Lymphocytes Relative: 3 %
Lymphs Abs: 0.7 10*3/uL (ref 0.7–4.0)
MCH: 29.1 pg (ref 26.0–34.0)
MCHC: 33.4 g/dL (ref 30.0–36.0)
MCV: 87 fL (ref 80.0–100.0)
Monocytes Absolute: 0.3 10*3/uL (ref 0.1–1.0)
Monocytes Relative: 1 %
Neutro Abs: 21.6 10*3/uL — ABNORMAL HIGH (ref 1.7–7.7)
Neutrophils Relative %: 92 %
Platelet Count: 222 10*3/uL (ref 150–400)
RBC: 3.78 MIL/uL — ABNORMAL LOW (ref 3.87–5.11)
RDW: 14.2 % (ref 11.5–15.5)
WBC Count: 23.7 10*3/uL — ABNORMAL HIGH (ref 4.0–10.5)
nRBC: 0 % (ref 0.0–0.2)

## 2024-03-07 LAB — CMP (CANCER CENTER ONLY)
ALT: 21 U/L (ref 0–44)
AST: 58 U/L — ABNORMAL HIGH (ref 15–41)
Albumin: 1.7 g/dL — ABNORMAL LOW (ref 3.5–5.0)
Alkaline Phosphatase: 118 U/L (ref 38–126)
Anion gap: 9 (ref 5–15)
BUN: 29 mg/dL — ABNORMAL HIGH (ref 8–23)
CO2: 20 mmol/L — ABNORMAL LOW (ref 22–32)
Calcium: 7.2 mg/dL — ABNORMAL LOW (ref 8.9–10.3)
Chloride: 92 mmol/L — ABNORMAL LOW (ref 98–111)
Creatinine: 0.75 mg/dL (ref 0.44–1.00)
GFR, Estimated: >60 mL/min (ref >60)
Glucose, Bld: 121 mg/dL — ABNORMAL HIGH (ref 70–99)
Potassium: 4.3 mmol/L (ref 3.5–5.1)
Sodium: 121 mmol/L — ABNORMAL LOW (ref 135–145)
Total Bilirubin: 1.3 mg/dL — ABNORMAL HIGH (ref 0.0–1.2)
Total Protein: 5.7 g/dL — ABNORMAL LOW (ref 6.5–8.1)

## 2024-03-07 LAB — URINALYSIS, COMPLETE (UACMP) WITH MICROSCOPIC
Bilirubin Urine: NEGATIVE
Glucose, UA: NEGATIVE mg/dL
Hgb urine dipstick: NEGATIVE
Ketones, ur: NEGATIVE mg/dL
Leukocytes,Ua: NEGATIVE
Nitrite: NEGATIVE
Protein, ur: NEGATIVE mg/dL
Specific Gravity, Urine: 1.013 (ref 1.005–1.030)
pH: 5 (ref 5.0–8.0)

## 2024-03-07 LAB — PROCALCITONIN: Procalcitonin: 1.28 ng/mL

## 2024-03-07 MED ORDER — HEPARIN SOD (PORK) LOCK FLUSH 100 UNIT/ML IV SOLN
500.0000 [IU] | Freq: Once | INTRAVENOUS | Status: AC
  Administered 2024-03-07: 500 [IU] via INTRAVENOUS
  Filled 2024-03-07: qty 5

## 2024-03-07 MED ORDER — SODIUM CHLORIDE 0.9% FLUSH
10.0000 mL | INTRAVENOUS | Status: DC | PRN
Start: 2024-03-07 — End: 2024-03-07
  Administered 2024-03-07: 10 mL
  Filled 2024-03-07: qty 10

## 2024-03-07 MED ORDER — HEPARIN SOD (PORK) LOCK FLUSH 100 UNIT/ML IV SOLN
500.0000 [IU] | Freq: Once | INTRAVENOUS | Status: AC | PRN
  Administered 2024-03-07: 500 [IU]
  Filled 2024-03-07: qty 5

## 2024-03-07 MED ORDER — SODIUM CHLORIDE 0.9 % IV SOLN
INTRAVENOUS | Status: DC
  Filled 2024-03-07 (×2): qty 250

## 2024-03-07 NOTE — Progress Notes (Signed)
 Pt ivf completed. Pt states she is feeling better. BP after fluid completion 113/57. Per Sharia Reeve, NP ok to d/c home. Pt to return tomorrow for labs and fluids. Pt stable at discharge. Pt educated on need for emergency care if needed prior to appt tomorrow.

## 2024-03-07 NOTE — Patient Instructions (Signed)
 Dehydration, Adult Dehydration is when you lose more fluids from the body than you take in. Vital organs like the kidneys, brain, and heart cannot function without a proper amount of fluids and salt. Any loss of fluids from the body can cause dehydration.  CAUSES   Vomiting.  Diarrhea.  Excessive sweating.  Excessive urine output.  Fever. SYMPTOMS  Mild dehydration  Thirst.  Dry lips.  Slightly dry mouth. Moderate dehydration  Very dry mouth.  Sunken eyes.  Skin does not bounce back quickly when lightly pinched and released.  Dark urine and decreased urine production.

## 2024-03-07 NOTE — Progress Notes (Signed)
 Pt presented to clinic today for pump d/c.  Pt feeling weak and nauseated.  BP 120/49, HR 79, and Temp 100.2.  Pt reports drinking a shake and 2 bottles of water today.  Pt reported having a fever prior to treatment, MD aware, and pt on abx.  Pump d/c'd and port deaccessed.  Pt reported feeling increasingly lightheaded.  BP rechecked, 71/30.  This RN quickly reaccessed PAC and began IVF bolus.  Josh Borders NP to chairside.  BP rechecked, 82/55 HR 59.  Pt reports feeling better after initiation of IVF.  After approximately 10 minutes IVF, BP 121/52.  Will continue to monitor.

## 2024-03-07 NOTE — Addendum Note (Signed)
 Addended by: Malachy Moan on: 03/07/2024 03:49 PM   Modules accepted: Orders

## 2024-03-07 NOTE — Progress Notes (Signed)
 Symptom Management Clinic [facility] at [facility] [phone] [phone]  Patient Care Team: Sallee Provencal, FNP as PCP - General (Family Medicine) Irene Limbo., MD as Consulting Physician (Ophthalmology) Ronney Asters, Jackelyn Poling, RPH-CPP as Pharmacist Benita Gutter, RN as Oncology Nurse Navigator   NAME OF PATIENT: Heather Burke  [id number]  [birth year]   DATE OF VISIT: 03/07/24  REASON FOR CONSULT: Heather Burke is a 73 y.o. female with multiple medical problems including stage IV pancreatic cancer with liver metastases.  INTERVAL HISTORY: Patient saw Dr. Smith Robert on 03/05/2024 at which time she received day 1, cycle 1 modified FOLFIRINOX chemotherapy.  Patient was noted to have leukocytosis and subjective fevers.  Was started on Levaquin x 7 days.  Patient presents to infusion today to deaccessed for pump and was found to be hypotensive with initial SBP 70s.    Patient reports nausea without vomiting.  Continues to have subjective fever but denies chills.  Denies cough or congestion.  No shortness of breath or chest pain.  No diarrhea or constipation.  Denies urinary symptoms.  Denies rashes.  Reports minimal oral intake.  Denies any neurologic complaints. Denies any easy bleeding or bruising.  Patient offers no further specific complaints today.   PAST MEDICAL HISTORY: Past Medical History:  Diagnosis Date   Breast cancer (HCC) 06/08/2015   T1c, N0;  ER+,PR+, Her 2 neg (FISH neg) x2., SLN x 3 negative. Mammoprint: Low risk. Multifocal.Invasive mammary and lobular carcinoma.    Diabetes mellitus without complication (HCC)    GERD (gastroesophageal reflux disease)    Heart murmur 2016   newly diagnosed, slight murmur   Hyperlipidemia    Hypertension    Hypothyroidism    Lupus    PONV (postoperative nausea and vomiting)    with Hysterectomy   Port-A-Cath in place    Thyroid disease     PAST SURGICAL HISTORY:  Past  Surgical History:  Procedure Laterality Date   ABDOMINAL HYSTERECTOMY     BREAST BIOPSY Right 2002   negative/ Dr. Lemar Livings   BREAST BIOPSY Left 06-08-15   INVASIVE MAMMARY CARCINOMA WITH PARTIAL SOLID PATTERN.    BREAST BIOPSY Right 06/26/2018   FIBROADENOMATOUS CHANGES WITH STROMAL HYALINIZATION AND CALCIFICATIONS   COLONOSCOPY WITH PROPOFOL N/A 09/13/2016   Procedure: COLONOSCOPY WITH PROPOFOL;  Surgeon: Midge Minium, MD;  Location: [facility] ENDOSCOPY;  Service: Endoscopy;  Laterality: N/A;   core biopsy Left 06/08/15   GUM SURGERY  2006   IR IMAGING GUIDED PORT INSERTION  02/28/2024   MASTECTOMY Left 2016   OOPHORECTOMY     SENTINEL NODE BIOPSY Left 08/18/2015   Procedure: SENTINEL NODE BIOPSY;  Surgeon: Earline Mayotte, MD;  Location: [facility] ORS;  Service: General;  Laterality: Left;   SIMPLE MASTECTOMY WITH AXILLARY SENTINEL NODE BIOPSY Left 08/18/2015   Procedure: SIMPLE MASTECTOMY;  Surgeon: Earline Mayotte, MD;  Location: [facility] ORS;  Service: General;  Laterality: Left;   TONSILLECTOMY      HEMATOLOGY/ONCOLOGY HISTORY:  Oncology History  Malignant neoplasm of other parts of pancreas (HCC)  02/23/2024 Initial Diagnosis   Malignant neoplasm of other parts of pancreas (HCC)   02/23/2024 Cancer Staging   Staging form: Exocrine Pancreas, AJCC 8th Edition - Clinical stage from 02/23/2024: Stage IV (cT2, cN2, pM1) - Signed by Creig Hines, MD on 02/23/2024   03/05/2024 -  Chemotherapy   Patient is on Treatment Plan : PANCREAS Modified FOLFIRINOX q14d x 4 cycles  ALLERGIES:  is allergic to metformin and related, amlodipine, atorvastatin, lisinopril, pravastatin, icosapent ethyl (epa ethyl ester) (fish), and nifedipine er.  MEDICATIONS:  Current Outpatient Medications  Medication Sig Dispense Refill   ACCU-CHEK GUIDE TEST test strip USE 1 STIP TO CHECK BLOOD SUGAR IN THE MORNING, AT NOON, AND AT BEDTIME 100 strip 0   Accu-Chek Softclix Lancets lancets 1 EACH BY DOES NOT APPLY ROUTE IN  THE MORNING, AT NOON, AND AT BEDTIME 100 each 0   aspirin 81 MG tablet Take 81 mg by mouth daily. (Patient not taking: Reported on 02/23/2024)     Blood Glucose Monitoring Suppl DEVI 1 each by Does not apply route in the morning, at noon, and at bedtime. May substitute to any manufacturer covered by patient's insurance. 1 each 0   Cholecalciferol 125 MCG (5000 UT) TABS Take 1 tablet by mouth daily.     Continuous Glucose Sensor (FREESTYLE LIBRE 3 PLUS SENSOR) MISC Place 1 sensor on the skin every 15 days. Use to check glucose continuously 2 each 12   dexamethasone (DECADRON) 4 MG tablet Take 2 tablets (8 mg total) by mouth daily. Take 2 tablets daily x 3 days starting the day after chemotherapy. Take with food. 30 tablet 1   diltiazem (DILT-XR) 240 MG 24 hr capsule Take 1 capsule (240 mg total) by mouth daily. 90 capsule 1   fenofibrate (TRICOR) 145 MG tablet Take 1 tablet (145 mg total) by mouth daily. 90 tablet 3   fluticasone (FLONASE) 50 MCG/ACT nasal spray SPRAY 2 SPRAYS INTO EACH NOSTRIL EVERY DAY 48 mL 2   icosapent Ethyl (VASCEPA) 1 g capsule Take 2 capsules (2 g total) by mouth 2 (two) times daily. (Patient not taking: Reported on 02/09/2024) 120 capsule 2   insulin glargine (LANTUS) 100 UNIT/ML Solostar Pen Inject 40 Units into the skin at bedtime. Titrate by 2 units every 3 days for fasting morning blood sugar >150. (Patient taking differently: Inject 18 Units into the skin at bedtime. Titrate by 2 units every 7 days for fasting morning blood sugar >150.) 15 mL 11   Insulin Pen Needle (PEN NEEDLES 31GX5/16") 31G X 8 MM MISC 1 each by Does not apply route daily. 100 each 11   latanoprost (XALATAN) 0.005 % ophthalmic solution 1 drop at bedtime. (Patient not taking: Reported on 02/23/2024)     levofloxacin (LEVAQUIN) 250 MG tablet Take 2 tablets (500 mg total) by mouth daily for 7 days. 14 tablet 0   levothyroxine (SYNTHROID) 88 MCG tablet Take 1 tablet (88 mcg total) by mouth daily. 90 tablet 1    lidocaine-prilocaine (EMLA) cream Apply to affected area once 30 g 3   loperamide (IMODIUM) 2 MG capsule Take 2 tabs by mouth with first loose stool, then 1 tab with each additional loose stool as needed. Do not exceed 8 tabs in a 24-hour period 60 capsule 3   metoprolol (TOPROL-XL) 200 MG 24 hr tablet Take 1 tablet (200 mg total) by mouth daily. 90 tablet 1   mirtazapine (REMERON) 15 MG tablet Take 1 tablet (15 mg total) by mouth at bedtime. 60 tablet 0   Misc Natural Products (T-RELIEF CBD+13 SL) Place 500 mg under the tongue daily. (Patient not taking: Reported on 02/20/2024)     Nutritional Supplements (QUINOA/KALE/HEMP PO) Place 750 mg under the tongue. (Patient not taking: Reported on 02/20/2024)     ondansetron (ZOFRAN) 8 MG tablet Take 1 tablet (8 mg total) by mouth every 8 (eight) hours as  needed for nausea or vomiting. Start on the third day after chemotherapy 30 tablet 1   oxyCODONE (OXY IR/ROXICODONE) 5 MG immediate release tablet Take 1 tablet (5 mg total) by mouth every 4 (four) hours as needed for severe pain (pain score 7-10). 60 tablet 0   potassium chloride (KLOR-CON M10) 10 MEQ tablet Take 1 tablet (10 mEq total) by mouth daily. 90 tablet 1   prochlorperazine (COMPAZINE) 10 MG tablet Take 1 tablet (10 mg total) by mouth every 6 (six) hours as needed for nausea or vomiting (Nausea or vomiting). 30 tablet 1   valsartan-hydrochlorothiazide (DIOVAN-HCT) 320-12.5 MG tablet Take 1 tablet by mouth daily. 90 tablet 3   VYZULTA 0.024 % SOLN Apply to eye.     No current facility-administered medications for this visit.   Facility-Administered Medications Ordered in Other Visits  Medication Dose Route Frequency Provider Last Rate Last Admin   0.9 %  sodium chloride infusion   Intravenous Continuous Creig Hines, MD 999 mL/hr at 03/07/24 1420 New Bag at 03/07/24 1420   sodium chloride flush (NS) 0.9 % injection 10 mL  10 mL Intracatheter PRN Creig Hines, MD   10 mL at 03/07/24 1404     VITAL SIGNS: There were no vitals taken for this visit. There were no vitals filed for this visit.  Estimated body mass index is 24.23 kg/m as calculated from the following:   Height as of 03/05/24: 5\' 3"  (1.6 m).   Weight as of 03/05/24: 136 lb 12.8 oz (62.1 kg).  LABS: CBC:    Component Value Date/Time   WBC 20.3 (H) 03/05/2024 0807   WBC 11.8 (H) 02/20/2024 0758   HGB 11.2 (L) 03/05/2024 0807   HGB 12.3 02/09/2024 1300   HCT 32.5 (L) 03/05/2024 0807   HCT 37.6 02/09/2024 1300   PLT 362 03/05/2024 0807   PLT 445 02/09/2024 1300   MCV 85.8 03/05/2024 0807   MCV 92 02/09/2024 1300   NEUTROABS 16.6 (H) 03/05/2024 0807   NEUTROABS 6.7 02/09/2024 1300   LYMPHSABS 1.3 03/05/2024 0807   LYMPHSABS 1.1 02/09/2024 1300   MONOABS 1.9 (H) 03/05/2024 0807   EOSABS 0.1 03/05/2024 0807   EOSABS 0.1 02/09/2024 1300   BASOSABS 0.1 03/05/2024 0807   BASOSABS 0.0 02/09/2024 1300   Comprehensive Metabolic Panel:    Component Value Date/Time   NA 123 (L) 03/05/2024 0807   NA 128 (L) 08/09/2023 1032   K 3.6 03/05/2024 0807   CL 91 (L) 03/05/2024 0807   CO2 22 03/05/2024 0807   BUN 17 03/05/2024 0807   BUN 8 08/09/2023 1032   CREATININE 0.67 03/05/2024 0807   GLUCOSE 161 (H) 03/05/2024 0807   CALCIUM 7.8 (L) 03/05/2024 0807   AST 63 (H) 03/05/2024 0807   ALT 21 03/05/2024 0807   ALKPHOS 153 (H) 03/05/2024 0807   BILITOT 1.2 03/05/2024 0807   PROT 6.6 03/05/2024 0807   PROT 7.1 08/09/2023 1032   ALBUMIN 1.9 (L) 03/05/2024 0807   ALBUMIN 4.1 08/09/2023 1032    RADIOGRAPHIC STUDIES: IR IMAGING GUIDED PORT INSERTION Result Date: 02/28/2024 INDICATION: 73 year old female with pancreatic cancer in need of durable venous access to allow for chemotherapy. She presents for port catheter placement. EXAM: IMPLANTED PORT A CATH PLACEMENT WITH ULTRASOUND AND FLUOROSCOPIC GUIDANCE MEDICATIONS: None. ANESTHESIA/SEDATION: Versed 1 mg IV; Fentanyl 50 mcg IV; administered by the radiology nurse  Moderate Sedation Time:  10 minutes The patient's vital signs and level of consciousness were monitored during  the procedure by the interventional radiology nurse under my direct supervision. FLUOROSCOPY: Radiation exposure index: 2 mGy, reference air kerma COMPLICATIONS: None immediate. PROCEDURE: The right neck and chest was prepped with chlorhexidine, and draped in the usual sterile fashion using maximum barrier technique (cap and mask, sterile gown, sterile gloves, large sterile sheet, hand hygiene and cutaneous antiseptic). Local anesthesia was attained by infiltration with 1% lidocaine with epinephrine. Ultrasound demonstrated patency of the right internal jugular vein, and this was documented with an image. Under real-time ultrasound guidance, this vein was accessed with a 21 gauge micropuncture needle and image documentation was performed. A small dermatotomy was made at the access site with an 11 scalpel. A 0.018" wire was advanced into the SVC and the access needle exchanged for a 68F micropuncture vascular sheath. The 0.018" wire was then removed and a 0.035" wire advanced into the IVC. An appropriate location for the subcutaneous reservoir was selected below the clavicle and an incision was made through the skin and underlying soft tissues. The subcutaneous tissues were then dissected using a combination of blunt and sharp surgical technique and a pocket was formed. A Bard Clear Vue single lumen power injectable portacatheter was then tunneled through the subcutaneous tissues from the pocket to the dermatotomy and the port reservoir placed within the subcutaneous pocket. The venous access site was then serially dilated and a peel away vascular sheath placed over the wire. The wire was removed and the port catheter advanced into position under fluoroscopic guidance. The catheter tip is positioned in the superior cavoatrial junction. This was documented with a spot image. The portacatheter was then tested  and found to flush and aspirate well. The port was flushed with saline followed by 100 units/mL heparinized saline. The pocket was then closed in two layers using first subdermal inverted interrupted absorbable sutures followed by a running subcuticular suture. The epidermis was then sealed with Dermabond. The dermatotomy at the venous access site was also closed with Dermabond. IMPRESSION: Successful placement of a right IJ approach Bard Clear Vue port catheter with ultrasound and fluoroscopic guidance. The catheter is ready for use. Electronically Signed   By: Malachy Moan M.D.   On: 02/28/2024 15:28   NM PET Image Initial (PI) Skull Base To Thigh Result Date: 02/23/2024 CLINICAL DATA:  Initial treatment strategy for pancreatic carcinoma. EXAM: NUCLEAR MEDICINE PET SKULL BASE TO THIGH TECHNIQUE: 7.1 mCi F-18 FDG was injected intravenously. Full-ring PET imaging was performed from the skull base to thigh after the radiotracer. CT data was obtained and used for attenuation correction and anatomic localization. Fasting blood glucose: 75 mg/dl COMPARISON:  CT on 21/30/8657 FINDINGS: Mediastinal blood-pool activity (background): SUV max = 1.9 Liver activity (reference): SUV max = N/A NECK:  No hypermetabolic lymph nodes or masses. Incidental CT findings:  None. CHEST: 5 mm left internal mammary chain lymph node is hypermetabolic, with SUV max of 4.3. 5 mm precardiac mediastinal lymph node is also hypermetabolic, with SUV max of 4.0. No suspicious pulmonary nodules seen on CT images. Incidental CT findings:  None. ABDOMEN/PELVIS: 3.8 x 3.6 cm mass in the pancreatic head shows intense hypermetabolism, with SUV max of 15.4, consistent with primary pancreatic carcinoma. Hypermetabolic lymphadenopathy is seen in the adjacent peripancreatic retroperitoneum, gastrohepatic ligament, porta hepatis, and portacaval space, with 1 index lymph node in the gastrohepatic ligament measuring 2.1 cm with SUV max of 14.2. Numerous  hypermetabolic metastases are seen throughout liver, largest occupying nearly the entire lateral segment of the left hepatic  lobe this has SUV max of 14.2. A solid mass is seen in the anterior midpole of the right kidney measuring 4.7 x 4.4 cm. This shows mild FDG uptake compared to other disease, with SUV max of 2.9. This favors primary renal cell carcinoma over renal metastasis. No hypermetabolic masses or lymphadenopathy identified within pelvis. Incidental CT findings: Prior hysterectomy. Small amount of free fluid in pelvis. Colonic diverticulosis, without signs of diverticulitis. SKELETON: No focal hypermetabolic bone lesions to suggest skeletal metastasis. Incidental CT findings:  None. IMPRESSION: 3.8 cm hypermetabolic mass in pancreatic head, consistent with primary pancreatic carcinoma. Hypermetabolic abdominal lymphadenopathy, consistent with metastatic disease. Diffuse hypermetabolic liver metastases. 5 mm hypermetabolic left internal mammary and precardiac lymph nodes, consistent with metastatic disease. 4.7 cm solid mass in anterior midpole of right kidney, which shows mild FDG uptake. This favors primary renal cell carcinoma over renal metastasis. Electronically Signed   By: Danae Orleans M.D.   On: 02/23/2024 08:35   US BIOPSY (LIVER) Result Date: 02/20/2024 INDICATION: Multiple liver lesions, mass of the head of the pancreas, right renal mass and prior history of breast carcinoma. Clinical suspicion of metastatic pancreatic carcinoma based on recent imaging. Presenting for liver lesion biopsy. EXAM: ULTRASOUND GUIDED CORE BIOPSY OF LIVER MASS MEDICATIONS: None. ANESTHESIA/SEDATION: Moderate (conscious) sedation was employed during this procedure. A total of Versed 1.0 mg and Fentanyl 50 mcg was administered intravenously. Moderate Sedation Time: 13 minutes. The patient's level of consciousness and vital signs were monitored continuously by radiology nursing throughout the procedure under my direct  supervision. PROCEDURE: The procedure, risks, benefits, and alternatives were explained to the patient. Questions regarding the procedure were encouraged and answered. The patient understands and consents to the procedure. A time-out was performed prior to initiating the procedure. The abdominal wall was prepped with chlorhexidine in a sterile fashion, and a sterile drape was applied covering the operative field. A sterile gown and sterile gloves were used for the procedure. Local anesthesia was provided with 1% Lidocaine. Ultrasound was used to localize a lesion within the left lobe of the liver. Under ultrasound guidance, a 17 gauge trocar needle was advanced into the lesion. Three separate coaxial 18 gauge core biopsy samples were obtained and submitted in formalin. A slurry of Gel-Foam EmboCubes was then injected as the trocar needle was retracted and removed. Additional ultrasound imaging was performed. COMPLICATIONS: None immediate. FINDINGS: Confluent tumor in the left lobe of the liver was localized measuring nearly 9 cm in maximum diameter. Solid tissue was obtained. IMPRESSION: Ultrasound-guided core biopsy performed of tumor within the left lobe of the liver. Electronically Signed   By: Irish Lack M.D.   On: 02/20/2024 10:00   CT ABDOMEN PELVIS W CONTRAST Result Date: 02/10/2024 CLINICAL DATA:  Left lower quadrant abdominal pain EXAM: CT ABDOMEN AND PELVIS WITH CONTRAST TECHNIQUE: Multidetector CT imaging of the abdomen and pelvis was performed using the standard protocol following bolus administration of intravenous contrast. RADIATION DOSE REDUCTION: This exam was performed according to the departmental dose-optimization program which includes automated exposure control, adjustment of the mA and/or kV according to patient size and/or use of iterative reconstruction technique. CONTRAST:  75mL OMNIPAQUE IOHEXOL 350 MG/ML SOLN COMPARISON:  None Available. FINDINGS: Lower chest: No acute pleural or  parenchymal lung disease. Dense calcification of the mitral valve. Hepatobiliary: There are multiple hypodense lesions within the liver, concerning for metastatic disease. The 2 largest lesions are seen within the left lobe, measuring 4.0 x 3.7 cm and 3.5 x 2.9  cm, reference images 12 and 13 of series 2. Gallbladder is decompressed, with nonspecific gallbladder wall thickening. No pericholecystic fluid or calcified gallstones. No biliary duct dilation. Pancreas: There is a 3.6 x 3.6 cm mass centered within the uncinate process of the pancreas, reference image 24/2, consistent with malignancy. There is upstream pancreatic atrophy and irregular pancreatic duct dilation. No acute inflammatory changes. Spleen: Normal in size without focal abnormality. Adrenals/Urinary Tract: The adrenals are unremarkable. There is a 5.1 x 4.6 x 5.3 cm heterogeneously enhancing mass within the anterior aspect of the right kidney, compatible with renal cell carcinoma. No evidence of extension into the renal pelvis or vascular involvement. Simple cyst upper pole left kidney does not require follow-up. Otherwise the left kidney is unremarkable. The bladder is moderately distended, without filling defect. Stomach/Bowel: No bowel obstruction or ileus. Normal appendix right lower quadrant. No bowel wall thickening or inflammatory change. Vascular/Lymphatic: 3.1 cm infrarenal abdominal aortic aneurysm. Diffuse aortic atherosclerosis. Multiple enlarged lymph nodes are seen within the porta hepatis, consistent with nodal metastases. Largest lymph node reference image 18/2 measures 14 mm in short axis. Reproductive: Prior hysterectomy.  No adnexal masses. Other: Trace pelvic free fluid. No free intraperitoneal gas. No abdominal wall hernia. Musculoskeletal: There are no acute or destructive bony abnormalities. Reconstructed images demonstrate no additional findings. IMPRESSION: 1. 3.6 cm mass within the uncinate process of the pancreas, consistent  with malignancy. Upstream pancreatic atrophy and irregular pancreatic duct dilation. Further evaluation with EUS may be useful. 2. Multiple hypodense masses within the liver, largest in the left lobe, consistent with hepatic metastases. 3. 5.3 cm heterogeneously enhancing right renal mass, consistent with renal cell carcinoma. 4. Pathologic adenopathy within the porta hepatis, consistent with nodal metastases. 5. Trace pelvic free fluid. 6. 3.1 cm infrarenal abdominal aortic aneurysm. Recommend follow-up ultrasound every 3 years. (Ref.: J Vasc Surg. 2018; 67:2-77 and J Am Coll Radiol 2013;10(10):789-794.) 7.  Aortic Atherosclerosis (ICD10-I70.0). Electronically Signed   By: Sharlet Salina M.D.   On: 02/10/2024 17:48    PERFORMANCE STATUS (ECOG) : 2 - Symptomatic, <50% confined to bed  Review of Systems Unless otherwise noted, a complete review of systems is negative.  Physical Exam General: Frail appearing Cardiovascular: regular rate and rhythm Pulmonary: clear ant fields Abdomen: soft, nontender, + bowel sounds GU: no suprapubic tenderness Extremities: no edema, no joint deformities Skin: no rashes Neurological: Weakness but otherwise nonfocal  IMPRESSION/PLAN: Stage IV pancreatic cancer -on modified FOLFIRINOX  Hypotension-patient started on IV fluids and rapidly became normotensive (BP 121/52) and felt better.  It is possible that part of her symptoms are attributed to vasovagal response as it occurred when patient's port was deaccessed. However, temp is 100.2 today, which could reflect tumor vs infection.  Patient is empirically on Levaquin but I am concerned that she could meet sirs/sepsis criteria with previous low BP and recent leukocytosis.  Transfer to the emergency department was encouraged but patient wants to go home.  Discussed with Dr. Smith Robert and will proceed with workup including blood cultures, procalcitonin, CBC, CMP, UA/culture, and chest x-ray.  Patient and daughter again  recommended ED evaluation for any change in symptoms or decline.  Will bring patient back to clinic tomorrow for repeat labs/possible fluids  Case and plan discussed with Dr. Smith Robert   Patient expressed understanding and was in agreement with this plan. She also understands that She can call clinic at any time with any questions, concerns, or complaints.   Thank you for allowing me to  participate in the care of this very pleasant patient.   Time Total: 25 minutes  Visit consisted of counseling and education dealing with the complex and emotionally intense issues of symptom management in the setting of serious illness.Greater than 50%  of this time was spent counseling and coordinating care related to the above assessment and plan.  Signed by: Laurette Schimke, PhD, NP-C

## 2024-03-08 ENCOUNTER — Telehealth: Payer: Self-pay | Admitting: *Deleted

## 2024-03-08 ENCOUNTER — Other Ambulatory Visit: Payer: Self-pay

## 2024-03-08 ENCOUNTER — Emergency Department

## 2024-03-08 ENCOUNTER — Inpatient Hospital Stay: Admitting: Hospice and Palliative Medicine

## 2024-03-08 ENCOUNTER — Inpatient Hospital Stay
Admission: EM | Admit: 2024-03-08 | Discharge: 2024-03-16 | DRG: 435 | Disposition: A | Discharge status: Home / Self Care | Source: Ambulatory Visit | Attending: Internal Medicine | Admitting: Internal Medicine

## 2024-03-08 ENCOUNTER — Inpatient Hospital Stay

## 2024-03-08 ENCOUNTER — Ambulatory Visit

## 2024-03-08 VITALS — BP 107/58 | HR 57 | Temp 94.6°F | Resp 16 | Wt 146.0 lb

## 2024-03-08 DIAGNOSIS — E1159 Type 2 diabetes mellitus with other circulatory complications: Secondary | ICD-10-CM | POA: Diagnosis not present

## 2024-03-08 DIAGNOSIS — Z90710 Acquired absence of both cervix and uterus: Secondary | ICD-10-CM

## 2024-03-08 DIAGNOSIS — A419 Sepsis, unspecified organism: Principal | ICD-10-CM

## 2024-03-08 DIAGNOSIS — R188 Other ascites: Secondary | ICD-10-CM | POA: Diagnosis not present

## 2024-03-08 DIAGNOSIS — Z9012 Acquired absence of left breast and nipple: Secondary | ICD-10-CM

## 2024-03-08 DIAGNOSIS — C259 Malignant neoplasm of pancreas, unspecified: Secondary | ICD-10-CM

## 2024-03-08 DIAGNOSIS — E876 Hypokalemia: Secondary | ICD-10-CM | POA: Diagnosis not present

## 2024-03-08 DIAGNOSIS — E860 Dehydration: Secondary | ICD-10-CM | POA: Diagnosis not present

## 2024-03-08 DIAGNOSIS — Z803 Family history of malignant neoplasm of breast: Secondary | ICD-10-CM

## 2024-03-08 DIAGNOSIS — R197 Diarrhea, unspecified: Secondary | ICD-10-CM | POA: Diagnosis not present

## 2024-03-08 DIAGNOSIS — E8809 Other disorders of plasma-protein metabolism, not elsewhere classified: Secondary | ICD-10-CM | POA: Diagnosis present

## 2024-03-08 DIAGNOSIS — D701 Agranulocytosis secondary to cancer chemotherapy: Secondary | ICD-10-CM | POA: Diagnosis present

## 2024-03-08 DIAGNOSIS — E039 Hypothyroidism, unspecified: Secondary | ICD-10-CM

## 2024-03-08 DIAGNOSIS — C787 Secondary malignant neoplasm of liver and intrahepatic bile duct: Secondary | ICD-10-CM | POA: Diagnosis present

## 2024-03-08 DIAGNOSIS — E440 Moderate protein-calorie malnutrition: Secondary | ICD-10-CM | POA: Diagnosis present

## 2024-03-08 DIAGNOSIS — F1020 Alcohol dependence, uncomplicated: Secondary | ICD-10-CM

## 2024-03-08 DIAGNOSIS — C257 Malignant neoplasm of other parts of pancreas: Secondary | ICD-10-CM | POA: Diagnosis not present

## 2024-03-08 DIAGNOSIS — D61810 Antineoplastic chemotherapy induced pancytopenia: Secondary | ICD-10-CM | POA: Diagnosis not present

## 2024-03-08 DIAGNOSIS — F32A Depression, unspecified: Secondary | ICD-10-CM | POA: Diagnosis not present

## 2024-03-08 DIAGNOSIS — J90 Pleural effusion, not elsewhere classified: Secondary | ICD-10-CM | POA: Diagnosis not present

## 2024-03-08 DIAGNOSIS — Z8249 Family history of ischemic heart disease and other diseases of the circulatory system: Secondary | ICD-10-CM

## 2024-03-08 DIAGNOSIS — E119 Type 2 diabetes mellitus without complications: Secondary | ICD-10-CM

## 2024-03-08 DIAGNOSIS — F17210 Nicotine dependence, cigarettes, uncomplicated: Secondary | ICD-10-CM | POA: Diagnosis present

## 2024-03-08 DIAGNOSIS — D709 Neutropenia, unspecified: Secondary | ICD-10-CM | POA: Diagnosis not present

## 2024-03-08 DIAGNOSIS — Z681 Body mass index (BMI) 19 or less, adult: Secondary | ICD-10-CM | POA: Diagnosis not present

## 2024-03-08 DIAGNOSIS — Z853 Personal history of malignant neoplasm of breast: Secondary | ICD-10-CM

## 2024-03-08 DIAGNOSIS — I5032 Chronic diastolic (congestive) heart failure: Secondary | ICD-10-CM

## 2024-03-08 DIAGNOSIS — Z5111 Encounter for antineoplastic chemotherapy: Secondary | ICD-10-CM | POA: Diagnosis not present

## 2024-03-08 DIAGNOSIS — Z794 Long term (current) use of insulin: Secondary | ICD-10-CM

## 2024-03-08 DIAGNOSIS — T451X5A Adverse effect of antineoplastic and immunosuppressive drugs, initial encounter: Secondary | ICD-10-CM | POA: Diagnosis not present

## 2024-03-08 DIAGNOSIS — R509 Fever, unspecified: Secondary | ICD-10-CM | POA: Diagnosis not present

## 2024-03-08 DIAGNOSIS — Z1721 Progesterone receptor positive status: Secondary | ICD-10-CM

## 2024-03-08 DIAGNOSIS — Z1152 Encounter for screening for COVID-19: Secondary | ICD-10-CM | POA: Diagnosis not present

## 2024-03-08 DIAGNOSIS — Z79890 Hormone replacement therapy (postmenopausal): Secondary | ICD-10-CM

## 2024-03-08 DIAGNOSIS — Z888 Allergy status to other drugs, medicaments and biological substances status: Secondary | ICD-10-CM

## 2024-03-08 DIAGNOSIS — E1129 Type 2 diabetes mellitus with other diabetic kidney complication: Secondary | ICD-10-CM

## 2024-03-08 DIAGNOSIS — I152 Hypertension secondary to endocrine disorders: Secondary | ICD-10-CM

## 2024-03-08 DIAGNOSIS — R6510 Systemic inflammatory response syndrome (SIRS) of non-infectious origin without acute organ dysfunction: Secondary | ICD-10-CM | POA: Diagnosis not present

## 2024-03-08 DIAGNOSIS — E872 Acidosis, unspecified: Secondary | ICD-10-CM | POA: Diagnosis not present

## 2024-03-08 DIAGNOSIS — E785 Hyperlipidemia, unspecified: Secondary | ICD-10-CM

## 2024-03-08 DIAGNOSIS — E871 Hypo-osmolality and hyponatremia: Secondary | ICD-10-CM

## 2024-03-08 DIAGNOSIS — R5081 Fever presenting with conditions classified elsewhere: Secondary | ICD-10-CM | POA: Diagnosis not present

## 2024-03-08 DIAGNOSIS — J9811 Atelectasis: Secondary | ICD-10-CM | POA: Diagnosis not present

## 2024-03-08 DIAGNOSIS — I10 Essential (primary) hypertension: Secondary | ICD-10-CM | POA: Diagnosis not present

## 2024-03-08 DIAGNOSIS — Z881 Allergy status to other antibiotic agents status: Secondary | ICD-10-CM

## 2024-03-08 DIAGNOSIS — Z170 Estrogen receptor positive status [ER+]: Secondary | ICD-10-CM

## 2024-03-08 LAB — CMP (CANCER CENTER ONLY)
ALT: 19 U/L (ref 0–44)
AST: 45 U/L — ABNORMAL HIGH (ref 15–41)
Albumin: 1.6 g/dL — ABNORMAL LOW (ref 3.5–5.0)
Alkaline Phosphatase: 102 U/L (ref 38–126)
Anion gap: 8 (ref 5–15)
BUN: 35 mg/dL — ABNORMAL HIGH (ref 8–23)
CO2: 20 mmol/L — ABNORMAL LOW (ref 22–32)
Calcium: 7.5 mg/dL — ABNORMAL LOW (ref 8.9–10.3)
Chloride: 91 mmol/L — ABNORMAL LOW (ref 98–111)
Creatinine: 0.71 mg/dL (ref 0.44–1.00)
GFR, Estimated: >60 mL/min (ref >60)
Glucose, Bld: 146 mg/dL — ABNORMAL HIGH (ref 70–99)
Potassium: 4.4 mmol/L (ref 3.5–5.1)
Sodium: 120 mmol/L — ABNORMAL LOW (ref 135–145)
Total Bilirubin: 1.6 mg/dL — ABNORMAL HIGH (ref 0.0–1.2)
Total Protein: 5.5 g/dL — ABNORMAL LOW (ref 6.5–8.1)

## 2024-03-08 LAB — CBC WITH DIFFERENTIAL (CANCER CENTER ONLY)
Abs Immature Granulocytes: 0.65 10*3/uL — ABNORMAL HIGH (ref 0.00–0.07)
Basophils Absolute: 0.1 10*3/uL (ref 0.0–0.1)
Basophils Relative: 0 %
Eosinophils Absolute: 0 10*3/uL (ref 0.0–0.5)
Eosinophils Relative: 0 %
HCT: 30.7 % — ABNORMAL LOW (ref 36.0–46.0)
Hemoglobin: 10.7 g/dL — ABNORMAL LOW (ref 12.0–15.0)
Immature Granulocytes: 3 %
Lymphocytes Relative: 4 %
Lymphs Abs: 1 10*3/uL (ref 0.7–4.0)
MCH: 29.4 pg (ref 26.0–34.0)
MCHC: 34.9 g/dL (ref 30.0–36.0)
MCV: 84.3 fL (ref 80.0–100.0)
Monocytes Absolute: 0.1 10*3/uL (ref 0.1–1.0)
Monocytes Relative: 0 %
Neutro Abs: 21.8 10*3/uL — ABNORMAL HIGH (ref 1.7–7.7)
Neutrophils Relative %: 93 %
Platelet Count: 155 10*3/uL (ref 150–400)
RBC: 3.64 MIL/uL — ABNORMAL LOW (ref 3.87–5.11)
RDW: 13.9 % (ref 11.5–15.5)
WBC Count: 23.7 10*3/uL — ABNORMAL HIGH (ref 4.0–10.5)
nRBC: 0 % (ref 0.0–0.2)

## 2024-03-08 LAB — URINALYSIS, ROUTINE W REFLEX MICROSCOPIC
Bacteria, UA: NONE SEEN
Bilirubin Urine: NEGATIVE
Glucose, UA: NEGATIVE mg/dL
Hgb urine dipstick: NEGATIVE
Ketones, ur: NEGATIVE mg/dL
Nitrite: NEGATIVE
Protein, ur: NEGATIVE mg/dL
Specific Gravity, Urine: 1.016 (ref 1.005–1.030)
pH: 5 (ref 5.0–8.0)

## 2024-03-08 LAB — MAGNESIUM: Magnesium: 1.8 mg/dL (ref 1.7–2.4)

## 2024-03-08 LAB — URINE CULTURE: Culture: NO GROWTH

## 2024-03-08 LAB — PHOSPHORUS: Phosphorus: 3 mg/dL (ref 2.5–4.6)

## 2024-03-08 LAB — RESP PANEL BY RT-PCR (RSV, FLU A&B, COVID)  RVPGX2
Influenza A by PCR: NEGATIVE
Influenza B by PCR: NEGATIVE
Resp Syncytial Virus by PCR: NEGATIVE
SARS Coronavirus 2 by RT PCR: NEGATIVE

## 2024-03-08 LAB — CBG MONITORING, ED: Glucose-Capillary: 127 mg/dL — ABNORMAL HIGH (ref 70–99)

## 2024-03-08 LAB — SODIUM, URINE, RANDOM: Sodium, Ur: <10 mmol/L

## 2024-03-08 LAB — LACTIC ACID, PLASMA: Lactic Acid, Venous: 1.6 mmol/L (ref 0.5–1.9)

## 2024-03-08 LAB — OSMOLALITY, URINE: Osmolality, Ur: 557 mosm/kg (ref 300–900)

## 2024-03-08 LAB — PROCALCITONIN: Procalcitonin: 3.5 ng/mL

## 2024-03-08 MED ORDER — HEPARIN SOD (PORK) LOCK FLUSH 100 UNIT/ML IV SOLN
500.0000 [IU] | Freq: Once | INTRAVENOUS | Status: DC
  Filled 2024-03-08: qty 5

## 2024-03-08 MED ORDER — SODIUM CHLORIDE 0.9% FLUSH
10.0000 mL | Freq: Once | INTRAVENOUS | Status: AC
  Administered 2024-03-08: 10 mL via INTRAVENOUS
  Filled 2024-03-08: qty 10

## 2024-03-08 MED ORDER — INSULIN ASPART 100 UNIT/ML IJ SOLN: 0.0000 [IU] | Freq: Every day | INTRAMUSCULAR | Status: DC

## 2024-03-08 MED ORDER — PIPERACILLIN-TAZOBACTAM 3.375 G IVPB
3.3750 g | Freq: Three times a day (TID) | INTRAVENOUS | Status: DC
  Administered 2024-03-09 – 2024-03-12 (×10): 3.375 g via INTRAVENOUS
  Filled 2024-03-08 (×10): qty 50

## 2024-03-08 MED ORDER — SODIUM CHLORIDE 0.9 % IV SOLN
2.0000 g | Freq: Once | INTRAVENOUS | Status: AC
  Administered 2024-03-08: 2 g via INTRAVENOUS
  Filled 2024-03-08: qty 12.5

## 2024-03-08 MED ORDER — SODIUM CHLORIDE 0.9 % IV SOLN
INTRAVENOUS | Status: DC
  Administered 2024-03-08: 75 mL via INTRAVENOUS

## 2024-03-08 MED ORDER — VANCOMYCIN HCL 500 MG/100ML IV SOLN
500.0000 mg | Freq: Once | INTRAVENOUS | Status: AC
  Administered 2024-03-08: 500 mg via INTRAVENOUS
  Filled 2024-03-08: qty 100

## 2024-03-08 MED ORDER — ENOXAPARIN SODIUM 40 MG/0.4ML IJ SOSY
40.0000 mg | PREFILLED_SYRINGE | INTRAMUSCULAR | Status: DC
  Administered 2024-03-08 – 2024-03-10 (×3): 40 mg via SUBCUTANEOUS
  Filled 2024-03-08 (×3): qty 0.4

## 2024-03-08 MED ORDER — VANCOMYCIN HCL 1250 MG/250ML IV SOLN
1250.0000 mg | Freq: Once | INTRAVENOUS | Status: DC
  Filled 2024-03-08: qty 250

## 2024-03-08 MED ORDER — SODIUM CHLORIDE 0.9 % IV BOLUS
500.0000 mL | Freq: Once | INTRAVENOUS | Status: AC
  Administered 2024-03-08: 500 mL via INTRAVENOUS

## 2024-03-08 MED ORDER — SODIUM CHLORIDE 1 G PO TABS
1.0000 g | ORAL_TABLET | Freq: Two times a day (BID) | ORAL | Status: DC
  Administered 2024-03-08 – 2024-03-09 (×2): 1 g via ORAL
  Filled 2024-03-08 (×2): qty 1

## 2024-03-08 MED ORDER — VANCOMYCIN HCL IN DEXTROSE 1-5 GM/200ML-% IV SOLN
1000.0000 mg | Freq: Once | INTRAVENOUS | Status: AC
  Administered 2024-03-08: 1000 mg via INTRAVENOUS
  Filled 2024-03-08: qty 200

## 2024-03-08 MED ORDER — INSULIN ASPART 100 UNIT/ML IJ SOLN
0.0000 [IU] | Freq: Three times a day (TID) | INTRAMUSCULAR | Status: DC
  Administered 2024-03-09: 2 [IU] via SUBCUTANEOUS
  Administered 2024-03-09: 1 [IU] via SUBCUTANEOUS
  Administered 2024-03-11 – 2024-03-12 (×2): 2 [IU] via SUBCUTANEOUS
  Administered 2024-03-13 (×2): 1 [IU] via SUBCUTANEOUS
  Administered 2024-03-14 – 2024-03-15 (×2): 2 [IU] via SUBCUTANEOUS
  Filled 2024-03-08 (×8): qty 1

## 2024-03-08 MED ORDER — NICOTINE 21 MG/24HR TD PT24
21.0000 mg | MEDICATED_PATCH | Freq: Every day | TRANSDERMAL | Status: DC
  Administered 2024-03-08 – 2024-03-16 (×9): 21 mg via TRANSDERMAL
  Filled 2024-03-08 (×9): qty 1

## 2024-03-08 MED ORDER — ONDANSETRON HCL 4 MG/2ML IJ SOLN: 4.0000 mg | Freq: Three times a day (TID) | INTRAMUSCULAR | Status: DC | PRN

## 2024-03-08 MED ORDER — ACETAMINOPHEN 325 MG PO TABS
650.0000 mg | ORAL_TABLET | Freq: Four times a day (QID) | ORAL | Status: DC | PRN
  Administered 2024-03-12 – 2024-03-14 (×2): 650 mg via ORAL
  Filled 2024-03-08 (×2): qty 2

## 2024-03-08 MED ORDER — LACTATED RINGERS IV BOLUS: 1000.0000 mL | Freq: Once | INTRAVENOUS | Status: DC

## 2024-03-08 MED ORDER — METRONIDAZOLE 500 MG/100ML IV SOLN
500.0000 mg | Freq: Once | INTRAVENOUS | Status: AC
  Administered 2024-03-08: 500 mg via INTRAVENOUS
  Filled 2024-03-08: qty 100

## 2024-03-08 MED ORDER — HYDROCORTISONE SOD SUC (PF) 100 MG IJ SOLR
100.0000 mg | Freq: Once | INTRAMUSCULAR | Status: AC
  Administered 2024-03-08: 100 mg via INTRAVENOUS
  Filled 2024-03-08: qty 2

## 2024-03-08 NOTE — Telephone Encounter (Signed)
 The pt. Has port and she will need to put emla cream and possible fluids. The daughter will put it on the cream

## 2024-03-08 NOTE — ED Provider Notes (Signed)
 [facility] Provider Note    Event Date/Time   First MD Initiated Contact with Patient 03/08/24 1604     (approximate)   History   Chief Complaint Fever   HPI  Heather Burke is a 73 y.o. female with past medical history of hypertension, hyperlipidemia, diabetes, alcohol abuse, and pancreatic cancer who presents to the ED complaining of fever.  Patient reports that she was diagnosed with metastatic pancreatic cancer earlier this month, has been dealing with intermittent fevers for the past 1 to 2 weeks.  She states that fever was again noted last night, when it got as high as 102.0.  She has been feeling generally weak after starting chemotherapy earlier this week, reports swelling across her abdomen as well as both of her legs.  She denies any cough, congestion, shortness of breath, abdominal pain, dysuria, or flank pain.  She does report feeling nauseous with decreased appetite and some diarrhea, has not had any blood in her stool and has not vomited.  She went to follow-up with her oncologist earlier today, was found to be hypothermic and referred to the ED for further evaluation.     Physical Exam   Triage Vital Signs: ED Triage Vitals  Encounter Vitals Group     BP 03/08/24 1448 107/66     Systolic BP Percentile --      Diastolic BP Percentile --      Pulse Rate 03/08/24 1448 (!) 56     Resp 03/08/24 1448 17     Temp 03/08/24 1448 (!) 94.2 F (34.6 C)     Temp Source 03/08/24 1448 Oral     SpO2 03/08/24 1448 98 %     Weight --      Height --      Head Circumference --      Peak Flow --      Pain Score 03/08/24 1453 0     Pain Loc --      Pain Education --      Exclude from Growth Chart --     Most recent vital signs: Vitals:   03/08/24 1630 03/08/24 1730  BP: 119/60 (!) 119/53  Pulse:  (!) 58  Resp:  19  Temp:    SpO2: 96% 96%    Constitutional: Alert and oriented. Eyes: Conjunctivae are normal. Head: Atraumatic. Nose: No  congestion/rhinnorhea. Mouth/Throat: Mucous membranes are moist.  Cardiovascular: Normal rate, regular rhythm. Grossly normal heart sounds.  2+ radial pulses bilaterally. Respiratory: Normal respiratory effort.  No retractions. Lungs CTAB. Gastrointestinal: Soft and mildly distended with no tenderness. Musculoskeletal: No lower extremity tenderness, 1+ pitting edema to knees bilaterally. Neurologic:  Normal speech and language. No gross focal neurologic deficits are appreciated.    ED Results / Procedures / Treatments   Labs (all labs ordered are listed, but only abnormal results are displayed) Labs Reviewed  URINALYSIS, ROUTINE W REFLEX MICROSCOPIC - Abnormal; Notable for the following components:      Result Value   Color, Urine YELLOW (*)    APPearance CLEAR (*)    Leukocytes,Ua TRACE (*)    Non Squamous Epithelial PRESENT (*)    All other components within normal limits  RESP PANEL BY RT-PCR (RSV, FLU A&B, COVID)  RVPGX2  CULTURE, BLOOD (ROUTINE X 2)  CULTURE, BLOOD (ROUTINE X 2)  LACTIC ACID, PLASMA  PROCALCITONIN  MAGNESIUM  BRAIN NATRIURETIC PEPTIDE  PHOSPHORUS     EKG  ED ECG REPORT I, Chesley Noon, the  attending physician, personally viewed and interpreted this ECG.   Date: 03/08/2024  EKG Time: 17:29  Rate: 58  Rhythm: normal sinus rhythm  Axis: Normal  Intervals:none  ST&T Change: None  RADIOLOGY Chest x-ray reviewed and interpreted by me with no infiltrate, edema, or effusion.  PROCEDURES:  Critical Care performed: No  Procedures   MEDICATIONS ORDERED IN ED: Medications  vancomycin (VANCOCIN) IVPB 1000 mg/200 mL premix (1,000 mg Intravenous New Bag/Given 03/08/24 1831)  sodium chloride 0.9 % bolus 500 mL (has no administration in time range)  0.9 %  sodium chloride infusion (has no administration in time range)  ondansetron (ZOFRAN) injection 4 mg (has no administration in time range)  acetaminophen (TYLENOL) tablet 650 mg (has no  administration in time range)  nicotine (NICODERM CQ - dosed in mg/24 hours) patch 21 mg (has no administration in time range)  insulin aspart (novoLOG) injection 0-9 Units (has no administration in time range)  insulin aspart (novoLOG) injection 0-5 Units (has no administration in time range)  sodium chloride tablet 1 g (has no administration in time range)  ceFEPIme (MAXIPIME) 2 g in sodium chloride 0.9 % 100 mL IVPB (0 g Intravenous Stopped 03/08/24 1831)  metroNIDAZOLE (FLAGYL) IVPB 500 mg (0 mg Intravenous Stopped 03/08/24 1831)     IMPRESSION / MDM / ASSESSMENT AND PLAN / ED COURSE  I reviewed the triage vital signs and the nursing notes.                              73 y.o. female with past medical history of hypertension, hyperlipidemia, diabetes, alcohol abuse, and metastatic pancreatic cancer who presents to the ED complaining of generalized weakness with fevers over the past few days.  Patient's presentation is most consistent with acute presentation with potential threat to life or bodily function.  Differential diagnosis includes, but is not limited to, sepsis, pneumonia, UTI, viral illness, intra-abdominal infection, anemia, electrolyte abnormality, AKI.  Patient chronically ill-appearing but nontoxic and in no acute distress, vital signs initially remarkable for hypothermia, now improved after patient was placed under multiple warm blankets.  She was referred to the ED by oncology due to concern for infection and sepsis, blood cultures drawn yesterday along with chest x-ray and urinalysis that were unremarkable.  We will repeat blood cultures, lactic acid within normal limits.  I did review labs from earlier today that showed significant hyponatremia and leukocytosis.  We will start on broad-spectrum antibiotics, patient appears clinically fluid overloaded and we will hold off on IV fluids for now.  Procalcitonin uptrending from yesterday but no clear source of infection noted at  this time as chest x-ray is unremarkable and urinalysis does not appear concerning for infection.  COVID and flu testing is negative, case discussed with hospitalist for admission.      FINAL CLINICAL IMPRESSION(S) / ED DIAGNOSES   Final diagnoses:  Sepsis without acute organ dysfunction, due to unspecified organism [facility])  Malignant neoplasm of pancreas, unspecified location of malignancy (HCC)     Rx / DC Orders   ED Discharge Orders     None        Note:  This document was prepared using Dragon voice recognition software and may include unintentional dictation errors.   Chesley Noon, MD 03/08/24 [phone]

## 2024-03-08 NOTE — Progress Notes (Signed)
 Patient had fever last night that was relieved with Tylenol.  Was dizzy last night but no episodes today.  Had 4 bowel movements this morning so took 2 Imodium.  Feeling swollen in extremities and bloated in abdomen, 10 lb wt gain in 3 days.

## 2024-03-08 NOTE — ED Notes (Signed)
 Patient transported to X-ray

## 2024-03-08 NOTE — Progress Notes (Signed)
 CODE SEPSIS - PHARMACY COMMUNICATION  **Broad Spectrum Antibiotics should be administered within 1 hour of Sepsis diagnosis**  Time Code Sepsis Called/Page Received: 1638  Antibiotics Ordered: cefepime, metronidazole, vancomycin  Time of 1st antibiotic administration: 1722  Additional action taken by pharmacy: N/A  If necessary, Name of Provider/Nurse Contacted: N/A    Merryl Hacker ,PharmD Clinical Pharmacist  03/08/2024  4:41 PM

## 2024-03-08 NOTE — ED Triage Notes (Addendum)
 Pt was sent by cancer center for abnormal blood work. Pt states she's had low BP, dizziness, fevers at night and is on antibiotics. Pt started chemo on Tuesday. Port was accessed today, blood cultures were drawn per pt. Pt denies N/V/D, SHOB, dizziness, or pain at this time.

## 2024-03-08 NOTE — Sepsis Progress Note (Signed)
 Elink will follow per sepsis protocol.

## 2024-03-08 NOTE — Progress Notes (Signed)
 Pharmacy Antibiotic Note  Heather Burke is a 73 y.o. female w/ PMH of HTN, HLD, DM, EtOH abuse, pancreatic cancer admitted on 03/08/2024 with SIRS vs. sepsis with unlcear source.  Pharmacy has been consulted for vancomycin and Zosyn dosing.  Plan:  1) start Zosyn 3.375g IV q8h (4 hour infusion).  2) start vancomycin 1500 mg IV x 1 (previously received 1000 mg in  the ED) then 1250 mg IV every 24 hours Goal AUC 400-550. Expected AUC: 540.3 SCr used: 0.80 mg/dL (rounded up)     Temp (24hrs), Avg:95.4 F (35.2 C), Min:94.2 F (34.6 C), Max:97.4 F (36.3 C)  Recent Labs  Lab 03/05/24 0807 03/07/24 1501 03/08/24 1321 03/08/24 1457  WBC 20.3* 23.7* 23.7*  --   CREATININE 0.67 0.75 0.71  --   LATICACIDVEN  --   --   --  1.6    Estimated Creatinine Clearance: 59 mL/min (by C-G formula based on SCr of 0.71 mg/dL).    Allergies  Allergen Reactions   Metformin And Related Diarrhea    SEVERE DIARRHEA   Amlodipine Nausea Only and Other (See Comments)    Stomach cramping   Atorvastatin Nausea And Vomiting   Lisinopril Cough   Pravastatin Nausea And Vomiting   Icosapent Ethyl (Epa Ethyl Ester) (Fish) Palpitations   Nifedipine Er Other (See Comments)    headache    Antimicrobials this admission: 03/21 cefepime, metronidazole x 1 03/21 vancomycin >>  03/21 Zosyn >>   Microbiology results: 03/20 BCx: pending 03/20 UCx: NG final   Thank you for allowing pharmacy to be a part of this patient's care.  Heather Burke 03/08/2024 7:45 PM

## 2024-03-08 NOTE — H&P (Addendum)
 History and Physical    DECEMBER HEDTKE GNF:[id number] DOB: 03/06/1951 DOA: 03/08/2024  Referring MD/NP/PA:   PCP: Sallee Provencal, FNP   Patient coming from:  The patient is coming from home.     Chief Complaint: Intermittent fever  HPI: Heather Burke is a 73 y.o. female with medical history significant of recently diagnosed invasive stage IV pancreatic cancer on chemotherapy, breast cancer 2016, HTN, HLD, DM, dCHF, hypothyroidism, depression, who presents with intermittent fever.  Patient states that she has intermittent fever for almost 3 weeks.  Last night she had fever 102.0 with chills.  No chest pain, cough, SOB.  Patient does not have nausea vomiting or abdominal pain.  Patient has intermittent diarrhea and is taking Imodium as needed.  No symptoms of UTI.  Patient has been taking Levaquin prescribed by Dr. Phylliss Bob of oncology since 3/18 without improvement.  She had first dose of chemotherapy on 3/18 per patient.  She has generalized weakness, poor appetite and decreased oral intake. Pt was seen in cancer center and found to have hypothermia, and had blood culture drawn,  and was sent to ED for further evaluation and treatment.  Patient states that she stopped drinking alcohol since February.   Data reviewed independently and ED Course: pt was found to have WBC 23.7, sodium 120, GFR > 60, potassium 4.4, magnesium 1.8, phosphorus 3.0, urinalysis (clear appearance, trace amount of leukocyte, negative bacteria, WBC 6-10), negative PCR for COVID, flu and RSV, lactic acid 1.6, procalcitonin 3.50.  Temperature 100.2 --> 94.2, initial blood pressure 71/30 which improved to 119/53, heart rate 56, 20, oxygen saturation 96-99% on room air.  Chest x-ray negative for infiltration.  Patient is admitted to telemetry bed as inpatient.   EKG: I have personally reviewed.  Sinus rhythm, QTc 423, low voltage.   Review of Systems:   General: has fevers, chills, no body weight gain, has poor  appetite, has fatigue HEENT: no blurry vision, hearing changes or sore throat Respiratory: no dyspnea, coughing, wheezing CV: no chest pain, no palpitations GI: no nausea, vomiting, abdominal pain, has diarrhea, no constipation GU: no dysuria, burning on urination, increased urinary frequency, hematuria  Ext: no leg edema Neuro: no unilateral weakness, numbness, or tingling, no vision change or hearing loss Skin: no rash, no skin tear. MSK: No muscle spasm, no deformity, no limitation of range of movement in spin Heme: No easy bruising.  Travel history: No recent long distant travel.   Allergy:  Allergies  Allergen Reactions   Metformin And Related Diarrhea    SEVERE DIARRHEA   Amlodipine Nausea Only and Other (See Comments)    Stomach cramping   Atorvastatin Nausea And Vomiting   Lisinopril Cough   Pravastatin Nausea And Vomiting   Icosapent Ethyl (Epa Ethyl Ester) (Fish) Palpitations   Nifedipine Er Other (See Comments)    headache    Past Medical History:  Diagnosis Date   Breast cancer (HCC) 06/08/2015   T1c, N0;  ER+,PR+, Her 2 neg (FISH neg) x2., SLN x 3 negative. Mammoprint: Low risk. Multifocal.Invasive mammary and lobular carcinoma.    Diabetes mellitus without complication (HCC)    GERD (gastroesophageal reflux disease)    Heart murmur 2016   newly diagnosed, slight murmur   Hyperlipidemia    Hypertension    Hypothyroidism    Lupus    PONV (postoperative nausea and vomiting)    with Hysterectomy   Port-A-Cath in place    Thyroid disease     Past  Surgical History:  Procedure Laterality Date   ABDOMINAL HYSTERECTOMY     BREAST BIOPSY Right 2002   negative/ Dr. Lemar Livings   BREAST BIOPSY Left 06-08-15   INVASIVE MAMMARY CARCINOMA WITH PARTIAL SOLID PATTERN.    BREAST BIOPSY Right 06/26/2018   FIBROADENOMATOUS CHANGES WITH STROMAL HYALINIZATION AND CALCIFICATIONS   COLONOSCOPY WITH PROPOFOL N/A 09/13/2016   Procedure: COLONOSCOPY WITH PROPOFOL;  Surgeon:  Midge Minium, MD;  Location: [facility];  Service: Endoscopy;  Laterality: N/A;   core biopsy Left 06/08/15   GUM SURGERY  2006   IR IMAGING GUIDED PORT INSERTION  02/28/2024   MASTECTOMY Left 2016   OOPHORECTOMY     SENTINEL NODE BIOPSY Left 08/18/2015   Procedure: SENTINEL NODE BIOPSY;  Surgeon: Earline Mayotte, MD;  Location: [facility] ORS;  Service: General;  Laterality: Left;   SIMPLE MASTECTOMY WITH AXILLARY SENTINEL NODE BIOPSY Left 08/18/2015   Procedure: SIMPLE MASTECTOMY;  Surgeon: Earline Mayotte, MD;  Location: [facility] ORS;  Service: General;  Laterality: Left;   TONSILLECTOMY      Social History:  reports that she has been smoking cigarettes. She started smoking about 7 weeks ago. She has a 98.1 pack-year smoking history. She has never used smokeless tobacco. She reports that she does not currently use alcohol after a past usage of about 14.0 - 28.0 standard drinks of alcohol per week. She reports that she does not use drugs.  Family History:  Family History  Problem Relation Age of Onset   Cancer Father        prostate   Thyroid disease Daughter    Heart attack Sister    Breast cancer Cousin    Breast cancer Cousin    Breast cancer Other      Prior to Admission medications   Medication Sig Start Date End Date Taking? Authorizing Provider  ACCU-CHEK GUIDE TEST test strip USE 1 STIP TO CHECK BLOOD SUGAR IN THE MORNING, AT NOON, AND AT BEDTIME 11/07/23   Jacky Kindle, FNP  Accu-Chek Softclix Lancets lancets 1 EACH BY DOES NOT APPLY ROUTE IN THE MORNING, AT NOON, AND AT BEDTIME 11/22/23   Jacky Kindle, FNP  aspirin 81 MG tablet Take 81 mg by mouth daily. Patient not taking: Reported on 03/08/2024    [provider]  Blood Glucose Monitoring Suppl DEVI 1 each by Does not apply route in the morning, at noon, and at bedtime. May substitute to any manufacturer covered by patient's insurance. 08/10/23   Jacky Kindle, FNP  Cholecalciferol 125 MCG (5000 UT) TABS Take 1  tablet by mouth daily.    [provider]  Continuous Glucose Sensor (FREESTYLE LIBRE 3 PLUS SENSOR) MISC Place 1 sensor on the skin every 15 days. Use to check glucose continuously 11/29/23   [name] T, FNP  dexamethasone (DECADRON) 4 MG tablet Take 2 tablets (8 mg total) by mouth daily. Take 2 tablets daily x 3 days starting the day after chemotherapy. Take with food. 02/23/24   Creig Hines, MD  diltiazem (DILT-XR) 240 MG 24 hr capsule Take 1 capsule (240 mg total) by mouth daily. 02/09/24   Sallee Provencal, FNP  fenofibrate (TRICOR) 145 MG tablet Take 1 tablet (145 mg total) by mouth daily. 08/10/23   Jacky Kindle, FNP  fluticasone (FLONASE) 50 MCG/ACT nasal spray SPRAY 2 SPRAYS INTO EACH NOSTRIL EVERY DAY 11/22/23   [name] T, FNP  insulin glargine (LANTUS) 100 UNIT/ML Solostar Pen Inject 40 Units  into the skin at bedtime. Titrate by 2 units every 3 days for fasting morning blood sugar >150. Patient taking differently: Inject 18 Units into the skin at bedtime. Titrate by 2 units every 7 days for fasting morning blood sugar >150. 11/09/23   [name] T, FNP  Insulin Pen Needle (PEN NEEDLES 31GX5/16") 31G X 8 MM MISC 1 each by Does not apply route daily. 08/10/23   Jacky Kindle, FNP  latanoprost (XALATAN) 0.005 % ophthalmic solution 1 drop at bedtime. Patient not taking: Reported on 03/08/2024 07/05/22   [provider]  levofloxacin (LEVAQUIN) 250 MG tablet Take 2 tablets (500 mg total) by mouth daily for 7 days. 03/05/24 03/12/24  Creig Hines, MD  levothyroxine (SYNTHROID) 88 MCG tablet Take 1 tablet (88 mcg total) by mouth daily. 02/09/24   Sallee Provencal, FNP  lidocaine-prilocaine (EMLA) cream Apply to affected area once 02/23/24   Creig Hines, MD  loperamide (IMODIUM) 2 MG capsule Take 2 tabs by mouth with first loose stool, then 1 tab with each additional loose stool as needed. Do not exceed 8 tabs in a 24-hour period 02/23/24   Creig Hines, MD  metoprolol  (TOPROL-XL) 200 MG 24 hr tablet Take 1 tablet (200 mg total) by mouth daily. 02/09/24   Sallee Provencal, FNP  mirtazapine (REMERON) 15 MG tablet Take 1 tablet (15 mg total) by mouth at bedtime. Patient not taking: Reported on 03/08/2024 02/23/24   Creig Hines, MD  ondansetron (ZOFRAN) 8 MG tablet Take 1 tablet (8 mg total) by mouth every 8 (eight) hours as needed for nausea or vomiting. Start on the third day after chemotherapy 02/23/24   Creig Hines, MD  oxyCODONE (OXY IR/ROXICODONE) 5 MG immediate release tablet Take 1 tablet (5 mg total) by mouth every 4 (four) hours as needed for severe pain (pain score 7-10). 02/23/24   Creig Hines, MD  potassium chloride (KLOR-CON M10) 10 MEQ tablet Take 1 tablet (10 mEq total) by mouth daily. 02/09/24   Sallee Provencal, FNP  prochlorperazine (COMPAZINE) 10 MG tablet Take 1 tablet (10 mg total) by mouth every 6 (six) hours as needed for nausea or vomiting (Nausea or vomiting). 02/23/24   Creig Hines, MD  valsartan-hydrochlorothiazide (DIOVAN-HCT) 320-12.5 MG tablet Take 1 tablet by mouth daily. 08/09/23   Jacky Kindle, FNP  VYZULTA 0.024 % SOLN Apply to eye. 02/20/24   [provider]    Physical Exam: Vitals:   03/08/24 1630 03/08/24 1730 03/08/24 2253 03/08/24 2355  BP: 119/60 (!) 119/53 (!) 118/56 (!) 111/59  Pulse:  (!) 58 62 (!) 59  Resp:  19 18 18   Temp:   98 F (36.7 C) 97.7 F (36.5 C)  TempSrc:   Oral   SpO2: 96% 96% 97% 97%  Weight:   49.1 kg   Height:   5\' 3"  (1.6 m)    General: Not in acute distress.   HEENT:       Eyes: PERRL, EOMI, no jaundice       ENT: No discharge from the ears and nose, no pharynx injection, no tonsillar enlargement.        Neck: No JVD, no bruit, no mass felt. Heme: No neck lymph node enlargement. Cardiac: S1/S2, RRR, No murmurs, No gallops or rubs. Respiratory: No rales, wheezing, rhonchi or rubs. GI: Soft, nondistended, nontender, no rebound pain, no organomegaly, BS present. GU: No  hematuria Ext: No pitting leg edema bilaterally.  1+DP/PT pulse bilaterally. Musculoskeletal: No joint deformities, No joint redness or warmth, no limitation of ROM in spin. Skin: No rashes.  Neuro: Alert, oriented X3, cranial nerves II-XII grossly intact, moves all extremities normally. Psych: Patient is not psychotic, no suicidal or hemocidal ideation.  Labs on Admission: I have personally reviewed following labs and imaging studies  CBC: Recent Labs  Lab 03/05/24 0807 03/07/24 1501 03/08/24 1321  WBC 20.3* 23.7* 23.7*  NEUTROABS 16.6* 21.6* 21.8*  HGB 11.2* 11.0* 10.7*  HCT 32.5* 32.9* 30.7*  MCV 85.8 87.0 84.3  PLT 362 222 155   Basic Metabolic Panel: Recent Labs  Lab 03/05/24 0807 03/07/24 1501 03/08/24 1321 03/08/24 1714  NA 123* 121* 120*  --   K 3.6 4.3 4.4  --   CL 91* 92* 91*  --   CO2 22 20* 20*  --   GLUCOSE 161* 121* 146*  --   BUN 17 29* 35*  --   CREATININE 0.67 0.75 0.71  --   CALCIUM 7.8* 7.2* 7.5*  --   MG  --   --   --  1.8  PHOS  --   --   --  3.0   GFR: Estimated Creatinine Clearance: 50 mL/min (by C-G formula based on SCr of 0.71 mg/dL). Liver Function Tests: Recent Labs  Lab 03/05/24 0807 03/07/24 1501 03/08/24 1321  AST 63* 58* 45*  ALT 21 21 19   ALKPHOS 153* 118 102  BILITOT 1.2 1.3* 1.6*  PROT 6.6 5.7* 5.5*  ALBUMIN 1.9* 1.7* 1.6*   No results for input(s): "LIPASE", "AMYLASE" in the last 168 hours. No results for input(s): "AMMONIA" in the last 168 hours. Coagulation Profile: No results for input(s): "INR", "PROTIME" in the last 168 hours. Cardiac Enzymes: No results for input(s): "CKTOTAL", "CKMB", "CKMBINDEX", "TROPONINI" in the last 168 hours. BNP (last 3 results) No results for input(s): "PROBNP" in the last 8760 hours. HbA1C: No results for input(s): "HGBA1C" in the last 72 hours. CBG: Recent Labs  Lab 03/08/24 2150  GLUCAP 127*   Lipid Profile: No results for input(s): "CHOL", "HDL", "LDLCALC", "TRIG", "CHOLHDL",  "LDLDIRECT" in the last 72 hours. Thyroid Function Tests: No results for input(s): "TSH", "T4TOTAL", "FREET4", "T3FREE", "THYROIDAB" in the last 72 hours. Anemia Panel: No results for input(s): "VITAMINB12", "FOLATE", "FERRITIN", "TIBC", "IRON", "RETICCTPCT" in the last 72 hours. Urine analysis:    Component Value Date/Time   COLORURINE YELLOW (A) 03/08/2024 1825   APPEARANCEUR CLEAR (A) 03/08/2024 1825   LABSPEC 1.016 03/08/2024 1825   PHURINE 5.0 03/08/2024 1825   GLUCOSEU NEGATIVE 03/08/2024 1825   HGBUR NEGATIVE 03/08/2024 1825   BILIRUBINUR NEGATIVE 03/08/2024 1825   KETONESUR NEGATIVE 03/08/2024 1825   PROTEINUR NEGATIVE 03/08/2024 1825   NITRITE NEGATIVE 03/08/2024 1825   LEUKOCYTESUR TRACE (A) 03/08/2024 1825   Sepsis Labs: @LABRCNTIP (procalcitonin:4,lacticidven:4) ) Recent Results (from the past 240 hours)  Urine culture     Status: None   Collection Time: 03/07/24  3:00 PM   Specimen: Urine, Random  Result Value Ref Range Status   Specimen Description   Final    URINE, RANDOM Performed at [facility], [address] [place], [place] [address]    Special Requests   Final    NONE Performed at [facility], [address], [place], [place] [address]    Culture   Final    NO GROWTH Performed at [facility], [address] [place], [place] [address]    Report  Status 03/08/2024 FINAL  Final  Resp panel by RT-PCR (RSV, Flu A&B, Covid) Anterior Nasal Swab     Status: None   Collection Time: 03/08/24  5:14 PM   Specimen: Anterior Nasal Swab  Result Value Ref Range Status   SARS Coronavirus 2 by RT PCR NEGATIVE NEGATIVE Final    Comment: (NOTE) SARS-CoV-2 target nucleic acids are NOT DETECTED.  The SARS-CoV-2 RNA is generally detectable in upper respiratory specimens during the acute phase of infection. The lowest concentration of SARS-CoV-2 viral copies this assay can detect is 138 copies/mL. A negative result does not preclude  SARS-Cov-2 infection and should not be used as the sole basis for treatment or other patient management decisions. A negative result may occur with  improper specimen collection/handling, submission of specimen other than nasopharyngeal swab, presence of viral mutation(s) within the areas targeted by this assay, and inadequate number of viral copies(<138 copies/mL). A negative result must be combined with clinical observations, patient history, and epidemiological information. The expected result is Negative.  Fact Sheet for Patients:  BloggerCourse.com  Fact Sheet for Healthcare Providers:  SeriousBroker.it  This test is no t yet approved or cleared by the [place] FDA and  has been authorized for detection and/or diagnosis of SARS-CoV-2 by FDA under an Emergency Use Authorization (EUA). This EUA will remain  in effect (meaning this test can be used) for the duration of the COVID-19 declaration under Section 564(b)(1) of the Act, 21 U.S.C.section 360bbb-3(b)(1), unless the authorization is terminated  or revoked sooner.       Influenza A by PCR NEGATIVE NEGATIVE Final   Influenza B by PCR NEGATIVE NEGATIVE Final    Comment: (NOTE) The Xpert Xpress SARS-CoV-2/FLU/RSV plus assay is intended as an aid in the diagnosis of influenza from Nasopharyngeal swab specimens and should not be used as a sole basis for treatment. Nasal washings and aspirates are unacceptable for Xpert Xpress SARS-CoV-2/FLU/RSV testing.  Fact Sheet for Patients: BloggerCourse.com  Fact Sheet for Healthcare Providers: SeriousBroker.it  This test is not yet approved or cleared by the [place] FDA and has been authorized for detection and/or diagnosis of SARS-CoV-2 by FDA under an Emergency Use Authorization (EUA). This EUA will remain in effect (meaning this test can be used) for the duration of  the COVID-19 declaration under Section 564(b)(1) of the Act, 21 U.S.C. section 360bbb-3(b)(1), unless the authorization is terminated or revoked.     Resp Syncytial Virus by PCR NEGATIVE NEGATIVE Final    Comment: (NOTE) Fact Sheet for Patients: BloggerCourse.com  Fact Sheet for Healthcare Providers: SeriousBroker.it  This test is not yet approved or cleared by the [place] FDA and has been authorized for detection and/or diagnosis of SARS-CoV-2 by FDA under an Emergency Use Authorization (EUA). This EUA will remain in effect (meaning this test can be used) for the duration of the COVID-19 declaration under Section 564(b)(1) of the Act, 21 U.S.C. section 360bbb-3(b)(1), unless the authorization is terminated or revoked.  Performed at [facility], [address] [place], [place] [address]      Radiological Exams on Admission:   Assessment/Plan Principal Problem:   SIRS (systemic inflammatory response syndrome) (HCC) Active Problems:   Malignant neoplasm of other parts of pancreas (HCC)   Hyponatremia   Hypertension associated with diabetes (HCC)   Hypothyroidism   Hyperlipemia   Chronic diastolic CHF (congestive heart failure) (HCC)   Diarrhea   Alcohol dependence, daily use (HCC)   Diabetes mellitus without complication (HCC)  Depression   Protein-calorie malnutrition, moderate (HCC)   Assessment and Plan:  SIRS (systemic inflammatory response syndrome) [facility]): Patient meets criteria for SIRS with WBC 23.7 and hypothermia with temperature 94.2.  Initially hypotensive, which improved to 119/53 before giving IVF.  No clear source of infection identified so far, will treat patient as SIRS.  If source of infection is identified, will change to sepsis.  Lactic acid is normal.  Procalcitonin 3.50.  Chest x-ray negative for infiltration.  UA showed trace amount of leukocyte with WBC 6-10, but patient denies  symptoms of UTI.  -will admit to telemetry bed as inpatient -Antibiotics: Vancomycin plus Zosyn (patient received 1 dose of cefepime and Flagyl in ED) -Blood culture -IV fluid: 500 cc normal saline, Naima 75 cc/h -give 100 mg of solucortef as stress dose  Malignant neoplasm of other parts of pancreas [facility]): Patient had 1 dose of chemotherapy -Follow-up with Dr. Wandalee Ferdinand oncology  Hyponatremia: Sodium 120, likely multifactorial etiology, including poor oral intake, continuation of Diovan/hydrochlorothiazide and  dehydration.  Mental status normal. - Will check urine sodium, urine osmolality, serum osmolality. - Fluid restriction - IVF: 500 mL NS in ED, will continue with IV normal saline at 75 mL/h - Sodium chloride tablet 1 g twice daily - f/u by BMP q8h - avoid over correction too fast due to risk of central pontine myelinolysis -Hold Diovan-HCTZ  Hypertension associated with diabetes (HCC) -Hold blood pressure medications (diltiazem, metoprolol, Diovan-HCTZ) due to softer blood pressure  Hypothyroidism -Synthroid  Hyperlipemia -Tricor  Chronic diastolic CHF (congestive heart failure) (HCC): 2D echo on 10/09/2015 showed EF of 55 to 60% with grade 1 diastolic dysfunction.  Patient has 1+ leg edema, no shortness of breath.  BNP is 117, does not seem to have CHF exacerbation.  Will hold off diuretics due to SIRS. -Watch volume status closely  Diarrhea -Check C. Difficile  Alcohol dependence, daily use (HCC) -Patient stopped drinking alcohol since February  Diabetes mellitus without complication [facility]): Recent A1c 6.9, well-controlled.  Patient is taking Lantus 16 units daily -Sliding scale insulin  Depression -Continue home medications  Protein-calorie malnutrition, moderate (HCC): Body weight 49.1 kg, BMI 19.17 -Ensure -Nutrition consult       DVT ppx: SQ Lovenox  Code Status: Full code   Family Communication: Yes, patient's 2 daughters   at bed side.     Disposition Plan:  Anticipate discharge back to previous environment  Consults called: None  Admission status and Level of care: Telemetry Medical:    as inpt        Dispo: The patient is from: Home              Anticipated d/c is to: Home              Anticipated d/c date is: 2 days              Patient currently is not medically stable to d/c.    Severity of Illness:  The appropriate patient status for this patient is INPATIENT. Inpatient status is judged to be reasonable and necessary in order to provide the required intensity of service to ensure the patient's safety. The patient's presenting symptoms, physical exam findings, and initial radiographic and laboratory data in the context of their chronic comorbidities is felt to place them at high risk for further clinical deterioration. Furthermore, it is not anticipated that the patient will be medically stable for discharge from the hospital within 2 midnights of admission.   * I  certify that at the point of admission it is my clinical judgment that the patient will require inpatient hospital care spanning beyond 2 midnights from the point of admission due to high intensity of service, high risk for further deterioration and high frequency of surveillance required.*       Date of Service 03/09/2024    Lorretta Harp [facility]   If 7PM-7AM, please contact night-coverage [email or web address] 03/09/2024, 12:51 AM

## 2024-03-08 NOTE — Progress Notes (Signed)
 Symptom Management Clinic [facility] at [facility] Telephone:([phone] [phone]) [phone]  Patient Care Team: Sallee Provencal, FNP as PCP - General (Family Medicine) Irene Limbo., MD as Consulting Physician (Ophthalmology) Ronney Asters, Jackelyn Poling, RPH-CPP as Pharmacist Benita Gutter, RN as Oncology Nurse Navigator Creig Hines, MD as Consulting Physician (Oncology)   NAME OF PATIENT: Heather Burke  [id number]  03/24/1951   DATE OF VISIT: 03/08/24  REASON FOR CONSULT: Heather Burke is a 73 y.o. female with multiple medical problems including stage IV pancreatic cancer with liver metastases.  INTERVAL HISTORY: Patient saw Dr. Smith Robert on 03/05/2024 at which time she received day 1, cycle 1 modified FOLFIRINOX chemotherapy.  Patient was noted to have leukocytosis and subjective fevers.  Was started on Levaquin x 7 days.  Patient seen yesterday in infusion for hypotension.  This improved after IV fluids.  Patient was also febrile but refused ED visit.  Today, patient returns for repeat labs/possible fluids.  She reports continued fevers overnight with Tmax of 102.0.  Denies shortness of breath or chest pain.  No cough or congestion.  Occasional nausea but no vomiting.  Loose stools.  No urinary symptoms.  Reports minimal oral intake.  Denies any neurologic complaints. Denies any easy bleeding or bruising.  Patient offers no further specific complaints today.   PAST MEDICAL HISTORY: Past Medical History:  Diagnosis Date   Breast cancer (HCC) 06/08/2015   T1c, N0;  ER+,PR+, Her 2 neg (FISH neg) x2., SLN x 3 negative. Mammoprint: Low risk. Multifocal.Invasive mammary and lobular carcinoma.    Diabetes mellitus without complication (HCC)    GERD (gastroesophageal reflux disease)    Heart murmur 2016   newly diagnosed, slight murmur   Hyperlipidemia    Hypertension    Hypothyroidism    Lupus    PONV (postoperative nausea and vomiting)    with  Hysterectomy   Port-A-Cath in place    Thyroid disease     PAST SURGICAL HISTORY:  Past Surgical History:  Procedure Laterality Date   ABDOMINAL HYSTERECTOMY     BREAST BIOPSY Right 2002   negative/ Dr. Lemar Livings   BREAST BIOPSY Left 06-08-15   INVASIVE MAMMARY CARCINOMA WITH PARTIAL SOLID PATTERN.    BREAST BIOPSY Right 06/26/2018   FIBROADENOMATOUS CHANGES WITH STROMAL HYALINIZATION AND CALCIFICATIONS   COLONOSCOPY WITH PROPOFOL N/A 09/13/2016   Procedure: COLONOSCOPY WITH PROPOFOL;  Surgeon: Midge Minium, MD;  Location: [facility] ENDOSCOPY;  Service: Endoscopy;  Laterality: N/A;   core biopsy Left 06/08/15   GUM SURGERY  2006   IR IMAGING GUIDED PORT INSERTION  02/28/2024   MASTECTOMY Left 2016   OOPHORECTOMY     SENTINEL NODE BIOPSY Left 08/18/2015   Procedure: SENTINEL NODE BIOPSY;  Surgeon: Earline Mayotte, MD;  Location: [facility] ORS;  Service: General;  Laterality: Left;   SIMPLE MASTECTOMY WITH AXILLARY SENTINEL NODE BIOPSY Left 08/18/2015   Procedure: SIMPLE MASTECTOMY;  Surgeon: Earline Mayotte, MD;  Location: [facility] ORS;  Service: General;  Laterality: Left;   TONSILLECTOMY      HEMATOLOGY/ONCOLOGY HISTORY:  Oncology History  Malignant neoplasm of other parts of pancreas (HCC)  02/23/2024 Initial Diagnosis   Malignant neoplasm of other parts of pancreas (HCC)   02/23/2024 Cancer Staging   Staging form: Exocrine Pancreas, AJCC 8th Edition - Clinical stage from 02/23/2024: Stage IV (cT2, cN2, pM1) - Signed by Creig Hines, MD on 02/23/2024   03/05/2024 -  Chemotherapy   Patient is on  Treatment Plan : PANCREAS Modified FOLFIRINOX q14d x 4 cycles       ALLERGIES:  is allergic to metformin and related, amlodipine, atorvastatin, lisinopril, pravastatin, icosapent ethyl (epa ethyl ester) (fish), and nifedipine er.  MEDICATIONS:  Current Outpatient Medications  Medication Sig Dispense Refill   ACCU-CHEK GUIDE TEST test strip USE 1 STIP TO CHECK BLOOD SUGAR IN THE MORNING, AT NOON, AND  AT BEDTIME 100 strip 0   Accu-Chek Softclix Lancets lancets 1 EACH BY DOES NOT APPLY ROUTE IN THE MORNING, AT NOON, AND AT BEDTIME 100 each 0   aspirin 81 MG tablet Take 81 mg by mouth daily. (Patient not taking: Reported on 02/23/2024)     Blood Glucose Monitoring Suppl DEVI 1 each by Does not apply route in the morning, at noon, and at bedtime. May substitute to any manufacturer covered by patient's insurance. 1 each 0   Cholecalciferol 125 MCG (5000 UT) TABS Take 1 tablet by mouth daily.     Continuous Glucose Sensor (FREESTYLE LIBRE 3 PLUS SENSOR) MISC Place 1 sensor on the skin every 15 days. Use to check glucose continuously 2 each 12   dexamethasone (DECADRON) 4 MG tablet Take 2 tablets (8 mg total) by mouth daily. Take 2 tablets daily x 3 days starting the day after chemotherapy. Take with food. 30 tablet 1   diltiazem (DILT-XR) 240 MG 24 hr capsule Take 1 capsule (240 mg total) by mouth daily. 90 capsule 1   fenofibrate (TRICOR) 145 MG tablet Take 1 tablet (145 mg total) by mouth daily. 90 tablet 3   fluticasone (FLONASE) 50 MCG/ACT nasal spray SPRAY 2 SPRAYS INTO EACH NOSTRIL EVERY DAY 48 mL 2   icosapent Ethyl (VASCEPA) 1 g capsule Take 2 capsules (2 g total) by mouth 2 (two) times daily. (Patient not taking: Reported on 02/09/2024) 120 capsule 2   insulin glargine (LANTUS) 100 UNIT/ML Solostar Pen Inject 40 Units into the skin at bedtime. Titrate by 2 units every 3 days for fasting morning blood sugar >150. (Patient taking differently: Inject 18 Units into the skin at bedtime. Titrate by 2 units every 7 days for fasting morning blood sugar >150.) 15 mL 11   Insulin Pen Needle (PEN NEEDLES 31GX5/16") 31G X 8 MM MISC 1 each by Does not apply route daily. 100 each 11   latanoprost (XALATAN) 0.005 % ophthalmic solution 1 drop at bedtime. (Patient not taking: Reported on 02/23/2024)     levofloxacin (LEVAQUIN) 250 MG tablet Take 2 tablets (500 mg total) by mouth daily for 7 days. 14 tablet 0    levothyroxine (SYNTHROID) 88 MCG tablet Take 1 tablet (88 mcg total) by mouth daily. 90 tablet 1   lidocaine-prilocaine (EMLA) cream Apply to affected area once 30 g 3   loperamide (IMODIUM) 2 MG capsule Take 2 tabs by mouth with first loose stool, then 1 tab with each additional loose stool as needed. Do not exceed 8 tabs in a 24-hour period 60 capsule 3   metoprolol (TOPROL-XL) 200 MG 24 hr tablet Take 1 tablet (200 mg total) by mouth daily. 90 tablet 1   mirtazapine (REMERON) 15 MG tablet Take 1 tablet (15 mg total) by mouth at bedtime. 60 tablet 0   Misc Natural Products (T-RELIEF CBD+13 SL) Place 500 mg under the tongue daily. (Patient not taking: Reported on 02/20/2024)     Nutritional Supplements (QUINOA/KALE/HEMP PO) Place 750 mg under the tongue. (Patient not taking: Reported on 02/20/2024)     ondansetron [facility])  8 MG tablet Take 1 tablet (8 mg total) by mouth every 8 (eight) hours as needed for nausea or vomiting. Start on the third day after chemotherapy 30 tablet 1   oxyCODONE (OXY IR/ROXICODONE) 5 MG immediate release tablet Take 1 tablet (5 mg total) by mouth every 4 (four) hours as needed for severe pain (pain score 7-10). 60 tablet 0   potassium chloride (KLOR-CON M10) 10 MEQ tablet Take 1 tablet (10 mEq total) by mouth daily. 90 tablet 1   prochlorperazine (COMPAZINE) 10 MG tablet Take 1 tablet (10 mg total) by mouth every 6 (six) hours as needed for nausea or vomiting (Nausea or vomiting). 30 tablet 1   valsartan-hydrochlorothiazide (DIOVAN-HCT) 320-12.5 MG tablet Take 1 tablet by mouth daily. 90 tablet 3   VYZULTA 0.024 % SOLN Apply to eye.     No current facility-administered medications for this visit.   Facility-Administered Medications Ordered in Other Visits  Medication Dose Route Frequency Provider Last Rate Last Admin   heparin lock flush 100 unit/mL  500 Units Intravenous Once Cletis Clack, Daryl Eastern, NP        VITAL SIGNS: There were no vitals taken for this visit. There  were no vitals filed for this visit.  Estimated body mass index is 24.23 kg/m as calculated from the following:   Height as of 03/05/24: 5\' 3"  (1.6 m).   Weight as of 03/05/24: 136 lb 12.8 oz (62.1 kg).  LABS: CBC:    Component Value Date/Time   WBC 23.7 (H) 03/07/2024 1501   WBC 11.8 (H) 02/20/2024 0758   HGB 11.0 (L) 03/07/2024 1501   HGB 12.3 02/09/2024 1300   HCT 32.9 (L) 03/07/2024 1501   HCT 37.6 02/09/2024 1300   PLT 222 03/07/2024 1501   PLT 445 02/09/2024 1300   MCV 87.0 03/07/2024 1501   MCV 92 02/09/2024 1300   NEUTROABS 21.6 (H) 03/07/2024 1501   NEUTROABS 6.7 02/09/2024 1300   LYMPHSABS 0.7 03/07/2024 1501   LYMPHSABS 1.1 02/09/2024 1300   MONOABS 0.3 03/07/2024 1501   EOSABS 0.5 03/07/2024 1501   EOSABS 0.1 02/09/2024 1300   BASOSABS 0.1 03/07/2024 1501   BASOSABS 0.0 02/09/2024 1300   Comprehensive Metabolic Panel:    Component Value Date/Time   NA 121 (L) 03/07/2024 1501   NA 128 (L) 08/09/2023 1032   K 4.3 03/07/2024 1501   CL 92 (L) 03/07/2024 1501   CO2 20 (L) 03/07/2024 1501   BUN 29 (H) 03/07/2024 1501   BUN 8 08/09/2023 1032   CREATININE 0.75 03/07/2024 1501   GLUCOSE 121 (H) 03/07/2024 1501   CALCIUM 7.2 (L) 03/07/2024 1501   AST 58 (H) 03/07/2024 1501   ALT 21 03/07/2024 1501   ALKPHOS 118 03/07/2024 1501   BILITOT 1.3 (H) 03/07/2024 1501   PROT 5.7 (L) 03/07/2024 1501   PROT 7.1 08/09/2023 1032   ALBUMIN 1.7 (L) 03/07/2024 1501   ALBUMIN 4.1 08/09/2023 1032    RADIOGRAPHIC STUDIES: DG Chest 2 View Result Date: 03/07/2024 CLINICAL DATA:  Cough, fever. EXAM: CHEST - 2 VIEW COMPARISON:  None Available. FINDINGS: The heart size and mediastinal contours are within normal limits. Right internal jugular Port-A-Cath is in grossly good position. Minimal bibasilar subsegmental atelectasis is noted with small pleural effusions. The visualized skeletal structures are unremarkable. IMPRESSION: Minimal bibasilar subsegmental atelectasis is noted with  small pleural effusions. Electronically Signed   By: Lupita Raider M.D.   On: 03/07/2024 16:42   IR IMAGING GUIDED PORT  INSERTION Result Date: 02/28/2024 INDICATION: 73 year old female with pancreatic cancer in need of durable venous access to allow for chemotherapy. She presents for port catheter placement. EXAM: IMPLANTED PORT A CATH PLACEMENT WITH ULTRASOUND AND FLUOROSCOPIC GUIDANCE MEDICATIONS: None. ANESTHESIA/SEDATION: Versed 1 mg IV; Fentanyl 50 mcg IV; administered by the radiology nurse Moderate Sedation Time:  10 minutes The patient's vital signs and level of consciousness were monitored during the procedure by the interventional radiology nurse under my direct supervision. FLUOROSCOPY: Radiation exposure index: 2 mGy, reference air kerma COMPLICATIONS: None immediate. PROCEDURE: The right neck and chest was prepped with chlorhexidine, and draped in the usual sterile fashion using maximum barrier technique (cap and mask, sterile gown, sterile gloves, large sterile sheet, hand hygiene and cutaneous antiseptic). Local anesthesia was attained by infiltration with 1% lidocaine with epinephrine. Ultrasound demonstrated patency of the right internal jugular vein, and this was documented with an image. Under real-time ultrasound guidance, this vein was accessed with a 21 gauge micropuncture needle and image documentation was performed. A small dermatotomy was made at the access site with an 11 scalpel. A 0.018" wire was advanced into the SVC and the access needle exchanged for a 14F micropuncture vascular sheath. The 0.018" wire was then removed and a 0.035" wire advanced into the IVC. An appropriate location for the subcutaneous reservoir was selected below the clavicle and an incision was made through the skin and underlying soft tissues. The subcutaneous tissues were then dissected using a combination of blunt and sharp surgical technique and a pocket was formed. A Bard Clear Vue single lumen power  injectable portacatheter was then tunneled through the subcutaneous tissues from the pocket to the dermatotomy and the port reservoir placed within the subcutaneous pocket. The venous access site was then serially dilated and a peel away vascular sheath placed over the wire. The wire was removed and the port catheter advanced into position under fluoroscopic guidance. The catheter tip is positioned in the superior cavoatrial junction. This was documented with a spot image. The portacatheter was then tested and found to flush and aspirate well. The port was flushed with saline followed by 100 units/mL heparinized saline. The pocket was then closed in two layers using first subdermal inverted interrupted absorbable sutures followed by a running subcuticular suture. The epidermis was then sealed with Dermabond. The dermatotomy at the venous access site was also closed with Dermabond. IMPRESSION: Successful placement of a right IJ approach Bard Clear Vue port catheter with ultrasound and fluoroscopic guidance. The catheter is ready for use. Electronically Signed   By: Malachy Moan M.D.   On: 02/28/2024 15:28   NM PET Image Initial (PI) Skull Base To Thigh Result Date: 02/23/2024 CLINICAL DATA:  Initial treatment strategy for pancreatic carcinoma. EXAM: NUCLEAR MEDICINE PET SKULL BASE TO THIGH TECHNIQUE: 7.1 mCi F-18 FDG was injected intravenously. Full-ring PET imaging was performed from the skull base to thigh after the radiotracer. CT data was obtained and used for attenuation correction and anatomic localization. Fasting blood glucose: 75 mg/dl COMPARISON:  CT on 16/09/9603 FINDINGS: Mediastinal blood-pool activity (background): SUV max = 1.9 Liver activity (reference): SUV max = N/A NECK:  No hypermetabolic lymph nodes or masses. Incidental CT findings:  None. CHEST: 5 mm left internal mammary chain lymph node is hypermetabolic, with SUV max of 4.3. 5 mm precardiac mediastinal lymph node is also  hypermetabolic, with SUV max of 4.0. No suspicious pulmonary nodules seen on CT images. Incidental CT findings:  None. ABDOMEN/PELVIS: 3.8 x 3.6  cm mass in the pancreatic head shows intense hypermetabolism, with SUV max of 15.4, consistent with primary pancreatic carcinoma. Hypermetabolic lymphadenopathy is seen in the adjacent peripancreatic retroperitoneum, gastrohepatic ligament, porta hepatis, and portacaval space, with 1 index lymph node in the gastrohepatic ligament measuring 2.1 cm with SUV max of 14.2. Numerous hypermetabolic metastases are seen throughout liver, largest occupying nearly the entire lateral segment of the left hepatic lobe this has SUV max of 14.2. A solid mass is seen in the anterior midpole of the right kidney measuring 4.7 x 4.4 cm. This shows mild FDG uptake compared to other disease, with SUV max of 2.9. This favors primary renal cell carcinoma over renal metastasis. No hypermetabolic masses or lymphadenopathy identified within pelvis. Incidental CT findings: Prior hysterectomy. Small amount of free fluid in pelvis. Colonic diverticulosis, without signs of diverticulitis. SKELETON: No focal hypermetabolic bone lesions to suggest skeletal metastasis. Incidental CT findings:  None. IMPRESSION: 3.8 cm hypermetabolic mass in pancreatic head, consistent with primary pancreatic carcinoma. Hypermetabolic abdominal lymphadenopathy, consistent with metastatic disease. Diffuse hypermetabolic liver metastases. 5 mm hypermetabolic left internal mammary and precardiac lymph nodes, consistent with metastatic disease. 4.7 cm solid mass in anterior midpole of right kidney, which shows mild FDG uptake. This favors primary renal cell carcinoma over renal metastasis. Electronically Signed   By: Danae Orleans M.D.   On: 02/23/2024 08:35   US BIOPSY (LIVER) Result Date: 02/20/2024 INDICATION: Multiple liver lesions, mass of the head of the pancreas, right renal mass and prior history of breast carcinoma.  Clinical suspicion of metastatic pancreatic carcinoma based on recent imaging. Presenting for liver lesion biopsy. EXAM: ULTRASOUND GUIDED CORE BIOPSY OF LIVER MASS MEDICATIONS: None. ANESTHESIA/SEDATION: Moderate (conscious) sedation was employed during this procedure. A total of Versed 1.0 mg and Fentanyl 50 mcg was administered intravenously. Moderate Sedation Time: 13 minutes. The patient's level of consciousness and vital signs were monitored continuously by radiology nursing throughout the procedure under my direct supervision. PROCEDURE: The procedure, risks, benefits, and alternatives were explained to the patient. Questions regarding the procedure were encouraged and answered. The patient understands and consents to the procedure. A time-out was performed prior to initiating the procedure. The abdominal wall was prepped with chlorhexidine in a sterile fashion, and a sterile drape was applied covering the operative field. A sterile gown and sterile gloves were used for the procedure. Local anesthesia was provided with 1% Lidocaine. Ultrasound was used to localize a lesion within the left lobe of the liver. Under ultrasound guidance, a 17 gauge trocar needle was advanced into the lesion. Three separate coaxial 18 gauge core biopsy samples were obtained and submitted in formalin. A slurry of Gel-Foam EmboCubes was then injected as the trocar needle was retracted and removed. Additional ultrasound imaging was performed. COMPLICATIONS: None immediate. FINDINGS: Confluent tumor in the left lobe of the liver was localized measuring nearly 9 cm in maximum diameter. Solid tissue was obtained. IMPRESSION: Ultrasound-guided core biopsy performed of tumor within the left lobe of the liver. Electronically Signed   By: Irish Lack M.D.   On: 02/20/2024 10:00   CT ABDOMEN PELVIS W CONTRAST Result Date: 02/10/2024 CLINICAL DATA:  Left lower quadrant abdominal pain EXAM: CT ABDOMEN AND PELVIS WITH CONTRAST TECHNIQUE:  Multidetector CT imaging of the abdomen and pelvis was performed using the standard protocol following bolus administration of intravenous contrast. RADIATION DOSE REDUCTION: This exam was performed according to the departmental dose-optimization program which includes automated exposure control, adjustment of the mA and/or kV  according to patient size and/or use of iterative reconstruction technique. CONTRAST:  75mL OMNIPAQUE IOHEXOL 350 MG/ML SOLN COMPARISON:  None Available. FINDINGS: Lower chest: No acute pleural or parenchymal lung disease. Dense calcification of the mitral valve. Hepatobiliary: There are multiple hypodense lesions within the liver, concerning for metastatic disease. The 2 largest lesions are seen within the left lobe, measuring 4.0 x 3.7 cm and 3.5 x 2.9 cm, reference images 12 and 13 of series 2. Gallbladder is decompressed, with nonspecific gallbladder wall thickening. No pericholecystic fluid or calcified gallstones. No biliary duct dilation. Pancreas: There is a 3.6 x 3.6 cm mass centered within the uncinate process of the pancreas, reference image 24/2, consistent with malignancy. There is upstream pancreatic atrophy and irregular pancreatic duct dilation. No acute inflammatory changes. Spleen: Normal in size without focal abnormality. Adrenals/Urinary Tract: The adrenals are unremarkable. There is a 5.1 x 4.6 x 5.3 cm heterogeneously enhancing mass within the anterior aspect of the right kidney, compatible with renal cell carcinoma. No evidence of extension into the renal pelvis or vascular involvement. Simple cyst upper pole left kidney does not require follow-up. Otherwise the left kidney is unremarkable. The bladder is moderately distended, without filling defect. Stomach/Bowel: No bowel obstruction or ileus. Normal appendix right lower quadrant. No bowel wall thickening or inflammatory change. Vascular/Lymphatic: 3.1 cm infrarenal abdominal aortic aneurysm. Diffuse aortic  atherosclerosis. Multiple enlarged lymph nodes are seen within the porta hepatis, consistent with nodal metastases. Largest lymph node reference image 18/2 measures 14 mm in short axis. Reproductive: Prior hysterectomy.  No adnexal masses. Other: Trace pelvic free fluid. No free intraperitoneal gas. No abdominal wall hernia. Musculoskeletal: There are no acute or destructive bony abnormalities. Reconstructed images demonstrate no additional findings. IMPRESSION: 1. 3.6 cm mass within the uncinate process of the pancreas, consistent with malignancy. Upstream pancreatic atrophy and irregular pancreatic duct dilation. Further evaluation with EUS may be useful. 2. Multiple hypodense masses within the liver, largest in the left lobe, consistent with hepatic metastases. 3. 5.3 cm heterogeneously enhancing right renal mass, consistent with renal cell carcinoma. 4. Pathologic adenopathy within the porta hepatis, consistent with nodal metastases. 5. Trace pelvic free fluid. 6. 3.1 cm infrarenal abdominal aortic aneurysm. Recommend follow-up ultrasound every 3 years. (Ref.: J Vasc Surg. 2018; 67:2-77 and J Am Coll Radiol 2013;10(10):789-794.) 7.  Aortic Atherosclerosis (ICD10-I70.0). Electronically Signed   By: Sharlet Salina M.D.   On: 02/10/2024 17:48    PERFORMANCE STATUS (ECOG) : 2 - Symptomatic, <50% confined to bed  Review of Systems Unless otherwise noted, a complete review of systems is negative.  Physical Exam General: Frail appearing Cardiovascular: regular rate and rhythm Pulmonary: clear ant fields Abdomen: soft, nontender, + bowel sounds GU: no suprapubic tenderness Extremities: no edema, no joint deformities Skin: no rashes Neurological: Weakness but otherwise nonfocal  IMPRESSION/PLAN: Stage IV pancreatic cancer -on modified FOLFIRINOX  Fevers -reported Tmax of 102.0 overnight.  Currently hypothermic - 94.6 in clinic.  Fever workup yesterday included normal procalcitonin and UA.  Chest  x-ray showed minimal atelectasis and pleural effusions but no overt pneumonia.  Blood culture still pending.  Patient continues to have leukocytosis, which could be potentially reactive from cancer.  Discussed with Dr. Smith Robert who recommended that patient present to the emergency department for further evaluation and management.  Edema -she has upper extremity/lower extremity edema.  Also has abdominal distention and I suspect ascites with approximately 14 pound weight gain over the past 2 weeks.  Likely secondary to hypoalbuminemia.  May benefit from paracentesis.  Case discussed with Dr. Smith Robert who recommended that patient transfer to the emergency department for further evaluation and management.  Patient and daughter in agreement.  Report called to ED triage.   Patient expressed understanding and was in agreement with this plan. She also understands that She can call clinic at any time with any questions, concerns, or complaints.   Thank you for allowing me to participate in the care of this very pleasant patient.   Time Total: 20 minutes  Visit consisted of counseling and education dealing with the complex and emotionally intense issues of symptom management in the setting of serious illness.Greater than 50%  of this time was spent counseling and coordinating care related to the above assessment and plan.  Signed by: Laurette Schimke, PhD, NP-C

## 2024-03-09 DIAGNOSIS — E440 Moderate protein-calorie malnutrition: Secondary | ICD-10-CM | POA: Diagnosis present

## 2024-03-09 DIAGNOSIS — R197 Diarrhea, unspecified: Secondary | ICD-10-CM | POA: Diagnosis present

## 2024-03-09 DIAGNOSIS — R6510 Systemic inflammatory response syndrome (SIRS) of non-infectious origin without acute organ dysfunction: Secondary | ICD-10-CM | POA: Diagnosis not present

## 2024-03-09 LAB — GLUCOSE, CAPILLARY
Glucose-Capillary: 150 mg/dL — ABNORMAL HIGH (ref 70–99)
Glucose-Capillary: 152 mg/dL — ABNORMAL HIGH (ref 70–99)
Glucose-Capillary: 96 mg/dL (ref 70–99)
Glucose-Capillary: 89 mg/dL (ref 70–99)

## 2024-03-09 LAB — BASIC METABOLIC PANEL
Anion gap: 6 (ref 5–15)
Anion gap: 5 (ref 5–15)
Anion gap: 7 (ref 5–15)
Anion gap: 6 (ref 5–15)
BUN: 31 mg/dL — ABNORMAL HIGH (ref 8–23)
BUN: 29 mg/dL — ABNORMAL HIGH (ref 8–23)
BUN: 30 mg/dL — ABNORMAL HIGH (ref 8–23)
BUN: 25 mg/dL — ABNORMAL HIGH (ref 8–23)
CO2: 19 mmol/L — ABNORMAL LOW (ref 22–32)
CO2: 19 mmol/L — ABNORMAL LOW (ref 22–32)
CO2: 18 mmol/L — ABNORMAL LOW (ref 22–32)
CO2: 21 mmol/L — ABNORMAL LOW (ref 22–32)
Calcium: 7.1 mg/dL — ABNORMAL LOW (ref 8.9–10.3)
Calcium: 7.1 mg/dL — ABNORMAL LOW (ref 8.9–10.3)
Calcium: 7.3 mg/dL — ABNORMAL LOW (ref 8.9–10.3)
Calcium: 7.2 mg/dL — ABNORMAL LOW (ref 8.9–10.3)
Chloride: 95 mmol/L — ABNORMAL LOW (ref 98–111)
Chloride: 97 mmol/L — ABNORMAL LOW (ref 98–111)
Chloride: 97 mmol/L — ABNORMAL LOW (ref 98–111)
Chloride: 98 mmol/L (ref 98–111)
Creatinine, Ser: 0.55 mg/dL (ref 0.44–1.00)
Creatinine, Ser: 0.59 mg/dL (ref 0.44–1.00)
Creatinine, Ser: 0.61 mg/dL (ref 0.44–1.00)
Creatinine, Ser: 0.55 mg/dL (ref 0.44–1.00)
GFR, Estimated: >60 mL/min (ref >60)
GFR, Estimated: >60 mL/min (ref >60)
GFR, Estimated: >60 mL/min (ref >60)
GFR, Estimated: >60 mL/min (ref >60)
Glucose, Bld: 139 mg/dL — ABNORMAL HIGH (ref 70–99)
Glucose, Bld: 166 mg/dL — ABNORMAL HIGH (ref 70–99)
Glucose, Bld: 168 mg/dL — ABNORMAL HIGH (ref 70–99)
Glucose, Bld: 102 mg/dL — ABNORMAL HIGH (ref 70–99)
Potassium: 4.1 mmol/L (ref 3.5–5.1)
Potassium: 3.9 mmol/L (ref 3.5–5.1)
Potassium: 4 mmol/L (ref 3.5–5.1)
Potassium: 3.8 mmol/L (ref 3.5–5.1)
Sodium: 120 mmol/L — ABNORMAL LOW (ref 135–145)
Sodium: 121 mmol/L — ABNORMAL LOW (ref 135–145)
Sodium: 122 mmol/L — ABNORMAL LOW (ref 135–145)
Sodium: 125 mmol/L — ABNORMAL LOW (ref 135–145)

## 2024-03-09 LAB — HEPATIC FUNCTION PANEL
ALT: 16 U/L (ref 0–44)
AST: 37 U/L (ref 15–41)
Albumin: 1.6 g/dL — ABNORMAL LOW (ref 3.5–5.0)
Alkaline Phosphatase: 86 U/L (ref 38–126)
Bilirubin, Direct: 0.7 mg/dL — ABNORMAL HIGH (ref 0.0–0.2)
Indirect Bilirubin: 0.9 mg/dL (ref 0.3–0.9)
Total Bilirubin: 1.6 mg/dL — ABNORMAL HIGH (ref 0.0–1.2)
Total Protein: 5.3 g/dL — ABNORMAL LOW (ref 6.5–8.1)

## 2024-03-09 LAB — C DIFFICILE QUICK SCREEN W PCR REFLEX
C Diff antigen: NEGATIVE
C Diff interpretation: NOT DETECTED
C Diff toxin: NEGATIVE

## 2024-03-09 LAB — CBC
HCT: 28.1 % — ABNORMAL LOW (ref 36.0–46.0)
HCT: 31.3 % — ABNORMAL LOW (ref 36.0–46.0)
Hemoglobin: 10.2 g/dL — ABNORMAL LOW (ref 12.0–15.0)
Hemoglobin: 11.1 g/dL — ABNORMAL LOW (ref 12.0–15.0)
MCH: 29.5 pg (ref 26.0–34.0)
MCH: 29.7 pg (ref 26.0–34.0)
MCHC: 36.3 g/dL — ABNORMAL HIGH (ref 30.0–36.0)
MCHC: 35.5 g/dL (ref 30.0–36.0)
MCV: 81.2 fL (ref 80.0–100.0)
MCV: 83.7 fL (ref 80.0–100.0)
Platelets: 145 10*3/uL — ABNORMAL LOW (ref 150–400)
Platelets: 171 10*3/uL (ref 150–400)
RBC: 3.46 MIL/uL — ABNORMAL LOW (ref 3.87–5.11)
RBC: 3.74 MIL/uL — ABNORMAL LOW (ref 3.87–5.11)
RDW: 14 % (ref 11.5–15.5)
RDW: 14.2 % (ref 11.5–15.5)
WBC: 15.9 10*3/uL — ABNORMAL HIGH (ref 4.0–10.5)
WBC: 18.3 10*3/uL — ABNORMAL HIGH (ref 4.0–10.5)
nRBC: 0 % (ref 0.0–0.2)
nRBC: 0 % (ref 0.0–0.2)

## 2024-03-09 LAB — RESPIRATORY PANEL BY PCR

## 2024-03-09 LAB — OSMOLALITY
Osmolality: 274 mosm/kg — ABNORMAL LOW (ref 275–295)
Osmolality: 270 mosm/kg — ABNORMAL LOW (ref 275–295)

## 2024-03-09 LAB — PHOSPHORUS: Phosphorus: 3 mg/dL (ref 2.5–4.6)

## 2024-03-09 LAB — BRAIN NATRIURETIC PEPTIDE: B Natriuretic Peptide: 117.7 pg/mL — ABNORMAL HIGH (ref 0.0–100.0)

## 2024-03-09 LAB — MAGNESIUM: Magnesium: 1.9 mg/dL (ref 1.7–2.4)

## 2024-03-09 MED ORDER — VANCOMYCIN HCL 1250 MG/250ML IV SOLN
1250.0000 mg | INTRAVENOUS | Status: DC
  Administered 2024-03-09 – 2024-03-11 (×3): 1250 mg via INTRAVENOUS
  Filled 2024-03-09 (×4): qty 250

## 2024-03-09 MED ORDER — SODIUM CHLORIDE 1 G PO TABS
2.0000 g | ORAL_TABLET | Freq: Three times a day (TID) | ORAL | Status: AC
  Administered 2024-03-09: 2 g via ORAL
  Administered 2024-03-09: 1 g via ORAL
  Administered 2024-03-10 – 2024-03-11 (×4): 2 g via ORAL
  Filled 2024-03-09 (×6): qty 2

## 2024-03-09 MED ORDER — ADULT MULTIVITAMIN W/MINERALS CH
1.0000 | ORAL_TABLET | Freq: Every day | ORAL | Status: DC
  Administered 2024-03-10 – 2024-03-15 (×6): 1 via ORAL
  Filled 2024-03-09 (×6): qty 1

## 2024-03-09 MED ORDER — INSULIN GLARGINE 100 UNIT/ML ~~LOC~~ SOLN
8.0000 [IU] | Freq: Every day | SUBCUTANEOUS | Status: DC
  Administered 2024-03-09 – 2024-03-12 (×4): 8 [IU] via SUBCUTANEOUS
  Filled 2024-03-09 (×5): qty 0.08

## 2024-03-09 MED ORDER — FOLIC ACID 1 MG PO TABS
1.0000 mg | ORAL_TABLET | Freq: Every day | ORAL | Status: DC
  Administered 2024-03-10 – 2024-03-16 (×7): 1 mg via ORAL
  Filled 2024-03-09 (×7): qty 1

## 2024-03-09 MED ORDER — VITAMIN D 25 MCG (1000 UNIT) PO TABS
5000.0000 [IU] | ORAL_TABLET | Freq: Every day | ORAL | Status: DC
  Administered 2024-03-09 – 2024-03-16 (×8): 5000 [IU] via ORAL
  Filled 2024-03-09 (×7): qty 5

## 2024-03-09 MED ORDER — THIAMINE MONONITRATE 100 MG PO TABS
100.0000 mg | ORAL_TABLET | Freq: Every day | ORAL | Status: DC
  Administered 2024-03-10 – 2024-03-16 (×7): 100 mg via ORAL
  Filled 2024-03-09 (×7): qty 1

## 2024-03-09 MED ORDER — ENSURE ENLIVE PO LIQD
237.0000 mL | Freq: Two times a day (BID) | ORAL | Status: DC
Start: 2024-03-09 — End: 2024-03-09
  Administered 2024-03-09: 237 mL via ORAL

## 2024-03-09 MED ORDER — BOOST / RESOURCE BREEZE PO LIQD CUSTOM
1.0000 | Freq: Three times a day (TID) | ORAL | Status: DC
  Administered 2024-03-11 – 2024-03-16 (×10): 1 via ORAL

## 2024-03-09 MED ORDER — OXYCODONE HCL 5 MG PO TABS: 5.0000 mg | ORAL_TABLET | ORAL | Status: DC | PRN

## 2024-03-09 MED ORDER — FLUTICASONE PROPIONATE 50 MCG/ACT NA SUSP
1.0000 | Freq: Two times a day (BID) | NASAL | Status: DC | PRN
  Administered 2024-03-09 – 2024-03-15 (×3): 1 via NASAL
  Filled 2024-03-09: qty 16

## 2024-03-09 MED ORDER — LATANOPROSTENE BUNOD 0.024 % OP SOLN: 1.0000 [drp] | Freq: Every day | OPHTHALMIC | Status: DC

## 2024-03-09 MED ORDER — LOPERAMIDE HCL 2 MG PO CAPS: 2.0000 mg | ORAL_CAPSULE | ORAL | Status: DC | PRN

## 2024-03-09 MED ORDER — SODIUM BICARBONATE 650 MG PO TABS
650.0000 mg | ORAL_TABLET | Freq: Three times a day (TID) | ORAL | Status: AC
  Administered 2024-03-09 – 2024-03-11 (×6): 650 mg via ORAL
  Filled 2024-03-09 (×6): qty 1

## 2024-03-09 MED ORDER — FENOFIBRATE 160 MG PO TABS
160.0000 mg | ORAL_TABLET | Freq: Every day | ORAL | Status: DC
  Administered 2024-03-09 – 2024-03-16 (×8): 160 mg via ORAL
  Filled 2024-03-09 (×8): qty 1

## 2024-03-09 MED ORDER — CHLORHEXIDINE GLUCONATE CLOTH 2 % EX PADS
6.0000 | MEDICATED_PAD | Freq: Every day | CUTANEOUS | Status: DC
  Administered 2024-03-09 – 2024-03-16 (×8): 6 via TOPICAL

## 2024-03-09 MED ORDER — SACCHAROMYCES BOULARDII 250 MG PO CAPS
250.0000 mg | ORAL_CAPSULE | Freq: Two times a day (BID) | ORAL | Status: DC
  Administered 2024-03-09 – 2024-03-16 (×15): 250 mg via ORAL
  Filled 2024-03-09 (×15): qty 1

## 2024-03-09 NOTE — Progress Notes (Signed)
 [facility] Progress Note  Patient: Heather Burke    ZOX:[id number]  DOA: 03/08/2024     Date of Service: the patient was seen and examined on 03/09/2024  Chief Complaint  Patient presents with   Fever   Brief hospital course: Heather Burke is a 73 y.o. female with medical history significant of recently diagnosed invasive stage IV pancreatic cancer on chemotherapy, breast cancer 2016, HTN, HLD, DM, dCHF, hypothyroidism, depression, who presents with intermittent fever.   Patient states that she has intermittent fever for almost 3 weeks.  Last night she had fever 102.0 with chills.  No chest pain, cough, SOB.  Patient does not have nausea vomiting or abdominal pain.  Patient has intermittent diarrhea and is taking Imodium as needed.  No symptoms of UTI.  Patient has been taking Levaquin prescribed by Dr. Phylliss Bob of oncology since 3/18 without improvement.  She had first dose of chemotherapy on 3/18 per patient.  She has generalized weakness, poor appetite and decreased oral intake. Pt was seen in cancer center and found to have hypothermia, and had blood culture drawn,  and was sent to ED for further evaluation and treatment.  Patient states that she stopped drinking alcohol since February.     Data reviewed independently and ED Course: pt was found to have WBC 23.7, sodium 120, GFR > 60, potassium 4.4, magnesium 1.8, phosphorus 3.0, urinalysis (clear appearance, trace amount of leukocyte, negative bacteria, WBC 6-10), negative PCR for COVID, flu and RSV, lactic acid 1.6, procalcitonin 3.50.  Temperature 100.2 --> 94.2, initial blood pressure 71/30 which improved to 119/53, heart rate 56, 20, oxygen saturation 96-99% on room air.  Chest x-ray negative for infiltration.  Patient is admitted to telemetry bed as inpatient.     EKG: I have personally reviewed.  Sinus rhythm, QTc 423, low voltage.   Assessment and Plan:  # SIRS (systemic inflammatory response syndrome) [facility]): Patient meets  criteria for SIRS with WBC 23.7 and hypothermia with temperature 94.2.  Initially hypotensive, which improved to 119/53 before giving IVF.   No clear source of infection identified so far, will treat patient as SIRS.  If source of infection is identified, will change to sepsis.   Lactic acid is normal.  Procalcitonin 3.50.  Chest x-ray negative for infiltration.  UA showed trace amount of leukocyte with WBC 6-10, but patient denies symptoms of UTI.   -Continue vancomycin plus Zosyn (patient received 1 dose of cefepime and Flagyl in ED) -f/u Blood culture -IV fluid: 500 cc normal saline, s/p NS 75 cc/h -s/p 100 mg of solucortef as stress dose   # Malignant neoplasm of other parts of pancreas: Patient had 1 dose of chemotherapy -Follow-up with Dr. Wandalee Ferdinand oncology   # Hypotonic hyponatremia: Sodium 120, likely multifactorial etiology, including poor oral intake, Diovan/hydrochlorothiazide and  dehydration.  Mental status normal. Serum osmolality 270, low Continue fluid restriction S/p NS bolus and maintenance, no more IV fluid Encouraged sodium chloride tablets 2 g p.o. 3 times daily Monitor sodium level - avoid over correction too fast due to risk of central pontine myelinolysis -Hold Diovan-HCTZ   # Hypertension associated with diabetes -Hold blood pressure medications (diltiazem, metoprolol, Diovan-HCTZ) due to softer blood pressure   # Hypothyroidism -Synthroid   # Hyperlipemia -Tricor   # Chronic diastolic CHF (congestive heart failure) (HCC): 2D echo on 10/09/2015 showed EF of 55 to 60% with grade 1 diastolic dysfunction.  Patient has 1+ leg edema, no shortness of breath.  BNP is 117, does not seem to have CHF exacerbation.   Will hold off diuretics due to SIRS. -Watch volume status closely   # Diarrhea: Negative C. Difficile Diarrhea improving, started probiotics. # Metabolic acidosis, most likely due to diarrhea, started bicarbonate oral supplement. Check BMP daily     #  Alcohol dependence, daily use  -Patient stopped drinking alcohol since February   # Diabetes mellitus without complication: Recent A1c 6.9, well-controlled.  Patient is taking Lantus 16 units daily -Sliding scale insulin   # Depression -Continue home medications   # Protein-calorie malnutrition, moderate: Body weight 49.1 kg, BMI 19.17 -Ensure -Nutrition consult     Body mass index is 19.17 kg/m.  Interventions:  Diet: Diabetic diet DVT Prophylaxis: Subcutaneous Lovenox   Advance goals of care discussion: Full code  Family Communication: family was present at bedside, at the time of interview.  The pt provided permission to discuss medical plan with the family. Opportunity was given to ask question and all questions were answered satisfactorily.   Disposition:  Pt is from home, admitted with SIRS, diarrhea, hyponatremia, still has low sodium on IV antibiotics, which precludes a safe discharge. Discharge to home, when stable, most likely in few days.  Subjective: No significant events overnight, patient had 2 episodes of loose stools yesterday and 1 episode in the morning, feels improvement.  Denies any significant abdominal pain, no nausea vomiting.  Denied any chest pain or palpitations, no shortness of breath.  Physical Exam: General: NAD, lying comfortably Appear in no distress, affect appropriate Eyes: PERRLA ENT: Oral Mucosa Clear, moist  Neck: no JVD,  Cardiovascular: S1 and S2 Present, no Murmur,  Respiratory: good respiratory effort, Bilateral Air entry equal and Decreased, no Crackles, no wheezes Abdomen: Bowel Sound present, Soft and no tenderness,  Skin: no rashes Extremities: no Pedal edema, no calf tenderness Neurologic: without any new focal findings Gait not checked due to patient safety concerns  Vitals:   03/08/24 2253 03/08/24 2355 03/09/24 0744 03/09/24 1152  BP: (!) 118/56 (!) 111/59 114/62 (!) 107/56  Pulse: 62 (!) 59 69 71  Resp: 18 18 18 18    Temp: 98 F (36.7 C) 97.7 F (36.5 C) 97.6 F (36.4 C) (!) 97.1 F (36.2 C)  TempSrc: Oral     SpO2: 97% 97% 96% 95%  Weight: 49.1 kg     Height: 5\' 3"  (1.6 m)       Intake/Output Summary (Last 24 hours) at 03/09/2024 1347 Last data filed at 03/08/2024 2145 Gross per 24 hour  Intake 800.55 ml  Output --  Net 800.55 ml   Filed Weights   03/08/24 2253  Weight: 49.1 kg    Data Reviewed: I have personally reviewed and interpreted daily labs, tele strips, imagings as discussed above. I reviewed all nursing notes, pharmacy notes, vitals, pertinent old records I have discussed plan of care as described above with RN and patient/family.  CBC: Recent Labs  Lab 03/05/24 0807 03/07/24 1501 03/08/24 1321 03/09/24 0433 03/09/24 1115  WBC 20.3* 23.7* 23.7* 15.9* 18.3*  NEUTROABS 16.6* 21.6* 21.8*  --   --   HGB 11.2* 11.0* 10.7* 10.2* 11.1*  HCT 32.5* 32.9* 30.7* 28.1* 31.3*  MCV 85.8 87.0 84.3 81.2 83.7  PLT 362 222 155 145* 171   Basic Metabolic Panel: Recent Labs  Lab 03/07/24 1501 03/08/24 1321 03/08/24 1714 03/09/24 0116 03/09/24 0945 03/09/24 1115  NA 121* 120*  --  120* 121* 122*  K 4.3 4.4  --  4.1 3.9 4.0  CL 92* 91*  --  95* 97* 97*  CO2 20* 20*  --  19* 19* 18*  GLUCOSE 121* 146*  --  139* 166* 168*  BUN 29* 35*  --  31* 29* 30*  CREATININE 0.75 0.71  --  0.55 0.59 0.61  CALCIUM 7.2* 7.5*  --  7.1* 7.1* 7.3*  MG  --   --  1.8  --   --  1.9  PHOS  --   --  3.0  --   --  3.0    Studies: DG Chest 2 View Result Date: 03/08/2024 CLINICAL DATA:  Fever.  Sepsis. EXAM: CHEST - 2 VIEW COMPARISON:  Yesterday FINDINGS: Apical lordotic frontal radiograph. AP lateral views. Right Port-A-Cath tip superior caval/atrial junction. Midline trachea. Borderline cardiomegaly. Atherosclerosis in the transverse aorta. Tiny bilateral pleural effusions are similar. No pneumothorax. No congestive failure. Similar subsegmental atelectasis at both lung bases. IMPRESSION: No  significant change since one day prior. Tiny bilateral pleural effusions with subsegmental atelectasis at both lung bases. Aortic Atherosclerosis (ICD10-I70.0). Electronically Signed   By: Jeronimo Greaves M.D.   On: 03/08/2024 18:32    Scheduled Meds:  cholecalciferol  5,000 Units Oral Daily   enoxaparin (LOVENOX) injection  40 mg Subcutaneous Q24H   feeding supplement  237 mL Oral BID BM   fenofibrate  160 mg Oral Daily   insulin aspart  0-5 Units Subcutaneous QHS   insulin aspart  0-9 Units Subcutaneous TID WC   insulin glargine  8 Units Subcutaneous Daily   nicotine  21 mg Transdermal Daily   saccharomyces boulardii  250 mg Oral BID   sodium bicarbonate  650 mg Oral TID   sodium chloride  1 g Oral BID WC   sodium chloride  2 g Oral TID WC   Continuous Infusions:  piperacillin-tazobactam (ZOSYN)  IV 3.375 g (03/09/24 0609)   vancomycin 1,250 mg (03/09/24 1308)   PRN Meds: acetaminophen, ondansetron (ZOFRAN) IV, oxyCODONE  Time spent: 35 minutes  Author: Gillis Santa. MD [facility] 03/09/2024 1:47 PM  To reach On-call, see care teams to locate the attending and reach out to them via [email or web address]. If 7PM-7AM, please contact night-coverage If you still have difficulty reaching the attending provider, please page the [facility] (Director on Call) for [facility] on amion for assistance.

## 2024-03-09 NOTE — Progress Notes (Signed)
 Initial Nutrition Assessment  DOCUMENTATION CODES:   Not applicable  INTERVENTION:   Boost Breeze po TID, each supplement provides 250 kcal and 9 grams of protein  MVI, thiamine and folic acid po daily   Pt at high refeed risk; recommend monitor potassium, magnesium and phosphorus labs daily until stable  Daily weights   NUTRITION DIAGNOSIS:   Inadequate oral intake related to cancer and cancer related treatments as evidenced by per patient/family report.  GOAL:   Patient will meet greater than or equal to 90% of their needs  MONITOR:   PO intake, Supplement acceptance, Labs, Weight trends, Skin, I & O's  REASON FOR ASSESSMENT:   Consult Assessment of nutrition requirement/status  ASSESSMENT:   73 y.o. female with medical history significant of recently diagnosed invasive stage IV pancreatic cancer on chemotherapy, breast cancer s/p mastectomy 2016, HTN, HLD, DM, dCHF, hypothyroidism, depression, lupus, etoh abuse and GERD who is admitted with SIRS, diarrhea, fever and hyponatremia.  RD working remotely.  Spoke with pt via phone. Pt reports poor appetite and oral intake for several months. Pt reports taste changes despite using plastic utensils. Pt reports that she has lost down from 174lbs to 132lbs since August but reports that she has regained some weight since getting IV fluids. Per chart, pt is down ~20lbs(12%) since August; this is significant weight loss. Pt reports that she has been drinking [employer] and [employer] supplements at home. Pt reports that she prefers supplements to be cool but not cold. Pt reports that she has only eaten bites in hospital. Pt reports drinking water and eating some applesauce today. Pt's daughter brought her in a [employer] supplement from home. RD discussed with pt the importance of adequate nutrition needed to preserve lean muscle. Pt would like to try Boost Breeze. RD will add supplements and vitamins to help pt meet her estimated  needs. Pt is at high refeed risk. Pt reports ongoing diarrhea. RD will obtain NFPE at follow up.     Medications reviewed and include: D3, lovenox, folic acid, insulin, MVI, nicotine, florastor, Na bicarboante, NaCl, thiamine, zosyn, vancomycin    Labs reviewed: Na 122(L), K 4.0 wnl, BUN 30(H), P 3..0 wnl, Mg 1.9 wnl Wbc- 18.3(H) Cbgs- 152, 150 x 24 hrs  AIC 6.9(H)- 2/21  NUTRITION - FOCUSED PHYSICAL EXAM: Unable to perform at this time   Diet Order:   Diet Order             Diet Carb Modified Fluid consistency: Thin; Room service appropriate? Yes; Fluid restriction: 1500 mL Fluid  Diet effective now                  EDUCATION NEEDS:   Education needs have been addressed  Skin:  Skin Assessment: Reviewed RN Assessment (ecchymosis)  Last BM:  3/22- type 6  Height:   Ht Readings from Last 1 Encounters:  03/08/24 5\' 3"  (1.6 m)    Weight:   Wt Readings from Last 1 Encounters:  03/08/24 49.1 kg    Ideal Body Weight:  52.2 kg  BMI:  Body mass index is 19.17 kg/m.  Estimated Nutritional Needs:   Kcal:  1700-1900kcal/day  Protein:  85-95g/day  Fluid:  1.5-1.7L/day  Betsey Holiday MS, RD, LDN If unable to be reached, please send secure chat to "RD inpatient" available from 8:00a-4:00p daily

## 2024-03-09 NOTE — Plan of Care (Signed)
 Pt alert and oriented, denies any c/o pain. Pt receiving IV Vanco /Zosyn for elevated WBC and blood cultures pending. Stool negative for Cdiff. Family at bedside.  Problem: Education: Goal: Ability to describe self-care measures that may prevent or decrease complications (Diabetes Survival Skills Education) will improve Outcome: Progressing Goal: Individualized Educational Video(s) Outcome: Progressing   Problem: Coping: Goal: Ability to adjust to condition or change in health will improve Outcome: Progressing   Problem: Fluid Volume: Goal: Ability to maintain a balanced intake and output will improve Outcome: Progressing   Problem: Health Behavior/Discharge Planning: Goal: Ability to identify and utilize available resources and services will improve Outcome: Progressing Goal: Ability to manage health-related needs will improve Outcome: Progressing   Problem: Metabolic: Goal: Ability to maintain appropriate glucose levels will improve Outcome: Progressing   Problem: Nutritional: Goal: Maintenance of adequate nutrition will improve Outcome: Progressing Goal: Progress toward achieving an optimal weight will improve Outcome: Progressing   Problem: Skin Integrity: Goal: Risk for impaired skin integrity will decrease Outcome: Progressing   Problem: Tissue Perfusion: Goal: Adequacy of tissue perfusion will improve Outcome: Progressing   Problem: Education: Goal: Knowledge of General Education information will improve Description: Including pain rating scale, medication(s)/side effects and non-pharmacologic comfort measures Outcome: Progressing   Problem: Health Behavior/Discharge Planning: Goal: Ability to manage health-related needs will improve Outcome: Progressing   Problem: Clinical Measurements: Goal: Ability to maintain clinical measurements within normal limits will improve Outcome: Progressing Goal: Will remain free from infection Outcome: Progressing Goal:  Diagnostic test results will improve Outcome: Progressing Goal: Respiratory complications will improve Outcome: Progressing Goal: Cardiovascular complication will be avoided Outcome: Progressing   Problem: Activity: Goal: Risk for activity intolerance will decrease Outcome: Progressing   Problem: Nutrition: Goal: Adequate nutrition will be maintained Outcome: Progressing   Problem: Coping: Goal: Level of anxiety will decrease Outcome: Progressing   Problem: Elimination: Goal: Will not experience complications related to bowel motility Outcome: Progressing Goal: Will not experience complications related to urinary retention Outcome: Progressing   Problem: Pain Managment: Goal: General experience of comfort will improve and/or be controlled Outcome: Progressing   Problem: Safety: Goal: Ability to remain free from injury will improve Outcome: Progressing   Problem: Skin Integrity: Goal: Risk for impaired skin integrity will decrease Outcome: Progressing

## 2024-03-09 NOTE — Progress Notes (Signed)
 Mobility Specialist - Progress Note   03/09/24 1117  Mobility  Activity Ambulated with assistance in hallway;Stood at bedside;Dangled on edge of bed  Level of Assistance Standby assist, set-up cues, supervision of patient - no hands on  Assistive Device None  Distance Ambulated (ft) 240 ft  Activity Response Tolerated well  Mobility Referral Yes  Mobility visit 1 Mobility  Mobility Specialist Start Time (ACUTE ONLY) 1032  Mobility Specialist Stop Time (ACUTE ONLY) 1052  Mobility Specialist Time Calculation (min) (ACUTE ONLY) 20 min   Pt sitting EOB on RA upon arrival. Pt STS and ambulates in hallway SBA with no LOB noted. Pt returns to EOB with needs in reach and daughter present.   Terrilyn Saver  Mobility Specialist  03/09/24 11:22 AM

## 2024-03-09 NOTE — Progress Notes (Signed)
 Mobility Specialist - Progress Note   03/09/24 1045  Mobility  Activity Ambulated with assistance to bathroom;Stood at bedside;Dangled on edge of bed  Level of Assistance Standby assist, set-up cues, supervision of patient - no hands on  Assistive Device None  Distance Ambulated (ft) 10 ft  Activity Response Tolerated well  Mobility Referral Yes  Mobility visit 1 Mobility  Mobility Specialist Start Time (ACUTE ONLY) 0932  Mobility Specialist Stop Time (ACUTE ONLY) 0956  Mobility Specialist Time Calculation (min) (ACUTE ONLY) 24 min   Pt semi-supine in bed on RA upon arrival. Pt completes bed mobility, STS, and ambulates to/from bathroom SBA with no LOB noted. Pt returns to EOB with needs in reach and daughter present.   Terrilyn Saver  Mobility Specialist  03/09/24 10:47 AM

## 2024-03-10 DIAGNOSIS — R6510 Systemic inflammatory response syndrome (SIRS) of non-infectious origin without acute organ dysfunction: Secondary | ICD-10-CM | POA: Diagnosis not present

## 2024-03-10 LAB — CBC
HCT: 27.7 % — ABNORMAL LOW (ref 36.0–46.0)
Hemoglobin: 9.8 g/dL — ABNORMAL LOW (ref 12.0–15.0)
MCH: 28.8 pg (ref 26.0–34.0)
MCHC: 35.4 g/dL (ref 30.0–36.0)
MCV: 81.5 fL (ref 80.0–100.0)
Platelets: 101 10*3/uL — ABNORMAL LOW (ref 150–400)
RBC: 3.4 MIL/uL — ABNORMAL LOW (ref 3.87–5.11)
RDW: 14.2 % (ref 11.5–15.5)
WBC: 9.9 10*3/uL (ref 4.0–10.5)
nRBC: 0 % (ref 0.0–0.2)

## 2024-03-10 LAB — BASIC METABOLIC PANEL
Anion gap: 7 (ref 5–15)
BUN: 22 mg/dL (ref 8–23)
CO2: 21 mmol/L — ABNORMAL LOW (ref 22–32)
Calcium: 7 mg/dL — ABNORMAL LOW (ref 8.9–10.3)
Chloride: 98 mmol/L (ref 98–111)
Creatinine, Ser: 0.55 mg/dL (ref 0.44–1.00)
GFR, Estimated: >60 mL/min (ref >60)
Glucose, Bld: 86 mg/dL (ref 70–99)
Potassium: 3.4 mmol/L — ABNORMAL LOW (ref 3.5–5.1)
Sodium: 126 mmol/L — ABNORMAL LOW (ref 135–145)

## 2024-03-10 LAB — GLUCOSE, CAPILLARY
Glucose-Capillary: 80 mg/dL (ref 70–99)
Glucose-Capillary: 83 mg/dL (ref 70–99)
Glucose-Capillary: 98 mg/dL (ref 70–99)

## 2024-03-10 LAB — PHOSPHORUS: Phosphorus: 2.5 mg/dL (ref 2.5–4.6)

## 2024-03-10 LAB — MAGNESIUM: Magnesium: 1.9 mg/dL (ref 1.7–2.4)

## 2024-03-10 MED ORDER — LATANOPROST 0.005 % OP SOLN
1.0000 [drp] | Freq: Every day | OPHTHALMIC | Status: DC
  Administered 2024-03-10 – 2024-03-15 (×6): 1 [drp] via OPHTHALMIC
  Filled 2024-03-10: qty 2.5

## 2024-03-10 MED ORDER — POTASSIUM CHLORIDE CRYS ER 20 MEQ PO TBCR
40.0000 meq | EXTENDED_RELEASE_TABLET | Freq: Once | ORAL | Status: AC
  Administered 2024-03-10: 40 meq via ORAL
  Filled 2024-03-10: qty 2

## 2024-03-10 MED ORDER — LATANOPROSTENE BUNOD 0.024 % OP SOLN: 1.0000 [drp] | Freq: Every day | OPHTHALMIC | Status: DC

## 2024-03-10 NOTE — Plan of Care (Signed)

## 2024-03-10 NOTE — Progress Notes (Signed)
 [facility] Progress Note  Patient: Heather Burke    WJX:[id number]  DOA: 03/08/2024     Date of Service: the patient was seen and examined on 03/10/2024  Chief Complaint  Patient presents with   Fever   Brief hospital course: Heather Burke is a 73 y.o. female with medical history significant of recently diagnosed invasive stage IV pancreatic cancer on chemotherapy, breast cancer 2016, HTN, HLD, DM, dCHF, hypothyroidism, depression, who presents with intermittent fever.   Patient states that she has intermittent fever for almost 3 weeks.  Last night she had fever 102.0 with chills.  No chest pain, cough, SOB.  Patient does not have nausea vomiting or abdominal pain.  Patient has intermittent diarrhea and is taking Imodium as needed.  No symptoms of UTI.  Patient has been taking Levaquin prescribed by Dr. Phylliss Bob of oncology since 3/18 without improvement.  She had first dose of chemotherapy on 3/18 per patient.  She has generalized weakness, poor appetite and decreased oral intake. Pt was seen in cancer center and found to have hypothermia, and had blood culture drawn,  and was sent to ED for further evaluation and treatment.  Patient states that she stopped drinking alcohol since February.     Data reviewed independently and ED Course: pt was found to have WBC 23.7, sodium 120, GFR > 60, potassium 4.4, magnesium 1.8, phosphorus 3.0, urinalysis (clear appearance, trace amount of leukocyte, negative bacteria, WBC 6-10), negative PCR for COVID, flu and RSV, lactic acid 1.6, procalcitonin 3.50.  Temperature 100.2 --> 94.2, initial blood pressure 71/30 which improved to 119/53, heart rate 56, 20, oxygen saturation 96-99% on room air.  Chest x-ray negative for infiltration.  Patient is admitted to telemetry bed as inpatient.     EKG: I have personally reviewed.  Sinus rhythm, QTc 423, low voltage.   Assessment and Plan:  # SIRS (systemic inflammatory response syndrome) [facility]): Patient meets  criteria for SIRS with WBC 23.7 and hypothermia with temperature 94.2.  Initially hypotensive, which improved to 119/53 before giving IVF.   No clear source of infection identified so far, will treat patient as SIRS.  If source of infection is identified, will change to sepsis.   Lactic acid is normal.  Procalcitonin 3.50.  Chest x-ray negative for infiltration.  UA showed trace amount of leukocyte with WBC 6-10, but patient denies symptoms of UTI.   -Continue vancomycin plus Zosyn (patient received 1 dose of cefepime and Flagyl in ED) -f/u Blood culture -IV fluid: 500 cc normal saline, s/p NS 75 cc/h -s/p 100 mg of solucortef as stress dose   # Malignant neoplasm of other parts of pancreas: Patient had 1 dose of chemotherapy -Follow-up with Dr. Wandalee Ferdinand oncology   # Hypotonic hyponatremia: Sodium 120, likely multifactorial etiology, including poor oral intake, Diovan/hydrochlorothiazide and  dehydration.  Mental status normal. Serum osmolality 270, low Continue fluid restriction S/p NS bolus and maintenance, no more IV fluid Started Salt Tabs 2 g p.o. 3 times daily Monitor sodium level - avoid over correction too fast due to risk of central pontine myelinolysis -Hold Diovan-HCTZ 3/23 Na 126 today     # Hypertension associated with diabetes -Hold blood pressure medications (diltiazem, metoprolol, Diovan-HCTZ) due to softer blood pressure   # Hypothyroidism -Synthroid   # Hyperlipemia -Tricor   # Chronic diastolic CHF (congestive heart failure) (HCC): 2D echo on 10/09/2015 showed EF of 55 to 60% with grade 1 diastolic dysfunction.  Patient has 1+ leg edema, no  shortness of breath.   BNP is 117, does not seem to have CHF exacerbation.   Will hold off diuretics due to SIRS. -Watch volume status closely   # Diarrhea: Negative C. Difficile Diarrhea improving, started probiotics. # Metabolic acidosis, most likely due to diarrhea, started bicarbonate oral supplement. Check BMP daily      # Alcohol dependence, daily use  -Patient stopped drinking alcohol since February   # Diabetes mellitus without complication: Recent A1c 6.9, well-controlled.  Patient is taking Lantus 16 units daily -Sliding scale insulin   # Depression -Continue home medications   # Protein-calorie malnutrition, moderate: Body weight 49.1 kg, BMI 19.17 -Ensure -Nutrition consult     Body mass index is 22.96 kg/m.  Interventions:  Diet: Diabetic diet DVT Prophylaxis: Subcutaneous Lovenox   Advance goals of care discussion: Full code  Family Communication: family was present at bedside, at the time of interview.  The pt provided permission to discuss medical plan with the family. Opportunity was given to ask question and all questions were answered satisfactorily.   Disposition:  Pt is from home, admitted with SIRS, diarrhea, hyponatremia, still has low sodium on IV antibiotics, which precludes a safe discharge. Discharge to home, when stable, most likely in 2-3 days when sodium level improves   Subjective: No significant events overnight, patient has 1 loose stool today, feels improvement.  Denied any chest pain or palpitation no shortness of breath, no fever or chills.   Physical Exam: General: NAD, lying comfortably Appear in no distress, affect appropriate Eyes: PERRLA ENT: Oral Mucosa Clear, moist  Neck: no JVD,  Cardiovascular: S1 and S2 Present, no Murmur,  Respiratory: good respiratory effort, Bilateral Air entry equal and Decreased, no Crackles, no wheezes Abdomen: Bowel Sound present, Soft and no tenderness,  Skin: no rashes Extremities: no Pedal edema, no calf tenderness Neurologic: without any new focal findings Gait not checked due to patient safety concerns  Vitals:   03/10/24 0029 03/10/24 0400 03/10/24 0500 03/10/24 0931  BP: 126/60 116/66  (!) 113/56  Pulse: 94 (!) 110  83  Resp: 18 18  18   Temp: 99.6 F (37.6 C) 98.3 F (36.8 C)  97.8 F (36.6 C)   TempSrc:  Oral  Oral  SpO2: 95% 99%  100%  Weight:   58.8 kg   Height:        Intake/Output Summary (Last 24 hours) at 03/10/2024 1339 Last data filed at 03/10/2024 1100 Gross per 24 hour  Intake 682.49 ml  Output --  Net 682.49 ml   Filed Weights   03/08/24 2253 03/10/24 0500  Weight: 49.1 kg 58.8 kg    Data Reviewed: I have personally reviewed and interpreted daily labs, tele strips, imagings as discussed above. I reviewed all nursing notes, pharmacy notes, vitals, pertinent old records I have discussed plan of care as described above with RN and patient/family.  CBC: Recent Labs  Lab 03/05/24 0807 03/07/24 1501 03/08/24 1321 03/09/24 0433 03/09/24 1115 03/10/24 0507  WBC 20.3* 23.7* 23.7* 15.9* 18.3* 9.9  NEUTROABS 16.6* 21.6* 21.8*  --   --   --   HGB 11.2* 11.0* 10.7* 10.2* 11.1* 9.8*  HCT 32.5* 32.9* 30.7* 28.1* 31.3* 27.7*  MCV 85.8 87.0 84.3 81.2 83.7 81.5  PLT 362 222 155 145* 171 101*   Basic Metabolic Panel: Recent Labs  Lab 03/08/24 1714 03/09/24 0116 03/09/24 0945 03/09/24 1115 03/09/24 2110 03/10/24 0507  NA  --  120* 121* 122* 125* 126*  K  --  4.1 3.9 4.0 3.8 3.4*  CL  --  95* 97* 97* 98 98  CO2  --  19* 19* 18* 21* 21*  GLUCOSE  --  139* 166* 168* 102* 86  BUN  --  31* 29* 30* 25* 22  CREATININE  --  0.55 0.59 0.61 0.55 0.55  CALCIUM  --  7.1* 7.1* 7.3* 7.2* 7.0*  MG 1.8  --   --  1.9  --  1.9  PHOS 3.0  --   --  3.0  --  2.5    Studies: No results found.   Scheduled Meds:  Chlorhexidine Gluconate Cloth  6 each Topical Daily   cholecalciferol  5,000 Units Oral Daily   enoxaparin (LOVENOX) injection  40 mg Subcutaneous Q24H   feeding supplement  1 Container Oral TID BM   fenofibrate  160 mg Oral Daily   folic acid  1 mg Oral Daily   insulin aspart  0-5 Units Subcutaneous QHS   insulin aspart  0-9 Units Subcutaneous TID WC   insulin glargine  8 Units Subcutaneous Daily   Latanoprostene Bunod  1 drop Both Eyes QHS   multivitamin  with minerals  1 tablet Oral Daily   nicotine  21 mg Transdermal Daily   saccharomyces boulardii  250 mg Oral BID   sodium bicarbonate  650 mg Oral TID   sodium chloride  2 g Oral TID WC   thiamine  100 mg Oral Daily   Continuous Infusions:  piperacillin-tazobactam (ZOSYN)  IV Stopped (03/10/24 0918)   vancomycin 1,250 mg (03/09/24 1308)   PRN Meds: acetaminophen, fluticasone, ondansetron (ZOFRAN) IV, oxyCODONE  Time spent: 35 minutes  Author: Gillis Santa. MD [facility] 03/10/2024 1:39 PM  To reach On-call, see care teams to locate the attending and reach out to them via [email or web address]. If 7PM-7AM, please contact night-coverage If you still have difficulty reaching the attending provider, please page the [facility] (Director on Call) for [facility] on amion for assistance.

## 2024-03-11 ENCOUNTER — Telehealth: Payer: Self-pay

## 2024-03-11 DIAGNOSIS — R6510 Systemic inflammatory response syndrome (SIRS) of non-infectious origin without acute organ dysfunction: Secondary | ICD-10-CM | POA: Diagnosis not present

## 2024-03-11 LAB — BASIC METABOLIC PANEL
Anion gap: 6 (ref 5–15)
BUN: 15 mg/dL (ref 8–23)
CO2: 22 mmol/L (ref 22–32)
Calcium: 6.9 mg/dL — ABNORMAL LOW (ref 8.9–10.3)
Chloride: 99 mmol/L (ref 98–111)
Creatinine, Ser: 0.53 mg/dL (ref 0.44–1.00)
GFR, Estimated: >60 mL/min (ref >60)
Glucose, Bld: 141 mg/dL — ABNORMAL HIGH (ref 70–99)
Potassium: 3.4 mmol/L — ABNORMAL LOW (ref 3.5–5.1)
Sodium: 127 mmol/L — ABNORMAL LOW (ref 135–145)

## 2024-03-11 LAB — CBC
HCT: 24.8 % — ABNORMAL LOW (ref 36.0–46.0)
Hemoglobin: 8.9 g/dL — ABNORMAL LOW (ref 12.0–15.0)
MCH: 29.9 pg (ref 26.0–34.0)
MCHC: 35.9 g/dL (ref 30.0–36.0)
MCV: 83.2 fL (ref 80.0–100.0)
Platelets: 56 10*3/uL — ABNORMAL LOW (ref 150–400)
RBC: 2.98 MIL/uL — ABNORMAL LOW (ref 3.87–5.11)
RDW: 14.2 % (ref 11.5–15.5)
WBC: 7.9 10*3/uL (ref 4.0–10.5)
nRBC: 0 % (ref 0.0–0.2)

## 2024-03-11 LAB — GLUCOSE, CAPILLARY
Glucose-Capillary: 96 mg/dL (ref 70–99)
Glucose-Capillary: 90 mg/dL (ref 70–99)
Glucose-Capillary: 157 mg/dL — ABNORMAL HIGH (ref 70–99)
Glucose-Capillary: 142 mg/dL — ABNORMAL HIGH (ref 70–99)

## 2024-03-11 LAB — PHOSPHORUS: Phosphorus: 2.4 mg/dL — ABNORMAL LOW (ref 2.5–4.6)

## 2024-03-11 LAB — MAGNESIUM: Magnesium: 1.8 mg/dL (ref 1.7–2.4)

## 2024-03-11 MED ORDER — K PHOS MONO-SOD PHOS DI & MONO 155-852-130 MG PO TABS
500.0000 mg | ORAL_TABLET | Freq: Three times a day (TID) | ORAL | Status: AC
  Administered 2024-03-11 (×3): 500 mg via ORAL
  Filled 2024-03-11 (×3): qty 2

## 2024-03-11 MED ORDER — SODIUM CHLORIDE 1 G PO TABS
2.0000 g | ORAL_TABLET | Freq: Three times a day (TID) | ORAL | Status: DC
  Administered 2024-03-11 – 2024-03-13 (×9): 2 g via ORAL
  Filled 2024-03-11 (×9): qty 2

## 2024-03-11 NOTE — Telephone Encounter (Signed)
 Patient was identified as falling into the [place] Measure - Diabetes.   Patient was: Patient presently inpatient at [facility]. A1C is down on 02/09/24 to 6.9 from 10.2.  Patient will need Hospital follow up after discharge and routine DM follow up in May or June

## 2024-03-11 NOTE — Progress Notes (Signed)
 Entered patients room to administer ordered medication. Patient sitting on side of bed. Assessed port site. Dried blood noted around site, saturating dressing. Charge Nurse, Lupita Leash notified and entered room for a second look.  New port placement for chemotherapy. Port first accessed on  03/05/24 for initial chemotherapy. IV team consult entered.

## 2024-03-11 NOTE — Progress Notes (Signed)
 [facility] Progress Note  Patient: Heather Burke    ZOX:[id number]  DOA: 03/08/2024     Date of Service: the patient was seen and examined on 03/11/2024  Chief Complaint  Patient presents with   Fever   Brief hospital course: DARRIELLE PFLIEGER is a 73 y.o. female with medical history significant of recently diagnosed invasive stage IV pancreatic cancer on chemotherapy, breast cancer 2016, HTN, HLD, DM, dCHF, hypothyroidism, depression, who presents with intermittent fever.   Patient states that she has intermittent fever for almost 3 weeks.  Last night she had fever 102.0 with chills.  No chest pain, cough, SOB.  Patient does not have nausea vomiting or abdominal pain.  Patient has intermittent diarrhea and is taking Imodium as needed.  No symptoms of UTI.  Patient has been taking Levaquin prescribed by Dr. Phylliss Bob of oncology since 3/18 without improvement.  She had first dose of chemotherapy on 3/18 per patient.  She has generalized weakness, poor appetite and decreased oral intake. Pt was seen in cancer center and found to have hypothermia, and had blood culture drawn,  and was sent to ED for further evaluation and treatment.  Patient states that she stopped drinking alcohol since February.     Data reviewed independently and ED Course: pt was found to have WBC 23.7, sodium 120, GFR > 60, potassium 4.4, magnesium 1.8, phosphorus 3.0, urinalysis (clear appearance, trace amount of leukocyte, negative bacteria, WBC 6-10), negative PCR for COVID, flu and RSV, lactic acid 1.6, procalcitonin 3.50.  Temperature 100.2 --> 94.2, initial blood pressure 71/30 which improved to 119/53, heart rate 56, 20, oxygen saturation 96-99% on room air.  Chest x-ray negative for infiltration.  Patient is admitted to telemetry bed as inpatient.     EKG: I have personally reviewed.  Sinus rhythm, QTc 423, low voltage.   Assessment and Plan:  # SIRS (systemic inflammatory response syndrome) [facility]): Patient meets  criteria for SIRS with WBC 23.7 and hypothermia with temperature 94.2.  Initially hypotensive, which improved to 119/53 before giving IVF.   No clear source of infection identified so far, will treat patient as SIRS.  If source of infection is identified, will change to sepsis.   Lactic acid is normal.  Procalcitonin 3.50.  Chest x-ray negative for infiltration.  UA showed trace amount of leukocyte with WBC 6-10, but patient denies symptoms of UTI. -Continue vancomycin plus Zosyn (patient received 1 dose of cefepime and Flagyl in ED) -f/u Blood culture -IV fluid: 500 cc normal saline, s/p NS 75 cc/h -s/p 100 mg of solucortef as stress dose   # Malignant neoplasm of other parts of pancreas: Patient had 1 dose of chemotherapy -Follow-up with Dr. Wandalee Ferdinand oncology   # Hypotonic hyponatremia: Sodium 120, likely multifactorial etiology, including poor oral intake, Diovan/hydrochlorothiazide and  dehydration.  Mental status normal. Serum osmolality 270, low Continue fluid restriction S/p NS bolus and maintenance, no more IV fluid Started Salt Tabs 2 g p.o. 3 times daily Monitor sodium level - avoid over correction too fast due to risk of central pontine myelinolysis -Hold Diovan-HCTZ 3/24 Na 127 today    # Hypokalemia mild, monitor # Hypophosphatemia, Phos repleted. Monitor electrolytes and replete as needed.   # Hypertension associated with diabetes -Hold blood pressure medications (diltiazem, metoprolol, Diovan-HCTZ) due to softer blood pressure   # Hypothyroidism -Synthroid   # Hyperlipemia -Tricor   # Chronic diastolic CHF (congestive heart failure) (HCC): 2D echo on 10/09/2015 showed EF of 55 to  60% with grade 1 diastolic dysfunction.  Patient has 1+ leg edema, no shortness of breath.   BNP is 117, does not seem to have CHF exacerbation.   Will hold off diuretics due to SIRS. -Watch volume status closely   # Diarrhea: Negative C. Difficile Diarrhea improving, started  probiotics. # Metabolic acidosis, most likely due to diarrhea, started bicarbonate oral supplement. Check BMP daily     # Alcohol dependence, daily use  -Patient stopped drinking alcohol since February   # Diabetes mellitus without complication: Recent A1c 6.9, well-controlled.  Patient is taking Lantus 16 units daily -Sliding scale insulin   # Depression -Continue home medications   # Protein-calorie malnutrition, moderate: Body weight 49.1 kg, BMI 19.17 -Ensure -Nutrition consult     Body mass index is 20.27 kg/m.  Interventions:  Diet: Diabetic diet DVT Prophylaxis: Subcutaneous Lovenox   Advance goals of care discussion: Full code  Family Communication: family was present at bedside, at the time of interview.  The pt provided permission to discuss medical plan with the family. Opportunity was given to ask question and all questions were answered satisfactorily.   Disposition:  Pt is from home, admitted with SIRS, diarrhea, hyponatremia, still has low sodium on IV antibiotics, which precludes a safe discharge. Discharge to home, when stable, most likely in 2-3 days when sodium level improves   Subjective: No significant events overnight, patient had 2-3 loose bowel movements yesterday and 1 episode early morning today.  Denied any abdominal pain, no nausea vomiting.  Patient was feeling cold in the room and feeling better after warm blankets.  Denied any fever.   Physical Exam: General: NAD, lying comfortably Appear in no distress, affect appropriate Eyes: PERRLA ENT: Oral Mucosa Clear, moist  Neck: no JVD,  Cardiovascular: S1 and S2 Present, no Murmur,  Respiratory: good respiratory effort, Bilateral Air entry equal and Decreased, no Crackles, no wheezes Abdomen: Bowel Sound present, Soft and no tenderness,  Skin: no rashes Extremities: no Pedal edema, no calf tenderness Neurologic: without any new focal findings Gait not checked due to patient safety  concerns  Vitals:   03/10/24 2103 03/10/24 2207 03/11/24 0500 03/11/24 0746  BP: (!) 143/59   130/79  Pulse: (!) 109   84  Resp:    15  Temp:  99.3 F (37.4 C)  98.2 F (36.8 C)  TempSrc:  Oral  Oral  SpO2: 93%   90%  Weight:   51.9 kg   Height:        Intake/Output Summary (Last 24 hours) at 03/11/2024 1358 Last data filed at 03/10/2024 1428 Gross per 24 hour  Intake 0 ml  Output --  Net 0 ml   Filed Weights   03/08/24 2253 03/10/24 0500 03/11/24 0500  Weight: 49.1 kg 58.8 kg 51.9 kg    Data Reviewed: I have personally reviewed and interpreted daily labs, tele strips, imagings as discussed above. I reviewed all nursing notes, pharmacy notes, vitals, pertinent old records I have discussed plan of care as described above with RN and patient/family.  CBC: Recent Labs  Lab 03/05/24 0807 03/07/24 1501 03/08/24 1321 03/09/24 0433 03/09/24 1115 03/10/24 0507 03/11/24 0658  WBC 20.3* 23.7* 23.7* 15.9* 18.3* 9.9 7.9  NEUTROABS 16.6* 21.6* 21.8*  --   --   --   --   HGB 11.2* 11.0* 10.7* 10.2* 11.1* 9.8* 8.9*  HCT 32.5* 32.9* 30.7* 28.1* 31.3* 27.7* 24.8*  MCV 85.8 87.0 84.3 81.2 83.7 81.5 83.2  PLT  362 222 155 145* 171 101* 56*   Basic Metabolic Panel: Recent Labs  Lab 03/08/24 1714 03/09/24 0116 03/09/24 0945 03/09/24 1115 03/09/24 2110 03/10/24 0507 03/11/24 0658  NA  --    < > 121* 122* 125* 126* 127*  K  --    < > 3.9 4.0 3.8 3.4* 3.4*  CL  --    < > 97* 97* 98 98 99  CO2  --    < > 19* 18* 21* 21* 22  GLUCOSE  --    < > 166* 168* 102* 86 141*  BUN  --    < > 29* 30* 25* 22 15  CREATININE  --    < > 0.59 0.61 0.55 0.55 0.53  CALCIUM  --    < > 7.1* 7.3* 7.2* 7.0* 6.9*  MG 1.8  --   --  1.9  --  1.9 1.8  PHOS 3.0  --   --  3.0  --  2.5 2.4*   < > = values in this interval not displayed.    Studies: No results found.   Scheduled Meds:  Chlorhexidine Gluconate Cloth  6 each Topical Daily   cholecalciferol  5,000 Units Oral Daily   feeding  supplement  1 Container Oral TID BM   fenofibrate  160 mg Oral Daily   folic acid  1 mg Oral Daily   insulin aspart  0-5 Units Subcutaneous QHS   insulin aspart  0-9 Units Subcutaneous TID WC   insulin glargine  8 Units Subcutaneous Daily   latanoprost  1 drop Both Eyes QHS   multivitamin with minerals  1 tablet Oral Daily   nicotine  21 mg Transdermal Daily   phosphorus  500 mg Oral TID   saccharomyces boulardii  250 mg Oral BID   sodium chloride  2 g Oral TID WC   thiamine  100 mg Oral Daily   Continuous Infusions:  piperacillin-tazobactam (ZOSYN)  IV 3.375 g (03/11/24 0549)   vancomycin 1,250 mg (03/11/24 1334)   PRN Meds: acetaminophen, fluticasone, ondansetron (ZOFRAN) IV, oxyCODONE  Time spent: 35 minutes  Author: Gillis Santa. MD [facility] 03/11/2024 1:58 PM  To reach On-call, see care teams to locate the attending and reach out to them via [email or web address]. If 7PM-7AM, please contact night-coverage If you still have difficulty reaching the attending provider, please page the [facility] (Director on Call) for [facility] on amion for assistance.

## 2024-03-11 NOTE — Plan of Care (Signed)

## 2024-03-11 NOTE — Consult Note (Signed)
 Hematology/Oncology Consult note [facility] [phone] [phone]  Patient Care Team: Sallee Provencal, FNP as PCP - General (Family Medicine) Irene Limbo., MD as Consulting Physician (Ophthalmology) Ronney Asters, Jackelyn Poling, RPH-CPP as Pharmacist Benita Gutter, RN as Oncology Nurse Navigator Creig Hines, MD as Consulting Physician (Oncology)   Name of the patient: Heather Burke  [id number]  17-Feb-1951    Reason for referral- ***   Referring physician- ***  Date of visit: @TODAY @   History of presenting illness- ***  ECOG PS- ***  Pain scale- ***   Review of systems- ROS  Allergies  Allergen Reactions   Metformin And Related Diarrhea    SEVERE DIARRHEA   Amlodipine Nausea Only and Other (See Comments)    Stomach cramping   Atorvastatin Nausea And Vomiting   Lisinopril Cough   Pravastatin Nausea And Vomiting   Icosapent Ethyl (Epa Ethyl Ester) (Fish) Palpitations   Nifedipine Er Other (See Comments)    headache    Patient Active Problem List   Diagnosis Date Noted   Protein-calorie malnutrition, moderate (HCC) 03/09/2024   Diarrhea 03/09/2024   SIRS (systemic inflammatory response syndrome) (HCC) 03/08/2024   Diabetes mellitus without complication (HCC) 03/08/2024   Chronic diastolic CHF (congestive heart failure) (HCC) 03/08/2024   Depression 03/08/2024   Hyponatremia 03/08/2024   Malignant neoplasm of other parts of pancreas (HCC) 02/23/2024   Left upper quadrant abdominal pain 02/09/2024   Sweating increase 02/09/2024   Hypokalemia 02/09/2024   Abnormal skin growth 08/09/2023   Sebaceous cyst of skin of breast 08/09/2023   Fatigue 05/09/2023   Eye pressure 07/15/2022   Stress and adjustment reaction 07/14/2022   Sore throat 04/27/2022   Chronic pain of right knee 01/14/2022   Need for diphtheria-tetanus-pertussis (Tdap) vaccine 01/14/2022   Encounter for subsequent annual wellness visit (AWV) in  Medicare patient 01/14/2022   Alcohol dependence, daily use (HCC) 01/14/2022   Annual physical exam 01/14/2022   Hyperlipidemia associated with type 2 diabetes mellitus (HCC) 06/15/2021   Overweight 06/15/2021   Statin intolerance 12/29/2020   Benign neoplasm of sigmoid colon    Mass of right breast 02/17/2016   History of parotitis 01/05/2016   Hypothyroidism 09/29/2015   Hyperlipemia 09/29/2015   Newly recognized murmur 09/29/2015   Type 2 diabetes mellitus with other diabetic kidney complication (HCC) 08/27/2015   Avitaminosis D 08/27/2015   Compulsive tobacco user syndrome 08/27/2015   Hypertension associated with diabetes (HCC) 07/01/2015     Past Medical History:  Diagnosis Date   Breast cancer (HCC) 06/08/2015   T1c, N0;  ER+,PR+, Her 2 neg (FISH neg) x2., SLN x 3 negative. Mammoprint: Low risk. Multifocal.Invasive mammary and lobular carcinoma.    Diabetes mellitus without complication (HCC)    GERD (gastroesophageal reflux disease)    Heart murmur 2016   newly diagnosed, slight murmur   Hyperlipidemia    Hypertension    Hypothyroidism    Lupus    PONV (postoperative nausea and vomiting)    with Hysterectomy   Port-A-Cath in place    Thyroid disease      Past Surgical History:  Procedure Laterality Date   ABDOMINAL HYSTERECTOMY     BREAST BIOPSY Right 2002   negative/ Dr. Lemar Livings   BREAST BIOPSY Left 06-08-15   INVASIVE MAMMARY CARCINOMA WITH PARTIAL SOLID PATTERN.    BREAST BIOPSY Right 06/26/2018   FIBROADENOMATOUS CHANGES WITH STROMAL HYALINIZATION AND CALCIFICATIONS   COLONOSCOPY WITH PROPOFOL N/A 09/13/2016  Procedure: COLONOSCOPY WITH PROPOFOL;  Surgeon: Midge Minium, MD;  Location: [facility] ENDOSCOPY;  Service: Endoscopy;  Laterality: N/A;   core biopsy Left 06/08/15   GUM SURGERY  2006   IR IMAGING GUIDED PORT INSERTION  02/28/2024   MASTECTOMY Left 2016   OOPHORECTOMY     SENTINEL NODE BIOPSY Left 08/18/2015   Procedure: SENTINEL NODE BIOPSY;  Surgeon:  Earline Mayotte, MD;  Location: [facility] ORS;  Service: General;  Laterality: Left;   SIMPLE MASTECTOMY WITH AXILLARY SENTINEL NODE BIOPSY Left 08/18/2015   Procedure: SIMPLE MASTECTOMY;  Surgeon: Earline Mayotte, MD;  Location: [facility] ORS;  Service: General;  Laterality: Left;   TONSILLECTOMY      Social History   Socioeconomic History   Marital status: Widowed    Spouse name: Not on file   Number of children: 2   Years of education: H/S   Highest education level: 12th grade  Occupational History   Occupation: Part-Time    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 PT states no beer since 02/10/24   Drug use: No   Sexual activity: Not on file  Other Topics Concern   Not on file  Social History Narrative   Not on file   Social Drivers of Health   Financial Resource Strain: Low Risk  (12/26/2023)   Overall Financial Resource Strain (CARDIA)    Difficulty of Paying Living Expenses: Not hard at all  Food Insecurity: No Food Insecurity (03/08/2024)   Hunger Vital Sign    Worried About Running Out of Food in the Last Year: Never true    Ran Out of Food in the Last Year: Never true  Transportation Needs: No Transportation Needs (03/08/2024)   PRAPARE - Administrator, Civil Service (Medical): No    Lack of Transportation (Non-Medical): No  Physical Activity: Insufficiently Active (12/26/2023)   Exercise Vital Sign    Days of Exercise per Week: 4 days    Minutes of Exercise per Session: 30 min  Stress: No  Stress Concern Present (12/26/2023)   [employer] of Occupational Health - Occupational Stress Questionnaire    Feeling of Stress : Not at all  Social Connections: Moderately Isolated (03/08/2024)   Social Connection and Isolation Panel [NHANES]    Frequency of Communication with Friends and Family: More than three times a week    Frequency of Social Gatherings with Friends and Family: More than three times a week    Attends Religious Services: 1 to 4 times per year    Active Member of [employer] or Organizations: No    Attends Banker Meetings: Never    Marital Status: Widowed  Intimate Partner Violence: Not At Risk (03/08/2024)   Humiliation, Afraid, Rape, and Kick questionnaire    Fear of Current or Ex-Partner: No    Emotionally Abused: No    Physically Abused: No    Sexually  Abused: No     Family History  Problem Relation Age of Onset   Cancer Father        prostate   Thyroid disease Daughter    Heart attack Sister    Breast cancer Cousin    Breast cancer Cousin    Breast cancer Other      Current Facility-Administered Medications:    acetaminophen (TYLENOL) tablet 650 mg, 650 mg, Oral, Q6H PRN, Lorretta Harp, MD   Chlorhexidine Gluconate Cloth 2 % PADS 6 each, 6 each, Topical, Daily, Gillis Santa, MD, 6 each at 03/11/24 [phone]   cholecalciferol (VITAMIN D3) 25 MCG (1000 UNIT) tablet 5,000 Units, 5,000 Units, Oral, Daily, Lorretta Harp, MD, 5,000 Units at 03/11/24 0857   feeding supplement (BOOST / RESOURCE BREEZE) liquid 1 Container, 1 Container, Oral, TID BM, Gillis Santa, MD, 1 Container at 03/11/24 1410   fenofibrate tablet 160 mg, 160 mg, Oral, Daily, Lorretta Harp, MD, 160 mg at 03/11/24 0903   fluticasone (FLONASE) 50 MCG/ACT nasal spray 1 spray, 1 spray, Each Nare, BID PRN, Gillis Santa, MD, 1 spray at 03/10/24 2041   folic acid (FOLVITE) tablet 1 mg, 1 mg, Oral, Daily, Gillis Santa, MD, 1 mg at 03/11/24 0857   insulin aspart (novoLOG) injection 0-5 Units, 0-5  Units, Subcutaneous, QHS, Lorretta Harp, MD   insulin aspart (novoLOG) injection 0-9 Units, 0-9 Units, Subcutaneous, TID WC, Lorretta Harp, MD, 2 Units at 03/11/24 1646   insulin glargine (LANTUS) injection 8 Units, 8 Units, Subcutaneous, Daily, Lorretta Harp, MD, 8 Units at 03/11/24 0913   latanoprost (XALATAN) 0.005 % ophthalmic solution 1 drop, 1 drop, Both Eyes, QHS, Gillis Santa, MD, 1 drop at 03/10/24 2114   multivitamin with minerals tablet 1 tablet, 1 tablet, Oral, Daily, Gillis Santa, MD, 1 tablet at 03/11/24 1220   nicotine (NICODERM CQ - dosed in mg/24 hours) patch 21 mg, 21 mg, Transdermal, Daily, Lorretta Harp, MD, 21 mg at 03/11/24 0915   ondansetron (ZOFRAN) injection 4 mg, 4 mg, Intravenous, Q8H PRN, Lorretta Harp, MD   oxyCODONE (Oxy IR/ROXICODONE) immediate release tablet 5 mg, 5 mg, Oral, Q4H PRN, Lorretta Harp, MD   phosphorus (K PHOS NEUTRAL) tablet 500 mg, 500 mg, Oral, TID, Gillis Santa, MD, 500 mg at 03/11/24 1646   piperacillin-tazobactam (ZOSYN) IVPB 3.375 g, 3.375 g, Intravenous, Q8H, [name], RPH, Last Rate: 12.5 mL/hr at 03/11/24 1410, 3.375 g at 03/11/24 1410   saccharomyces boulardii (FLORASTOR) capsule 250 mg, 250 mg, Oral, BID, Gillis Santa, MD, 250 mg at 03/11/24 8469   sodium chloride tablet 2 g, 2 g, Oral, TID WC, Gillis Santa, MD, 2 g at 03/11/24 1221   thiamine (VITAMIN B1) tablet 100 mg, 100 mg, Oral, Daily, Gillis Santa, MD, 100 mg at 03/11/24 0857   vancomycin (VANCOREADY) IVPB 1250 mg/250 mL, 1,250 mg, Intravenous, Q24H, Angelique Blonder, RPH, Last Rate: 166.7 mL/hr at 03/11/24 1334, 1,250 mg at 03/11/24 1334   Physical exam:  Vitals:   03/10/24 2207 03/11/24 0500 03/11/24 0746 03/11/24 1650  BP:   130/79 (!) 144/70  Pulse:   84 (!) 102  Resp:   15   Temp: 99.3 F (37.4 C)  98.2 F (36.8 C) 99.9 F (37.7 C)  TempSrc: Oral  Oral   SpO2:   90% 96%  Weight:  114 lb 6.7 oz (51.9 kg)    Height:       Physical Exam        Latest Ref Rng &  Units  03/11/2024    6:58 AM  CMP  Glucose 70 - 99 mg/dL 409   BUN 8 - 23 mg/dL 15   Creatinine 8.11 - 1.00 mg/dL 9.14   Sodium 782 - 956 mmol/L 127   Potassium 3.5 - 5.1 mmol/L 3.4   Chloride 98 - 111 mmol/L 99   CO2 22 - 32 mmol/L 22   Calcium 8.9 - 10.3 mg/dL 6.9       Latest Ref Rng & Units 03/11/2024    6:58 AM  CBC  WBC 4.0 - 10.5 K/uL 7.9   Hemoglobin 12.0 - 15.0 g/dL 8.9   Hematocrit 21.3 - 46.0 % 24.8   Platelets 150 - 400 K/uL 56     @IMAGES @  DG Chest 2 View Result Date: 03/08/2024 CLINICAL DATA:  Fever.  Sepsis. EXAM: CHEST - 2 VIEW COMPARISON:  Yesterday FINDINGS: Apical lordotic frontal radiograph. AP lateral views. Right Port-A-Cath tip superior caval/atrial junction. Midline trachea. Borderline cardiomegaly. Atherosclerosis in the transverse aorta. Tiny bilateral pleural effusions are similar. No pneumothorax. No congestive failure. Similar subsegmental atelectasis at both lung bases. IMPRESSION: No significant change since one day prior. Tiny bilateral pleural effusions with subsegmental atelectasis at both lung bases. Aortic Atherosclerosis (ICD10-I70.0). Electronically Signed   By: Jeronimo Greaves M.D.   On: 03/08/2024 18:32   DG Chest 2 View Result Date: 03/07/2024 CLINICAL DATA:  Cough, fever. EXAM: CHEST - 2 VIEW COMPARISON:  None Available. FINDINGS: The heart size and mediastinal contours are within normal limits. Right internal jugular Port-A-Cath is in grossly good position. Minimal bibasilar subsegmental atelectasis is noted with small pleural effusions. The visualized skeletal structures are unremarkable. IMPRESSION: Minimal bibasilar subsegmental atelectasis is noted with small pleural effusions. Electronically Signed   By: Lupita Raider M.D.   On: 03/07/2024 16:42   IR IMAGING GUIDED PORT INSERTION Result Date: 02/28/2024 INDICATION: 73 year old female with pancreatic cancer in need of durable venous access to allow for chemotherapy. She presents for port catheter  placement. EXAM: IMPLANTED PORT A CATH PLACEMENT WITH ULTRASOUND AND FLUOROSCOPIC GUIDANCE MEDICATIONS: None. ANESTHESIA/SEDATION: Versed 1 mg IV; Fentanyl 50 mcg IV; administered by the radiology nurse Moderate Sedation Time:  10 minutes The patient's vital signs and level of consciousness were monitored during the procedure by the interventional radiology nurse under my direct supervision. FLUOROSCOPY: Radiation exposure index: 2 mGy, reference air kerma COMPLICATIONS: None immediate. PROCEDURE: The right neck and chest was prepped with chlorhexidine, and draped in the usual sterile fashion using maximum barrier technique (cap and mask, sterile gown, sterile gloves, large sterile sheet, hand hygiene and cutaneous antiseptic). Local anesthesia was attained by infiltration with 1% lidocaine with epinephrine. Ultrasound demonstrated patency of the right internal jugular vein, and this was documented with an image. Under real-time ultrasound guidance, this vein was accessed with a 21 gauge micropuncture needle and image documentation was performed. A small dermatotomy was made at the access site with an 11 scalpel. A 0.018" wire was advanced into the SVC and the access needle exchanged for a 25F micropuncture vascular sheath. The 0.018" wire was then removed and a 0.035" wire advanced into the IVC. An appropriate location for the subcutaneous reservoir was selected below the clavicle and an incision was made through the skin and underlying soft tissues. The subcutaneous tissues were then dissected using a combination of blunt and sharp surgical technique and a pocket was formed. A Bard Clear Vue single lumen power injectable portacatheter was then tunneled through the subcutaneous tissues  from the pocket to the dermatotomy and the port reservoir placed within the subcutaneous pocket. The venous access site was then serially dilated and a peel away vascular sheath placed over the wire. The wire was removed and the port  catheter advanced into position under fluoroscopic guidance. The catheter tip is positioned in the superior cavoatrial junction. This was documented with a spot image. The portacatheter was then tested and found to flush and aspirate well. The port was flushed with saline followed by 100 units/mL heparinized saline. The pocket was then closed in two layers using first subdermal inverted interrupted absorbable sutures followed by a running subcuticular suture. The epidermis was then sealed with Dermabond. The dermatotomy at the venous access site was also closed with Dermabond. IMPRESSION: Successful placement of a right IJ approach Bard Clear Vue port catheter with ultrasound and fluoroscopic guidance. The catheter is ready for use. Electronically Signed   By: Malachy Moan M.D.   On: 02/28/2024 15:28   NM PET Image Initial (PI) Skull Base To Thigh Result Date: 02/23/2024 CLINICAL DATA:  Initial treatment strategy for pancreatic carcinoma. EXAM: NUCLEAR MEDICINE PET SKULL BASE TO THIGH TECHNIQUE: 7.1 mCi F-18 FDG was injected intravenously. Full-ring PET imaging was performed from the skull base to thigh after the radiotracer. CT data was obtained and used for attenuation correction and anatomic localization. Fasting blood glucose: 75 mg/dl COMPARISON:  CT on 16/09/9603 FINDINGS: Mediastinal blood-pool activity (background): SUV max = 1.9 Liver activity (reference): SUV max = N/A NECK:  No hypermetabolic lymph nodes or masses. Incidental CT findings:  None. CHEST: 5 mm left internal mammary chain lymph node is hypermetabolic, with SUV max of 4.3. 5 mm precardiac mediastinal lymph node is also hypermetabolic, with SUV max of 4.0. No suspicious pulmonary nodules seen on CT images. Incidental CT findings:  None. ABDOMEN/PELVIS: 3.8 x 3.6 cm mass in the pancreatic head shows intense hypermetabolism, with SUV max of 15.4, consistent with primary pancreatic carcinoma. Hypermetabolic lymphadenopathy is seen in the  adjacent peripancreatic retroperitoneum, gastrohepatic ligament, porta hepatis, and portacaval space, with 1 index lymph node in the gastrohepatic ligament measuring 2.1 cm with SUV max of 14.2. Numerous hypermetabolic metastases are seen throughout liver, largest occupying nearly the entire lateral segment of the left hepatic lobe this has SUV max of 14.2. A solid mass is seen in the anterior midpole of the right kidney measuring 4.7 x 4.4 cm. This shows mild FDG uptake compared to other disease, with SUV max of 2.9. This favors primary renal cell carcinoma over renal metastasis. No hypermetabolic masses or lymphadenopathy identified within pelvis. Incidental CT findings: Prior hysterectomy. Small amount of free fluid in pelvis. Colonic diverticulosis, without signs of diverticulitis. SKELETON: No focal hypermetabolic bone lesions to suggest skeletal metastasis. Incidental CT findings:  None. IMPRESSION: 3.8 cm hypermetabolic mass in pancreatic head, consistent with primary pancreatic carcinoma. Hypermetabolic abdominal lymphadenopathy, consistent with metastatic disease. Diffuse hypermetabolic liver metastases. 5 mm hypermetabolic left internal mammary and precardiac lymph nodes, consistent with metastatic disease. 4.7 cm solid mass in anterior midpole of right kidney, which shows mild FDG uptake. This favors primary renal cell carcinoma over renal metastasis. Electronically Signed   By: Danae Orleans M.D.   On: 02/23/2024 08:35   US BIOPSY (LIVER) Result Date: 02/20/2024 INDICATION: Multiple liver lesions, mass of the head of the pancreas, right renal mass and prior history of breast carcinoma. Clinical suspicion of metastatic pancreatic carcinoma based on recent imaging. Presenting for liver lesion biopsy. EXAM: ULTRASOUND GUIDED  CORE BIOPSY OF LIVER MASS MEDICATIONS: None. ANESTHESIA/SEDATION: Moderate (conscious) sedation was employed during this procedure. A total of Versed 1.0 mg and Fentanyl 50 mcg was  administered intravenously. Moderate Sedation Time: 13 minutes. The patient's level of consciousness and vital signs were monitored continuously by radiology nursing throughout the procedure under my direct supervision. PROCEDURE: The procedure, risks, benefits, and alternatives were explained to the patient. Questions regarding the procedure were encouraged and answered. The patient understands and consents to the procedure. A time-out was performed prior to initiating the procedure. The abdominal wall was prepped with chlorhexidine in a sterile fashion, and a sterile drape was applied covering the operative field. A sterile gown and sterile gloves were used for the procedure. Local anesthesia was provided with 1% Lidocaine. Ultrasound was used to localize a lesion within the left lobe of the liver. Under ultrasound guidance, a 17 gauge trocar needle was advanced into the lesion. Three separate coaxial 18 gauge core biopsy samples were obtained and submitted in formalin. A slurry of Gel-Foam EmboCubes was then injected as the trocar needle was retracted and removed. Additional ultrasound imaging was performed. COMPLICATIONS: None immediate. FINDINGS: Confluent tumor in the left lobe of the liver was localized measuring nearly 9 cm in maximum diameter. Solid tissue was obtained. IMPRESSION: Ultrasound-guided core biopsy performed of tumor within the left lobe of the liver. Electronically Signed   By: Irish Lack M.D.   On: 02/20/2024 10:00   CT ABDOMEN PELVIS W CONTRAST Result Date: 02/10/2024 CLINICAL DATA:  Left lower quadrant abdominal pain EXAM: CT ABDOMEN AND PELVIS WITH CONTRAST TECHNIQUE: Multidetector CT imaging of the abdomen and pelvis was performed using the standard protocol following bolus administration of intravenous contrast. RADIATION DOSE REDUCTION: This exam was performed according to the departmental dose-optimization program which includes automated exposure control, adjustment of the mA  and/or kV according to patient size and/or use of iterative reconstruction technique. CONTRAST:  75mL OMNIPAQUE IOHEXOL 350 MG/ML SOLN COMPARISON:  None Available. FINDINGS: Lower chest: No acute pleural or parenchymal lung disease. Dense calcification of the mitral valve. Hepatobiliary: There are multiple hypodense lesions within the liver, concerning for metastatic disease. The 2 largest lesions are seen within the left lobe, measuring 4.0 x 3.7 cm and 3.5 x 2.9 cm, reference images 12 and 13 of series 2. Gallbladder is decompressed, with nonspecific gallbladder wall thickening. No pericholecystic fluid or calcified gallstones. No biliary duct dilation. Pancreas: There is a 3.6 x 3.6 cm mass centered within the uncinate process of the pancreas, reference image 24/2, consistent with malignancy. There is upstream pancreatic atrophy and irregular pancreatic duct dilation. No acute inflammatory changes. Spleen: Normal in size without focal abnormality. Adrenals/Urinary Tract: The adrenals are unremarkable. There is a 5.1 x 4.6 x 5.3 cm heterogeneously enhancing mass within the anterior aspect of the right kidney, compatible with renal cell carcinoma. No evidence of extension into the renal pelvis or vascular involvement. Simple cyst upper pole left kidney does not require follow-up. Otherwise the left kidney is unremarkable. The bladder is moderately distended, without filling defect. Stomach/Bowel: No bowel obstruction or ileus. Normal appendix right lower quadrant. No bowel wall thickening or inflammatory change. Vascular/Lymphatic: 3.1 cm infrarenal abdominal aortic aneurysm. Diffuse aortic atherosclerosis. Multiple enlarged lymph nodes are seen within the porta hepatis, consistent with nodal metastases. Largest lymph node reference image 18/2 measures 14 mm in short axis. Reproductive: Prior hysterectomy.  No adnexal masses. Other: Trace pelvic free fluid. No free intraperitoneal gas. No abdominal wall hernia.  Musculoskeletal: There are no acute or destructive bony abnormalities. Reconstructed images demonstrate no additional findings. IMPRESSION: 1. 3.6 cm mass within the uncinate process of the pancreas, consistent with malignancy. Upstream pancreatic atrophy and irregular pancreatic duct dilation. Further evaluation with EUS may be useful. 2. Multiple hypodense masses within the liver, largest in the left lobe, consistent with hepatic metastases. 3. 5.3 cm heterogeneously enhancing right renal mass, consistent with renal cell carcinoma. 4. Pathologic adenopathy within the porta hepatis, consistent with nodal metastases. 5. Trace pelvic free fluid. 6. 3.1 cm infrarenal abdominal aortic aneurysm. Recommend follow-up ultrasound every 3 years. (Ref.: J Vasc Surg. 2018; 67:2-77 and J Am Coll Radiol 2013;10(10):789-794.) 7.  Aortic Atherosclerosis (ICD10-I70.0). Electronically Signed   By: Sharlet Salina M.D.   On: 02/10/2024 17:48    Assessment and plan- Patient is a 73 y.o. female ***   Thank you for this kind referral and the opportunity to participate in the care of this  Patient   Visit Diagnosis 1. Sepsis without acute organ dysfunction, due to unspecified organism (HCC)   2. Malignant neoplasm of pancreas, unspecified location of malignancy [facility])     Dr. Owens Shark, MD, MPH [facility] [phone] 03/11/2024

## 2024-03-12 DIAGNOSIS — R6510 Systemic inflammatory response syndrome (SIRS) of non-infectious origin without acute organ dysfunction: Secondary | ICD-10-CM | POA: Diagnosis not present

## 2024-03-12 LAB — CBC WITH DIFFERENTIAL/PLATELET
Abs Immature Granulocytes: 0.06 10*3/uL (ref 0.00–0.07)
Basophils Absolute: 0 10*3/uL (ref 0.0–0.1)
Basophils Relative: 0 %
Eosinophils Absolute: 0 10*3/uL (ref 0.0–0.5)
Eosinophils Relative: 0 %
HCT: 24.1 % — ABNORMAL LOW (ref 36.0–46.0)
Hemoglobin: 8.5 g/dL — ABNORMAL LOW (ref 12.0–15.0)
Immature Granulocytes: 1 %
Lymphocytes Relative: 13 %
Lymphs Abs: 0.6 10*3/uL — ABNORMAL LOW (ref 0.7–4.0)
MCH: 29.1 pg (ref 26.0–34.0)
MCHC: 35.3 g/dL (ref 30.0–36.0)
MCV: 82.5 fL (ref 80.0–100.0)
Monocytes Absolute: 0.1 10*3/uL (ref 0.1–1.0)
Monocytes Relative: 2 %
Neutro Abs: 3.8 10*3/uL (ref 1.7–7.7)
Neutrophils Relative %: 84 %
Platelets: 48 10*3/uL — ABNORMAL LOW (ref 150–400)
RBC: 2.92 MIL/uL — ABNORMAL LOW (ref 3.87–5.11)
RDW: 14.2 % (ref 11.5–15.5)
Smear Review: NORMAL
WBC: 4.5 10*3/uL (ref 4.0–10.5)
nRBC: 0 % (ref 0.0–0.2)

## 2024-03-12 LAB — GLUCOSE, CAPILLARY
Glucose-Capillary: 97 mg/dL (ref 70–99)
Glucose-Capillary: 170 mg/dL — ABNORMAL HIGH (ref 70–99)
Glucose-Capillary: 114 mg/dL — ABNORMAL HIGH (ref 70–99)
Glucose-Capillary: 93 mg/dL (ref 70–99)

## 2024-03-12 LAB — GASTROINTESTINAL PANEL BY PCR, STOOL (REPLACES STOOL CULTURE)

## 2024-03-12 LAB — CBC
HCT: 23.8 % — ABNORMAL LOW (ref 36.0–46.0)
Hemoglobin: 8.5 g/dL — ABNORMAL LOW (ref 12.0–15.0)
MCH: 29.3 pg (ref 26.0–34.0)
MCHC: 35.7 g/dL (ref 30.0–36.0)
MCV: 82.1 fL (ref 80.0–100.0)
Platelets: 47 10*3/uL — ABNORMAL LOW (ref 150–400)
RBC: 2.9 MIL/uL — ABNORMAL LOW (ref 3.87–5.11)
RDW: 14.2 % (ref 11.5–15.5)
WBC: 4.4 10*3/uL (ref 4.0–10.5)
nRBC: 0 % (ref 0.0–0.2)

## 2024-03-12 LAB — BASIC METABOLIC PANEL
Anion gap: 8 (ref 5–15)
BUN: 11 mg/dL (ref 8–23)
CO2: 22 mmol/L (ref 22–32)
Calcium: 7 mg/dL — ABNORMAL LOW (ref 8.9–10.3)
Chloride: 100 mmol/L (ref 98–111)
Creatinine, Ser: 0.38 mg/dL — ABNORMAL LOW (ref 0.44–1.00)
GFR, Estimated: >60 mL/min (ref >60)
Glucose, Bld: 96 mg/dL (ref 70–99)
Potassium: 3.1 mmol/L — ABNORMAL LOW (ref 3.5–5.1)
Sodium: 130 mmol/L — ABNORMAL LOW (ref 135–145)

## 2024-03-12 LAB — VANCOMYCIN, PEAK: Vancomycin Pk: 19 ug/mL — ABNORMAL LOW (ref 30–40)

## 2024-03-12 LAB — PHOSPHORUS: Phosphorus: 2.2 mg/dL — ABNORMAL LOW (ref 2.5–4.6)

## 2024-03-12 LAB — MAGNESIUM: Magnesium: 1.8 mg/dL (ref 1.7–2.4)

## 2024-03-12 LAB — PROCALCITONIN: Procalcitonin: 1.47 ng/mL

## 2024-03-12 MED ORDER — POTASSIUM CHLORIDE CRYS ER 20 MEQ PO TBCR
40.0000 meq | EXTENDED_RELEASE_TABLET | ORAL | Status: AC
  Administered 2024-03-12 (×2): 40 meq via ORAL
  Filled 2024-03-12 (×2): qty 2

## 2024-03-12 MED ORDER — K PHOS MONO-SOD PHOS DI & MONO 155-852-130 MG PO TABS
500.0000 mg | ORAL_TABLET | Freq: Three times a day (TID) | ORAL | Status: AC
  Administered 2024-03-12 (×3): 500 mg via ORAL
  Filled 2024-03-12 (×3): qty 2

## 2024-03-12 MED ORDER — GLUCERNA SHAKE PO LIQD: 237.0000 mL | Freq: Three times a day (TID) | ORAL | Status: DC

## 2024-03-12 MED ORDER — VANCOMYCIN HCL 1250 MG/250ML IV SOLN
1250.0000 mg | INTRAVENOUS | Status: DC
  Administered 2024-03-12: 1250 mg via INTRAVENOUS
  Filled 2024-03-12 (×2): qty 250

## 2024-03-12 MED ORDER — PIPERACILLIN-TAZOBACTAM 3.375 G IVPB
3.3750 g | Freq: Three times a day (TID) | INTRAVENOUS | Status: DC
  Administered 2024-03-12 – 2024-03-15 (×10): 3.375 g via INTRAVENOUS
  Filled 2024-03-12 (×10): qty 50

## 2024-03-12 NOTE — Progress Notes (Signed)
   03/12/24 0823  Assess: MEWS Score  Temp (!) 101.4 F (38.6 C)  BP (!) 152/58  MAP (mmHg) 82  Pulse Rate (!) 104  Resp 18  Level of Consciousness Alert  SpO2 91 %  O2 Device Room Air  Assess: MEWS Score  MEWS Temp 1  MEWS Systolic 0  MEWS Pulse 1  MEWS RR 0  MEWS LOC 0  MEWS Score 2  MEWS Score Color Yellow  Assess: if the MEWS score is Yellow or Red  Were vital signs accurate and taken at a resting state? Yes  Does the patient meet 2 or more of the SIRS criteria? Yes  Does the patient have a confirmed or suspected source of infection? No  MEWS guidelines implemented  Yes, yellow  Treat  MEWS Interventions Considered administering scheduled or prn medications/treatments as ordered  Take Vital Signs  Increase Vital Sign Frequency  Yellow: Q2hr x1, continue Q4hrs until patient remains green for 12hrs  Escalate  MEWS: Escalate Yellow: Discuss with charge nurse and consider notifying provider and/or RRT  Provider Notification  Provider Name/Title Gillis Santa, MD  Date Provider Notified 03/12/24  Time Provider Notified 0825  Method of Notification Call  Notification Reason Other (Comment) (MEWS yellow)  Provider response See new orders  Date of Provider Response 03/12/24  Time of Provider Response 0826  Assess: SIRS CRITERIA  SIRS Temperature  1  SIRS Respirations  0  SIRS Pulse 1  SIRS WBC 0  SIRS Score Sum  2

## 2024-03-12 NOTE — Progress Notes (Signed)
 Pharmacy Antibiotic Note  Heather Burke is a 73 y.o. female w/ PMH of HTN, HLD, DM, EtOH abuse, pancreatic cancer admitted on 03/08/2024 with SIRS vs. sepsis with unlcear source.  Pharmacy has been consulted for vancomycin and Zosyn dosing.  Today, 03/12/24: Lactic acid wnl on admission Pro cal 3.5 on admission WBC 23.7>>>>7.9>4.4 Afebrile, then spiked a fever this morning of 101.4 CXR negative  UA: trace amounts of leukocytes with WBC 6-10; no UTI symptoms; Ucx NG final  - s/p cefepime and flagyl in the ED - 3/21 Vanc + Zosyn>>3/25  Bcx NG x 3 days Respiratory panel negative  Negative for C. diff  Plan:  1) Continue Zosyn 3.375g IV q8h (4 hour infusion).  2) Continue 1250 mg IV every 24 hours Goal AUC 400-550. Expected AUC: 540.3 SCr used: 0.80 mg/dL (rounded up) Will go ahead and order vancomycin peak and trough to make sure dose is therapeutic given patient has been on therapy ~5 days and is still spiking a fever  Height: 5\' 3"  (160 cm) Weight: 50.8 kg (111 lb 15.9 oz) IBW/kg (Calculated) : 52.4  Temp (24hrs), Avg:99.8 F (37.7 C), Min:97.9 F (36.6 C), Max:101.4 F (38.6 C)  Recent Labs  Lab 03/08/24 1457 03/09/24 0116 03/09/24 0433 03/09/24 0945 03/09/24 1115 03/09/24 2110 03/10/24 0507 03/11/24 0658 03/12/24 0555  WBC  --   --  15.9*  --  18.3*  --  9.9 7.9 4.4  CREATININE  --    < >  --    < > 0.61 0.55 0.55 0.53 0.38*  LATICACIDVEN 1.6  --   --   --   --   --   --   --   --    < > = values in this interval not displayed.    Estimated Creatinine Clearance: 51.7 mL/min (A) (by C-G formula based on SCr of 0.38 mg/dL (L)).    Allergies  Allergen Reactions   Metformin And Related Diarrhea    SEVERE DIARRHEA   Amlodipine Nausea Only and Other (See Comments)    Stomach cramping   Atorvastatin Nausea And Vomiting   Lisinopril Cough   Pravastatin Nausea And Vomiting   Icosapent Ethyl (Epa Ethyl Ester) (Fish) Palpitations   Nifedipine Er Other (See  Comments)    headache    Antimicrobials this admission: 03/21 cefepime, metronidazole x 1 03/21 vancomycin >>  03/21 Zosyn >>   Microbiology results: 03/20 BCx: NG x 3 days 03/20 UCx: NG final   Thank you for allowing pharmacy to be a part of this patient's care.  Merryl Hacker, PharmD Clinical Pharmacist  03/12/2024 8:35 AM

## 2024-03-12 NOTE — Progress Notes (Signed)
 At bedside to perform port needle change per IV team request.  Per chart it appears that port was deaccessed on 3/20.  Spoke with patient and her two daughters who state that patient had chemo on 3/20 and port was deaccessed at that time.  Patient returned to cancer center on 3/21 and was then sent to ED with port being reaccessed at that time.  Dressing shows 3/24 which patient/family states was done due to blood being under dressing.  Dressing and biopatch are clean dry and intact.  Port needle change is due 3/28.  Chart updated to reflect this information.  Bedside RN and charge nurse made aware.

## 2024-03-12 NOTE — Progress Notes (Signed)
 [facility] Progress Note  Patient: Heather Burke    ZHY:[id number]  DOA: 03/08/2024     Date of Service: the patient was seen and examined on 03/12/2024  Chief Complaint  Patient presents with   Fever   Brief hospital course: Heather Burke is a 73 y.o. female with medical history significant of recently diagnosed invasive stage IV pancreatic cancer on chemotherapy, breast cancer 2016, HTN, HLD, DM, dCHF, hypothyroidism, depression, who presents with intermittent fever.   Patient states that she has intermittent fever for almost 3 weeks.  Last night she had fever 102.0 with chills.  No chest pain, cough, SOB.  Patient does not have nausea vomiting or abdominal pain.  Patient has intermittent diarrhea and is taking Imodium as needed.  No symptoms of UTI.  Patient has been taking Levaquin prescribed by Dr. Phylliss Bob of oncology since 3/18 without improvement.  She had first dose of chemotherapy on 3/18 per patient.  She has generalized weakness, poor appetite and decreased oral intake. Pt was seen in cancer center and found to have hypothermia, and had blood culture drawn,  and was sent to ED for further evaluation and treatment.  Patient states that she stopped drinking alcohol since February.     Data reviewed independently and ED Course: pt was found to have WBC 23.7, sodium 120, GFR > 60, potassium 4.4, magnesium 1.8, phosphorus 3.0, urinalysis (clear appearance, trace amount of leukocyte, negative bacteria, WBC 6-10), negative PCR for COVID, flu and RSV, lactic acid 1.6, procalcitonin 3.50.  Temperature 100.2 --> 94.2, initial blood pressure 71/30 which improved to 119/53, heart rate 56, 20, oxygen saturation 96-99% on room air.  Chest x-ray negative for infiltration.  Patient is admitted to telemetry bed as inpatient.     EKG: I have personally reviewed.  Sinus rhythm, QTc 423, low voltage.   Assessment and Plan:  # SIRS (systemic inflammatory response syndrome) [facility]): Patient meets  criteria for SIRS with WBC 23.7 and hypothermia with temperature 94.2.  Initially hypotensive, which improved to 119/53 before giving IVF.   No clear source of infection identified so far, will treat patient as SIRS.  If source of infection is identified, will change to sepsis.   Lactic acid is normal.  Procalcitonin 3.50.  Chest x-ray negative for infiltration.  UA showed trace amount of leukocyte with WBC 6-10, but patient denies symptoms of UTI. -Continue vancomycin plus Zosyn (patient received 1 dose of cefepime and Flagyl in ED) -f/u Blood culture -IV fluid: 500 cc normal saline, s/p NS 75 cc/h -s/p 100 mg of solucortef as stress dose 3/25 patient spiked fever 101.4, WBC 4.4 and procalcitonin 3.5-->1.47 trended down.  Patient stated that she was wrapped in warm blankets that may be the reason of fever, does not want blood culture to be drawn. Continued antibiotics for now    # Malignant neoplasm of other parts of pancreas: Patient had 1 dose of chemotherapy -Follow-up with Dr. Smith Robert, oncologist   # Hypotonic hyponatremia: Sodium 120, likely multifactorial etiology, including poor oral intake, Diovan/hydrochlorothiazide and  dehydration.  Mental status normal. Serum osmolality 270, low Continue fluid restriction S/p NS bolus and maintenance, no more IV fluid Started Salt Tabs 2 g p.o. 3 times daily Monitor sodium level - avoid over correction too fast due to risk of central pontine myelinolysis -Hold Diovan-HCTZ 3/25 Na 130 today    # Hypokalemia due to diarrhea, potassium repleted. # Hypophosphatemia, Phos repleted. Monitor electrolytes and replete as needed.   #  Hypertension associated with diabetes -Hold blood pressure medications (diltiazem, metoprolol, Diovan-HCTZ) due to softer blood pressure   # Hypothyroidism: Synthroid # Hyperlipemia: Tricor # Chronic diastolic CHF (congestive heart failure): 2D echo on 10/09/2015 showed EF of 55 to 60% with grade 1 diastolic dysfunction.   Patient has 1+ leg edema, no shortness of breath.   BNP is 117, does not seem to have CHF exacerbation.   Will hold off diuretics due to SIRS. -Watch volume status closely   # Diarrhea: Negative C. Difficile Diarrhea improving, started probiotics. 3/25 follow GI pathogen, still patient is having diarrhea few episodes on a daily  # Metabolic acidosis: most likely due to diarrhea, resolved s/p bicarbonate oral supplement.   # Alcohol dependence, daily use  -Patient stopped drinking alcohol since February   # Diabetes mellitus without complication: Recent A1c 6.9, well-controlled.  Patient is taking Lantus 16 units daily -Sliding scale insulin   # Depression -Continue home medications   # Protein-calorie malnutrition, moderate: Body weight 49.1 kg, BMI 19.17 -Ensure -Nutrition consult     Body mass index is 19.84 kg/m.  Interventions:  Diet: Diabetic diet DVT Prophylaxis: Subcutaneous Lovenox   Advance goals of care discussion: Full code  Family Communication: family was present at bedside, at the time of interview.  The pt provided permission to discuss medical plan with the family. Opportunity was given to ask question and all questions were answered satisfactorily.   Disposition:  Pt is from home, admitted with SIRS, diarrhea, hyponatremia, still has low sodium on IV antibiotics, which precludes a safe discharge. Discharge to home, when stable, most likely in 2-3 days when sodium level improves   Subjective: No significant events overnight, the morning time patient spiked fever 101.4, she was wrapped in the warm blankets, denied any other complaints, refused blood cultures.  Still having diarrhea 3 episodes daily.  She had 2 episode in the morning.  Denies any abdominal pain, no nausea vomiting. Denied any chest pain or palpitations, no shortness of breath.   Physical Exam: General: NAD, lying comfortably Appear in no distress, affect appropriate Eyes: PERRLA ENT:  Oral Mucosa Clear, moist  Neck: no JVD,  Cardiovascular: S1 and S2 Present, no Murmur,  Respiratory: good respiratory effort, Bilateral Air entry equal and Decreased, no Crackles, no wheezes Abdomen: Bowel Sound present, Soft and no tenderness,  Skin: no rashes Extremities: no Pedal edema, no calf tenderness Neurologic: without any new focal findings Gait not checked due to patient safety concerns  Vitals:   03/12/24 0500 03/12/24 0823 03/12/24 0940 03/12/24 1308  BP:  (!) 152/58  119/70  Pulse:  (!) 104  83  Resp:  18  14  Temp:  (!) 101.4 F (38.6 C) 98.1 F (36.7 C) 97.6 F (36.4 C)  TempSrc:      SpO2:  91%  100%  Weight: 50.8 kg     Height:        Intake/Output Summary (Last 24 hours) at 03/12/2024 1319 Last data filed at 03/12/2024 1610 Gross per 24 hour  Intake 354.14 ml  Output --  Net 354.14 ml   Filed Weights   03/10/24 0500 03/11/24 0500 03/12/24 0500  Weight: 58.8 kg 51.9 kg 50.8 kg    Data Reviewed: I have personally reviewed and interpreted daily labs, tele strips, imagings as discussed above. I reviewed all nursing notes, pharmacy notes, vitals, pertinent old records I have discussed plan of care as described above with RN and patient/family.  CBC: Recent Labs  Lab 03/07/24 1501 03/08/24 1321 03/09/24 0433 03/09/24 1115 03/10/24 0507 03/11/24 0658 03/12/24 0555  WBC 23.7* 23.7* 15.9* 18.3* 9.9 7.9 4.5  4.4  NEUTROABS 21.6* 21.8*  --   --   --   --  3.8  HGB 11.0* 10.7* 10.2* 11.1* 9.8* 8.9* 8.5*  8.5*  HCT 32.9* 30.7* 28.1* 31.3* 27.7* 24.8* 24.1*  23.8*  MCV 87.0 84.3 81.2 83.7 81.5 83.2 82.5  82.1  PLT 222 155 145* 171 101* 56* 48*  47*   Basic Metabolic Panel: Recent Labs  Lab 03/08/24 1714 03/09/24 0116 03/09/24 1115 03/09/24 2110 03/10/24 0507 03/11/24 0658 03/12/24 0555  NA  --    < > 122* 125* 126* 127* 130*  K  --    < > 4.0 3.8 3.4* 3.4* 3.1*  CL  --    < > 97* 98 98 99 100  CO2  --    < > 18* 21* 21* 22 22  GLUCOSE   --    < > 168* 102* 86 141* 96  BUN  --    < > 30* 25* 22 15 11   CREATININE  --    < > 0.61 0.55 0.55 0.53 0.38*  CALCIUM  --    < > 7.3* 7.2* 7.0* 6.9* 7.0*  MG 1.8  --  1.9  --  1.9 1.8 1.8  PHOS 3.0  --  3.0  --  2.5 2.4* 2.2*   < > = values in this interval not displayed.    Studies: No results found.   Scheduled Meds:  Chlorhexidine Gluconate Cloth  6 each Topical Daily   cholecalciferol  5,000 Units Oral Daily   feeding supplement  1 Container Oral TID BM   feeding supplement (GLUCERNA SHAKE)  237 mL Oral TID BM   fenofibrate  160 mg Oral Daily   folic acid  1 mg Oral Daily   insulin aspart  0-5 Units Subcutaneous QHS   insulin aspart  0-9 Units Subcutaneous TID WC   insulin glargine  8 Units Subcutaneous Daily   latanoprost  1 drop Both Eyes QHS   multivitamin with minerals  1 tablet Oral Daily   nicotine  21 mg Transdermal Daily   phosphorus  500 mg Oral TID   saccharomyces boulardii  250 mg Oral BID   sodium chloride  2 g Oral TID WC   thiamine  100 mg Oral Daily   Continuous Infusions:  piperacillin-tazobactam (ZOSYN)  IV     vancomycin     PRN Meds: acetaminophen, fluticasone, ondansetron (ZOFRAN) IV, oxyCODONE  Time spent: 35 minutes  Author: Gillis Santa. MD [facility] 03/12/2024 1:19 PM  To reach On-call, see care teams to locate the attending and reach out to them via [email or web address]. If 7PM-7AM, please contact night-coverage If you still have difficulty reaching the attending provider, please page the [facility] (Director on Call) for [facility] on amion for assistance.

## 2024-03-12 NOTE — TOC Initial Note (Signed)
 Transition of Care [facility]) - Initial/Assessment Note    Patient Details  Name: Heather Burke MRN: [id number] Date of Birth: 30-Mar-1951  Transition of [facility]) CM/SW Contact:    Margarito Liner, LCSW Phone Number: 03/12/2024, 1:12 PM  Clinical Narrative:  Readmission prevention screen complete. CSW met with patient. Daughter at bedside. CSW introduced role and explained that discharge planning would be discussed. PCP is Charlcie Cradle, FNP. Patient drives herself to appointments or family drives her. [facility] [address] No issues obtaining medications. Patient lives home alone. No home health or DME use prior to admission. No further concerns. CSW will continue to follow patient for support and facilitate return home once stable. Family will transport her home at discharge.                Expected Discharge Plan: Home/Self Care Barriers to Discharge: Continued Medical Work up   Patient Goals and CMS Choice            Expected Discharge Plan and Services     Post Acute Care Choice: NA Living arrangements for the past 2 months: Single Family Home                                      Prior Living Arrangements/Services Living arrangements for the past 2 months: Single Family Home Lives with:: Self Patient language and need for interpreter reviewed:: Yes Do you feel safe going back to the place where you live?: Yes      Need for Family Participation in Patient Care: Yes (Comment) Care giver support system in place?: Yes (comment)   Criminal Activity/Legal Involvement Pertinent to Current Situation/Hospitalization: No - Comment as needed  Activities of Daily Living   ADL Screening (condition at time of admission) Independently performs ADLs?: Yes (appropriate for developmental age) Is the patient deaf or have difficulty hearing?: No Does the patient have difficulty seeing, even when wearing glasses/contacts?: Yes Does the patient have difficulty  concentrating, remembering, or making decisions?: No  Permission Sought/Granted Permission sought to share information with : Family Supports          Permission granted to share info w Relationship: Daughter at bedside     Emotional Assessment Appearance:: Appears stated age Attitude/Demeanor/Rapport: Engaged, Gracious Affect (typically observed): Accepting, Appropriate, Calm, Pleasant Orientation: : Oriented to Self, Oriented to Place, Oriented to  Time, Oriented to Situation Alcohol / Substance Use: Not Applicable Psych Involvement: No (comment)  Admission diagnosis:  SIRS (systemic inflammatory response syndrome) (HCC) [R65.10] Malignant neoplasm of pancreas, unspecified location of malignancy (HCC) [C25.9] Sepsis without acute organ dysfunction, due to unspecified organism [facility]) [A41.9] Patient Active Problem List   Diagnosis Date Noted   Protein-calorie malnutrition, moderate (HCC) 03/09/2024   Diarrhea 03/09/2024   SIRS (systemic inflammatory response syndrome) (HCC) 03/08/2024   Diabetes mellitus without complication (HCC) 03/08/2024   Chronic diastolic CHF (congestive heart failure) (HCC) 03/08/2024   Depression 03/08/2024   Hyponatremia 03/08/2024   Malignant neoplasm of other parts of pancreas (HCC) 02/23/2024   Left upper quadrant abdominal pain 02/09/2024   Sweating increase 02/09/2024   Hypokalemia 02/09/2024   Abnormal skin growth 08/09/2023   Sebaceous cyst of skin of breast 08/09/2023   Fatigue 05/09/2023   Eye pressure 07/15/2022   Stress and adjustment reaction 07/14/2022   Sore throat 04/27/2022   Chronic pain of right knee 01/14/2022   Need  for diphtheria-tetanus-pertussis (Tdap) vaccine 01/14/2022   Encounter for subsequent annual wellness visit (AWV) in Medicare patient 01/14/2022   Alcohol dependence, daily use (HCC) 01/14/2022   Annual physical exam 01/14/2022   Hyperlipidemia associated with type 2 diabetes mellitus (HCC) 06/15/2021   Overweight  06/15/2021   Statin intolerance 12/29/2020   Benign neoplasm of sigmoid colon    Mass of right breast 02/17/2016   History of parotitis 01/05/2016   Hypothyroidism 09/29/2015   Hyperlipemia 09/29/2015   Newly recognized murmur 09/29/2015   Type 2 diabetes mellitus with other diabetic kidney complication (HCC) 08/27/2015   Avitaminosis D 08/27/2015   Compulsive tobacco user syndrome 08/27/2015   Hypertension associated with diabetes (HCC) 07/01/2015   PCP:  Sallee Provencal, FNP Pharmacy[facility]   [phone], [place] - [address] [place] [address] Phone: [phone] Fax: [phone]  [facility] - [place], [place] - [address] [place] [address] Phone: [phone] Fax: [phone]     Social Drivers of Health (SDOH) Social History: SDOH Screenings   Food Insecurity: No Food Insecurity (03/08/2024)  Housing: Low Risk  (03/09/2024)  Transportation Needs: No Transportation Needs (03/08/2024)  Utilities: Not At Risk (03/08/2024)  Alcohol Screen: Low Risk  (12/26/2023)  Depression (PHQ2-9): Low Risk  (02/23/2024)  Recent Concern: Depression (PHQ2-9) - High Risk (02/09/2024)  Financial Resource Strain: Low Risk  (12/26/2023)  Physical Activity: Insufficiently Active (12/26/2023)  Social Connections: Moderately Isolated (03/08/2024)  Stress: No Stress Concern Present (12/26/2023)  Tobacco Use: High Risk (03/08/2024)  Health Literacy: Adequate Health Literacy (12/26/2023)   SDOH Interventions:     Readmission Risk Interventions    03/12/2024    1:10 PM  Readmission Risk Prevention Plan  Transportation Screening Complete  PCP or Specialist Appt within 3-5 Days Complete  Social Work Consult for Recovery Care Planning/Counseling Complete  Palliative Care Screening Not Applicable  Medication Review Oceanographer) Complete

## 2024-03-13 ENCOUNTER — Encounter: Payer: Self-pay | Admitting: Oncology

## 2024-03-13 DIAGNOSIS — R6510 Systemic inflammatory response syndrome (SIRS) of non-infectious origin without acute organ dysfunction: Secondary | ICD-10-CM | POA: Diagnosis not present

## 2024-03-13 DIAGNOSIS — D701 Agranulocytosis secondary to cancer chemotherapy: Secondary | ICD-10-CM | POA: Diagnosis not present

## 2024-03-13 DIAGNOSIS — T451X5A Adverse effect of antineoplastic and immunosuppressive drugs, initial encounter: Secondary | ICD-10-CM

## 2024-03-13 LAB — CULTURE, BLOOD (ROUTINE X 2)
Culture: NO GROWTH
Culture: NO GROWTH
Special Requests: ADEQUATE

## 2024-03-13 LAB — BASIC METABOLIC PANEL
Anion gap: 4 — ABNORMAL LOW (ref 5–15)
BUN: 11 mg/dL (ref 8–23)
CO2: 21 mmol/L — ABNORMAL LOW (ref 22–32)
Calcium: 6.9 mg/dL — ABNORMAL LOW (ref 8.9–10.3)
Chloride: 105 mmol/L (ref 98–111)
Creatinine, Ser: 0.5 mg/dL (ref 0.44–1.00)
GFR, Estimated: >60 mL/min (ref >60)
Glucose, Bld: 97 mg/dL (ref 70–99)
Potassium: 3.8 mmol/L (ref 3.5–5.1)
Sodium: 130 mmol/L — ABNORMAL LOW (ref 135–145)

## 2024-03-13 LAB — CBC WITH DIFFERENTIAL/PLATELET
Abs Immature Granulocytes: 0.02 10*3/uL (ref 0.00–0.07)
Basophils Absolute: 0 10*3/uL (ref 0.0–0.1)
Basophils Relative: 0 %
Eosinophils Absolute: 0 10*3/uL (ref 0.0–0.5)
Eosinophils Relative: 0 %
HCT: 22.4 % — ABNORMAL LOW (ref 36.0–46.0)
Hemoglobin: 7.9 g/dL — ABNORMAL LOW (ref 12.0–15.0)
Immature Granulocytes: 1 %
Lymphocytes Relative: 25 %
Lymphs Abs: 0.6 10*3/uL — ABNORMAL LOW (ref 0.7–4.0)
MCH: 29.5 pg (ref 26.0–34.0)
MCHC: 35.3 g/dL (ref 30.0–36.0)
MCV: 83.6 fL (ref 80.0–100.0)
Monocytes Absolute: 0.1 10*3/uL (ref 0.1–1.0)
Monocytes Relative: 5 %
Neutro Abs: 1.6 10*3/uL — ABNORMAL LOW (ref 1.7–7.7)
Neutrophils Relative %: 69 %
Platelets: 57 10*3/uL — ABNORMAL LOW (ref 150–400)
RBC: 2.68 MIL/uL — ABNORMAL LOW (ref 3.87–5.11)
RDW: 14.5 % (ref 11.5–15.5)
Smear Review: NORMAL
WBC Morphology: >10
WBC: 2.4 10*3/uL — ABNORMAL LOW (ref 4.0–10.5)
nRBC: 0 % (ref 0.0–0.2)

## 2024-03-13 LAB — PHOSPHORUS: Phosphorus: 1.7 mg/dL — ABNORMAL LOW (ref 2.5–4.6)

## 2024-03-13 LAB — HEPATIC FUNCTION PANEL
ALT: 12 U/L (ref 0–44)
AST: 33 U/L (ref 15–41)
Albumin: <1.5 g/dL — ABNORMAL LOW (ref 3.5–5.0)
Alkaline Phosphatase: 99 U/L (ref 38–126)
Bilirubin, Direct: 0.7 mg/dL — ABNORMAL HIGH (ref 0.0–0.2)
Indirect Bilirubin: 0.6 mg/dL (ref 0.3–0.9)
Total Bilirubin: 1.3 mg/dL — ABNORMAL HIGH (ref 0.0–1.2)
Total Protein: 4.6 g/dL — ABNORMAL LOW (ref 6.5–8.1)

## 2024-03-13 LAB — VANCOMYCIN, TROUGH: Vancomycin Tr: 6 ug/mL — ABNORMAL LOW (ref 15–20)

## 2024-03-13 LAB — GLUCOSE, CAPILLARY
Glucose-Capillary: 131 mg/dL — ABNORMAL HIGH (ref 70–99)
Glucose-Capillary: 122 mg/dL — ABNORMAL HIGH (ref 70–99)
Glucose-Capillary: 96 mg/dL (ref 70–99)
Glucose-Capillary: 96 mg/dL (ref 70–99)

## 2024-03-13 LAB — MAGNESIUM: Magnesium: 1.8 mg/dL (ref 1.7–2.4)

## 2024-03-13 MED ORDER — INSULIN GLARGINE-YFGN 100 UNIT/ML ~~LOC~~ SOLN
8.0000 [IU] | Freq: Every day | SUBCUTANEOUS | Status: DC
Start: 2024-03-13 — End: 2024-03-16
  Administered 2024-03-13 – 2024-03-16 (×4): 8 [IU] via SUBCUTANEOUS
  Filled 2024-03-13 (×4): qty 0.08

## 2024-03-13 MED ORDER — SODIUM PHOSPHATES 45 MMOLE/15ML IV SOLN
30.0000 mmol | Freq: Once | INTRAVENOUS | Status: AC
  Administered 2024-03-13: 30 mmol via INTRAVENOUS
  Filled 2024-03-13: qty 10

## 2024-03-13 MED ORDER — VANCOMYCIN HCL IN DEXTROSE 1-5 GM/200ML-% IV SOLN
1000.0000 mg | Freq: Two times a day (BID) | INTRAVENOUS | Status: DC
  Administered 2024-03-13 – 2024-03-15 (×4): 1000 mg via INTRAVENOUS
  Filled 2024-03-13 (×4): qty 200

## 2024-03-13 MED ORDER — FILGRASTIM 300 MCG/ML IJ SOLN
300.0000 ug | Freq: Every day | INTRAMUSCULAR | Status: DC
  Administered 2024-03-14: 300 ug via SUBCUTANEOUS
  Filled 2024-03-13 (×6): qty 1

## 2024-03-13 NOTE — Progress Notes (Signed)
 Received consult to assess port dressing. Per pt, had port placed 2 weeks ago. Dried blood found under dressing. Pt denied any trauma and stated it slowly got worse overnight. Changed dressing without incident. Insertion site showed no signs of bleeding, suspect bleeding came from incision site which was no longer oozing but also had dried blood over it. Primary rn at bedside

## 2024-03-13 NOTE — Progress Notes (Signed)
 Pharmacy Antibiotic Note  Heather Burke is a 73 y.o. female w/ PMH of HTN, HLD, DM, EtOH abuse, pancreatic cancer admitted on 03/08/2024 with SIRS vs. sepsis with unlcear source.  Pharmacy has been consulted for vancomycin and Zosyn dosing.  Today, 03/13/24: Lactic acid wnl on admission Pro cal 3.5 on admission WBC 23.7>>>>7.9>4.4>2.4 Afebrile CXR negative  UA: trace amounts of leukocytes with WBC 6-10; no UTI symptoms; Ucx NG final  - s/p cefepime and flagyl in the ED - 3/21 Vanc + Zosyn>>3/25  Bcx NG x 3 days Respiratory panel negative  Negative for C. diff  Plan:  1) Continue Zosyn 3.375g IV q8h (4 hour infusion).  2)  Change to Vancomycin to 1000 mg IV every 12 hours  Levels on previous dose of 1250mg  q24h Vancomycin Peak 3/25 @ 1850 Vancomycin Trough 3/26 @ 1520 T1/2 = 12.3 hr  Height: 5\' 3"  (160 cm) Weight: 50.6 kg (111 lb 8.8 oz) IBW/kg (Calculated) : 52.4  Temp (24hrs), Avg:98.1 F (36.7 C), Min:96.3 F (35.7 C), Max:99.7 F (37.6 C)  Recent Labs  Lab 03/08/24 1457 03/09/24 0116 03/09/24 1115 03/09/24 2110 03/10/24 0507 03/11/24 0658 03/12/24 0555 03/12/24 1850 03/13/24 0530 03/13/24 1520  WBC  --    < > 18.3*  --  9.9 7.9 4.5  4.4  --  2.4*  --   CREATININE  --    < > 0.61 0.55 0.55 0.53 0.38*  --  0.50  --   LATICACIDVEN 1.6  --   --   --   --   --   --   --   --   --   VANCOTROUGH  --   --   --   --   --   --   --   --   --  6*  VANCOPEAK  --   --   --   --   --   --   --  19*  --   --    < > = values in this interval not displayed.    Estimated Creatinine Clearance: 51.5 mL/min (by C-G formula based on SCr of 0.5 mg/dL).    Allergies  Allergen Reactions   Metformin And Related Diarrhea    SEVERE DIARRHEA   Amlodipine Nausea Only and Other (See Comments)    Stomach cramping   Atorvastatin Nausea And Vomiting   Lisinopril Cough   Pravastatin Nausea And Vomiting   Icosapent Ethyl (Epa Ethyl Ester) (Fish) Palpitations   Nifedipine Er  Other (See Comments)    headache    Antimicrobials this admission: 03/21 cefepime, metronidazole x 1 03/21 vancomycin >>  03/21 Zosyn >>   Microbiology results: 03/20 BCx: NG x 3 days 03/20 UCx: NG final   Thank you for allowing pharmacy to be a part of this patient's care.  Barrie Folk, PharmD Clinical Pharmacist  03/13/2024 4:05 PM

## 2024-03-13 NOTE — Plan of Care (Signed)
  Problem: Coping: Goal: Ability to adjust to condition or change in health will improve Outcome: Progressing   Problem: Health Behavior/Discharge Planning: Goal: Ability to identify and utilize available resources and services will improve Outcome: Progressing   Problem: Skin Integrity: Goal: Risk for impaired skin integrity will decrease Outcome: Progressing   Problem: Health Behavior/Discharge Planning: Goal: Ability to manage health-related needs will improve Outcome: Progressing   Problem: Activity: Goal: Risk for activity intolerance will decrease Outcome: Progressing   Problem: Pain Managment: Goal: General experience of comfort will improve and/or be controlled Outcome: Progressing   Problem: Safety: Goal: Ability to remain free from injury will improve Outcome: Progressing

## 2024-03-13 NOTE — Plan of Care (Signed)
  Problem: Coping: Goal: Ability to adjust to condition or change in health will improve Outcome: Progressing   Problem: Health Behavior/Discharge Planning: Goal: Ability to identify and utilize available resources and services will improve Outcome: Progressing   Problem: Skin Integrity: Goal: Risk for impaired skin integrity will decrease Outcome: Progressing   Problem: Activity: Goal: Risk for activity intolerance will decrease Outcome: Progressing   Problem: Pain Managment: Goal: General experience of comfort will improve and/or be controlled Outcome: Progressing   Problem: Safety: Goal: Ability to remain free from injury will improve Outcome: Progressing   Problem: Skin Integrity: Goal: Risk for impaired skin integrity will decrease Outcome: Progressing

## 2024-03-13 NOTE — Progress Notes (Signed)
 [facility] Progress Note  Patient: Heather Burke    GNF:[id number]  DOA: 03/08/2024     Date of Service: the patient was seen and examined on 03/13/2024  Chief Complaint  Patient presents with   Fever   Brief hospital course: CASSY SPROWL is a 73 y.o. female with medical history significant of recently diagnosed invasive stage IV pancreatic cancer on chemotherapy, breast cancer 2016, HTN, HLD, DM, dCHF, hypothyroidism, depression, who presents with intermittent fever.   Patient states that she has intermittent fever for almost 3 weeks.  Last night she had fever 102.0 with chills.  No chest pain, cough, SOB.  Patient does not have nausea vomiting or abdominal pain.  Patient has intermittent diarrhea and is taking Imodium as needed.  No symptoms of UTI.  Patient has been taking Levaquin prescribed by Dr. Phylliss Bob of oncology since 3/18 without improvement.  She had first dose of chemotherapy on 3/18 per patient.  She has generalized weakness, poor appetite and decreased oral intake. Pt was seen in cancer center and found to have hypothermia, and had blood culture drawn,  and was sent to ED for further evaluation and treatment.  Patient states that she stopped drinking alcohol since February.     Data reviewed independently and ED Course: pt was found to have WBC 23.7, sodium 120, GFR > 60, potassium 4.4, magnesium 1.8, phosphorus 3.0, urinalysis (clear appearance, trace amount of leukocyte, negative bacteria, WBC 6-10), negative PCR for COVID, flu and RSV, lactic acid 1.6, procalcitonin 3.50.  Temperature 100.2 --> 94.2, initial blood pressure 71/30 which improved to 119/53, heart rate 56, 20, oxygen saturation 96-99% on room air.  Chest x-ray negative for infiltration.  Patient is admitted to telemetry bed as inpatient.     EKG: I have personally reviewed.  Sinus rhythm, QTc 423, low voltage.   Assessment and Plan:  # SIRS (systemic inflammatory response syndrome) [facility]): Patient meets  criteria for SIRS with WBC 23.7 and hypothermia with temperature 94.2.  Initially hypotensive, which improved to 119/53 before giving IVF.   No clear source of infection identified so far, will treat patient as SIRS.  If source of infection is identified, will change to sepsis.   Lactic acid is normal.  Procalcitonin 3.50.  Chest x-ray negative for infiltration.  UA showed trace amount of leukocyte with WBC 6-10, but patient denies symptoms of UTI. -Continue vancomycin plus Zosyn (patient received 1 dose of cefepime and Flagyl in ED) -f/u Blood culture -IV fluid: 500 cc normal saline, s/p NS 75 cc/h -s/p 100 mg of solucortef as stress dose 3/25 patient spiked fever 101.4, WBC 4.4 and procalcitonin 3.5-->1.47 trended down.  Patient stated that she was wrapped in warm blankets that may be the reason of fever, does not want blood culture to be drawn. Continued antibiotics for now    # Malignant neoplasm of other parts of pancreas: Patient had 1 dose of chemotherapy -Follow-up with Dr. Smith Robert, oncologist 3/26 leukopenia and neutropenia, oncology aware, will eval for [facility]   # Hypotonic hyponatremia: Sodium 120, likely multifactorial etiology, including poor oral intake, Diovan/hydrochlorothiazide and  dehydration.  Mental status normal. Serum osmolality 270, low Continue fluid restriction S/p NS bolus and maintenance, no more IV fluid Started Salt Tabs 2 g p.o. 3 times daily Monitor sodium level - avoid over correction too fast due to risk of central pontine myelinolysis -Hold Diovan-HCTZ 3/26 Na 130 today    # Hypokalemia due to diarrhea, potassium repleted.  Resolved #  Hypophosphatemia, Phos repleted. Monitor electrolytes and replete as needed.   # Hypertension associated with diabetes -Hold blood pressure medications (diltiazem, metoprolol, Diovan-HCTZ) due to softer blood pressure   # Hypothyroidism: Synthroid # Hyperlipemia: Tricor # Chronic diastolic CHF (congestive heart failure):  2D echo on 10/09/2015 showed EF of 55 to 60% with grade 1 diastolic dysfunction.  Patient has 1+ leg edema, no shortness of breath.   BNP is 117, does not seem to have CHF exacerbation.   Will hold off diuretics due to SIRS. -Watch volume status closely   # Diarrhea: Negative C. Difficile Diarrhea improving, started probiotics. 3/25 GI pathogen negative 3/26 diarrhea improving  # Metabolic acidosis: most likely due to diarrhea, resolved s/p bicarbonate oral supplement.   # Alcohol dependence, daily use  -Patient stopped drinking alcohol since February   # Diabetes mellitus without complication: Recent A1c 6.9, well-controlled.  Patient is taking Lantus 16 units daily -Sliding scale insulin   # Depression -Continue home medications   # Protein-calorie malnutrition, moderate: Body weight 49.1 kg, BMI 19.17 -Ensure -Nutrition consult     Body mass index is 19.76 kg/m.  Interventions:  Diet: Diabetic diet DVT Prophylaxis: Subcutaneous Lovenox   Advance goals of care discussion: Full code  Family Communication: family was present at bedside, at the time of interview.  The pt provided permission to discuss medical plan with the family. Opportunity was given to ask question and all questions were answered satisfactorily.   Disposition:  Pt is from home, admitted with SIRS, diarrhea, hyponatremia, still has low sodium on IV antibiotics, which precludes a safe discharge. Discharge to home, when stable, most likely in 2-3 days when sodium level improves   Subjective: No significant events overnight, diarrhea is improving, patient had 1 BM which is soft stool, overall she feels improvement.  Diet is improving.  Still has significant edema in the lower extremities.  Physical Exam: General: NAD, lying comfortably Appear in no distress, affect appropriate Eyes: PERRLA ENT: Oral Mucosa Clear, moist  Neck: no JVD,  Cardiovascular: S1 and S2 Present, no Murmur,  Respiratory: good  respiratory effort, Bilateral Air entry equal and Decreased, no Crackles, no wheezes Abdomen: Bowel Sound present, distended but soft and no tenderness,  Skin: no rashes Extremities: 2+ Pedal edema, no calf tenderness Neurologic: without any new focal findings Gait not checked due to patient safety concerns  Vitals:   03/13/24 0500 03/13/24 0606 03/13/24 0741 03/13/24 1200  BP: (!) 133/59  114/67 (!) 119/58  Pulse: 94  96 80  Resp: 18  17 16   Temp: 99.7 F (37.6 C)  99.4 F (37.4 C) 97.6 F (36.4 C)  TempSrc: Oral     SpO2: 96%  96% 100%  Weight:  50.6 kg    Height:        Intake/Output Summary (Last 24 hours) at 03/13/2024 1528 Last data filed at 03/13/2024 0900 Gross per 24 hour  Intake 940 ml  Output --  Net 940 ml   Filed Weights   03/11/24 0500 03/12/24 0500 03/13/24 0606  Weight: 51.9 kg 50.8 kg 50.6 kg    Data Reviewed: I have personally reviewed and interpreted daily labs, tele strips, imagings as discussed above. I reviewed all nursing notes, pharmacy notes, vitals, pertinent old records I have discussed plan of care as described above with RN and patient/family.  CBC: Recent Labs  Lab 03/07/24 1501 03/08/24 1321 03/09/24 0433 03/09/24 1115 03/10/24 0507 03/11/24 0658 03/12/24 0555 03/13/24 0530  WBC 23.7* 23.7*   < >  18.3* 9.9 7.9 4.5  4.4 2.4*  NEUTROABS 21.6* 21.8*  --   --   --   --  3.8 1.6*  HGB 11.0* 10.7*   < > 11.1* 9.8* 8.9* 8.5*  8.5* 7.9*  HCT 32.9* 30.7*   < > 31.3* 27.7* 24.8* 24.1*  23.8* 22.4*  MCV 87.0 84.3   < > 83.7 81.5 83.2 82.5  82.1 83.6  PLT 222 155   < > 171 101* 56* 48*  47* 57*   < > = values in this interval not displayed.   Basic Metabolic Panel: Recent Labs  Lab 03/09/24 1115 03/09/24 2110 03/10/24 0507 03/11/24 0658 03/12/24 0555 03/13/24 0530  NA 122* 125* 126* 127* 130* 130*  K 4.0 3.8 3.4* 3.4* 3.1* 3.8  CL 97* 98 98 99 100 105  CO2 18* 21* 21* 22 22 21*  GLUCOSE 168* 102* 86 141* 96 97  BUN 30* 25*  22 15 11 11   CREATININE 0.61 0.55 0.55 0.53 0.38* 0.50  CALCIUM 7.3* 7.2* 7.0* 6.9* 7.0* 6.9*  MG 1.9  --  1.9 1.8 1.8 1.8  PHOS 3.0  --  2.5 2.4* 2.2* 1.7*    Studies: No results found.   Scheduled Meds:  Chlorhexidine Gluconate Cloth  6 each Topical Daily   cholecalciferol  5,000 Units Oral Daily   feeding supplement  1 Container Oral TID BM   feeding supplement (GLUCERNA SHAKE)  237 mL Oral TID BM   fenofibrate  160 mg Oral Daily   folic acid  1 mg Oral Daily   insulin aspart  0-5 Units Subcutaneous QHS   insulin aspart  0-9 Units Subcutaneous TID WC   insulin glargine-yfgn  8 Units Subcutaneous Daily   latanoprost  1 drop Both Eyes QHS   multivitamin with minerals  1 tablet Oral Daily   nicotine  21 mg Transdermal Daily   saccharomyces boulardii  250 mg Oral BID   sodium chloride  2 g Oral TID WC   thiamine  100 mg Oral Daily   Continuous Infusions:  piperacillin-tazobactam (ZOSYN)  IV 3.375 g (03/13/24 1357)   sodium PHOSPHATE IVPB (in mmol) 30 mmol (03/13/24 1222)   vancomycin 1,250 mg (03/12/24 1439)   PRN Meds: acetaminophen, fluticasone, ondansetron (ZOFRAN) IV, oxyCODONE  Time spent: 35 minutes  Author: Gillis Santa. MD [facility] 03/13/2024 3:28 PM  To reach On-call, see care teams to locate the attending and reach out to them via [email or web address]. If 7PM-7AM, please contact night-coverage If you still have difficulty reaching the attending provider, please page the [facility] (Director on Call) for [facility] on amion for assistance.

## 2024-03-13 NOTE — Progress Notes (Signed)
 Hematology/Oncology Consult note [facility]  [phone] [phone]  Patient Care Team: Sallee Provencal, FNP as PCP - General (Family Medicine) Irene Limbo., MD as Consulting Physician (Ophthalmology) Ronney Asters, Jackelyn Poling, RPH-CPP as Pharmacist Benita Gutter, RN as Oncology Nurse Navigator Creig Hines, MD as Consulting Physician (Oncology)   Name of the patient: Heather Burke  [id number]  08-31-1951   Date of visit: @TODAY @  Diagnosis- ***  Chief complaint/ Reason for visit- ***  Heme/Onc history: ***  Interval history- ***  ECOG PS- *** Pain scale- *** Opioid associated constipation- ***  Review of systems- ROS    Allergies  Allergen Reactions   Metformin And Related Diarrhea    SEVERE DIARRHEA   Amlodipine Nausea Only and Other (See Comments)    Stomach cramping   Atorvastatin Nausea And Vomiting   Lisinopril Cough   Pravastatin Nausea And Vomiting   Icosapent Ethyl (Epa Ethyl Ester) (Fish) Palpitations   Nifedipine Er Other (See Comments)    headache     Past Medical History:  Diagnosis Date   Breast cancer (HCC) 06/08/2015   T1c, N0;  ER+,PR+, Her 2 neg (FISH neg) x2., SLN x 3 negative. Mammoprint: Low risk. Multifocal.Invasive mammary and lobular carcinoma.    Diabetes mellitus without complication (HCC)    GERD (gastroesophageal reflux disease)    Heart murmur 2016   newly diagnosed, slight murmur   Hyperlipidemia    Hypertension    Hypothyroidism    Lupus    PONV (postoperative nausea and vomiting)    with Hysterectomy   Port-A-Cath in place    Thyroid disease      Past Surgical History:  Procedure Laterality Date   ABDOMINAL HYSTERECTOMY     BREAST BIOPSY Right 2002   negative/ Dr. Lemar Livings   BREAST BIOPSY Left 06-08-15   INVASIVE MAMMARY CARCINOMA WITH PARTIAL SOLID PATTERN.    BREAST BIOPSY Right 06/26/2018   FIBROADENOMATOUS CHANGES WITH STROMAL HYALINIZATION AND  CALCIFICATIONS   COLONOSCOPY WITH PROPOFOL N/A 09/13/2016   Procedure: COLONOSCOPY WITH PROPOFOL;  Surgeon: Midge Minium, MD;  Location: [facility];  Service: Endoscopy;  Laterality: N/A;   core biopsy Left 06/08/15   GUM SURGERY  2006   IR IMAGING GUIDED PORT INSERTION  02/28/2024   MASTECTOMY Left 2016   OOPHORECTOMY     SENTINEL NODE BIOPSY Left 08/18/2015   Procedure: SENTINEL NODE BIOPSY;  Surgeon: Earline Mayotte, MD;  Location: [facility];  Service: General;  Laterality: Left;   SIMPLE MASTECTOMY WITH AXILLARY SENTINEL NODE BIOPSY Left 08/18/2015   Procedure: SIMPLE MASTECTOMY;  Surgeon: Earline Mayotte, MD;  Location: [facility];  Service: General;  Laterality: Left;   TONSILLECTOMY      Social History   Socioeconomic History   Marital status: Widowed    Spouse name: Not on file   Number of children: 2   Years of education: H/S   Highest education level: 12th grade  Occupational History   Occupation: Part-Time    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 PT states no beer since 02/10/24   Drug use: No   Sexual activity: Not on file  Other Topics Concern   Not on file  Social History Narrative   Not on file   Social Drivers of Health   Financial Resource Strain: Low Risk  (12/26/2023)   Overall Financial Resource Strain (CARDIA)    Difficulty of Paying Living Expenses: Not hard at all  Food Insecurity: No Food Insecurity (03/08/2024)   Hunger Vital Sign    Worried About Running Out of Food in the Last  Year: Never true    Ran Out of Food in the Last Year: Never true  Transportation Needs: No Transportation Needs (03/08/2024)   PRAPARE - Administrator, Civil Service (Medical): No    Lack of Transportation (Non-Medical): No  Physical Activity: Insufficiently Active (12/26/2023)   Exercise Vital Sign    Days of Exercise per Week: 4 days    Minutes of Exercise per Session: 30 min  Stress: No Stress Concern Present (12/26/2023)   [employer] of Occupational Health - Occupational Stress Questionnaire    Feeling of Stress : Not at all  Social Connections: Moderately Isolated (03/08/2024)   Social Connection and Isolation Panel [NHANES]    Frequency of Communication with Friends and Family: More than three times a week    Frequency of Social Gatherings with Friends and Family: More than three times a week    Attends Religious Services: 1 to 4 times per year    Active Member of [employer] or Organizations: No    Attends Banker Meetings: Never    Marital Status: Widowed  Intimate Partner Violence: Not At Risk (03/08/2024)   Humiliation, Afraid, Rape, and Kick questionnaire    Fear of Current or Ex-Partner: No    Emotionally Abused: No    Physically Abused: No    Sexually Abused: No    Family History  Problem Relation Age of Onset   Cancer Father        prostate   Thyroid disease Daughter    Heart attack Sister    Breast cancer Cousin    Breast cancer Cousin    Breast cancer Other      Current Facility-Administered Medications:    acetaminophen (TYLENOL) tablet 650 mg, 650 mg, Oral, Q6H PRN, Lorretta Harp, MD, 650 mg at 03/12/24 8756   Chlorhexidine Gluconate Cloth 2 % PADS 6 each, 6 each, Topical, Daily, Gillis Santa, MD, 6 each at 03/13/24 1038   cholecalciferol (VITAMIN D3) 25 MCG (1000 UNIT) tablet 5,000 Units, 5,000 Units, Oral, Daily, Lorretta Harp, MD, 5,000 Units at 03/13/24 0933   feeding supplement (BOOST / RESOURCE BREEZE) liquid 1 Container, 1 Container,  Oral, TID BM, Gillis Santa, MD, 1 Container at 03/12/24 0837   feeding supplement (GLUCERNA SHAKE) (GLUCERNA SHAKE) liquid 237 mL, 237 mL, Oral, TID BM, Gillis Santa, MD   fenofibrate tablet 160 mg, 160 mg, Oral, Daily, Lorretta Harp, MD, 160 mg at 03/13/24 1054   [START ON 03/14/2024] filgrastim (NEUPOGEN) injection 300 mcg, 300 mcg, Subcutaneous, Daily, Creig Hines, MD   fluticasone (FLONASE) 50 MCG/ACT nasal spray 1 spray, 1 spray, Each Nare, BID PRN, Gillis Santa, MD, 1 spray at 03/10/24 2041   folic acid (FOLVITE) tablet 1 mg, 1 mg, Oral, Daily, Lucianne Muss, Dileep,  MD, 1 mg at 03/13/24 0933   insulin aspart (novoLOG) injection 0-5 Units, 0-5 Units, Subcutaneous, QHS, Lorretta Harp, MD   insulin aspart (novoLOG) injection 0-9 Units, 0-9 Units, Subcutaneous, TID WC, Lorretta Harp, MD, 1 Units at 03/13/24 1232   insulin glargine-yfgn [facility]) injection 8 Units, 8 Units, Subcutaneous, Daily, Gillis Santa, MD, 8 Units at 03/13/24 0942   latanoprost (XALATAN) 0.005 % ophthalmic solution 1 drop, 1 drop, Both Eyes, QHS, Gillis Santa, MD, 1 drop at 03/12/24 2134   multivitamin with minerals tablet 1 tablet, 1 tablet, Oral, Daily, Gillis Santa, MD, 1 tablet at 03/13/24 1245   nicotine (NICODERM CQ - dosed in mg/24 hours) patch 21 mg, 21 mg, Transdermal, Daily, Lorretta Harp, MD, 21 mg at 03/13/24 0942   ondansetron (ZOFRAN) injection 4 mg, 4 mg, Intravenous, Q8H PRN, Lorretta Harp, MD   oxyCODONE (Oxy IR/ROXICODONE) immediate release tablet 5 mg, 5 mg, Oral, Q4H PRN, Lorretta Harp, MD   piperacillin-tazobactam (ZOSYN) IVPB 3.375 g, 3.375 g, Intravenous, Q8H, Gillis Santa, MD, Last Rate: 12.5 mL/hr at 03/13/24 1357, 3.375 g at 03/13/24 1357   saccharomyces boulardii (FLORASTOR) capsule 250 mg, 250 mg, Oral, BID, Gillis Santa, MD, 250 mg at 03/13/24 1055   sodium chloride tablet 2 g, 2 g, Oral, TID WC, Gillis Santa, MD, 2 g at 03/13/24 1232   sodium phosphate 30 mmol in sodium chloride 0.9 % 250 mL infusion, 30 mmol,  Intravenous, Once, Gillis Santa, MD, Last Rate: 43 mL/hr at 03/13/24 1222, 30 mmol at 03/13/24 1222   thiamine (VITAMIN B1) tablet 100 mg, 100 mg, Oral, Daily, Gillis Santa, MD, 100 mg at 03/13/24 0932   vancomycin (VANCOCIN) IVPB 1000 mg/200 mL premix, 1,000 mg, Intravenous, Nile Riggs, [facility]  Physical exam:  Vitals:   03/13/24 0500 03/13/24 0606 03/13/24 0741 03/13/24 1200  BP: (!) 133/59  114/67 (!) 119/58  Pulse: 94  96 80  Resp: 18  17 16   Temp: 99.7 F (37.6 C)  99.4 F (37.4 C) 97.6 F (36.4 C)  TempSrc: Oral     SpO2: 96%  96% 100%  Weight:  111 lb 8.8 oz (50.6 kg)    Height:       Physical Exam      Latest Ref Rng & Units 03/13/2024    5:30 AM  CMP  Glucose 70 - 99 mg/dL 97   BUN 8 - 23 mg/dL 11   Creatinine 9.14 - 1.00 mg/dL 7.82   Sodium 956 - 213 mmol/L 130   Potassium 3.5 - 5.1 mmol/L 3.8   Chloride 98 - 111 mmol/L 105   CO2 22 - 32 mmol/L 21   Calcium 8.9 - 10.3 mg/dL 6.9   Total Protein 6.5 - 8.1 g/dL 4.6   Total Bilirubin 0.0 - 1.2 mg/dL 1.3   Alkaline Phos 38 - 126 U/L 99   AST 15 - 41 U/L 33   ALT 0 - 44 U/L 12       Latest Ref Rng & Units 03/13/2024    5:30 AM  CBC  WBC 4.0 - 10.5 K/uL 2.4   Hemoglobin 12.0 - 15.0 g/dL 7.9   Hematocrit 08.6 - 46.0 % 22.4   Platelets 150 - 400 K/uL 57     @IMAGES @  DG Chest 2 View Result Date: 03/08/2024 CLINICAL DATA:  Fever.  Sepsis. EXAM: CHEST - 2 VIEW COMPARISON:  Yesterday FINDINGS: Apical lordotic frontal radiograph. AP lateral views. Right Port-A-Cath tip superior caval/atrial junction. Midline trachea.  Borderline cardiomegaly. Atherosclerosis in the transverse aorta. Tiny bilateral pleural effusions are similar. No pneumothorax. No congestive failure. Similar subsegmental atelectasis at both lung bases. IMPRESSION: No significant change since one day prior. Tiny bilateral pleural effusions with subsegmental atelectasis at both lung bases. Aortic Atherosclerosis (ICD10-I70.0). Electronically  Signed   By: Jeronimo Greaves M.D.   On: 03/08/2024 18:32   DG Chest 2 View Result Date: 03/07/2024 CLINICAL DATA:  Cough, fever. EXAM: CHEST - 2 VIEW COMPARISON:  None Available. FINDINGS: The heart size and mediastinal contours are within normal limits. Right internal jugular Port-A-Cath is in grossly good position. Minimal bibasilar subsegmental atelectasis is noted with small pleural effusions. The visualized skeletal structures are unremarkable. IMPRESSION: Minimal bibasilar subsegmental atelectasis is noted with small pleural effusions. Electronically Signed   By: Lupita Raider M.D.   On: 03/07/2024 16:42   IR IMAGING GUIDED PORT INSERTION Result Date: 02/28/2024 INDICATION: 73 year old female with pancreatic cancer in need of durable venous access to allow for chemotherapy. She presents for port catheter placement. EXAM: IMPLANTED PORT A CATH PLACEMENT WITH ULTRASOUND AND FLUOROSCOPIC GUIDANCE MEDICATIONS: None. ANESTHESIA/SEDATION: Versed 1 mg IV; Fentanyl 50 mcg IV; administered by the radiology nurse Moderate Sedation Time:  10 minutes The patient's vital signs and level of consciousness were monitored during the procedure by the interventional radiology nurse under my direct supervision. FLUOROSCOPY: Radiation exposure index: 2 mGy, reference air kerma COMPLICATIONS: None immediate. PROCEDURE: The right neck and chest was prepped with chlorhexidine, and draped in the usual sterile fashion using maximum barrier technique (cap and mask, sterile gown, sterile gloves, large sterile sheet, hand hygiene and cutaneous antiseptic). Local anesthesia was attained by infiltration with 1% lidocaine with epinephrine. Ultrasound demonstrated patency of the right internal jugular vein, and this was documented with an image. Under real-time ultrasound guidance, this vein was accessed with a 21 gauge micropuncture needle and image documentation was performed. A small dermatotomy was made at the access site with an 11  scalpel. A 0.018" wire was advanced into the SVC and the access needle exchanged for a 49F micropuncture vascular sheath. The 0.018" wire was then removed and a 0.035" wire advanced into the IVC. An appropriate location for the subcutaneous reservoir was selected below the clavicle and an incision was made through the skin and underlying soft tissues. The subcutaneous tissues were then dissected using a combination of blunt and sharp surgical technique and a pocket was formed. A Bard Clear Vue single lumen power injectable portacatheter was then tunneled through the subcutaneous tissues from the pocket to the dermatotomy and the port reservoir placed within the subcutaneous pocket. The venous access site was then serially dilated and a peel away vascular sheath placed over the wire. The wire was removed and the port catheter advanced into position under fluoroscopic guidance. The catheter tip is positioned in the superior cavoatrial junction. This was documented with a spot image. The portacatheter was then tested and found to flush and aspirate well. The port was flushed with saline followed by 100 units/mL heparinized saline. The pocket was then closed in two layers using first subdermal inverted interrupted absorbable sutures followed by a running subcuticular suture. The epidermis was then sealed with Dermabond. The dermatotomy at the venous access site was also closed with Dermabond. IMPRESSION: Successful placement of a right IJ approach Bard Clear Vue port catheter with ultrasound and fluoroscopic guidance. The catheter is ready for use. Electronically Signed   By: Malachy Moan M.D.   On: 02/28/2024  15:28   NM PET Image Initial (PI) Skull Base To Thigh Result Date: 02/23/2024 CLINICAL DATA:  Initial treatment strategy for pancreatic carcinoma. EXAM: NUCLEAR MEDICINE PET SKULL BASE TO THIGH TECHNIQUE: 7.1 mCi F-18 FDG was injected intravenously. Full-ring PET imaging was performed from the skull base to  thigh after the radiotracer. CT data was obtained and used for attenuation correction and anatomic localization. Fasting blood glucose: 75 mg/dl COMPARISON:  CT on 78/29/5621 FINDINGS: Mediastinal blood-pool activity (background): SUV max = 1.9 Liver activity (reference): SUV max = N/A NECK:  No hypermetabolic lymph nodes or masses. Incidental CT findings:  None. CHEST: 5 mm left internal mammary chain lymph node is hypermetabolic, with SUV max of 4.3. 5 mm precardiac mediastinal lymph node is also hypermetabolic, with SUV max of 4.0. No suspicious pulmonary nodules seen on CT images. Incidental CT findings:  None. ABDOMEN/PELVIS: 3.8 x 3.6 cm mass in the pancreatic head shows intense hypermetabolism, with SUV max of 15.4, consistent with primary pancreatic carcinoma. Hypermetabolic lymphadenopathy is seen in the adjacent peripancreatic retroperitoneum, gastrohepatic ligament, porta hepatis, and portacaval space, with 1 index lymph node in the gastrohepatic ligament measuring 2.1 cm with SUV max of 14.2. Numerous hypermetabolic metastases are seen throughout liver, largest occupying nearly the entire lateral segment of the left hepatic lobe this has SUV max of 14.2. A solid mass is seen in the anterior midpole of the right kidney measuring 4.7 x 4.4 cm. This shows mild FDG uptake compared to other disease, with SUV max of 2.9. This favors primary renal cell carcinoma over renal metastasis. No hypermetabolic masses or lymphadenopathy identified within pelvis. Incidental CT findings: Prior hysterectomy. Small amount of free fluid in pelvis. Colonic diverticulosis, without signs of diverticulitis. SKELETON: No focal hypermetabolic bone lesions to suggest skeletal metastasis. Incidental CT findings:  None. IMPRESSION: 3.8 cm hypermetabolic mass in pancreatic head, consistent with primary pancreatic carcinoma. Hypermetabolic abdominal lymphadenopathy, consistent with metastatic disease. Diffuse hypermetabolic liver  metastases. 5 mm hypermetabolic left internal mammary and precardiac lymph nodes, consistent with metastatic disease. 4.7 cm solid mass in anterior midpole of right kidney, which shows mild FDG uptake. This favors primary renal cell carcinoma over renal metastasis. Electronically Signed   By: Danae Orleans M.D.   On: 02/23/2024 08:35   US BIOPSY (LIVER) Result Date: 02/20/2024 INDICATION: Multiple liver lesions, mass of the head of the pancreas, right renal mass and prior history of breast carcinoma. Clinical suspicion of metastatic pancreatic carcinoma based on recent imaging. Presenting for liver lesion biopsy. EXAM: ULTRASOUND GUIDED CORE BIOPSY OF LIVER MASS MEDICATIONS: None. ANESTHESIA/SEDATION: Moderate (conscious) sedation was employed during this procedure. A total of Versed 1.0 mg and Fentanyl 50 mcg was administered intravenously. Moderate Sedation Time: 13 minutes. The patient's level of consciousness and vital signs were monitored continuously by radiology nursing throughout the procedure under my direct supervision. PROCEDURE: The procedure, risks, benefits, and alternatives were explained to the patient. Questions regarding the procedure were encouraged and answered. The patient understands and consents to the procedure. A time-out was performed prior to initiating the procedure. The abdominal wall was prepped with chlorhexidine in a sterile fashion, and a sterile drape was applied covering the operative field. A sterile gown and sterile gloves were used for the procedure. Local anesthesia was provided with 1% Lidocaine. Ultrasound was used to localize a lesion within the left lobe of the liver. Under ultrasound guidance, a 17 gauge trocar needle was advanced into the lesion. Three separate coaxial 18 gauge core biopsy  samples were obtained and submitted in formalin. A slurry of Gel-Foam EmboCubes was then injected as the trocar needle was retracted and removed. Additional ultrasound imaging was  performed. COMPLICATIONS: None immediate. FINDINGS: Confluent tumor in the left lobe of the liver was localized measuring nearly 9 cm in maximum diameter. Solid tissue was obtained. IMPRESSION: Ultrasound-guided core biopsy performed of tumor within the left lobe of the liver. Electronically Signed   By: Irish Lack M.D.   On: 02/20/2024 10:00     Assessment and plan- Patient is a 73 y.o. female ***   Visit Diagnosis 1. Sepsis without acute organ dysfunction, due to unspecified organism (HCC)   2. Malignant neoplasm of pancreas, unspecified location of malignancy (HCC)   3. Chemotherapy induced neutropenia (HCC)      Dr. Owens Shark, MD, MPH [facility] [phone] 03/13/2024 4:38 PM

## 2024-03-13 NOTE — Plan of Care (Signed)
   Problem: Education: Goal: Ability to describe self-care measures that may prevent or decrease complications (Diabetes Survival Skills Education) will improve Outcome: Progressing Goal: Individualized Educational Video(s) Outcome: Progressing   Problem: Coping: Goal: Ability to adjust to condition or change in health will improve Outcome: Progressing

## 2024-03-14 DIAGNOSIS — T451X5A Adverse effect of antineoplastic and immunosuppressive drugs, initial encounter: Secondary | ICD-10-CM

## 2024-03-14 DIAGNOSIS — R6510 Systemic inflammatory response syndrome (SIRS) of non-infectious origin without acute organ dysfunction: Secondary | ICD-10-CM | POA: Diagnosis not present

## 2024-03-14 LAB — CBC WITH DIFFERENTIAL/PLATELET
Abs Immature Granulocytes: 0.04 10*3/uL (ref 0.00–0.07)
Abs Immature Granulocytes: 0.08 10*3/uL — ABNORMAL HIGH (ref 0.00–0.07)
Basophils Absolute: 0 10*3/uL (ref 0.0–0.1)
Basophils Absolute: 0 10*3/uL (ref 0.0–0.1)
Basophils Relative: 0 %
Basophils Relative: 0 %
Eosinophils Absolute: 0 10*3/uL (ref 0.0–0.5)
Eosinophils Absolute: 0 10*3/uL (ref 0.0–0.5)
Eosinophils Relative: 0 %
Eosinophils Relative: 1 %
HCT: 20.6 % — ABNORMAL LOW (ref 36.0–46.0)
HCT: 21.3 % — ABNORMAL LOW (ref 36.0–46.0)
Hemoglobin: 7.2 g/dL — ABNORMAL LOW (ref 12.0–15.0)
Hemoglobin: 7.4 g/dL — ABNORMAL LOW (ref 12.0–15.0)
Immature Granulocytes: 3 %
Immature Granulocytes: 6 %
Lymphocytes Relative: 36 %
Lymphocytes Relative: 46 %
Lymphs Abs: 0.5 10*3/uL — ABNORMAL LOW (ref 0.7–4.0)
Lymphs Abs: 0.6 10*3/uL — ABNORMAL LOW (ref 0.7–4.0)
MCH: 29.3 pg (ref 26.0–34.0)
MCH: 29.4 pg (ref 26.0–34.0)
MCHC: 35 g/dL (ref 30.0–36.0)
MCHC: 34.7 g/dL (ref 30.0–36.0)
MCV: 83.7 fL (ref 80.0–100.0)
MCV: 84.5 fL (ref 80.0–100.0)
Monocytes Absolute: 0.2 10*3/uL (ref 0.1–1.0)
Monocytes Absolute: 0.2 10*3/uL (ref 0.1–1.0)
Monocytes Relative: 15 %
Monocytes Relative: 13 %
Neutro Abs: 0.6 10*3/uL — ABNORMAL LOW (ref 1.7–7.7)
Neutro Abs: 0.5 10*3/uL — ABNORMAL LOW (ref 1.7–7.7)
Neutrophils Relative %: 46 %
Neutrophils Relative %: 34 %
Platelets: 86 10*3/uL — ABNORMAL LOW (ref 150–400)
Platelets: 102 10*3/uL — ABNORMAL LOW (ref 150–400)
RBC: 2.46 MIL/uL — ABNORMAL LOW (ref 3.87–5.11)
RBC: 2.52 MIL/uL — ABNORMAL LOW (ref 3.87–5.11)
RDW: 14.4 % (ref 11.5–15.5)
RDW: 14.6 % (ref 11.5–15.5)
Smear Review: NORMAL
WBC: 1.3 10*3/uL — CL (ref 4.0–10.5)
WBC: 1.4 10*3/uL — CL (ref 4.0–10.5)
nRBC: 0 % (ref 0.0–0.2)
nRBC: 0 % (ref 0.0–0.2)

## 2024-03-14 LAB — MAGNESIUM: Magnesium: 1.6 mg/dL — ABNORMAL LOW (ref 1.7–2.4)

## 2024-03-14 LAB — HEPATIC FUNCTION PANEL
ALT: 10 U/L (ref 0–44)
AST: 31 U/L (ref 15–41)
Albumin: <1.5 g/dL — ABNORMAL LOW (ref 3.5–5.0)
Alkaline Phosphatase: 119 U/L (ref 38–126)
Bilirubin, Direct: 0.6 mg/dL — ABNORMAL HIGH (ref 0.0–0.2)
Indirect Bilirubin: 0.4 mg/dL (ref 0.3–0.9)
Total Bilirubin: 1 mg/dL (ref 0.0–1.2)
Total Protein: 4.3 g/dL — ABNORMAL LOW (ref 6.5–8.1)

## 2024-03-14 LAB — BASIC METABOLIC PANEL WITH GFR
Anion gap: 5 (ref 5–15)
BUN: 11 mg/dL (ref 8–23)
CO2: 22 mmol/L (ref 22–32)
Calcium: 6.9 mg/dL — ABNORMAL LOW (ref 8.9–10.3)
Chloride: 103 mmol/L (ref 98–111)
Creatinine, Ser: 0.55 mg/dL (ref 0.44–1.00)
GFR, Estimated: >60 mL/min (ref >60)
Glucose, Bld: 89 mg/dL (ref 70–99)
Potassium: 3.6 mmol/L (ref 3.5–5.1)
Sodium: 130 mmol/L — ABNORMAL LOW (ref 135–145)

## 2024-03-14 LAB — GLUCOSE, CAPILLARY
Glucose-Capillary: 63 mg/dL — ABNORMAL LOW (ref 70–99)
Glucose-Capillary: 164 mg/dL — ABNORMAL HIGH (ref 70–99)
Glucose-Capillary: 106 mg/dL — ABNORMAL HIGH (ref 70–99)

## 2024-03-14 LAB — PHOSPHORUS: Phosphorus: 2.4 mg/dL — ABNORMAL LOW (ref 2.5–4.6)

## 2024-03-14 MED ORDER — MAGNESIUM SULFATE 2 GM/50ML IV SOLN
2.0000 g | Freq: Once | INTRAVENOUS | Status: AC
  Administered 2024-03-14: 2 g via INTRAVENOUS
  Filled 2024-03-14: qty 50

## 2024-03-14 MED ORDER — SODIUM CHLORIDE 1 G PO TABS
2.0000 g | ORAL_TABLET | Freq: Three times a day (TID) | ORAL | Status: DC
  Administered 2024-03-14 – 2024-03-16 (×6): 2 g via ORAL
  Filled 2024-03-14 (×6): qty 2

## 2024-03-14 MED ORDER — FUROSEMIDE 10 MG/ML IJ SOLN
40.0000 mg | Freq: Once | INTRAMUSCULAR | Status: AC
  Administered 2024-03-14: 40 mg via INTRAVENOUS
  Filled 2024-03-14: qty 4

## 2024-03-14 MED ORDER — K PHOS MONO-SOD PHOS DI & MONO 155-852-130 MG PO TABS
500.0000 mg | ORAL_TABLET | Freq: Three times a day (TID) | ORAL | Status: AC
  Administered 2024-03-14 (×3): 500 mg via ORAL
  Filled 2024-03-14 (×3): qty 2

## 2024-03-14 NOTE — Plan of Care (Signed)
  Problem: Health Behavior/Discharge Planning: Goal: Ability to identify and utilize available resources and services will improve Outcome: Progressing   Problem: Skin Integrity: Goal: Risk for impaired skin integrity will decrease Outcome: Progressing   Problem: Activity: Goal: Risk for activity intolerance will decrease Outcome: Progressing   Problem: Pain Managment: Goal: General experience of comfort will improve and/or be controlled Outcome: Progressing   Problem: Safety: Goal: Ability to remain free from injury will improve Outcome: Progressing   Problem: Skin Integrity: Goal: Risk for impaired skin integrity will decrease Outcome: Progressing

## 2024-03-14 NOTE — Progress Notes (Signed)
 [facility] Progress Note  Patient: Heather Burke    XBJ:[id number]  DOA: 03/08/2024     Date of Service: the patient was seen and examined on 03/14/2024  Chief Complaint  Patient presents with   Fever   Brief hospital course: Heather Burke is a 73 y.o. female with medical history significant of recently diagnosed invasive stage IV pancreatic cancer on chemotherapy, breast cancer 2016, HTN, HLD, DM, dCHF, hypothyroidism, depression, who presents with intermittent fever.   Patient states that she has intermittent fever for almost 3 weeks.  Last night she had fever 102.0 with chills.  No chest pain, cough, SOB.  Patient does not have nausea vomiting or abdominal pain.  Patient has intermittent diarrhea and is taking Imodium as needed.  No symptoms of UTI.  Patient has been taking Levaquin prescribed by Dr. Phylliss Bob of oncology since 3/18 without improvement.  She had first dose of chemotherapy on 3/18 per patient.  She has generalized weakness, poor appetite and decreased oral intake. Pt was seen in cancer center and found to have hypothermia, and had blood culture drawn,  and was sent to ED for further evaluation and treatment.  Patient states that she stopped drinking alcohol since February.     Data reviewed independently and ED Course: pt was found to have WBC 23.7, sodium 120, GFR > 60, potassium 4.4, magnesium 1.8, phosphorus 3.0, urinalysis (clear appearance, trace amount of leukocyte, negative bacteria, WBC 6-10), negative PCR for COVID, flu and RSV, lactic acid 1.6, procalcitonin 3.50.  Temperature 100.2 --> 94.2, initial blood pressure 71/30 which improved to 119/53, heart rate 56, 20, oxygen saturation 96-99% on room air.  Chest x-ray negative for infiltration.  Patient is admitted to telemetry bed as inpatient.     EKG: I have personally reviewed.  Sinus rhythm, QTc 423, low voltage.   Assessment and Plan:  # SIRS (systemic inflammatory response syndrome) [facility]): Patient meets  criteria for SIRS with WBC 23.7 and hypothermia with temperature 94.2.  Initially hypotensive, which improved to 119/53 before giving IVF.   No clear source of infection identified so far, will treat patient as SIRS.  If source of infection is identified, will change to sepsis.   Lactic acid is normal.  Procalcitonin 3.50.  Chest x-ray negative for infiltration.  UA showed trace amount of leukocyte with WBC 6-10, but patient denies symptoms of UTI. -Continue vancomycin plus Zosyn (patient received 1 dose of cefepime and Flagyl in ED) -f/u Blood culture -IV fluid: 500 cc normal saline, s/p NS 75 cc/h -s/p 100 mg of solucortef as stress dose 3/25 patient spiked fever 101.4, WBC 4.4 and procalcitonin 3.5-->1.47 trended down.  Patient stated that she was wrapped in warm blankets that may be the reason of fever, does not want blood culture to be drawn. Continued antibiotics for now    # Malignant neoplasm of other parts of pancreas: Patient had 1 dose of chemotherapy -Follow-up with Dr. Smith Robert, oncologist 3/26 Leukopenia and Neutropenia, oncology started filgrastim 300 mcg subcu daily from 3/27 x 4 doses 3/27 WBC 1.3 and ANC 0.6   # Pancytopenia most likely due to chemotherapy induced bone marrow suppression Continue filgrastim as above 3/27 Hb 7.2 Follow repeat H&H and transfuse if hemoglobin less than 7 Patient agreed with blood transfusion    # Hypotonic hyponatremia: Sodium 120, likely multifactorial etiology, including poor oral intake, Diovan/hydrochlorothiazide and  dehydration.  Mental status normal. Serum osmolality 270, low Continue fluid restriction S/p NS bolus and maintenance,  no more IV fluid Started Salt Tabs 2 g p.o. 3 times daily Monitor sodium level - avoid over correction too fast due to risk of central pontine myelinolysis -Hold Diovan-HCTZ 3/27 Na 130 today   # Hypomagnesemia, mag repleted. # Hypokalemia due to diarrhea, potassium repleted.  Resolved #  Hypophosphatemia, Phos repleted. Monitor electrolytes and replete as needed.   # Hypertension associated with diabetes -Hold blood pressure medications (diltiazem, metoprolol, Diovan-HCTZ) due to softer blood pressure   # Hypothyroidism: Synthroid # Hyperlipemia: Tricor # Chronic diastolic CHF (congestive heart failure): 2D echo on 10/09/2015 showed EF of 55 to 60% with grade 1 diastolic dysfunction.  Patient has 1+ leg edema, no shortness of breath.   BNP is 117, does not seem to have CHF exacerbation.   Will hold off diuretics due to SIRS. -Watch volume status closely 3/27 Lasix 40 mg IV one-time dose given  # Diarrhea: Negative C. Difficile Diarrhea improving, started probiotics. 3/25 GI pathogen negative 3/26 diarrhea improving  # Metabolic acidosis: most likely due to diarrhea, resolved s/p bicarbonate oral supplement.   # Alcohol dependence, daily use  -Patient stopped drinking alcohol since February   # Diabetes mellitus without complication: Recent A1c 6.9, well-controlled.  Patient is taking Lantus 16 units daily -Sliding scale insulin   # Depression -Continue home medications   # Protein-calorie malnutrition, moderate: Body weight 49.1 kg, BMI 19.17 -Ensure -Nutrition consult     Body mass index is 20.62 kg/m.  Interventions:  Diet: Diabetic diet DVT Prophylaxis: Subcutaneous Lovenox   Advance goals of care discussion: Full code  Family Communication: family was present at bedside, at the time of interview.  The pt provided permission to discuss medical plan with the family. Opportunity was given to ask question and all questions were answered satisfactorily.   Disposition:  Pt is from home, admitted with SIRS, diarrhea, hyponatremia, still has low sodium on IV antibiotics, which precludes a safe discharge. Discharge to home, when stable, most likely in 2-3 days when sodium level improves   Subjective: No significant events overnight, complaining of  feeling bloated, abdominal distention most likely due to ascites and lower extremity edema.  Diarrhea is improving, patient had 1 BM which is soft stool.  Denied any fever or chills.  Physical Exam: General: NAD, lying comfortably Appear in no distress, affect appropriate Eyes: PERRLA ENT: Oral Mucosa Clear, moist  Neck: no JVD,  Cardiovascular: S1 and S2 Present, no Murmur,  Respiratory: good respiratory effort, Bilateral Air entry equal and Decreased, no Crackles, no wheezes Abdomen: Bowel Sound present, distended but soft and no tenderness,  Skin: no rashes Extremities: 2+ Pedal edema, no calf tenderness Neurologic: without any new focal findings Gait not checked due to patient safety concerns  Vitals:   03/13/24 1642 03/13/24 2100 03/14/24 0500 03/14/24 0826  BP: (!) 141/69 (!) 166/67  128/61  Pulse: 86 97  89  Resp: 18 16  16   Temp: 98 F (36.7 C) 99.4 F (37.4 C)    TempSrc: Oral Oral    SpO2: 97% 95%  98%  Weight:   52.8 kg   Height:        Intake/Output Summary (Last 24 hours) at 03/14/2024 1627 Last data filed at 03/14/2024 1547 Gross per 24 hour  Intake 993.25 ml  Output --  Net 993.25 ml   Filed Weights   03/12/24 0500 03/13/24 0606 03/14/24 0500  Weight: 50.8 kg 50.6 kg 52.8 kg    Data Reviewed: I have personally reviewed  and interpreted daily labs, tele strips, imagings as discussed above. I reviewed all nursing notes, pharmacy notes, vitals, pertinent old records I have discussed plan of care as described above with RN and patient/family.  CBC: Recent Labs  Lab 03/08/24 1321 03/09/24 0433 03/10/24 0507 03/11/24 0658 03/12/24 0555 03/13/24 0530 03/14/24 0530  WBC 23.7*   < > 9.9 7.9 4.5  4.4 2.4* 1.3*  NEUTROABS 21.8*  --   --   --  3.8 1.6* 0.6*  HGB 10.7*   < > 9.8* 8.9* 8.5*  8.5* 7.9* 7.2*  HCT 30.7*   < > 27.7* 24.8* 24.1*  23.8* 22.4* 20.6*  MCV 84.3   < > 81.5 83.2 82.5  82.1 83.6 83.7  PLT 155   < > 101* 56* 48*  47* 57* 86*   < >  = values in this interval not displayed.   Basic Metabolic Panel: Recent Labs  Lab 03/10/24 0507 03/11/24 0658 03/12/24 0555 03/13/24 0530 03/14/24 0530  NA 126* 127* 130* 130* 130*  K 3.4* 3.4* 3.1* 3.8 3.6  CL 98 99 100 105 103  CO2 21* 22 22 21* 22  GLUCOSE 86 141* 96 97 89  BUN 22 15 11 11 11   CREATININE 0.55 0.53 0.38* 0.50 0.55  CALCIUM 7.0* 6.9* 7.0* 6.9* 6.9*  MG 1.9 1.8 1.8 1.8 1.6*  PHOS 2.5 2.4* 2.2* 1.7* 2.4*    Studies: No results found.   Scheduled Meds:  Chlorhexidine Gluconate Cloth  6 each Topical Daily   cholecalciferol  5,000 Units Oral Daily   feeding supplement  1 Container Oral TID BM   fenofibrate  160 mg Oral Daily   filgrastim  300 mcg Subcutaneous Daily   folic acid  1 mg Oral Daily   insulin aspart  0-5 Units Subcutaneous QHS   insulin aspart  0-9 Units Subcutaneous TID WC   insulin glargine-yfgn  8 Units Subcutaneous Daily   latanoprost  1 drop Both Eyes QHS   multivitamin with minerals  1 tablet Oral Daily   nicotine  21 mg Transdermal Daily   phosphorus  500 mg Oral TID   saccharomyces boulardii  250 mg Oral BID   sodium chloride  2 g Oral TID WC   thiamine  100 mg Oral Daily   Continuous Infusions:  piperacillin-tazobactam (ZOSYN)  IV 12.5 mL/hr at 03/14/24 1547   vancomycin Stopped (03/14/24 1013)   PRN Meds: acetaminophen, fluticasone, ondansetron (ZOFRAN) IV, oxyCODONE  Time spent: 35 minutes  Author: Gillis Santa. MD [facility] 03/14/2024 4:27 PM  To reach On-call, see care teams to locate the attending and reach out to them via [email or web address]. If 7PM-7AM, please contact night-coverage If you still have difficulty reaching the attending provider, please page the [facility] (Director on Call) for [facility] on amion for assistance.

## 2024-03-14 NOTE — Inpatient Diabetes Management (Signed)
 Inpatient Diabetes Program Recommendations  AACE/ADA: New Consensus Statement on Inpatient Glycemic Control (2015)  Target Ranges:  Prepandial:   less than 140 mg/dL      Peak postprandial:   less than 180 mg/dL (1-2 hours)      Critically ill patients:  140 - 180 mg/dL    Latest Reference Range & Units 02/09/24 13:00  Hemoglobin A1C 4.8 - 5.6 % 6.9 (H)  (H): Data is abnormally high  Latest Reference Range & Units 03/13/24 07:40 03/13/24 11:58 03/13/24 16:55 03/13/24 21:37  Glucose-Capillary 70 - 99 mg/dL 454 (H)  1 unit Novolog  8 units Semglee  122 (H)  1 unit Novolog  96 96  (H): Data is abnormally high  Latest Reference Range & Units 03/14/24 08:26  Glucose-Capillary 70 - 99 mg/dL 63 (L)  (L): Data is abnormally low   Admit with: SIRS/ Hyponatremia/ Hypokalemia  History: DM, Stage IV pancreatic cancer on chemotherapy   Home DM Meds: Freestyle Libre 3 CGM       Decadron 8 mg daily the day after Chemo for 3 days       Lantus 18 units QHS  Current Orders: Semglee 8 units daily        Novolog Sensitive Correction Scale/ SSI (0-9 units) TID AC + HS     MD- Note Hypoglycemia this AM  Please reduce Semglee to 5 units daily    --Will follow patient during hospitalization--  Ambrose Finland RN, MSN, CDCES Diabetes Coordinator Inpatient Glycemic Control Team Team Pager: [phone] (8a-5p)

## 2024-03-14 NOTE — Progress Notes (Signed)
 Nutrition Follow-up  DOCUMENTATION CODES:   Non-severe (moderate) malnutrition in context of chronic illness  INTERVENTION:   -Continue regular diet -Continue Boost Breeze po TID, each supplement provides 250 kcal and 9 grams of protein  -Continue MVI with minerals daily -Continue 100 mg thiamine daily -Continue 1 mg folic acid daily -Educated pt on importance of good meal and supplement intake, small, frequent meals, and modifying foods of ease of intake  NUTRITION DIAGNOSIS:   Moderate Malnutrition related to chronic illness (stage IV pancreatic cancer) as evidenced by mild fat depletion, moderate fat depletion, mild muscle depletion, moderate muscle depletion, percent weight loss.  Ongoing  GOAL:   Patient will meet greater than or equal to 90% of their needs  Progressing   MONITOR:   PO intake, Supplement acceptance  REASON FOR ASSESSMENT:   Consult Assessment of nutrition requirement/status  ASSESSMENT:   73 y.o. female with medical history significant of recently diagnosed invasive stage IV pancreatic cancer on chemotherapy, breast cancer s/p mastectomy 2016, HTN, HLD, DM, dCHF, hypothyroidism, depression, lupus, etoh abuse and GERD who is admitted with SIRS, diarrhea, fever and hyponatremia.  Reviewed I/O's: +620 ml x 24 hours and +3.7 L since admission  Spoke with pt and daughter at bedside. Pt shares that her appetite has improved. She shares that she has experienced a general decline in health over the past 6 months, which she initially attributes to grief from losing multiple family members (not having an appetite and weight loss secondary to this). Pt reports that she has also has poor oral intake secondary to side effects from chemotherapy treatments, such as intolerance to cold foods, which has improved. Pt reports that she has been able to consume Gatorade and cereal and is very happy about that. She consumed a biscuit for breakfast this morning.    Pt  daughter shares that pt has ill fitting dentures, which worsened secondary to weight loss. Pt has no upper teeth, which it makes it difficult to chew some foods, particularly meat. Pt is also self conscious about people watching her eat due to this. Offered to downgrade diet for  ease of intake, however, pt politely declined, stating that she is a selective eater and would like the widest variety of meal selections. Pt daughter has also been bringing in outside food and [employer] drinks. She is willing to try Boost Breeze again as cold tolerance has improved.   No wt has increased since admission, however, pt with moderate edema to legs, which is likely contributing to weight gain. Pt reports she has lost 40 pounds over the past 6 months. Reviewed wt hx; pt has experienced a 13.4% wt loss over the past month, which is significant for time frame.   Discussed importance of good meal and supplement intake to promote healing. RD discussed liberalized diet and small, frequent meals during poor oral intake. Provided education on how to modify favorite foods for ease of intake, such as adding sauces and gravies.   Medications reviewed and include fenofibate, folic acid, phosphorus, florastor, sodium chloride, and thiamine.   Lab Results  Component Value Date   HGBA1C 6.9 (H) 02/09/2024   PTA DM medications are none.   Labs reviewed: Na: 130, Phos: 2.4, Mg: 1.6, CBGS: 63-131 (inpatient orders for glycemic control are 0-5 units insulin aspart daily at bedtime, 0-9 units insulin aspart TID with meals, and 8 units insulin glargine-yfgn daily).    NUTRITION - FOCUSED PHYSICAL EXAM:  Flowsheet Row Most Recent Value  Orbital Region  Mild depletion  Upper Arm Region Mild depletion  Thoracic and Lumbar Region No depletion  Buccal Region Mild depletion  Temple Region Mild depletion  Clavicle Bone Region Mild depletion  Clavicle and Acromion Bone Region Mild depletion  Scapular Bone Region Mild depletion   Dorsal Hand Mild depletion  Patellar Region No depletion  Anterior Thigh Region No depletion  Posterior Calf Region No depletion  Edema (RD Assessment) Moderate  Hair Reviewed  Eyes Reviewed  Mouth Reviewed  Skin Reviewed  Nails Reviewed       Diet Order:   Diet Order             Diet regular Room service appropriate? Yes; Fluid consistency: Thin; Fluid restriction: 1500 mL Fluid  Diet effective now                   EDUCATION NEEDS:   Education needs have been addressed  Skin:  Skin Assessment: Reviewed RN Assessment  Last BM:  03/12/24  Height:   Ht Readings from Last 1 Encounters:  03/08/24 5\' 3"  (1.6 m)    Weight:   Wt Readings from Last 1 Encounters:  03/14/24 52.8 kg    Ideal Body Weight:  52.2 kg  BMI:  Body mass index is 20.62 kg/m.  Estimated Nutritional Needs:   Kcal:  1650-1850  Protein:  90-105 grams  Fluid:  > 1.6 L    Levada Schilling, RD, LDN, CDCES Registered Dietitian III Certified Diabetes Care and Education Specialist If unable to reach this RD, please use "RD Inpatient" group chat on secure chat between hours of 8am-4 pm daily

## 2024-03-14 NOTE — Care Management Important Message (Signed)
 Important Message  Patient Details  Name: Heather Burke MRN: [id number] Date of Birth: 05/14/1951   Important Message Given:  Yes - Medicare IM     Masayoshi Couzens, Stephan Minister 03/14/2024, 2:49 PM

## 2024-03-15 DIAGNOSIS — D709 Neutropenia, unspecified: Secondary | ICD-10-CM | POA: Diagnosis not present

## 2024-03-15 DIAGNOSIS — R5081 Fever presenting with conditions classified elsewhere: Secondary | ICD-10-CM | POA: Diagnosis not present

## 2024-03-15 DIAGNOSIS — R6510 Systemic inflammatory response syndrome (SIRS) of non-infectious origin without acute organ dysfunction: Secondary | ICD-10-CM | POA: Diagnosis not present

## 2024-03-15 DIAGNOSIS — C257 Malignant neoplasm of other parts of pancreas: Secondary | ICD-10-CM | POA: Diagnosis not present

## 2024-03-15 LAB — CBC WITH DIFFERENTIAL/PLATELET
Abs Immature Granulocytes: 0.11 10*3/uL — ABNORMAL HIGH (ref 0.00–0.07)
Basophils Absolute: 0 10*3/uL (ref 0.0–0.1)
Basophils Relative: 1 %
Eosinophils Absolute: 0 10*3/uL (ref 0.0–0.5)
Eosinophils Relative: 1 %
HCT: 19.3 % — ABNORMAL LOW (ref 36.0–46.0)
Hemoglobin: 6.7 g/dL — ABNORMAL LOW (ref 12.0–15.0)
Immature Granulocytes: 10 %
Lymphocytes Relative: 52 %
Lymphs Abs: 0.6 10*3/uL — ABNORMAL LOW (ref 0.7–4.0)
MCH: 28.8 pg (ref 26.0–34.0)
MCHC: 34.7 g/dL (ref 30.0–36.0)
MCV: 82.8 fL (ref 80.0–100.0)
Monocytes Absolute: 0.2 10*3/uL (ref 0.1–1.0)
Monocytes Relative: 20 %
Neutro Abs: 0.2 10*3/uL — CL (ref 1.7–7.7)
Neutrophils Relative %: 16 %
Platelets: 112 10*3/uL — ABNORMAL LOW (ref 150–400)
RBC: 2.33 MIL/uL — ABNORMAL LOW (ref 3.87–5.11)
RDW: 14.6 % (ref 11.5–15.5)
Smear Review: NORMAL
WBC: 1.1 10*3/uL — CL (ref 4.0–10.5)
nRBC: 0 % (ref 0.0–0.2)

## 2024-03-15 LAB — CULTURE, BLOOD (SINGLE): Special Requests: ADEQUATE

## 2024-03-15 LAB — PHOSPHORUS: Phosphorus: 2.5 mg/dL (ref 2.5–4.6)

## 2024-03-15 LAB — HEPATIC FUNCTION PANEL
ALT: 10 U/L (ref 0–44)
AST: 29 U/L (ref 15–41)
Albumin: <1.5 g/dL — ABNORMAL LOW (ref 3.5–5.0)
Alkaline Phosphatase: 111 U/L (ref 38–126)
Bilirubin, Direct: 0.6 mg/dL — ABNORMAL HIGH (ref 0.0–0.2)
Indirect Bilirubin: 0.2 mg/dL — ABNORMAL LOW (ref 0.3–0.9)
Total Bilirubin: 0.8 mg/dL (ref 0.0–1.2)
Total Protein: 4.2 g/dL — ABNORMAL LOW (ref 6.5–8.1)

## 2024-03-15 LAB — GLUCOSE, CAPILLARY
Glucose-Capillary: 92 mg/dL (ref 70–99)
Glucose-Capillary: 155 mg/dL — ABNORMAL HIGH (ref 70–99)
Glucose-Capillary: 163 mg/dL — ABNORMAL HIGH (ref 70–99)
Glucose-Capillary: 101 mg/dL — ABNORMAL HIGH (ref 70–99)

## 2024-03-15 LAB — ABO/RH: ABO/RH(D): A POS

## 2024-03-15 LAB — PREPARE RBC (CROSSMATCH)

## 2024-03-15 LAB — BASIC METABOLIC PANEL WITH GFR
Anion gap: 7 (ref 5–15)
BUN: 12 mg/dL (ref 8–23)
CO2: 24 mmol/L (ref 22–32)
Calcium: 6.8 mg/dL — ABNORMAL LOW (ref 8.9–10.3)
Chloride: 100 mmol/L (ref 98–111)
Creatinine, Ser: 0.58 mg/dL (ref 0.44–1.00)
GFR, Estimated: >60 mL/min (ref >60)
Glucose, Bld: 110 mg/dL — ABNORMAL HIGH (ref 70–99)
Potassium: 3.2 mmol/L — ABNORMAL LOW (ref 3.5–5.1)
Sodium: 131 mmol/L — ABNORMAL LOW (ref 135–145)

## 2024-03-15 LAB — PATHOLOGIST SMEAR REVIEW

## 2024-03-15 LAB — MAGNESIUM: Magnesium: 1.9 mg/dL (ref 1.7–2.4)

## 2024-03-15 MED ORDER — FILGRASTIM-AAFI 300 MCG/0.5ML IJ SOSY
300.0000 ug | PREFILLED_SYRINGE | Freq: Every day | INTRAMUSCULAR | Status: DC
  Administered 2024-03-15 – 2024-03-16 (×2): 300 ug via SUBCUTANEOUS
  Filled 2024-03-15 (×3): qty 0.5

## 2024-03-15 MED ORDER — FUROSEMIDE 10 MG/ML IJ SOLN
40.0000 mg | Freq: Two times a day (BID) | INTRAMUSCULAR | Status: DC
  Administered 2024-03-15 – 2024-03-16 (×3): 40 mg via INTRAVENOUS
  Filled 2024-03-15 (×3): qty 4

## 2024-03-15 MED ORDER — MELATONIN 5 MG PO TABS: 5.0000 mg | ORAL_TABLET | Freq: Once | ORAL | Status: DC

## 2024-03-15 MED ORDER — POTASSIUM CHLORIDE CRYS ER 20 MEQ PO TBCR
40.0000 meq | EXTENDED_RELEASE_TABLET | ORAL | Status: AC
  Administered 2024-03-15 (×3): 40 meq via ORAL
  Filled 2024-03-15 (×3): qty 2

## 2024-03-15 MED ORDER — SODIUM CHLORIDE 0.9% IV SOLUTION
Freq: Once | INTRAVENOUS | Status: DC
Start: 2024-03-15 — End: 2024-03-16

## 2024-03-15 NOTE — Progress Notes (Signed)
 [facility] Progress Note  Patient: Heather Burke    ZOX:[id number]  DOA: 03/08/2024     Date of Service: the patient was seen and examined on 03/15/2024  Chief Complaint  Patient presents with   Fever   Brief hospital course: Heather Burke is a 73 y.o. female with medical history significant of recently diagnosed invasive stage IV pancreatic cancer on chemotherapy, breast cancer 2016, HTN, HLD, DM, dCHF, hypothyroidism, depression, who presents with intermittent fever.   Patient states that she has intermittent fever for almost 3 weeks.  Last night she had fever 102.0 with chills.  No chest pain, cough, SOB.  Patient does not have nausea vomiting or abdominal pain.  Patient has intermittent diarrhea and is taking Imodium as needed.  No symptoms of UTI.  Patient has been taking Levaquin prescribed by Heather Burke of oncology since 3/18 without improvement.  She had first dose of chemotherapy on 3/18 per patient.  She has generalized weakness, poor appetite and decreased oral intake. Pt was seen in cancer center and found to have hypothermia, and had blood culture drawn,  and was sent to ED for further evaluation and treatment.  Patient states that she stopped drinking alcohol since February.     Data reviewed independently and ED Course: pt was found to have WBC 23.7, sodium 120, GFR > 60, potassium 4.4, magnesium 1.8, phosphorus 3.0, urinalysis (clear appearance, trace amount of leukocyte, negative bacteria, WBC 6-10), negative PCR for COVID, flu and RSV, lactic acid 1.6, procalcitonin 3.50.  Temperature 100.2 --> 94.2, initial blood pressure 71/30 which improved to 119/53, heart rate 56, 20, oxygen saturation 96-99% on room air.  Chest x-ray negative for infiltration.  Patient is admitted to telemetry bed as inpatient.     EKG: I have personally reviewed.  Sinus rhythm, QTc 423, low voltage.   Assessment and Plan:  # SIRS (systemic inflammatory response syndrome) Heather Burke): Patient meets  criteria for SIRS with WBC 23.7 and hypothermia with temperature 94.2.  Initially hypotensive, which improved to 119/53 before giving IVF.   No clear source of infection identified so far, will treat patient as SIRS.  If source of infection is identified, will change to sepsis.   Lactic acid is normal.  Procalcitonin 3.50.  Chest x-ray negative for infiltration.  UA showed trace amount of leukocyte with WBC 6-10, but patient denies symptoms of UTI. -Continue vancomycin plus Zosyn (patient received 1 dose of cefepime and Flagyl in ED) -f/u Blood culture -IV fluid: 500 cc normal saline, s/p NS 75 cc/h -s/p 100 mg of solucortef as stress dose 3/25 patient spiked fever 101.4, WBC 4.4 and procalcitonin 3.5-->1.47 trended down.  Patient stated that she was wrapped in warm blankets that may be the reason of fever, does not want blood culture to be drawn. Continued antibiotics for now 3/28 follow ID consult   # Malignant neoplasm of other parts of pancreas: Patient had 1 dose of chemotherapy -Follow-up with Heather Burke, oncologist 3/26 Leukopenia and Neutropenia, oncology started filgrastim 300 mcg subcu daily from 3/27 x 4 doses 3/28 WBC 1.1 and ANC 0.2     # Pancytopenia most likely due to chemotherapy induced bone marrow suppression Continue filgrastim as above 3/28 Hb 6.7, transfused 1 unit of PRBC today Monitor H&H and transfuse if hemoglobin less than 7  #Volume overload, bilateral lower extremity edema and ascites Possible secondary to pancreatic cancer Follow 2D echocardiogram 3/27 Lasix 40 mg one-time dose given 3/28 started Lasix 40 mg IV  twice daily, monitor volume status and electrolytes   # Hypotonic hyponatremia: Sodium 120, likely multifactorial etiology, including poor oral intake, Diovan/hydrochlorothiazide and  dehydration.  Mental status normal. Serum osmolality 270, low Continue fluid restriction S/p NS bolus and maintenance, no more IV fluid Started Salt Tabs 2 g p.o. 3  times daily Monitor sodium level - avoid over correction too fast due to risk of central pontine myelinolysis -Hold Diovan-HCTZ 3/28 Na 131 today   # Hypomagnesemia, mag repleted. # Hypokalemia due to diarrhea, potassium repleted.  Resolved # Hypophosphatemia, Phos repleted. Monitor electrolytes and replete as needed.   # Hypertension associated with diabetes -Hold blood pressure medications (diltiazem, metoprolol, Diovan-HCTZ) due to softer blood pressure   # Hypothyroidism: Synthroid # Hyperlipemia: Tricor # Chronic diastolic CHF (congestive heart failure): 2D echo on 10/09/2015 showed EF of 55 to 60% with grade 1 diastolic dysfunction.  Patient has 1+ leg edema, no shortness of breath.   BNP is 117, does not seem to have CHF exacerbation.   Will hold off diuretics due to SIRS. -Watch volume status closely 3/27 Lasix 40 mg IV one-time dose given  # Diarrhea: Negative C. Difficile Diarrhea improving, started probiotics. 3/25 GI pathogen negative 3/26 diarrhea improving  # Metabolic acidosis: most likely due to diarrhea, resolved s/p bicarbonate oral supplement.   # Alcohol dependence, daily use  -Patient stopped drinking alcohol since February   # Diabetes mellitus without complication: Recent A1c 6.9, well-controlled.  Patient is taking Lantus 16 units daily -Sliding scale insulin   # Depression -Continue home medications   # Protein-calorie malnutrition, moderate: Body weight 49.1 kg, BMI 19.17 -Ensure -Nutrition consult     Body mass index is 28.47 kg/m.  Interventions:  Diet: Diabetic diet DVT Prophylaxis: Subcutaneous Lovenox   Advance goals of care discussion: Full code  Family Communication: family was present at bedside, at the time of interview.  The pt provided permission to discuss medical plan with the family. Opportunity was given to ask question and all questions were answered satisfactorily.   Disposition:  Pt is from home, admitted with SIRS,  diarrhea, hyponatremia, still has low sodium on IV antibiotics, which precludes a safe discharge. Discharge to home, when stable, most likely in 2-3 days when sodium level improves   Subjective: No significant events overnight, did move bowels x 2, soft stool, no diarrhea.  Denied any other complaints. Patient is making enough urine after Lasix.  Advised to use pure wick during blood transfusion.  Physical Exam: General: NAD, lying comfortably Appear in no distress, affect appropriate Eyes: PERRLA ENT: Oral Mucosa Clear, moist  Neck: no JVD,  Cardiovascular: S1 and S2 Present, no Murmur,  Respiratory: good respiratory effort, Bilateral Air entry equal and Decreased, no Crackles, no wheezes Abdomen: Bowel Sound present, distended but soft and no tenderness,  Skin: no rashes Extremities: 4+ Pedal edema, no calf tenderness Neurologic: without any new focal findings Gait not checked due to patient safety concerns  Vitals:   03/15/24 0500 03/15/24 0554 03/15/24 0826 03/15/24 1145  BP:  (!) 109/51 (!) 115/58 110/62  Pulse:  78 78 82  Resp:  18 16 18   Temp:  (!) 97.5 F (36.4 C) 97.7 F (36.5 C) 98.2 F (36.8 C)  TempSrc:  Oral  Oral  SpO2:  95% 95% 96%  Weight: 72.9 kg     Height:        Intake/Output Summary (Last 24 hours) at 03/15/2024 1235 Last data filed at 03/15/2024 1150 Gross per  24 hour  Intake 1188.25 ml  Output 5 ml  Net 1183.25 ml   Filed Weights   03/13/24 0606 03/14/24 0500 03/15/24 0500  Weight: 50.6 kg 52.8 kg 72.9 kg    Data Reviewed: I have personally reviewed and interpreted daily labs, tele strips, imagings as discussed above. I reviewed all nursing notes, pharmacy notes, vitals, pertinent old records I have discussed plan of care as described above with RN and patient/family.  CBC: Recent Labs  Lab 03/12/24 0555 03/13/24 0530 03/14/24 0530 03/14/24 1628 03/15/24 0500  WBC 4.5  4.4 2.4* 1.3* 1.4* 1.1*  NEUTROABS 3.8 1.6* 0.6* 0.5* 0.2*  HGB  8.5*  8.5* 7.9* 7.2* 7.4* 6.7*  HCT 24.1*  23.8* 22.4* 20.6* 21.3* 19.3*  MCV 82.5  82.1 83.6 83.7 84.5 82.8  PLT 48*  47* 57* 86* 102* 112*   Basic Metabolic Panel: Recent Labs  Lab 03/11/24 0658 03/12/24 0555 03/13/24 0530 03/14/24 0530 03/15/24 0500  NA 127* 130* 130* 130* 131*  K 3.4* 3.1* 3.8 3.6 3.2*  CL 99 100 105 103 100  CO2 22 22 21* 22 24  GLUCOSE 141* 96 97 89 110*  BUN 15 11 11 11 12   CREATININE 0.53 0.38* 0.50 0.55 0.58  CALCIUM 6.9* 7.0* 6.9* 6.9* 6.8*  MG 1.8 1.8 1.8 1.6* 1.9  PHOS 2.4* 2.2* 1.7* 2.4* 2.5    Studies: No results found.   Scheduled Meds:  sodium chloride   Intravenous Once   Chlorhexidine Gluconate Cloth  6 each Topical Daily   cholecalciferol  5,000 Units Oral Daily   feeding supplement  1 Container Oral TID BM   fenofibrate  160 mg Oral Daily   filgrastim (NIVESTYM) SQ  300 mcg Subcutaneous q1800   folic acid  1 mg Oral Daily   furosemide  40 mg Intravenous BID   insulin aspart  0-5 Units Subcutaneous QHS   insulin aspart  0-9 Units Subcutaneous TID WC   insulin glargine-yfgn  8 Units Subcutaneous Daily   latanoprost  1 drop Both Eyes QHS   multivitamin with minerals  1 tablet Oral Daily   nicotine  21 mg Transdermal Daily   potassium chloride  40 mEq Oral Q4H   saccharomyces boulardii  250 mg Oral BID   sodium chloride  2 g Oral TID WC   thiamine  100 mg Oral Daily   Continuous Infusions:  piperacillin-tazobactam (ZOSYN)  IV 3.375 g (03/15/24 0556)   vancomycin 1,000 mg (03/15/24 0922)   PRN Meds: acetaminophen, fluticasone, ondansetron (ZOFRAN) IV, oxyCODONE  Time spent: 55 minutes  Author: Gillis Santa. MD [facility] 03/15/2024 12:35 PM  To reach On-call, see care teams to locate the attending and reach out to them via [email or web address]. If 7PM-7AM, please contact night-coverage If you still have difficulty reaching the attending provider, please page the [facility] ([facility]) (Director on Call) for [facility] on amion for  assistance.

## 2024-03-15 NOTE — Plan of Care (Signed)
 Dr. Informed of critical lab values:  1.1 WBC, 0.2 Absolute Neutrophils, 3.2 potassium and 6.7 hemoglobin.

## 2024-03-15 NOTE — Consult Note (Signed)
 NAME: Heather Burke  DOB: 10-Oct-1951  MRN: [id number]  Date/Time: 03/15/2024 1:37 PM  REQUESTING PROVIDER: Dr. Lucianne Muss Subjective:  REASON FOR CONSULT: Neutropenic fever ? Heather Burke is a 73 y.o. with a history of h/o ca breast recently diagnosed ca pancreas with mets to liver, lymphnodes ,, and has taken her 1st dose of chemo  Folfiori on 3/18 presented from cancer center to the ED on 3/21 for dizziness, low BP and fever at night This fever has been going on for a a few months- She has chills and then feels hot at nigh, whenever she checked temp it was low grade with occasional fever of 102   03/08/24 14:48  BP 107/66  Temp 94.2 F (34.6 C) !  Pulse Rate 56 !  Resp 17  SpO2 98 %    Latest Reference Range & Units 03/08/24 13:21  WBC 4.0 - 10.5 K/uL 23.7 (H)  Hemoglobin 12.0 - 15.0 g/dL 40.9 (L)  HCT 81.1 - 91.4 % 30.7 (L)  Platelets 150 - 400 K/uL 155  Creatinine 0.44 - 1.00 mg/dL 7.82    Blood culture sent and she was started on vanco and zosyn   She had intermittent fever And there was a plan intially to discharge her on Monday but then her wbc started to drop. She got filgrastim aafi yesterday  I am asked to see her to give recommendation about antibioitc Pt and daughter at bed side Pt says she is tired of being in the hospital She c/o distended abdomen and legs She has a baseline cough smokers but not worse No headache No diarrhea No dysuria She is now getting PRBC No travel No pets No animal bites Port placed on 3/10  Past Medical History:  Diagnosis Date   Breast cancer (HCC) 06/08/2015   T1c, N0;  ER+,PR+, Her 2 neg (FISH neg) x2., SLN x 3 negative. Mammoprint: Low risk. Multifocal.Invasive mammary and lobular carcinoma.    Diabetes mellitus without complication (HCC)    GERD (gastroesophageal reflux disease)    Heart murmur 2016   newly diagnosed, slight murmur   Hyperlipidemia    Hypertension    Hypothyroidism    Lupus    PONV (postoperative  nausea and vomiting)    with Hysterectomy   Port-A-Cath in place    Thyroid disease     Past Surgical History:  Procedure Laterality Date   ABDOMINAL HYSTERECTOMY     BREAST BIOPSY Right 2002   negative/ Dr. Lemar Livings   BREAST BIOPSY Left 06-08-15   INVASIVE MAMMARY CARCINOMA WITH PARTIAL SOLID PATTERN.    BREAST BIOPSY Right 06/26/2018   FIBROADENOMATOUS CHANGES WITH STROMAL HYALINIZATION AND CALCIFICATIONS   COLONOSCOPY WITH PROPOFOL N/A 09/13/2016   Procedure: COLONOSCOPY WITH PROPOFOL;  Surgeon: Midge Minium, MD;  Location: [facility];  Service: Endoscopy;  Laterality: N/A;   core biopsy Left 06/08/15   GUM SURGERY  2006   IR IMAGING GUIDED PORT INSERTION  02/28/2024   MASTECTOMY Left 2016   OOPHORECTOMY     SENTINEL NODE BIOPSY Left 08/18/2015   Procedure: SENTINEL NODE BIOPSY;  Surgeon: Earline Mayotte, MD;  Location: [facility] ORS;  Service: General;  Laterality: Left;   SIMPLE MASTECTOMY WITH AXILLARY SENTINEL NODE BIOPSY Left 08/18/2015   Procedure: SIMPLE MASTECTOMY;  Surgeon: Earline Mayotte, MD;  Location: [facility] ORS;  Service: General;  Laterality: Left;   TONSILLECTOMY      Social History   Socioeconomic History   Marital status: Widowed  Spouse name: Not on file   Number of children: 2   Years of education: H/S   Highest education level: 12th grade  Occupational History   Occupation: Part-Time    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 PT states no beer since 02/10/24   Drug use: No   Sexual  activity: Not on file  Other Topics Concern   Not on file  Social History Narrative   Not on file   Social Drivers of Health   Financial Resource Strain: Low Risk  (12/26/2023)   Overall Financial Resource Strain (CARDIA)    Difficulty of Paying Living Expenses: Not hard at all  Food Insecurity: No Food Insecurity (03/08/2024)   Hunger Vital Sign    Worried About Running Out of Food in the Last Year: Never true    Ran Out of Food in the Last Year: Never true  Transportation Needs: No Transportation Needs (03/08/2024)   PRAPARE - Administrator, Civil Service (Medical): No    Lack of Transportation (Non-Medical): No  Physical Activity: Insufficiently Active (12/26/2023)   Exercise Vital Sign    Days of Exercise per Week: 4 days    Minutes of Exercise per Session: 30 min  Stress: No Stress Concern Present (12/26/2023)   [employer] of Occupational Health - Occupational Stress Questionnaire    Feeling of Stress : Not at all  Social Connections: Moderately Isolated (03/08/2024)   Social Connection and Isolation Panel [NHANES]    Frequency of Communication with Friends and Family: More than three times a week    Frequency of Social Gatherings with Friends and Family: More than three times a week    Attends Religious Services: 1 to 4 times per year    Active Member of [employer] or Organizations: No    Attends Banker Meetings: Never    Marital Status: Widowed  Intimate Partner Violence: Not At Risk (03/08/2024)   Humiliation, Afraid, Rape, and Kick questionnaire    Fear of Current or Ex-Partner: No    Emotionally Abused: No    Physically Abused: No    Sexually Abused: No    Family History  Problem Relation Age of Onset   Cancer Father        prostate   Thyroid disease Daughter    Heart attack Sister    Breast cancer Cousin    Breast cancer Cousin    Breast cancer Other    Allergies  Allergen Reactions   Metformin And Related Diarrhea    SEVERE DIARRHEA    Amlodipine Nausea Only and Other (See Comments)    Stomach cramping   Atorvastatin Nausea And Vomiting   Lisinopril Cough   Pravastatin Nausea And Vomiting   Icosapent Ethyl (Epa Ethyl Ester) (Fish) Palpitations   Nifedipine Er Other (See Comments)    headache   I? Current Facility-Administered Medications  Medication Dose Route Frequency  Provider Last Rate Last Admin   0.9 %  sodium chloride infusion (Manually program via Guardrails IV Fluids)   Intravenous Once Gillis Santa, MD       acetaminophen (TYLENOL) tablet 650 mg  650 mg Oral Q6H PRN Lorretta Harp, MD   650 mg at 03/14/24 2126   Chlorhexidine Gluconate Cloth 2 % PADS 6 each  6 each Topical Daily Gillis Santa, MD   6 each at 03/15/24 8469   cholecalciferol (VITAMIN D3) 25 MCG (1000 UNIT) tablet 5,000 Units  5,000 Units Oral Daily Lorretta Harp, MD   5,000 Units at 03/15/24 6295   feeding supplement (BOOST / RESOURCE BREEZE) liquid 1 Container  1 Container Oral TID BM Gillis Santa, MD   1 Container at 03/15/24 0934   fenofibrate tablet 160 mg  160 mg Oral Daily Lorretta Harp, MD   160 mg at 03/15/24 2841   filgrastim-aafi (NIVESTYM) injection 300 mcg  300 mcg Subcutaneous q1800 [name], RPH       fluticasone (FLONASE) 50 MCG/ACT nasal spray 1 spray  1 spray Each Nare BID PRN Gillis Santa, MD   1 spray at 03/10/24 2041   folic acid (FOLVITE) tablet 1 mg  1 mg Oral Daily Gillis Santa, MD   1 mg at 03/15/24 3244   furosemide (LASIX) injection 40 mg  40 mg Intravenous BID Gillis Santa, MD   40 mg at 03/15/24 1207   insulin aspart (novoLOG) injection 0-5 Units  0-5 Units Subcutaneous QHS Lorretta Harp, MD       insulin aspart (novoLOG) injection 0-9 Units  0-9 Units Subcutaneous TID WC Lorretta Harp, MD   2 Units at 03/14/24 1642   insulin glargine-yfgn [facility]) injection 8 Units  8 Units Subcutaneous Daily Gillis Santa, MD   8 Units at 03/15/24 0925   latanoprost (XALATAN) 0.005 % ophthalmic solution 1 drop  1 drop Both Eyes QHS  Gillis Santa, MD   1 drop at 03/14/24 2129   multivitamin with minerals tablet 1 tablet  1 tablet Oral Daily Gillis Santa, MD   1 tablet at 03/15/24 1207   nicotine (NICODERM CQ - dosed in mg/24 hours) patch 21 mg  21 mg Transdermal Daily Lorretta Harp, MD   21 mg at 03/15/24 0924   ondansetron (ZOFRAN) injection 4 mg  4 mg Intravenous Q8H PRN Lorretta Harp, MD       oxyCODONE (Oxy IR/ROXICODONE) immediate release tablet 5 mg  5 mg Oral Q4H PRN Lorretta Harp, MD       piperacillin-tazobactam (ZOSYN) IVPB 3.375 g  3.375 g Intravenous Q8H Gillis Santa, MD 12.5 mL/hr at 03/15/24 0556 3.375 g at 03/15/24 0556   potassium chloride SA (KLOR-CON M) CR tablet 40 mEq  40 mEq Oral Q4H Gillis Santa, MD   40 mEq at 03/15/24 1207   saccharomyces boulardii (FLORASTOR) capsule 250 mg  250 mg Oral BID Gillis Santa, MD   250 mg at 03/15/24 0102   sodium chloride tablet 2 g  2 g Oral TID WC Gillis Santa, MD   2 g at 03/15/24 1207   thiamine (VITAMIN B1) tablet 100 mg  100 mg Oral Daily Gillis Santa, MD   100 mg at 03/15/24 0926   vancomycin (VANCOCIN) IVPB 1000 mg/200 mL premix  1,000 mg Intravenous Q12H Barrie Folk, RPH 200 mL/hr at 03/15/24 7253 1,000 mg at 03/15/24 6644     Abtx:  Anti-infectives (From admission, onward)    Start     Dose/Rate  Route Frequency Ordered Stop   03/13/24 1700  vancomycin (VANCOCIN) IVPB 1000 mg/200 mL premix        1,000 mg 200 mL/hr over 60 Minutes Intravenous Every 12 hours 03/13/24 1610     03/12/24 1400  piperacillin-tazobactam (ZOSYN) IVPB 3.375 g        3.375 g 12.5 mL/hr over 240 Minutes Intravenous Every 8 hours 03/12/24 0835     03/12/24 1400  vancomycin (VANCOREADY) IVPB 1250 mg/250 mL  Status:  Discontinued        1,250 mg 166.7 mL/hr over 90 Minutes Intravenous Every 24 hours 03/12/24 0835 03/13/24 1609   03/09/24 1400  vancomycin (VANCOREADY) IVPB 1250 mg/250 mL  Status:  Discontinued        1,250 mg 166.7 mL/hr over 90 Minutes Intravenous  Once 03/08/24  1953 03/09/24 1125   03/09/24 1400  vancomycin (VANCOREADY) IVPB 1250 mg/250 mL  Status:  Discontinued        1,250 mg 166.7 mL/hr over 90 Minutes Intravenous Every 24 hours 03/09/24 1125 03/12/24 0813   03/09/24 0600  piperacillin-tazobactam (ZOSYN) IVPB 3.375 g  Status:  Discontinued        3.375 g 12.5 mL/hr over 240 Minutes Intravenous Every 8 hours 03/08/24 1953 03/12/24 0813   03/08/24 2000  vancomycin (VANCOREADY) IVPB 500 mg/100 mL        500 mg 100 mL/hr over 60 Minutes Intravenous  Once 03/08/24 1953 03/08/24 2145   03/08/24 1645  ceFEPIme (MAXIPIME) 2 g in sodium chloride 0.9 % 100 mL IVPB        2 g 200 mL/hr over 30 Minutes Intravenous  Once 03/08/24 1638 03/08/24 1831   03/08/24 1645  metroNIDAZOLE (FLAGYL) IVPB 500 mg        500 mg 100 mL/hr over 60 Minutes Intravenous  Once 03/08/24 1638 03/08/24 1831   03/08/24 1645  vancomycin (VANCOCIN) IVPB 1000 mg/200 mL premix        1,000 mg 200 mL/hr over 60 Minutes Intravenous  Once 03/08/24 1638 03/08/24 1937       REVIEW OF SYSTEMS:  Const: negative fever, negative chills, negative weight loss Eyes: negative diplopia or visual changes, negative eye pain ENT: negative coryza, negative sore throat Resp: baseline cough, hemoptysis, dyspnea Cards: negative for chest pain, palpitations, lower extremity edema GU: negative for frequency, dysuria and hematuria GI: c/o abdominal distension Skin: negative for rash and pruritus Heme: negative for easy bruising and gum/nose bleeding MS: has weakness  Neurolo:dizziness Psych: negative for feelings of anxiety, depression  Endocrine: negative for thyroid, diabetes Allergy/Immunology- multiple meds as above Objective:  VITALS:  BP 112/65   Pulse 85   Temp 98.4 F (36.9 C) (Oral)   Resp 20   Ht 5\' 3"  (1.6 m)   Wt 72.9 kg   SpO2 98%   BMI 28.47 kg/m   PHYSICAL EXAM:  General: Alert, cooperative, no distress, appears stated age.  Head: Normocephalic, without obvious  abnormality, atraumatic. Eyes: Conjunctivae clear, anicteric sclerae. Pupils are equal ENT Nares normal. No drainage or sinus tenderness. Lips, mucosa, and tongue normal. No Thrush Neck: Supple, symmetrical, no adenopathy, thyroid: non tender no carotid bruit and no JVD. Back: No CVA tenderness. Lungs:b/l air entry. Heart: Regular rate and rhythm, no murmur, rub or gallop. Abdomen: Soft,distended Extremities: ++ edemal legs and ankle Skin: No rashes or lesions. Or bruising Lymph: Cervical, supraclavicular normal. Neurologic: Grossly non-focal Pertinent Labs Lab Results CBC    Component Value Date/Time   WBC  1.1 (LL) 03/15/2024 0500   RBC 2.33 (L) 03/15/2024 0500   HGB 6.7 (L) 03/15/2024 0500   HGB 10.7 (L) 03/08/2024 1321   HGB 12.3 02/09/2024 1300   HCT 19.3 (L) 03/15/2024 0500   HCT 37.6 02/09/2024 1300   PLT 112 (L) 03/15/2024 0500   PLT 155 03/08/2024 1321   PLT 445 02/09/2024 1300   MCV 82.8 03/15/2024 0500   MCV 92 02/09/2024 1300   MCH 28.8 03/15/2024 0500   MCHC 34.7 03/15/2024 0500   RDW 14.6 03/15/2024 0500   RDW 12.8 02/09/2024 1300   LYMPHSABS 0.6 (L) 03/15/2024 0500   LYMPHSABS 1.1 02/09/2024 1300   MONOABS 0.2 03/15/2024 0500   EOSABS 0.0 03/15/2024 0500   EOSABS 0.1 02/09/2024 1300   BASOSABS 0.0 03/15/2024 0500   BASOSABS 0.0 02/09/2024 1300       Latest Ref Rng & Units 03/15/2024    5:00 AM 03/14/2024    5:30 AM 03/13/2024    5:30 AM  CMP  Glucose 70 - 99 mg/dL 098  89  97   BUN 8 - 23 mg/dL 12  11  11    Creatinine 0.44 - 1.00 mg/dL 1.19  1.47  8.29   Sodium 135 - 145 mmol/L 131  130  130   Potassium 3.5 - 5.1 mmol/L 3.2  3.6  3.8   Chloride 98 - 111 mmol/L 100  103  105   CO2 22 - 32 mmol/L 24  22  21    Calcium 8.9 - 10.3 mg/dL 6.8  6.9  6.9   Total Protein 6.5 - 8.1 g/dL 4.2  4.3  4.6   Total Bilirubin 0.0 - 1.2 mg/dL 0.8  1.0  1.3   Alkaline Phos 38 - 126 U/L 111  119  99   AST 15 - 41 U/L 29  31  33   ALT 0 - 44 U/L 10  10  12         Microbiology: Recent Results (from the past 240 hours)  Urine culture     Status: None   Collection Time: 03/07/24  3:00 PM   Specimen: Urine, Random  Result Value Ref Range Status   Specimen Description   Final    URINE, RANDOM Performed at [facility], [address], [place], [place] [address]    Special Requests   Final    NONE Performed at [facility], [address] [place], [place] [address]    Culture   Final    NO GROWTH Performed at [facility], [address], [place], [place] [address]    Report Status 03/08/2024 FINAL  Final  Resp panel by RT-PCR (RSV, Flu A&B, Covid) Anterior Nasal Swab     Status: None   Collection Time: 03/08/24  5:14 PM   Specimen: Anterior Nasal Swab  Result Value Ref Range Status   SARS Coronavirus 2 by RT PCR NEGATIVE NEGATIVE Final    Comment: (NOTE) SARS-CoV-2 target nucleic acids are NOT DETECTED.  The SARS-CoV-2 RNA is generally detectable in upper respiratory specimens during the acute phase of infection. The lowest concentration of SARS-CoV-2 viral copies this assay can detect is 138 copies/mL. A negative result does not preclude SARS-Cov-2 infection and should not be used as the sole basis for treatment or other patient management decisions. A negative result may occur with  improper specimen collection/handling, submission of specimen other than nasopharyngeal swab, presence of viral mutation(s) within the areas targeted by this assay,  and inadequate number of viral copies(<138 copies/mL). A negative result must be combined with clinical observations, patient history, and epidemiological information. The expected result is Negative.  Fact Sheet for Patients:  BloggerCourse.com  Fact Sheet for Healthcare Providers:  SeriousBroker.it  This test is no t yet approved or cleared by the [place] FDA and  has been authorized for detection and/or  diagnosis of SARS-CoV-2 by FDA under an Emergency Use Authorization (EUA). This EUA will remain  in effect (meaning this test can be used) for the duration of the COVID-19 declaration under Section 564(b)(1) of the Act, 21 U.S.C.section 360bbb-3(b)(1), unless the authorization is terminated  or revoked sooner.       Influenza A by PCR NEGATIVE NEGATIVE Final   Influenza B by PCR NEGATIVE NEGATIVE Final    Comment: (NOTE) The Xpert Xpress SARS-CoV-2/FLU/RSV plus assay is intended as an aid in the diagnosis of influenza from Nasopharyngeal swab specimens and should not be used as a sole basis for treatment. Nasal washings and aspirates are unacceptable for Xpert Xpress SARS-CoV-2/FLU/RSV testing.  Fact Sheet for Patients: BloggerCourse.com  Fact Sheet for Healthcare Providers: SeriousBroker.it  This test is not yet approved or cleared by the [place] FDA and has been authorized for detection and/or diagnosis of SARS-CoV-2 by FDA under an Emergency Use Authorization (EUA). This EUA will remain in effect (meaning this test can be used) for the duration of the COVID-19 declaration under Section 564(b)(1) of the Act, 21 U.S.C. section 360bbb-3(b)(1), unless the authorization is terminated or revoked.     Resp Syncytial Virus by PCR NEGATIVE NEGATIVE Final    Comment: (NOTE) Fact Sheet for Patients: BloggerCourse.com  Fact Sheet for Healthcare Providers: SeriousBroker.it  This test is not yet approved or cleared by the [place] FDA and has been authorized for detection and/or diagnosis of SARS-CoV-2 by FDA under an Emergency Use Authorization (EUA). This EUA will remain in effect (meaning this test can be used) for the duration of the COVID-19 declaration under Section 564(b)(1) of the Act, 21 U.S.C. section 360bbb-3(b)(1), unless the authorization is terminated  or revoked.  Performed at [facility], [address] [place], [place] [address]   Respiratory (~20 pathogens) panel by PCR     Status: None   Collection Time: 03/08/24  5:14 PM   Specimen: Nasopharyngeal Swab; Respiratory  Result Value Ref Range Status   Adenovirus NOT DETECTED NOT DETECTED Final   Coronavirus 229E NOT DETECTED NOT DETECTED Final    Comment: (NOTE) The Coronavirus on the Respiratory Panel, DOES NOT test for the novel  Coronavirus (2019 nCoV)    Coronavirus HKU1 NOT DETECTED NOT DETECTED Final   Coronavirus NL63 NOT DETECTED NOT DETECTED Final   Coronavirus OC43 NOT DETECTED NOT DETECTED Final   Metapneumovirus NOT DETECTED NOT DETECTED Final   Rhinovirus / Enterovirus NOT DETECTED NOT DETECTED Final   Influenza A NOT DETECTED NOT DETECTED Final   Influenza B NOT DETECTED NOT DETECTED Final   Parainfluenza Virus 1 NOT DETECTED NOT DETECTED Final   Parainfluenza Virus 2 NOT DETECTED NOT DETECTED Final   Parainfluenza Virus 3 NOT DETECTED NOT DETECTED Final   Parainfluenza Virus 4 NOT DETECTED NOT DETECTED Final   Respiratory Syncytial Virus NOT DETECTED NOT DETECTED Final   Bordetella pertussis NOT DETECTED NOT DETECTED Final   Bordetella Parapertussis NOT DETECTED NOT DETECTED Final   Chlamydophila pneumoniae NOT DETECTED NOT DETECTED Final   Mycoplasma pneumoniae NOT DETECTED NOT DETECTED Final  Comment: Performed at [facility], [address], [place], [place] [address]  Culture, blood (routine x 2)     Status: None   Collection Time: 03/08/24  7:45 PM   Specimen: Right Antecubital; Blood  Result Value Ref Range Status   Specimen Description RIGHT ANTECUBITAL  Final   Special Requests   Final    BOTTLES DRAWN AEROBIC AND ANAEROBIC Blood Culture adequate volume   Culture   Final    NO GROWTH 5 DAYS Performed at [facility], [address] [place], [place] [address]    Report Status 03/13/2024 FINAL  Final  Culture, blood  (routine x 2)     Status: None   Collection Time: 03/08/24  7:53 PM   Specimen: BLOOD RIGHT FOREARM  Result Value Ref Range Status   Specimen Description BLOOD RIGHT FOREARM  Final   Special Requests   Final    BOTTLES DRAWN AEROBIC ONLY Blood Culture results may not be optimal due to an inadequate volume of blood received in culture bottles   Culture   Final    NO GROWTH 5 DAYS Performed at [facility], [address], [place], [place] [address]    Report Status 03/13/2024 FINAL  Final  C Difficile Quick Screen w PCR reflex     Status: None   Collection Time: 03/09/24 10:00 AM  Result Value Ref Range Status   C Diff antigen NEGATIVE NEGATIVE Final   C Diff toxin NEGATIVE NEGATIVE Final   C Diff interpretation No C. difficile detected.  Final    Comment: Performed at [facility], [address] [place], [place] [address]  Gastrointestinal Panel by PCR , Stool     Status: None   Collection Time: 03/12/24  1:00 PM   Specimen: Stool  Result Value Ref Range Status   Campylobacter species NOT DETECTED NOT DETECTED Final   Plesimonas shigelloides NOT DETECTED NOT DETECTED Final   Salmonella species NOT DETECTED NOT DETECTED Final   Yersinia enterocolitica NOT DETECTED NOT DETECTED Final   Vibrio species NOT DETECTED NOT DETECTED Final   Vibrio cholerae NOT DETECTED NOT DETECTED Final   Enteroaggregative E coli (EAEC) NOT DETECTED NOT DETECTED Final   Enteropathogenic E coli (EPEC) NOT DETECTED NOT DETECTED Final   Enterotoxigenic E coli (ETEC) NOT DETECTED NOT DETECTED Final   Shiga like toxin producing E coli (STEC) NOT DETECTED NOT DETECTED Final   Shigella/Enteroinvasive E coli (EIEC) NOT DETECTED NOT DETECTED Final   Cryptosporidium NOT DETECTED NOT DETECTED Final   Cyclospora cayetanensis NOT DETECTED NOT DETECTED Final   Entamoeba histolytica NOT DETECTED NOT DETECTED Final   Giardia lamblia NOT DETECTED NOT DETECTED Final   Adenovirus F40/41 NOT DETECTED  NOT DETECTED Final   Astrovirus NOT DETECTED NOT DETECTED Final   Norovirus GI/GII NOT DETECTED NOT DETECTED Final   Rotavirus A NOT DETECTED NOT DETECTED Final   Sapovirus (I, II, IV, and V) NOT DETECTED NOT DETECTED Final    Comment: Performed at [facility], [address] [place], [place] [address]    IMAGING RESULTS: CXR b/l atelectasis  3/3 nulcear med reviewed  I have personally reviewed the films ? Impression/Recommendation Neutropenia following chemotherapy  Fever - could be from neutropenia but she has had fever for more than 8 weeks. Could be tumor fever Blood cultrue neg UC N CXR no infiltrate PORT site looks okay Will DC vanco and zosyn as she has received for 8 days  Anemia- getting PRBC Thrombocytopenia- improving Neutropenia ( absolute count around 170 ? Ascites multifactorial- hypoalbuminema, anemia, liver mets  Edema legs  H/o ca breast ? _I have personally spent  -60--minutes involved in face-to-face and non-face-to-face activities for this patient on the day of the visit. Professional time spent includes the following activities: Preparing to see the patient (review of tests), Obtaining and/or reviewing separately obtained history (admission/discharge record), Performing a medically appropriate examination and/or evaluation , Ordering medications/tests/procedures, referring and communicating with other health care professionals, Documenting clinical information in the EMR, Independently interpreting results (not separately reported), Communicating results to the patient/family/caregiver, Counseling and educating the patient/family/caregiver and Care coordination (not separately reported).  Discussed with care team  ________________________________________________

## 2024-03-16 DIAGNOSIS — R197 Diarrhea, unspecified: Secondary | ICD-10-CM

## 2024-03-16 DIAGNOSIS — E871 Hypo-osmolality and hyponatremia: Secondary | ICD-10-CM | POA: Diagnosis not present

## 2024-03-16 DIAGNOSIS — C257 Malignant neoplasm of other parts of pancreas: Secondary | ICD-10-CM | POA: Diagnosis not present

## 2024-03-16 DIAGNOSIS — I5032 Chronic diastolic (congestive) heart failure: Secondary | ICD-10-CM | POA: Diagnosis not present

## 2024-03-16 DIAGNOSIS — R6510 Systemic inflammatory response syndrome (SIRS) of non-infectious origin without acute organ dysfunction: Secondary | ICD-10-CM | POA: Diagnosis not present

## 2024-03-16 DIAGNOSIS — E440 Moderate protein-calorie malnutrition: Secondary | ICD-10-CM

## 2024-03-16 LAB — TYPE AND SCREEN
ABO/RH(D): A POS
Antibody Screen: NEGATIVE
Unit division: 0

## 2024-03-16 LAB — CBC WITH DIFFERENTIAL/PLATELET
Abs Immature Granulocytes: 0.02 10*3/uL (ref 0.00–0.07)
Basophils Absolute: 0 10*3/uL (ref 0.0–0.1)
Basophils Relative: 0 %
Eosinophils Absolute: 0 10*3/uL (ref 0.0–0.5)
Eosinophils Relative: 1 %
HCT: 22 % — ABNORMAL LOW (ref 36.0–46.0)
Hemoglobin: 8 g/dL — ABNORMAL LOW (ref 12.0–15.0)
Immature Granulocytes: 1 %
Lymphocytes Relative: 38 %
Lymphs Abs: 1 10*3/uL (ref 0.7–4.0)
MCH: 29.4 pg (ref 26.0–34.0)
MCHC: 36.4 g/dL — ABNORMAL HIGH (ref 30.0–36.0)
MCV: 80.9 fL (ref 80.0–100.0)
Monocytes Absolute: 0.4 10*3/uL (ref 0.1–1.0)
Monocytes Relative: 16 %
Neutro Abs: 1.2 10*3/uL — ABNORMAL LOW (ref 1.7–7.7)
Neutrophils Relative %: 44 %
Platelets: 164 10*3/uL (ref 150–400)
RBC: 2.72 MIL/uL — ABNORMAL LOW (ref 3.87–5.11)
RDW: 14.6 % (ref 11.5–15.5)
Smear Review: NORMAL
WBC: 2.6 10*3/uL — ABNORMAL LOW (ref 4.0–10.5)
nRBC: 0 % (ref 0.0–0.2)

## 2024-03-16 LAB — BASIC METABOLIC PANEL WITH GFR
Anion gap: 4 — ABNORMAL LOW (ref 5–15)
BUN: 9 mg/dL (ref 8–23)
CO2: 25 mmol/L (ref 22–32)
Calcium: 7 mg/dL — ABNORMAL LOW (ref 8.9–10.3)
Chloride: 103 mmol/L (ref 98–111)
Creatinine, Ser: 0.61 mg/dL (ref 0.44–1.00)
GFR, Estimated: >60 mL/min (ref >60)
Glucose, Bld: 95 mg/dL (ref 70–99)
Potassium: 3.7 mmol/L (ref 3.5–5.1)
Sodium: 132 mmol/L — ABNORMAL LOW (ref 135–145)

## 2024-03-16 LAB — BPAM RBC
Blood Product Expiration Date: 202504272359
ISSUE DATE / TIME: 202503281143
Unit Type and Rh: 6200

## 2024-03-16 LAB — GLUCOSE, CAPILLARY: Glucose-Capillary: 84 mg/dL (ref 70–99)

## 2024-03-16 MED ORDER — INSULIN GLARGINE 100 UNIT/ML SOLOSTAR PEN: 8.0000 [IU] | PEN_INJECTOR | Freq: Every day | SUBCUTANEOUS | Status: DC

## 2024-03-16 MED ORDER — METOPROLOL SUCCINATE ER 50 MG PO TB24: 50.0000 mg | ORAL_TABLET | Freq: Every day | ORAL | 2 refills | Status: DC

## 2024-03-16 MED ORDER — FOLIC ACID 1 MG PO TABS: 1.0000 mg | ORAL_TABLET | Freq: Every day | ORAL | 1 refills | Status: DC

## 2024-03-16 MED ORDER — HEPARIN SOD (PORK) LOCK FLUSH 100 UNIT/ML IV SOLN
500.0000 [IU] | Freq: Once | INTRAVENOUS | Status: AC
  Administered 2024-03-16: 500 [IU] via INTRAVENOUS
  Filled 2024-03-16: qty 5

## 2024-03-16 MED ORDER — NICOTINE 21 MG/24HR TD PT24: 21.0000 mg | MEDICATED_PATCH | Freq: Every day | TRANSDERMAL | 0 refills | Status: DC

## 2024-03-16 NOTE — Discharge Summary (Incomplete)
 Physician Discharge Summary   Patient: Heather Burke MRN: [id number] DOB: 05-Dec-1951  Admit date:     03/08/2024  Discharge date: {dischdate:26783}  Discharge Physician: Marcelino Duster   PCP: Sallee Provencal, FNP   Recommendations at discharge:  {Tip this will not be part of the note when signed- Example include specific recommendations for outpatient follow-up, pending tests to follow-up on. (Optional):26781}  ***  Discharge Diagnoses: Principal Problem:   SIRS (systemic inflammatory response syndrome) (HCC) Active Problems:   Malignant neoplasm of other parts of pancreas (HCC)   Hyponatremia   Hypertension associated with diabetes (HCC)   Hypothyroidism   Hyperlipemia   Chronic diastolic CHF (congestive heart failure) (HCC)   Diarrhea   Alcohol dependence, daily use (HCC)   Diabetes mellitus without complication (HCC)   Depression   Protein-calorie malnutrition, moderate (HCC)   Chemotherapy induced neutropenia (HCC)  Resolved Problems:   * No resolved hospital problems. [facility] Course: No notes on file  Assessment and Plan: No notes have been filed under this hospital service. Service: Hospitalist     {Tip this will not be part of the note when signed Body mass index is 21.52 kg/m. ,  Nutrition Documentation    Flowsheet Row ED to Hosp-Admission (Current) from 03/08/2024 in [facility] 1C MEDICAL TELEMETRY  Nutrition Problem Moderate Malnutrition  Etiology chronic illness  [stage IV pancreatic cancer]  Nutrition Goal Patient will meet greater than or equal to 90% of their needs  Interventions Boost Breeze, MVI, Liberalize Diet     ,  (Optional):26781}  {(NOTE) Pain control PDMP Statment (Optional):26782} Consultants: *** Procedures performed: ***  Disposition: {Plan; Disposition:26390} Diet recommendation:  Discharge Diet Orders (From admission, onward)     Start     Ordered   03/16/24 0000  Diet - low sodium heart  healthy        03/16/24 0946   03/16/24 0000  Diet Carb Modified        03/16/24 0946           {Diet_Plan:26776} DISCHARGE MEDICATION: Allergies as of 03/16/2024       Reactions   Metformin And Related Diarrhea   SEVERE DIARRHEA   Amlodipine Nausea Only, Other (See Comments)   Stomach cramping   Atorvastatin Nausea And Vomiting   Lisinopril Cough   Pravastatin Nausea And Vomiting   Icosapent Ethyl (epa Ethyl Ester) (fish) Palpitations   Nifedipine Er Other (See Comments)   headache        Medication List     STOP taking these medications    diltiazem 240 MG 24 hr capsule Commonly known as: Dilt-XR   levofloxacin 250 MG tablet Commonly known as: Levaquin   mirtazapine 15 MG tablet Commonly known as: Remeron   valsartan-hydrochlorothiazide 320-12.5 MG tablet Commonly known as: DIOVAN-HCT       TAKE these medications    Accu-Chek Guide Test test strip Generic drug: glucose blood USE 1 STIP TO CHECK BLOOD SUGAR IN THE MORNING, AT NOON, AND AT BEDTIME   Accu-Chek Softclix Lancets lancets 1 EACH BY DOES NOT APPLY ROUTE IN THE MORNING, AT NOON, AND AT BEDTIME   aspirin 81 MG tablet Take 81 mg by mouth daily.   Blood Glucose Monitoring Suppl Devi 1 each by Does not apply route in the morning, at noon, and at bedtime. May substitute to any manufacturer covered by patient's insurance.   Cholecalciferol 125 MCG (5000 UT) Tabs Take 1 tablet by mouth daily.  dexamethasone 4 MG tablet Commonly known as: DECADRON Take 2 tablets (8 mg total) by mouth daily. Take 2 tablets daily x 3 days starting the day after chemotherapy. Take with food.   fenofibrate 145 MG tablet Commonly known as: Tricor Take 1 tablet (145 mg total) by mouth daily.   fluticasone 50 MCG/ACT nasal spray Commonly known as: FLONASE SPRAY 2 SPRAYS INTO EACH NOSTRIL EVERY DAY   folic acid 1 MG tablet Commonly known as: FOLVITE Take 1 tablet (1 mg total) by mouth daily. Start taking on:  March 17, 2024   FreeStyle Libre 3 Plus Sensor Misc Place 1 sensor on the skin every 15 days. Use to check glucose continuously   insulin glargine 100 UNIT/ML Solostar Pen Commonly known as: LANTUS Inject 8 Units into the skin at bedtime. Titrate by 2 units every 3 days for fasting morning blood sugar >150. What changed: how much to take   latanoprost 0.005 % ophthalmic solution Commonly known as: XALATAN 1 drop at bedtime.   levothyroxine 88 MCG tablet Commonly known as: SYNTHROID Take 1 tablet (88 mcg total) by mouth daily.   lidocaine-prilocaine cream Commonly known as: EMLA Apply to affected area once   loperamide 2 MG capsule Commonly known as: IMODIUM Take 2 tabs by mouth with first loose stool, then 1 tab with each additional loose stool as needed. Do not exceed 8 tabs in a 24-hour period   metoprolol succinate 50 MG 24 hr tablet Commonly known as: TOPROL-XL Take 1 tablet (50 mg total) by mouth daily. What changed:  medication strength how much to take   nicotine 21 mg/24hr patch Commonly known as: NICODERM CQ - dosed in mg/24 hours Place 1 patch (21 mg total) onto the skin daily. Start taking on: March 17, 2024   ondansetron 8 MG tablet Commonly known as: Zofran Take 1 tablet (8 mg total) by mouth every 8 (eight) hours as needed for nausea or vomiting. Start on the third day after chemotherapy   oxyCODONE 5 MG immediate release tablet Commonly known as: Oxy IR/ROXICODONE Take 1 tablet (5 mg total) by mouth every 4 (four) hours as needed for severe pain (pain score 7-10).   PEN NEEDLES 31GX5/16" 31G X 8 MM Misc 1 each by Does not apply route daily.   potassium chloride 10 MEQ tablet Commonly known as: Klor-Con M10 Take 1 tablet (10 mEq total) by mouth daily.   prochlorperazine 10 MG tablet Commonly known as: COMPAZINE Take 1 tablet (10 mg total) by mouth every 6 (six) hours as needed for nausea or vomiting (Nausea or vomiting).   Vyzulta 0.024 %  Soln Generic drug: Latanoprostene Bunod Place 1 drop into both eyes at bedtime.        Follow-up Information     Sallee Provencal, FNP Follow up.   Specialty: Family Medicine Why: Hospital follow up Contact information: [address] [place] [address] [phone]                Discharge Exam: Ceasar Mons Weights   03/14/24 0500 03/15/24 0500 03/16/24 0427  Weight: 52.8 kg 72.9 kg 55.1 kg   ***  Condition at discharge: {DC Condition:26389}  The results of significant diagnostics from this hospitalization (including imaging, microbiology, ancillary and laboratory) are listed below for reference.   Imaging Studies: DG Chest 2 View Result Date: 03/08/2024 CLINICAL DATA:  Fever.  Sepsis. EXAM: CHEST - 2 VIEW COMPARISON:  Yesterday FINDINGS: Apical lordotic frontal radiograph. AP lateral views. Right  Port-A-Cath tip superior caval/atrial junction. Midline trachea. Borderline cardiomegaly. Atherosclerosis in the transverse aorta. Tiny bilateral pleural effusions are similar. No pneumothorax. No congestive failure. Similar subsegmental atelectasis at both lung bases. IMPRESSION: No significant change since one day prior. Tiny bilateral pleural effusions with subsegmental atelectasis at both lung bases. Aortic Atherosclerosis (ICD10-I70.0). Electronically Signed   By: Jeronimo Greaves M.D.   On: 03/08/2024 18:32   DG Chest 2 View Result Date: 03/07/2024 CLINICAL DATA:  Cough, fever. EXAM: CHEST - 2 VIEW COMPARISON:  None Available. FINDINGS: The heart size and mediastinal contours are within normal limits. Right internal jugular Port-A-Cath is in grossly good position. Minimal bibasilar subsegmental atelectasis is noted with small pleural effusions. The visualized skeletal structures are unremarkable. IMPRESSION: Minimal bibasilar subsegmental atelectasis is noted with small pleural effusions. Electronically Signed   By: Lupita Raider M.D.   On: 03/07/2024 16:42   IR  IMAGING GUIDED PORT INSERTION Result Date: 02/28/2024 INDICATION: 73 year old female with pancreatic cancer in need of durable venous access to allow for chemotherapy. She presents for port catheter placement. EXAM: IMPLANTED PORT A CATH PLACEMENT WITH ULTRASOUND AND FLUOROSCOPIC GUIDANCE MEDICATIONS: None. ANESTHESIA/SEDATION: Versed 1 mg IV; Fentanyl 50 mcg IV; administered by the radiology nurse Moderate Sedation Time:  10 minutes The patient's vital signs and level of consciousness were monitored during the procedure by the interventional radiology nurse under my direct supervision. FLUOROSCOPY: Radiation exposure index: 2 mGy, reference air kerma COMPLICATIONS: None immediate. PROCEDURE: The right neck and chest was prepped with chlorhexidine, and draped in the usual sterile fashion using maximum barrier technique (cap and mask, sterile gown, sterile gloves, large sterile sheet, hand hygiene and cutaneous antiseptic). Local anesthesia was attained by infiltration with 1% lidocaine with epinephrine. Ultrasound demonstrated patency of the right internal jugular vein, and this was documented with an image. Under real-time ultrasound guidance, this vein was accessed with a 21 gauge micropuncture needle and image documentation was performed. A small dermatotomy was made at the access site with an 11 scalpel. A 0.018" wire was advanced into the SVC and the access needle exchanged for a 70F micropuncture vascular sheath. The 0.018" wire was then removed and a 0.035" wire advanced into the IVC. An appropriate location for the subcutaneous reservoir was selected below the clavicle and an incision was made through the skin and underlying soft tissues. The subcutaneous tissues were then dissected using a combination of blunt and sharp surgical technique and a pocket was formed. A Bard Clear Vue single lumen power injectable portacatheter was then tunneled through the subcutaneous tissues from the pocket to the dermatotomy  and the port reservoir placed within the subcutaneous pocket. The venous access site was then serially dilated and a peel away vascular sheath placed over the wire. The wire was removed and the port catheter advanced into position under fluoroscopic guidance. The catheter tip is positioned in the superior cavoatrial junction. This was documented with a spot image. The portacatheter was then tested and found to flush and aspirate well. The port was flushed with saline followed by 100 units/mL heparinized saline. The pocket was then closed in two layers using first subdermal inverted interrupted absorbable sutures followed by a running subcuticular suture. The epidermis was then sealed with Dermabond. The dermatotomy at the venous access site was also closed with Dermabond. IMPRESSION: Successful placement of a right IJ approach Bard Clear Vue port catheter with ultrasound and fluoroscopic guidance. The catheter is ready for use. Electronically Signed   By: Vilma Prader  Archer Asa M.D.   On: 02/28/2024 15:28   NM PET Image Initial (PI) Skull Base To Thigh Result Date: 02/23/2024 CLINICAL DATA:  Initial treatment strategy for pancreatic carcinoma. EXAM: NUCLEAR MEDICINE PET SKULL BASE TO THIGH TECHNIQUE: 7.1 mCi F-18 FDG was injected intravenously. Full-ring PET imaging was performed from the skull base to thigh after the radiotracer. CT data was obtained and used for attenuation correction and anatomic localization. Fasting blood glucose: 75 mg/dl COMPARISON:  CT on 16/09/9603 FINDINGS: Mediastinal blood-pool activity (background): SUV max = 1.9 Liver activity (reference): SUV max = N/A NECK:  No hypermetabolic lymph nodes or masses. Incidental CT findings:  None. CHEST: 5 mm left internal mammary chain lymph node is hypermetabolic, with SUV max of 4.3. 5 mm precardiac mediastinal lymph node is also hypermetabolic, with SUV max of 4.0. No suspicious pulmonary nodules seen on CT images. Incidental CT findings:  None.  ABDOMEN/PELVIS: 3.8 x 3.6 cm mass in the pancreatic head shows intense hypermetabolism, with SUV max of 15.4, consistent with primary pancreatic carcinoma. Hypermetabolic lymphadenopathy is seen in the adjacent peripancreatic retroperitoneum, gastrohepatic ligament, porta hepatis, and portacaval space, with 1 index lymph node in the gastrohepatic ligament measuring 2.1 cm with SUV max of 14.2. Numerous hypermetabolic metastases are seen throughout liver, largest occupying nearly the entire lateral segment of the left hepatic lobe this has SUV max of 14.2. A solid mass is seen in the anterior midpole of the right kidney measuring 4.7 x 4.4 cm. This shows mild FDG uptake compared to other disease, with SUV max of 2.9. This favors primary renal cell carcinoma over renal metastasis. No hypermetabolic masses or lymphadenopathy identified within pelvis. Incidental CT findings: Prior hysterectomy. Small amount of free fluid in pelvis. Colonic diverticulosis, without signs of diverticulitis. SKELETON: No focal hypermetabolic bone lesions to suggest skeletal metastasis. Incidental CT findings:  None. IMPRESSION: 3.8 cm hypermetabolic mass in pancreatic head, consistent with primary pancreatic carcinoma. Hypermetabolic abdominal lymphadenopathy, consistent with metastatic disease. Diffuse hypermetabolic liver metastases. 5 mm hypermetabolic left internal mammary and precardiac lymph nodes, consistent with metastatic disease. 4.7 cm solid mass in anterior midpole of right kidney, which shows mild FDG uptake. This favors primary renal cell carcinoma over renal metastasis. Electronically Signed   By: Danae Orleans M.D.   On: 02/23/2024 08:35   US BIOPSY (LIVER) Result Date: 02/20/2024 INDICATION: Multiple liver lesions, mass of the head of the pancreas, right renal mass and prior history of breast carcinoma. Clinical suspicion of metastatic pancreatic carcinoma based on recent imaging. Presenting for liver lesion biopsy. EXAM:  ULTRASOUND GUIDED CORE BIOPSY OF LIVER MASS MEDICATIONS: None. ANESTHESIA/SEDATION: Moderate (conscious) sedation was employed during this procedure. A total of Versed 1.0 mg and Fentanyl 50 mcg was administered intravenously. Moderate Sedation Time: 13 minutes. The patient's level of consciousness and vital signs were monitored continuously by radiology nursing throughout the procedure under my direct supervision. PROCEDURE: The procedure, risks, benefits, and alternatives were explained to the patient. Questions regarding the procedure were encouraged and answered. The patient understands and consents to the procedure. A time-out was performed prior to initiating the procedure. The abdominal wall was prepped with chlorhexidine in a sterile fashion, and a sterile drape was applied covering the operative field. A sterile gown and sterile gloves were used for the procedure. Local anesthesia was provided with 1% Lidocaine. Ultrasound was used to localize a lesion within the left lobe of the liver. Under ultrasound guidance, a 17 gauge trocar needle was advanced into the lesion. Three  separate coaxial 18 gauge core biopsy samples were obtained and submitted in formalin. A slurry of Gel-Foam EmboCubes was then injected as the trocar needle was retracted and removed. Additional ultrasound imaging was performed. COMPLICATIONS: None immediate. FINDINGS: Confluent tumor in the left lobe of the liver was localized measuring nearly 9 cm in maximum diameter. Solid tissue was obtained. IMPRESSION: Ultrasound-guided core biopsy performed of tumor within the left lobe of the liver. Electronically Signed   By: Irish Lack M.D.   On: 02/20/2024 10:00    Microbiology: Results for orders placed or performed during the hospital encounter of 03/08/24  Resp panel by RT-PCR (RSV, Flu A&B, Covid) Anterior Nasal Swab     Status: None   Collection Time: 03/08/24  5:14 PM   Specimen: Anterior Nasal Swab  Result Value Ref Range  Status   SARS Coronavirus 2 by RT PCR NEGATIVE NEGATIVE Final    Comment: (NOTE) SARS-CoV-2 target nucleic acids are NOT DETECTED.  The SARS-CoV-2 RNA is generally detectable in upper respiratory specimens during the acute phase of infection. The lowest concentration of SARS-CoV-2 viral copies this assay can detect is 138 copies/mL. A negative result does not preclude SARS-Cov-2 infection and should not be used as the sole basis for treatment or other patient management decisions. A negative result may occur with  improper specimen collection/handling, submission of specimen other than nasopharyngeal swab, presence of viral mutation(s) within the areas targeted by this assay, and inadequate number of viral copies(<138 copies/mL). A negative result must be combined with clinical observations, patient history, and epidemiological information. The expected result is Negative.  Fact Sheet for Patients:  BloggerCourse.com  Fact Sheet for Healthcare Providers:  SeriousBroker.it  This test is no t yet approved or cleared by the [place] FDA and  has been authorized for detection and/or diagnosis of SARS-CoV-2 by FDA under an Emergency Use Authorization (EUA). This EUA will remain  in effect (meaning this test can be used) for the duration of the COVID-19 declaration under Section 564(b)(1) of the Act, 21 U.S.C.section 360bbb-3(b)(1), unless the authorization is terminated  or revoked sooner.       Influenza A by PCR NEGATIVE NEGATIVE Final   Influenza B by PCR NEGATIVE NEGATIVE Final    Comment: (NOTE) The Xpert Xpress SARS-CoV-2/FLU/RSV plus assay is intended as an aid in the diagnosis of influenza from Nasopharyngeal swab specimens and should not be used as a sole basis for treatment. Nasal washings and aspirates are unacceptable for Xpert Xpress SARS-CoV-2/FLU/RSV testing.  Fact Sheet for  Patients: BloggerCourse.com  Fact Sheet for Healthcare Providers: SeriousBroker.it  This test is not yet approved or cleared by the [place] FDA and has been authorized for detection and/or diagnosis of SARS-CoV-2 by FDA under an Emergency Use Authorization (EUA). This EUA will remain in effect (meaning this test can be used) for the duration of the COVID-19 declaration under Section 564(b)(1) of the Act, 21 U.S.C. section 360bbb-3(b)(1), unless the authorization is terminated or revoked.     Resp Syncytial Virus by PCR NEGATIVE NEGATIVE Final    Comment: (NOTE) Fact Sheet for Patients: BloggerCourse.com  Fact Sheet for Healthcare Providers: SeriousBroker.it  This test is not yet approved or cleared by the [place] FDA and has been authorized for detection and/or diagnosis of SARS-CoV-2 by FDA under an Emergency Use Authorization (EUA). This EUA will remain in effect (meaning this test can be used) for the duration of the COVID-19 declaration under Section 564(b)(1) of the Act, 21  U.S.C. section 360bbb-3(b)(1), unless the authorization is terminated or revoked.  Performed at [facility], [address] [place], [place] [address]   Respiratory (~20 pathogens) panel by PCR     Status: None   Collection Time: 03/08/24  5:14 PM   Specimen: Nasopharyngeal Swab; Respiratory  Result Value Ref Range Status   Adenovirus NOT DETECTED NOT DETECTED Final   Coronavirus 229E NOT DETECTED NOT DETECTED Final    Comment: (NOTE) The Coronavirus on the Respiratory Panel, DOES NOT test for the novel  Coronavirus (2019 nCoV)    Coronavirus HKU1 NOT DETECTED NOT DETECTED Final   Coronavirus NL63 NOT DETECTED NOT DETECTED Final   Coronavirus OC43 NOT DETECTED NOT DETECTED Final   Metapneumovirus NOT DETECTED NOT DETECTED Final   Rhinovirus / Enterovirus NOT DETECTED NOT  DETECTED Final   Influenza A NOT DETECTED NOT DETECTED Final   Influenza B NOT DETECTED NOT DETECTED Final   Parainfluenza Virus 1 NOT DETECTED NOT DETECTED Final   Parainfluenza Virus 2 NOT DETECTED NOT DETECTED Final   Parainfluenza Virus 3 NOT DETECTED NOT DETECTED Final   Parainfluenza Virus 4 NOT DETECTED NOT DETECTED Final   Respiratory Syncytial Virus NOT DETECTED NOT DETECTED Final   Bordetella pertussis NOT DETECTED NOT DETECTED Final   Bordetella Parapertussis NOT DETECTED NOT DETECTED Final   Chlamydophila pneumoniae NOT DETECTED NOT DETECTED Final   Mycoplasma pneumoniae NOT DETECTED NOT DETECTED Final    Comment: Performed at [facility], [address], [place], [place] [address]  Culture, blood (routine x 2)     Status: None   Collection Time: 03/08/24  7:45 PM   Specimen: Right Antecubital; Blood  Result Value Ref Range Status   Specimen Description RIGHT ANTECUBITAL  Final   Special Requests   Final    BOTTLES DRAWN AEROBIC AND ANAEROBIC Blood Culture adequate volume   Culture   Final    NO GROWTH 5 DAYS Performed at [facility], [address] [place], [place] [address]    Report Status 03/13/2024 FINAL  Final  Culture, blood (routine x 2)     Status: None   Collection Time: 03/08/24  7:53 PM   Specimen: BLOOD RIGHT FOREARM  Result Value Ref Range Status   Specimen Description BLOOD RIGHT FOREARM  Final   Special Requests   Final    BOTTLES DRAWN AEROBIC ONLY Blood Culture results may not be optimal due to an inadequate volume of blood received in culture bottles   Culture   Final    NO GROWTH 5 DAYS Performed at [facility], [address] [place], [place] [address]    Report Status 03/13/2024 FINAL  Final  C Difficile Quick Screen w PCR reflex     Status: None   Collection Time: 03/09/24 10:00 AM  Result Value Ref Range Status   C Diff antigen NEGATIVE NEGATIVE Final   C Diff toxin NEGATIVE NEGATIVE Final   C Diff  interpretation No C. difficile detected.  Final    Comment: Performed at [facility], [address] [place], [place] [address]  Gastrointestinal Panel by PCR , Stool     Status: None   Collection Time: 03/12/24  1:00 PM   Specimen: Stool  Result Value Ref Range Status   Campylobacter species NOT DETECTED NOT DETECTED Final   Plesimonas shigelloides NOT DETECTED NOT DETECTED Final   Salmonella species NOT DETECTED NOT DETECTED Final   Yersinia enterocolitica NOT DETECTED NOT DETECTED  Final   Vibrio species NOT DETECTED NOT DETECTED Final   Vibrio cholerae NOT DETECTED NOT DETECTED Final   Enteroaggregative E coli (EAEC) NOT DETECTED NOT DETECTED Final   Enteropathogenic E coli (EPEC) NOT DETECTED NOT DETECTED Final   Enterotoxigenic E coli (ETEC) NOT DETECTED NOT DETECTED Final   Shiga like toxin producing E coli (STEC) NOT DETECTED NOT DETECTED Final   Shigella/Enteroinvasive E coli (EIEC) NOT DETECTED NOT DETECTED Final   Cryptosporidium NOT DETECTED NOT DETECTED Final   Cyclospora cayetanensis NOT DETECTED NOT DETECTED Final   Entamoeba histolytica NOT DETECTED NOT DETECTED Final   Giardia lamblia NOT DETECTED NOT DETECTED Final   Adenovirus F40/41 NOT DETECTED NOT DETECTED Final   Astrovirus NOT DETECTED NOT DETECTED Final   Norovirus GI/GII NOT DETECTED NOT DETECTED Final   Rotavirus A NOT DETECTED NOT DETECTED Final   Sapovirus (I, II, IV, and V) NOT DETECTED NOT DETECTED Final    Comment: Performed at [facility], [address] [place], [place] [address]    Labs: CBC: Recent Labs  Lab 03/13/24 0530 03/14/24 0530 03/14/24 1628 03/15/24 0500 03/16/24 0430  WBC 2.4* 1.3* 1.4* 1.1* 2.6*  NEUTROABS 1.6* 0.6* 0.5* 0.2* 1.2*  HGB 7.9* 7.2* 7.4* 6.7* 8.0*  HCT 22.4* 20.6* 21.3* 19.3* 22.0*  MCV 83.6 83.7 84.5 82.8 80.9  PLT 57* 86* 102* 112* 164   Basic Metabolic Panel: Recent Labs  Lab 03/11/24 0658 03/12/24 0555 03/13/24 0530 03/14/24 0530  03/15/24 0500 03/16/24 0430  NA 127* 130* 130* 130* 131* 132*  K 3.4* 3.1* 3.8 3.6 3.2* 3.7  CL 99 100 105 103 100 103  CO2 22 22 21* 22 24 25   GLUCOSE 141* 96 97 89 110* 95  BUN 15 11 11 11 12 9   CREATININE 0.53 0.38* 0.50 0.55 0.58 0.61  CALCIUM 6.9* 7.0* 6.9* 6.9* 6.8* 7.0*  MG 1.8 1.8 1.8 1.6* 1.9  --   PHOS 2.4* 2.2* 1.7* 2.4* 2.5  --    Liver Function Tests: Recent Labs  Lab 03/09/24 1115 03/13/24 0530 03/14/24 0530 03/15/24 0500  AST 37 33 31 29  ALT 16 12 10 10   ALKPHOS 86 99 119 111  BILITOT 1.6* 1.3* 1.0 0.8  PROT 5.3* 4.6* 4.3* 4.2*  ALBUMIN 1.6* <1.5* <1.5* <1.5*   CBG: Recent Labs  Lab 03/15/24 0828 03/15/24 1132 03/15/24 1625 03/15/24 2119 03/16/24 0853  GLUCAP 92 155* 163* 101* 84    Discharge time spent: {LESS THAN/GREATER THAN:26388} 30 minutes.  Signed: Marcelino Duster, MD [facility] 03/16/2024

## 2024-03-16 NOTE — Plan of Care (Signed)

## 2024-03-17 ENCOUNTER — Other Ambulatory Visit: Payer: Self-pay | Admitting: Oncology

## 2024-03-17 DIAGNOSIS — C257 Malignant neoplasm of other parts of pancreas: Secondary | ICD-10-CM

## 2024-03-17 NOTE — Discharge Summary (Signed)
 Physician Discharge Summary   Patient: Heather Burke MRN: [id number] DOB: Apr 23, 1951  Admit date:     03/08/2024  Discharge date: 03/16/2024  Discharge Physician: Marcelino Duster   PCP: Sallee Provencal, FNP   Recommendations at discharge:    PCP follow up in 1 week. Echo, CBC, BMP next week. Outpatient oncology follow up as scheduled.  Discharge Diagnoses: Principal Problem:   SIRS (systemic inflammatory response syndrome) (HCC) Active Problems:   Malignant neoplasm of other parts of pancreas (HCC)   Hyponatremia   Hypertension associated with diabetes (HCC)   Hypothyroidism   Hyperlipemia   Chronic diastolic CHF (congestive heart failure) (HCC)   Diarrhea   Alcohol dependence, daily use (HCC)   Diabetes mellitus without complication (HCC)   Depression   Protein-calorie malnutrition, moderate (HCC)   Chemotherapy induced neutropenia (HCC)  Resolved Problems:   * No resolved hospital problems. *  Hospital Course: Heather Burke is a 73 y.o. female with medical history significant of recently diagnosed invasive stage IV pancreatic cancer on chemotherapy, breast cancer 2016, HTN, HLD, DM, dCHF, hypothyroidism, depression, who presents with intermittent fever.   Assessment and Plan: SIRS (systemic inflammatory response syndrome) [facility]): Patient meets criteria for SIRS with WBC 23.7 and hypothermia with temperature 94.2.  Initially hypotensive, which improved to 119/53 before giving IVF.   No clear source of infection identified so far, will treat patient as SIRS.   Lactic acid is normal.  Procalcitonin 3.50.  Chest x-ray negative for infiltration.  UA showed trace amount of leukocyte with WBC 6-10, but patient denies symptoms of UTI. S/p 100 mg of solucortef as stress dose WBC initially elevated later wbc dropped to 1.1. she got a dose of Neupogen, today WBC 2.6. ID recommended fever could be from tumour advised to stop  vancomycin plus Zosyn (she received 8  days). Blood culture remained negative. Fever improved. She is stable to be discharged home. Advised to follow PCP, oncology as recommended.   Malignant neoplasm of other parts of pancreas: Patient had 1 dose of chemotherapy. Follow-up with Dr. Smith Robert, oncologist 3/26 Leukopenia and Neutropenia, oncology started filgrastim 300 mcg subcu daily from 3/27 x 4 doses 3/28 WBC 1.1 and ANC 0.2  3/29 WBC 2.6 and ANC 1.2. Follow Dr. Smith Robert as outpatient in 1 week.   Pancytopenia most likely due to chemotherapy induced bone marrow suppression S/p filgrastim. 3/28 Hb 6.7, transfused 1 unit of PRBC. Monitor CBC as outpatient and follow oncology.   Chronic diastolic CHF (congestive heart failure):  Volume overload, bilateral lower extremity edema and ascites Severe hypoalbuminemia Possible secondary to pancreatic cancer 2D echocardiogram outpatient. Will stop IV lasix. Encourage oral diet, supplements. Advised leg elevation. Follow PCP.   Hypotonic hyponatremia: Sodium 120, likely multifactorial etiology, including poor oral intake, Diovan/hydrochlorothiazide and  dehydration.  Mental status normal. Sodium seems chronically low. Today Na 132. Advised to hold hydrochlorothiazide.   Hypomagnesemia, mag repleted.  Hypokalemia due to diarrhea, potassium repleted.  Resolved.  Hypophosphatemia, Phos repleted. Monitor electrolytes as outpatient.   Hypertension associated with diabetes Stop diltiazem, Diovan-HCTZ due to softer blood pressure. Decreased metoprolol dose to 50 mg daily.  Advised to keep a log of BP for PCP to adjust/ titrate antihypertensives.   Hypothyroidism: Synthroid.  Hyperlipemia: Tricor.  Diarrhea: C. Difficile negative Diarrhea improving, started probiotics.   Metabolic acidosis: most likely due to diarrhea, resolved s/p bicarbonate oral supplement.   Alcohol dependence, daily use  -Patient stopped drinking alcohol since February   Diabetes mellitus  without  complication: Recent A1c 6.9, well-controlled.  Patient is taking Lantus 16 units daily -Sliding scale insulin   Depression Continue home medications   Protein-calorie malnutrition, moderate: Body weight 49.1 kg, BMI 19.17. encourage Ensure      Consultants: ID, oncology Procedures performed: None  Disposition: Home Diet recommendation:  Discharge Diet Orders (From admission, onward)     Start     Ordered   03/16/24 0000  Diet - low sodium heart healthy        03/16/24 0946   03/16/24 0000  Diet Carb Modified        03/16/24 0946           Cardiac and Carb modified diet DISCHARGE MEDICATION: Allergies as of 03/16/2024       Reactions   Metformin And Related Diarrhea   SEVERE DIARRHEA   Amlodipine Nausea Only, Other (See Comments)   Stomach cramping   Atorvastatin Nausea And Vomiting   Lisinopril Cough   Pravastatin Nausea And Vomiting   Icosapent Ethyl (epa Ethyl Ester) (fish) Palpitations   Nifedipine Er Other (See Comments)   headache        Medication List     STOP taking these medications    diltiazem 240 MG 24 hr capsule Commonly known as: Dilt-XR   levofloxacin 250 MG tablet Commonly known as: Levaquin   mirtazapine 15 MG tablet Commonly known as: Remeron   valsartan-hydrochlorothiazide 320-12.5 MG tablet Commonly known as: DIOVAN-HCT       TAKE these medications    Accu-Chek Guide Test test strip Generic drug: glucose blood USE 1 STIP TO CHECK BLOOD SUGAR IN THE MORNING, AT NOON, AND AT BEDTIME   Accu-Chek Softclix Lancets lancets 1 EACH BY DOES NOT APPLY ROUTE IN THE MORNING, AT NOON, AND AT BEDTIME   aspirin 81 MG tablet Take 81 mg by mouth daily.   Blood Glucose Monitoring Suppl Devi 1 each by Does not apply route in the morning, at noon, and at bedtime. May substitute to any manufacturer covered by patient's insurance.   Cholecalciferol 125 MCG (5000 UT) Tabs Take 1 tablet by mouth daily.   dexamethasone 4 MG  tablet Commonly known as: DECADRON Take 2 tablets (8 mg total) by mouth daily. Take 2 tablets daily x 3 days starting the day after chemotherapy. Take with food.   fenofibrate 145 MG tablet Commonly known as: Tricor Take 1 tablet (145 mg total) by mouth daily.   fluticasone 50 MCG/ACT nasal spray Commonly known as: FLONASE SPRAY 2 SPRAYS INTO EACH NOSTRIL EVERY DAY   folic acid 1 MG tablet Commonly known as: FOLVITE Take 1 tablet (1 mg total) by mouth daily. Start taking on: March 17, 2024   FreeStyle Libre 3 Plus Sensor Misc Place 1 sensor on the skin every 15 days. Use to check glucose continuously   insulin glargine 100 UNIT/ML Solostar Pen Commonly known as: LANTUS Inject 8 Units into the skin at bedtime. Titrate by 2 units every 3 days for fasting morning blood sugar >150. What changed: how much to take   latanoprost 0.005 % ophthalmic solution Commonly known as: XALATAN 1 drop at bedtime.   levothyroxine 88 MCG tablet Commonly known as: SYNTHROID Take 1 tablet (88 mcg total) by mouth daily.   lidocaine-prilocaine cream Commonly known as: EMLA Apply to affected area once   loperamide 2 MG capsule Commonly known as: IMODIUM Take 2 tabs by mouth with first loose stool, then 1 tab with each additional loose stool  as needed. Do not exceed 8 tabs in a 24-hour period   metoprolol succinate 50 MG 24 hr tablet Commonly known as: TOPROL-XL Take 1 tablet (50 mg total) by mouth daily. What changed:  medication strength how much to take   nicotine 21 mg/24hr patch Commonly known as: NICODERM CQ - dosed in mg/24 hours Place 1 patch (21 mg total) onto the skin daily. Start taking on: March 17, 2024   ondansetron 8 MG tablet Commonly known as: Zofran Take 1 tablet (8 mg total) by mouth every 8 (eight) hours as needed for nausea or vomiting. Start on the third day after chemotherapy   oxyCODONE 5 MG immediate release tablet Commonly known as: Oxy IR/ROXICODONE Take 1  tablet (5 mg total) by mouth every 4 (four) hours as needed for severe pain (pain score 7-10).   PEN NEEDLES 31GX5/16" 31G X 8 MM Misc 1 each by Does not apply route daily.   potassium chloride 10 MEQ tablet Commonly known as: Klor-Con M10 Take 1 tablet (10 mEq total) by mouth daily.   prochlorperazine 10 MG tablet Commonly known as: COMPAZINE Take 1 tablet (10 mg total) by mouth every 6 (six) hours as needed for nausea or vomiting (Nausea or vomiting).   Vyzulta 0.024 % Soln Generic drug: Latanoprostene Bunod Place 1 drop into both eyes at bedtime.        Follow-up Information     Sallee Provencal, FNP Follow up.   Specialty: Family Medicine Why: Hospital follow up Contact information: [address] [place] [address] [phone]                Discharge Exam: Filed Weights   03/14/24 0500 03/15/24 0500 03/16/24 0427  Weight: 52.8 kg 72.9 kg 55.1 kg      03/16/2024    8:55 AM 03/16/2024    4:27 AM 03/15/2024    8:46 PM  Vitals with BMI  Weight  121 lbs 8 oz   BMI  21.52   Systolic 132 117 213  Diastolic 55 57 58  Pulse 96 90 96   General - Elderly weak Caucasian female, no apparent distress HEENT - PERRLA, EOMI, atraumatic head, non tender sinuses. Lung - Clear, basal rales, rhonchi, no wheezes. Heart - S1, S2 heard, no murmurs, rubs, 2+ pedal edema. Abdomen - Soft, non tender, bowel sounds good Neuro - Alert, awake and oriented x 3, non focal exam. Skin - Warm and dry.  Condition at discharge: stable  The results of significant diagnostics from this hospitalization (including imaging, microbiology, ancillary and laboratory) are listed below for reference.   Imaging Studies: DG Chest 2 View Result Date: 03/08/2024 CLINICAL DATA:  Fever.  Sepsis. EXAM: CHEST - 2 VIEW COMPARISON:  Yesterday FINDINGS: Apical lordotic frontal radiograph. AP lateral views. Right Port-A-Cath tip superior caval/atrial junction. Midline trachea. Borderline  cardiomegaly. Atherosclerosis in the transverse aorta. Tiny bilateral pleural effusions are similar. No pneumothorax. No congestive failure. Similar subsegmental atelectasis at both lung bases. IMPRESSION: No significant change since one day prior. Tiny bilateral pleural effusions with subsegmental atelectasis at both lung bases. Aortic Atherosclerosis (ICD10-I70.0). Electronically Signed   By: Jeronimo Greaves M.D.   On: 03/08/2024 18:32   DG Chest 2 View Result Date: 03/07/2024 CLINICAL DATA:  Cough, fever. EXAM: CHEST - 2 VIEW COMPARISON:  None Available. FINDINGS: The heart size and mediastinal contours are within normal limits. Right internal jugular Port-A-Cath is in grossly good position. Minimal bibasilar subsegmental atelectasis is noted  with small pleural effusions. The visualized skeletal structures are unremarkable. IMPRESSION: Minimal bibasilar subsegmental atelectasis is noted with small pleural effusions. Electronically Signed   By: Lupita Raider M.D.   On: 03/07/2024 16:42   IR IMAGING GUIDED PORT INSERTION Result Date: 02/28/2024 INDICATION: 73 year old female with pancreatic cancer in need of durable venous access to allow for chemotherapy. She presents for port catheter placement. EXAM: IMPLANTED PORT A CATH PLACEMENT WITH ULTRASOUND AND FLUOROSCOPIC GUIDANCE MEDICATIONS: None. ANESTHESIA/SEDATION: Versed 1 mg IV; Fentanyl 50 mcg IV; administered by the radiology nurse Moderate Sedation Time:  10 minutes The patient's vital signs and level of consciousness were monitored during the procedure by the interventional radiology nurse under my direct supervision. FLUOROSCOPY: Radiation exposure index: 2 mGy, reference air kerma COMPLICATIONS: None immediate. PROCEDURE: The right neck and chest was prepped with chlorhexidine, and draped in the usual sterile fashion using maximum barrier technique (cap and mask, sterile gown, sterile gloves, large sterile sheet, hand hygiene and cutaneous antiseptic).  Local anesthesia was attained by infiltration with 1% lidocaine with epinephrine. Ultrasound demonstrated patency of the right internal jugular vein, and this was documented with an image. Under real-time ultrasound guidance, this vein was accessed with a 21 gauge micropuncture needle and image documentation was performed. A small dermatotomy was made at the access site with an 11 scalpel. A 0.018" wire was advanced into the SVC and the access needle exchanged for a 104F micropuncture vascular sheath. The 0.018" wire was then removed and a 0.035" wire advanced into the IVC. An appropriate location for the subcutaneous reservoir was selected below the clavicle and an incision was made through the skin and underlying soft tissues. The subcutaneous tissues were then dissected using a combination of blunt and sharp surgical technique and a pocket was formed. A Bard Clear Vue single lumen power injectable portacatheter was then tunneled through the subcutaneous tissues from the pocket to the dermatotomy and the port reservoir placed within the subcutaneous pocket. The venous access site was then serially dilated and a peel away vascular sheath placed over the wire. The wire was removed and the port catheter advanced into position under fluoroscopic guidance. The catheter tip is positioned in the superior cavoatrial junction. This was documented with a spot image. The portacatheter was then tested and found to flush and aspirate well. The port was flushed with saline followed by 100 units/mL heparinized saline. The pocket was then closed in two layers using first subdermal inverted interrupted absorbable sutures followed by a running subcuticular suture. The epidermis was then sealed with Dermabond. The dermatotomy at the venous access site was also closed with Dermabond. IMPRESSION: Successful placement of a right IJ approach Bard Clear Vue port catheter with ultrasound and fluoroscopic guidance. The catheter is ready for  use. Electronically Signed   By: Malachy Moan M.D.   On: 02/28/2024 15:28   NM PET Image Initial (PI) Skull Base To Thigh Result Date: 02/23/2024 CLINICAL DATA:  Initial treatment strategy for pancreatic carcinoma. EXAM: NUCLEAR MEDICINE PET SKULL BASE TO THIGH TECHNIQUE: 7.1 mCi F-18 FDG was injected intravenously. Full-ring PET imaging was performed from the skull base to thigh after the radiotracer. CT data was obtained and used for attenuation correction and anatomic localization. Fasting blood glucose: 75 mg/dl COMPARISON:  CT on 29/51/8841 FINDINGS: Mediastinal blood-pool activity (background): SUV max = 1.9 Liver activity (reference): SUV max = N/A NECK:  No hypermetabolic lymph nodes or masses. Incidental CT findings:  None. CHEST: 5 mm left internal  mammary chain lymph node is hypermetabolic, with SUV max of 4.3. 5 mm precardiac mediastinal lymph node is also hypermetabolic, with SUV max of 4.0. No suspicious pulmonary nodules seen on CT images. Incidental CT findings:  None. ABDOMEN/PELVIS: 3.8 x 3.6 cm mass in the pancreatic head shows intense hypermetabolism, with SUV max of 15.4, consistent with primary pancreatic carcinoma. Hypermetabolic lymphadenopathy is seen in the adjacent peripancreatic retroperitoneum, gastrohepatic ligament, porta hepatis, and portacaval space, with 1 index lymph node in the gastrohepatic ligament measuring 2.1 cm with SUV max of 14.2. Numerous hypermetabolic metastases are seen throughout liver, largest occupying nearly the entire lateral segment of the left hepatic lobe this has SUV max of 14.2. A solid mass is seen in the anterior midpole of the right kidney measuring 4.7 x 4.4 cm. This shows mild FDG uptake compared to other disease, with SUV max of 2.9. This favors primary renal cell carcinoma over renal metastasis. No hypermetabolic masses or lymphadenopathy identified within pelvis. Incidental CT findings: Prior hysterectomy. Small amount of free fluid in pelvis.  Colonic diverticulosis, without signs of diverticulitis. SKELETON: No focal hypermetabolic bone lesions to suggest skeletal metastasis. Incidental CT findings:  None. IMPRESSION: 3.8 cm hypermetabolic mass in pancreatic head, consistent with primary pancreatic carcinoma. Hypermetabolic abdominal lymphadenopathy, consistent with metastatic disease. Diffuse hypermetabolic liver metastases. 5 mm hypermetabolic left internal mammary and precardiac lymph nodes, consistent with metastatic disease. 4.7 cm solid mass in anterior midpole of right kidney, which shows mild FDG uptake. This favors primary renal cell carcinoma over renal metastasis. Electronically Signed   By: Danae Orleans M.D.   On: 02/23/2024 08:35   US BIOPSY (LIVER) Result Date: 02/20/2024 INDICATION: Multiple liver lesions, mass of the head of the pancreas, right renal mass and prior history of breast carcinoma. Clinical suspicion of metastatic pancreatic carcinoma based on recent imaging. Presenting for liver lesion biopsy. EXAM: ULTRASOUND GUIDED CORE BIOPSY OF LIVER MASS MEDICATIONS: None. ANESTHESIA/SEDATION: Moderate (conscious) sedation was employed during this procedure. A total of Versed 1.0 mg and Fentanyl 50 mcg was administered intravenously. Moderate Sedation Time: 13 minutes. The patient's level of consciousness and vital signs were monitored continuously by radiology nursing throughout the procedure under my direct supervision. PROCEDURE: The procedure, risks, benefits, and alternatives were explained to the patient. Questions regarding the procedure were encouraged and answered. The patient understands and consents to the procedure. A time-out was performed prior to initiating the procedure. The abdominal wall was prepped with chlorhexidine in a sterile fashion, and a sterile drape was applied covering the operative field. A sterile gown and sterile gloves were used for the procedure. Local anesthesia was provided with 1% Lidocaine.  Ultrasound was used to localize a lesion within the left lobe of the liver. Under ultrasound guidance, a 17 gauge trocar needle was advanced into the lesion. Three separate coaxial 18 gauge core biopsy samples were obtained and submitted in formalin. A slurry of Gel-Foam EmboCubes was then injected as the trocar needle was retracted and removed. Additional ultrasound imaging was performed. COMPLICATIONS: None immediate. FINDINGS: Confluent tumor in the left lobe of the liver was localized measuring nearly 9 cm in maximum diameter. Solid tissue was obtained. IMPRESSION: Ultrasound-guided core biopsy performed of tumor within the left lobe of the liver. Electronically Signed   By: Irish Lack M.D.   On: 02/20/2024 10:00    Microbiology: Results for orders placed or performed during the hospital encounter of 03/08/24  Resp panel by RT-PCR (RSV, Flu A&B, Covid) Anterior Nasal Swab  Status: None   Collection Time: 03/08/24  5:14 PM   Specimen: Anterior Nasal Swab  Result Value Ref Range Status   SARS Coronavirus 2 by RT PCR NEGATIVE NEGATIVE Final    Comment: (NOTE) SARS-CoV-2 target nucleic acids are NOT DETECTED.  The SARS-CoV-2 RNA is generally detectable in upper respiratory specimens during the acute phase of infection. The lowest concentration of SARS-CoV-2 viral copies this assay can detect is 138 copies/mL. A negative result does not preclude SARS-Cov-2 infection and should not be used as the sole basis for treatment or other patient management decisions. A negative result may occur with  improper specimen collection/handling, submission of specimen other than nasopharyngeal swab, presence of viral mutation(s) within the areas targeted by this assay, and inadequate number of viral copies(<138 copies/mL). A negative result must be combined with clinical observations, patient history, and epidemiological information. The expected result is Negative.  Fact Sheet for Patients:   BloggerCourse.com  Fact Sheet for Healthcare Providers:  SeriousBroker.it  This test is no t yet approved or cleared by the [place] FDA and  has been authorized for detection and/or diagnosis of SARS-CoV-2 by FDA under an Emergency Use Authorization (EUA). This EUA will remain  in effect (meaning this test can be used) for the duration of the COVID-19 declaration under Section 564(b)(1) of the Act, 21 U.S.C.section 360bbb-3(b)(1), unless the authorization is terminated  or revoked sooner.       Influenza A by PCR NEGATIVE NEGATIVE Final   Influenza B by PCR NEGATIVE NEGATIVE Final    Comment: (NOTE) The Xpert Xpress SARS-CoV-2/FLU/RSV plus assay is intended as an aid in the diagnosis of influenza from Nasopharyngeal swab specimens and should not be used as a sole basis for treatment. Nasal washings and aspirates are unacceptable for Xpert Xpress SARS-CoV-2/FLU/RSV testing.  Fact Sheet for Patients: BloggerCourse.com  Fact Sheet for Healthcare Providers: SeriousBroker.it  This test is not yet approved or cleared by the [place] FDA and has been authorized for detection and/or diagnosis of SARS-CoV-2 by FDA under an Emergency Use Authorization (EUA). This EUA will remain in effect (meaning this test can be used) for the duration of the COVID-19 declaration under Section 564(b)(1) of the Act, 21 U.S.C. section 360bbb-3(b)(1), unless the authorization is terminated or revoked.     Resp Syncytial Virus by PCR NEGATIVE NEGATIVE Final    Comment: (NOTE) Fact Sheet for Patients: BloggerCourse.com  Fact Sheet for Healthcare Providers: SeriousBroker.it  This test is not yet approved or cleared by the [place] FDA and has been authorized for detection and/or diagnosis of SARS-CoV-2 by FDA under an Emergency Use  Authorization (EUA). This EUA will remain in effect (meaning this test can be used) for the duration of the COVID-19 declaration under Section 564(b)(1) of the Act, 21 U.S.C. section 360bbb-3(b)(1), unless the authorization is terminated or revoked.  Performed at [facility], [address] [place], [place] [address]   Respiratory (~20 pathogens) panel by PCR     Status: None   Collection Time: 03/08/24  5:14 PM   Specimen: Nasopharyngeal Swab; Respiratory  Result Value Ref Range Status   Adenovirus NOT DETECTED NOT DETECTED Final   Coronavirus 229E NOT DETECTED NOT DETECTED Final    Comment: (NOTE) The Coronavirus on the Respiratory Panel, DOES NOT test for the novel  Coronavirus (2019 nCoV)    Coronavirus HKU1 NOT DETECTED NOT DETECTED Final   Coronavirus NL63 NOT DETECTED NOT DETECTED Final   Coronavirus OC43 NOT DETECTED  NOT DETECTED Final   Metapneumovirus NOT DETECTED NOT DETECTED Final   Rhinovirus / Enterovirus NOT DETECTED NOT DETECTED Final   Influenza A NOT DETECTED NOT DETECTED Final   Influenza B NOT DETECTED NOT DETECTED Final   Parainfluenza Virus 1 NOT DETECTED NOT DETECTED Final   Parainfluenza Virus 2 NOT DETECTED NOT DETECTED Final   Parainfluenza Virus 3 NOT DETECTED NOT DETECTED Final   Parainfluenza Virus 4 NOT DETECTED NOT DETECTED Final   Respiratory Syncytial Virus NOT DETECTED NOT DETECTED Final   Bordetella pertussis NOT DETECTED NOT DETECTED Final   Bordetella Parapertussis NOT DETECTED NOT DETECTED Final   Chlamydophila pneumoniae NOT DETECTED NOT DETECTED Final   Mycoplasma pneumoniae NOT DETECTED NOT DETECTED Final    Comment: Performed at [facility], [address], [place], [place] [address]  Culture, blood (routine x 2)     Status: None   Collection Time: 03/08/24  7:45 PM   Specimen: Right Antecubital; Blood  Result Value Ref Range Status   Specimen Description RIGHT ANTECUBITAL  Final   Special Requests   Final     BOTTLES DRAWN AEROBIC AND ANAEROBIC Blood Culture adequate volume   Culture   Final    NO GROWTH 5 DAYS Performed at [facility], [address] [place], [place] [address]    Report Status 03/13/2024 FINAL  Final  Culture, blood (routine x 2)     Status: None   Collection Time: 03/08/24  7:53 PM   Specimen: BLOOD RIGHT FOREARM  Result Value Ref Range Status   Specimen Description BLOOD RIGHT FOREARM  Final   Special Requests   Final    BOTTLES DRAWN AEROBIC ONLY Blood Culture results may not be optimal due to an inadequate volume of blood received in culture bottles   Culture   Final    NO GROWTH 5 DAYS Performed at [facility], [address] [place], [place] [address]    Report Status 03/13/2024 FINAL  Final  C Difficile Quick Screen w PCR reflex     Status: None   Collection Time: 03/09/24 10:00 AM  Result Value Ref Range Status   C Diff antigen NEGATIVE NEGATIVE Final   C Diff toxin NEGATIVE NEGATIVE Final   C Diff interpretation No C. difficile detected.  Final    Comment: Performed at [facility], [address] [place], [place] [address]  Gastrointestinal Panel by PCR , Stool     Status: None   Collection Time: 03/12/24  1:00 PM   Specimen: Stool  Result Value Ref Range Status   Campylobacter species NOT DETECTED NOT DETECTED Final   Plesimonas shigelloides NOT DETECTED NOT DETECTED Final   Salmonella species NOT DETECTED NOT DETECTED Final   Yersinia enterocolitica NOT DETECTED NOT DETECTED Final   Vibrio species NOT DETECTED NOT DETECTED Final   Vibrio cholerae NOT DETECTED NOT DETECTED Final   Enteroaggregative E coli (EAEC) NOT DETECTED NOT DETECTED Final   Enteropathogenic E coli (EPEC) NOT DETECTED NOT DETECTED Final   Enterotoxigenic E coli (ETEC) NOT DETECTED NOT DETECTED Final   Shiga like toxin producing E coli (STEC) NOT DETECTED NOT DETECTED Final   Shigella/Enteroinvasive E coli (EIEC) NOT DETECTED NOT DETECTED Final    Cryptosporidium NOT DETECTED NOT DETECTED Final   Cyclospora cayetanensis NOT DETECTED NOT DETECTED Final   Entamoeba histolytica NOT DETECTED NOT DETECTED Final   Giardia lamblia NOT DETECTED NOT DETECTED Final   Adenovirus F40/41 NOT DETECTED NOT DETECTED  Final   Astrovirus NOT DETECTED NOT DETECTED Final   Norovirus GI/GII NOT DETECTED NOT DETECTED Final   Rotavirus A NOT DETECTED NOT DETECTED Final   Sapovirus (I, II, IV, and V) NOT DETECTED NOT DETECTED Final    Comment: Performed at [facility], [address] [place], [place] [address]    Labs: CBC: Recent Labs  Lab 03/13/24 0530 03/14/24 0530 03/14/24 1628 03/15/24 0500 03/16/24 0430  WBC 2.4* 1.3* 1.4* 1.1* 2.6*  NEUTROABS 1.6* 0.6* 0.5* 0.2* 1.2*  HGB 7.9* 7.2* 7.4* 6.7* 8.0*  HCT 22.4* 20.6* 21.3* 19.3* 22.0*  MCV 83.6 83.7 84.5 82.8 80.9  PLT 57* 86* 102* 112* 164   Basic Metabolic Panel: Recent Labs  Lab 03/11/24 0658 03/12/24 0555 03/13/24 0530 03/14/24 0530 03/15/24 0500 03/16/24 0430  NA 127* 130* 130* 130* 131* 132*  K 3.4* 3.1* 3.8 3.6 3.2* 3.7  CL 99 100 105 103 100 103  CO2 22 22 21* 22 24 25   GLUCOSE 141* 96 97 89 110* 95  BUN 15 11 11 11 12 9   CREATININE 0.53 0.38* 0.50 0.55 0.58 0.61  CALCIUM 6.9* 7.0* 6.9* 6.9* 6.8* 7.0*  MG 1.8 1.8 1.8 1.6* 1.9  --   PHOS 2.4* 2.2* 1.7* 2.4* 2.5  --    Liver Function Tests: Recent Labs  Lab 03/09/24 1115 03/13/24 0530 03/14/24 0530 03/15/24 0500  AST 37 33 31 29  ALT 16 12 10 10   ALKPHOS 86 99 119 111  BILITOT 1.6* 1.3* 1.0 0.8  PROT 5.3* 4.6* 4.3* 4.2*  ALBUMIN 1.6* <1.5* <1.5* <1.5*   CBG: Recent Labs  Lab 03/15/24 0828 03/15/24 1132 03/15/24 1625 03/15/24 2119 03/16/24 0853  GLUCAP 92 155* 163* 101* 84    Discharge time spent: 37 minutes.  Signed: Marcelino Duster, MD [facility] 03/16/2024

## 2024-03-18 ENCOUNTER — Other Ambulatory Visit: Payer: Self-pay

## 2024-03-18 ENCOUNTER — Telehealth: Payer: Self-pay | Admitting: *Deleted

## 2024-03-18 ENCOUNTER — Encounter: Payer: Self-pay | Admitting: Oncology

## 2024-03-18 DIAGNOSIS — C257 Malignant neoplasm of other parts of pancreas: Secondary | ICD-10-CM

## 2024-03-18 NOTE — Transitions of Care (Post Inpatient/ED Visit) (Signed)
   03/18/2024  Name: Heather Burke MRN: [id number] DOB: [birth year]  Today's TOC FU Call Status: Today's TOC FU Call Status:: Unsuccessful Call (1st Attempt) Unsuccessful Call (1st Attempt) Date: 03/18/24  Attempted to reach the patient regarding the most recent Inpatient/ED visit.  Follow Up Plan: Additional outreach attempts will be made to reach the patient to complete the Transitions of Care (Post Inpatient/ED visit) call.   Gean Maidens BSN RN [facility] Health Care Management Coordinator [email or web address] Direct Dial: [phone]  Fax: [phone] Website: [email or web address]

## 2024-03-19 ENCOUNTER — Inpatient Hospital Stay

## 2024-03-19 ENCOUNTER — Inpatient Hospital Stay: Admitting: Oncology

## 2024-03-19 ENCOUNTER — Inpatient Hospital Stay: Attending: Oncology

## 2024-03-19 ENCOUNTER — Inpatient Hospital Stay (HOSPITAL_BASED_OUTPATIENT_CLINIC_OR_DEPARTMENT_OTHER): Admitting: Hospice and Palliative Medicine

## 2024-03-19 ENCOUNTER — Encounter: Payer: Self-pay | Admitting: Hospice and Palliative Medicine

## 2024-03-19 VITALS — BP 143/81 | HR 85 | Temp 99.8°F | Resp 16 | Wt 163.6 lb

## 2024-03-19 DIAGNOSIS — E119 Type 2 diabetes mellitus without complications: Secondary | ICD-10-CM | POA: Diagnosis not present

## 2024-03-19 DIAGNOSIS — I959 Hypotension, unspecified: Secondary | ICD-10-CM | POA: Diagnosis not present

## 2024-03-19 DIAGNOSIS — E876 Hypokalemia: Secondary | ICD-10-CM | POA: Insufficient documentation

## 2024-03-19 DIAGNOSIS — E785 Hyperlipidemia, unspecified: Secondary | ICD-10-CM | POA: Insufficient documentation

## 2024-03-19 DIAGNOSIS — K5730 Diverticulosis of large intestine without perforation or abscess without bleeding: Secondary | ICD-10-CM | POA: Insufficient documentation

## 2024-03-19 DIAGNOSIS — Z79890 Hormone replacement therapy (postmenopausal): Secondary | ICD-10-CM | POA: Diagnosis not present

## 2024-03-19 DIAGNOSIS — C259 Malignant neoplasm of pancreas, unspecified: Secondary | ICD-10-CM | POA: Diagnosis not present

## 2024-03-19 DIAGNOSIS — I10 Essential (primary) hypertension: Secondary | ICD-10-CM | POA: Insufficient documentation

## 2024-03-19 DIAGNOSIS — Z5111 Encounter for antineoplastic chemotherapy: Secondary | ICD-10-CM | POA: Diagnosis not present

## 2024-03-19 DIAGNOSIS — Z7982 Long term (current) use of aspirin: Secondary | ICD-10-CM | POA: Diagnosis not present

## 2024-03-19 DIAGNOSIS — C257 Malignant neoplasm of other parts of pancreas: Secondary | ICD-10-CM

## 2024-03-19 DIAGNOSIS — Z79899 Other long term (current) drug therapy: Secondary | ICD-10-CM | POA: Diagnosis not present

## 2024-03-19 DIAGNOSIS — C787 Secondary malignant neoplasm of liver and intrahepatic bile duct: Secondary | ICD-10-CM | POA: Diagnosis not present

## 2024-03-19 DIAGNOSIS — Z794 Long term (current) use of insulin: Secondary | ICD-10-CM | POA: Diagnosis not present

## 2024-03-19 DIAGNOSIS — E039 Hypothyroidism, unspecified: Secondary | ICD-10-CM | POA: Diagnosis not present

## 2024-03-19 DIAGNOSIS — E43 Unspecified severe protein-calorie malnutrition: Secondary | ICD-10-CM | POA: Diagnosis not present

## 2024-03-19 DIAGNOSIS — R600 Localized edema: Secondary | ICD-10-CM | POA: Diagnosis not present

## 2024-03-19 LAB — CMP (CANCER CENTER ONLY)
ALT: 12 U/L (ref 0–44)
AST: 35 U/L (ref 15–41)
Albumin: <1.5 g/dL — ABNORMAL LOW (ref 3.5–5.0)
Alkaline Phosphatase: 207 U/L — ABNORMAL HIGH (ref 38–126)
Anion gap: 7 (ref 5–15)
BUN: 8 mg/dL (ref 8–23)
CO2: 22 mmol/L (ref 22–32)
Calcium: 7.2 mg/dL — ABNORMAL LOW (ref 8.9–10.3)
Chloride: 98 mmol/L (ref 98–111)
Creatinine: 0.67 mg/dL (ref 0.44–1.00)
GFR, Estimated: >60 mL/min (ref >60)
Glucose, Bld: 114 mg/dL — ABNORMAL HIGH (ref 70–99)
Potassium: 3.4 mmol/L — ABNORMAL LOW (ref 3.5–5.1)
Sodium: 127 mmol/L — ABNORMAL LOW (ref 135–145)
Total Bilirubin: 0.6 mg/dL (ref 0.0–1.2)
Total Protein: 5.2 g/dL — ABNORMAL LOW (ref 6.5–8.1)

## 2024-03-19 LAB — CBC WITH DIFFERENTIAL (CANCER CENTER ONLY)
Abs Immature Granulocytes: 2.48 10*3/uL — ABNORMAL HIGH (ref 0.00–0.07)
Basophils Absolute: 0.2 10*3/uL — ABNORMAL HIGH (ref 0.0–0.1)
Basophils Relative: 1 %
Eosinophils Absolute: 0 10*3/uL (ref 0.0–0.5)
Eosinophils Relative: 0 %
HCT: 27.8 % — ABNORMAL LOW (ref 36.0–46.0)
Hemoglobin: 9.3 g/dL — ABNORMAL LOW (ref 12.0–15.0)
Immature Granulocytes: 16 %
Lymphocytes Relative: 15 %
Lymphs Abs: 2.4 10*3/uL (ref 0.7–4.0)
MCH: 28 pg (ref 26.0–34.0)
MCHC: 33.5 g/dL (ref 30.0–36.0)
MCV: 83.7 fL (ref 80.0–100.0)
Monocytes Absolute: 1.5 10*3/uL — ABNORMAL HIGH (ref 0.1–1.0)
Monocytes Relative: 9 %
Neutro Abs: 9.4 10*3/uL — ABNORMAL HIGH (ref 1.7–7.7)
Neutrophils Relative %: 59 %
Platelet Count: 326 10*3/uL (ref 150–400)
RBC: 3.32 MIL/uL — ABNORMAL LOW (ref 3.87–5.11)
RDW: 15.5 % (ref 11.5–15.5)
Smear Review: NORMAL
WBC Count: 15.8 10*3/uL — ABNORMAL HIGH (ref 4.0–10.5)
nRBC: 0 % (ref 0.0–0.2)

## 2024-03-19 MED ORDER — POTASSIUM CHLORIDE CRYS ER 10 MEQ PO TBCR: 10.0000 meq | EXTENDED_RELEASE_TABLET | Freq: Two times a day (BID) | ORAL | Status: DC

## 2024-03-19 MED ORDER — FUROSEMIDE 20 MG PO TABS: 20.0000 mg | ORAL_TABLET | Freq: Every day | ORAL | 0 refills | Status: DC

## 2024-03-19 NOTE — Progress Notes (Signed)
 Patient reports feeling very fatigued.  Edema has worsened since last visit with continued increasing weight.  Having more frequent formed bowel movements than usual.   Able tolerate oral nutrition intake better.

## 2024-03-19 NOTE — Progress Notes (Signed)
 Symptom Management Clinic [facility] at [facility] [phone] [phone]  Patient Care Team: Sallee Provencal, FNP as PCP - General (Family Medicine) Irene Limbo., MD as Consulting Physician (Ophthalmology) Ronney Asters, Jackelyn Poling, RPH-CPP as Pharmacist Benita Gutter, RN as Oncology Nurse Navigator Creig Hines, MD as Consulting Physician (Oncology)   NAME OF PATIENT: Heather Burke  [id number]  [birth year]   DATE OF VISIT: 03/19/24  REASON FOR CONSULT: Heather Burke is a 73 y.o. female with multiple medical problems including stage IV pancreatic cancer with liver metastases.  INTERVAL HISTORY: Patient saw Heather Burke on 03/05/2024 at which time she received day 1, cycle 1 modified FOLFIRINOX chemotherapy.  Patient was noted to have leukocytosis and subjective fevers.  Was started on Levaquin x 7 days.  Patient subsequently seen twice in [facility] with hypotension and ongoing fevers.  She was subsequently hospitalized 03/08/2024 to 03/16/2024 with SIRS.  No clear source of infection was identified.  Patient was treated empirically with 8 days of Vanco plus Zosyn.  ID suggested tumor fever.  Patient also was treated for hypotonic hyponatremia.  Hydrochlorothiazide was discontinued.  Today, patient states that she is tired and wants to go home.  She denies fevers today.  She did have a fever over the weekend, reportedly 100.3.  Patient continues to endorse lower extremity edema and abdominal distention.  Denies shortness of breath or chest pain.  No cough or congestion.  She reports improved appetite.  She is weak but ambulating to the bathroom with assistance.  Denies any neurologic complaints. Denies any easy bleeding or bruising.  Patient offers no further specific complaints today.   PAST MEDICAL HISTORY: Past Medical History:  Diagnosis Date   Breast cancer (HCC) 06/08/2015   T1c, N0;  ER+,PR+, Her 2 neg (FISH neg) x2., SLN x 3  negative. Mammoprint: Low risk. Multifocal.Invasive mammary and lobular carcinoma.    Diabetes mellitus without complication (HCC)    GERD (gastroesophageal reflux disease)    Heart murmur 2016   newly diagnosed, slight murmur   Hyperlipidemia    Hypertension    Hypothyroidism    Lupus    PONV (postoperative nausea and vomiting)    with Hysterectomy   Port-A-Cath in place    Thyroid disease     PAST SURGICAL HISTORY:  Past Surgical History:  Procedure Laterality Date   ABDOMINAL HYSTERECTOMY     BREAST BIOPSY Right 2002   negative/ Dr. Lemar Livings   BREAST BIOPSY Left 06-08-15   INVASIVE MAMMARY CARCINOMA WITH PARTIAL SOLID PATTERN.    BREAST BIOPSY Right 06/26/2018   FIBROADENOMATOUS CHANGES WITH STROMAL HYALINIZATION AND CALCIFICATIONS   COLONOSCOPY WITH PROPOFOL N/A 09/13/2016   Procedure: COLONOSCOPY WITH PROPOFOL;  Surgeon: Midge Minium, MD;  Location: [facility] ENDOSCOPY;  Service: Endoscopy;  Laterality: N/A;   core biopsy Left 06/08/15   GUM SURGERY  2006   IR IMAGING GUIDED PORT INSERTION  02/28/2024   MASTECTOMY Left 2016   OOPHORECTOMY     SENTINEL NODE BIOPSY Left 08/18/2015   Procedure: SENTINEL NODE BIOPSY;  Surgeon: Earline Mayotte, MD;  Location: [facility] ORS;  Service: General;  Laterality: Left;   SIMPLE MASTECTOMY WITH AXILLARY SENTINEL NODE BIOPSY Left 08/18/2015   Procedure: SIMPLE MASTECTOMY;  Surgeon: Earline Mayotte, MD;  Location: [facility] ORS;  Service: General;  Laterality: Left;   TONSILLECTOMY      HEMATOLOGY/ONCOLOGY HISTORY:  Oncology History  Malignant neoplasm of other parts of pancreas (HCC)  02/23/2024  Initial Diagnosis   Malignant neoplasm of other parts of pancreas (HCC)   02/23/2024 Cancer Staging   Staging form: Exocrine Pancreas, AJCC 8th Edition - Clinical stage from 02/23/2024: Stage IV (cT2, cN2, pM1) - Signed by Creig Hines, MD on 02/23/2024   03/05/2024 -  Chemotherapy   Patient is on Treatment Plan : PANCREAS Modified FOLFIRINOX q14d x 4 cycles        ALLERGIES:  is allergic to metformin and related, amlodipine, atorvastatin, lisinopril, pravastatin, icosapent ethyl (epa ethyl ester) (fish), and nifedipine er.  MEDICATIONS:  Current Outpatient Medications  Medication Sig Dispense Refill   ACCU-CHEK GUIDE TEST test strip USE 1 STIP TO CHECK BLOOD SUGAR IN THE MORNING, AT NOON, AND AT BEDTIME 100 strip 0   Accu-Chek Softclix Lancets lancets 1 EACH BY DOES NOT APPLY ROUTE IN THE MORNING, AT NOON, AND AT BEDTIME 100 each 0   aspirin 81 MG tablet Take 81 mg by mouth daily.     Blood Glucose Monitoring Suppl DEVI 1 each by Does not apply route in the morning, at noon, and at bedtime. May substitute to any manufacturer covered by patient's insurance. 1 each 0   Cholecalciferol 125 MCG (5000 UT) TABS Take 1 tablet by mouth daily.     Continuous Glucose Sensor (FREESTYLE LIBRE 3 PLUS SENSOR) MISC Place 1 sensor on the skin every 15 days. Use to check glucose continuously 2 each 12   dexamethasone (DECADRON) 4 MG tablet Take 2 tablets (8 mg total) by mouth daily. Take 2 tablets daily x 3 days starting the day after chemotherapy. Take with food. 30 tablet 1   fenofibrate (TRICOR) 145 MG tablet Take 1 tablet (145 mg total) by mouth daily. 90 tablet 3   fluticasone (FLONASE) 50 MCG/ACT nasal spray SPRAY 2 SPRAYS INTO EACH NOSTRIL EVERY DAY 48 mL 2   folic acid (FOLVITE) 1 MG tablet Take 1 tablet (1 mg total) by mouth daily. 90 tablet 1   insulin glargine (LANTUS) 100 UNIT/ML Solostar Pen Inject 8 Units into the skin at bedtime. Titrate by 2 units every 3 days for fasting morning blood sugar >150.     Insulin Pen Needle (PEN NEEDLES 31GX5/16") 31G X 8 MM MISC 1 each by Does not apply route daily. 100 each 11   latanoprost (XALATAN) 0.005 % ophthalmic solution 1 drop at bedtime. (Patient not taking: Reported on 02/23/2024)     levothyroxine (SYNTHROID) 88 MCG tablet Take 1 tablet (88 mcg total) by mouth daily. 90 tablet 1   lidocaine-prilocaine (EMLA)  cream Apply to affected area once 30 g 3   loperamide (IMODIUM) 2 MG capsule Take 2 tabs by mouth with first loose stool, then 1 tab with each additional loose stool as needed. Do not exceed 8 tabs in a 24-hour period 60 capsule 3   metoprolol (TOPROL-XL) 50 MG 24 hr tablet Take 1 tablet (50 mg total) by mouth daily. 30 tablet 2   nicotine (NICODERM CQ - DOSED IN MG/24 HOURS) 21 mg/24hr patch Place 1 patch (21 mg total) onto the skin daily. 28 patch 0   ondansetron (ZOFRAN) 8 MG tablet Take 1 tablet (8 mg total) by mouth every 8 (eight) hours as needed for nausea or vomiting. Start on the third day after chemotherapy 30 tablet 1   oxyCODONE (OXY IR/ROXICODONE) 5 MG immediate release tablet Take 1 tablet (5 mg total) by mouth every 4 (four) hours as needed for severe pain (pain score 7-10). 60 tablet 0  potassium chloride (KLOR-CON M10) 10 MEQ tablet Take 1 tablet (10 mEq total) by mouth daily. 90 tablet 1   prochlorperazine (COMPAZINE) 10 MG tablet Take 1 tablet (10 mg total) by mouth every 6 (six) hours as needed for nausea or vomiting (Nausea or vomiting). 30 tablet 1   VYZULTA 0.024 % SOLN Place 1 drop into both eyes at bedtime.     No current facility-administered medications for this visit.    VITAL SIGNS: There were no vitals taken for this visit. There were no vitals filed for this visit.  Estimated body mass index is 21.52 kg/m as calculated from the following:   Height as of 03/08/24: 5\' 3"  (1.6 m).   Weight as of 03/16/24: 121 lb 7.6 oz (55.1 kg).  LABS: CBC:    Component Value Date/Time   WBC 2.6 (L) 03/16/2024 0430   HGB 8.0 (L) 03/16/2024 0430   HGB 10.7 (L) 03/08/2024 1321   HGB 12.3 02/09/2024 1300   HCT 22.0 (L) 03/16/2024 0430   HCT 37.6 02/09/2024 1300   PLT 164 03/16/2024 0430   PLT 155 03/08/2024 1321   PLT 445 02/09/2024 1300   MCV 80.9 03/16/2024 0430   MCV 92 02/09/2024 1300   NEUTROABS 1.2 (L) 03/16/2024 0430   NEUTROABS 6.7 02/09/2024 1300   LYMPHSABS 1.0  03/16/2024 0430   LYMPHSABS 1.1 02/09/2024 1300   MONOABS 0.4 03/16/2024 0430   EOSABS 0.0 03/16/2024 0430   EOSABS 0.1 02/09/2024 1300   BASOSABS 0.0 03/16/2024 0430   BASOSABS 0.0 02/09/2024 1300   Comprehensive Metabolic Panel:    Component Value Date/Time   NA 132 (L) 03/16/2024 0430   NA 128 (L) 08/09/2023 1032   K 3.7 03/16/2024 0430   CL 103 03/16/2024 0430   CO2 25 03/16/2024 0430   BUN 9 03/16/2024 0430   BUN 8 08/09/2023 1032   CREATININE 0.61 03/16/2024 0430   CREATININE 0.71 03/08/2024 1321   GLUCOSE 95 03/16/2024 0430   CALCIUM 7.0 (L) 03/16/2024 0430   AST 29 03/15/2024 0500   AST 45 (H) 03/08/2024 1321   ALT 10 03/15/2024 0500   ALT 19 03/08/2024 1321   ALKPHOS 111 03/15/2024 0500   BILITOT 0.8 03/15/2024 0500   BILITOT 1.6 (H) 03/08/2024 1321   PROT 4.2 (L) 03/15/2024 0500   PROT 7.1 08/09/2023 1032   ALBUMIN <1.5 (L) 03/15/2024 0500   ALBUMIN 4.1 08/09/2023 1032    RADIOGRAPHIC STUDIES: DG Chest 2 View Result Date: 03/08/2024 CLINICAL DATA:  Fever.  Sepsis. EXAM: CHEST - 2 VIEW COMPARISON:  Yesterday FINDINGS: Apical lordotic frontal radiograph. AP lateral views. Right Port-A-Cath tip superior caval/atrial junction. Midline trachea. Borderline cardiomegaly. Atherosclerosis in the transverse aorta. Tiny bilateral pleural effusions are similar. No pneumothorax. No congestive failure. Similar subsegmental atelectasis at both lung bases. IMPRESSION: No significant change since one day prior. Tiny bilateral pleural effusions with subsegmental atelectasis at both lung bases. Aortic Atherosclerosis (ICD10-I70.0). Electronically Signed   By: Jeronimo Greaves M.D.   On: 03/08/2024 18:32   DG Chest 2 View Result Date: 03/07/2024 CLINICAL DATA:  Cough, fever. EXAM: CHEST - 2 VIEW COMPARISON:  None Available. FINDINGS: The heart size and mediastinal contours are within normal limits. Right internal jugular Port-A-Cath is in grossly good position. Minimal bibasilar  subsegmental atelectasis is noted with small pleural effusions. The visualized skeletal structures are unremarkable. IMPRESSION: Minimal bibasilar subsegmental atelectasis is noted with small pleural effusions. Electronically Signed   By: Zenda Alpers.D.  On: 03/07/2024 16:42   IR IMAGING GUIDED PORT INSERTION Result Date: 02/28/2024 INDICATION: 73 year old female with pancreatic cancer in need of durable venous access to allow for chemotherapy. She presents for port catheter placement. EXAM: IMPLANTED PORT A CATH PLACEMENT WITH ULTRASOUND AND FLUOROSCOPIC GUIDANCE MEDICATIONS: None. ANESTHESIA/SEDATION: Versed 1 mg IV; Fentanyl 50 mcg IV; administered by the radiology nurse Moderate Sedation Time:  10 minutes The patient's vital signs and level of consciousness were monitored during the procedure by the interventional radiology nurse under my direct supervision. FLUOROSCOPY: Radiation exposure index: 2 mGy, reference air kerma COMPLICATIONS: None immediate. PROCEDURE: The right neck and chest was prepped with chlorhexidine, and draped in the usual sterile fashion using maximum barrier technique (cap and mask, sterile gown, sterile gloves, large sterile sheet, hand hygiene and cutaneous antiseptic). Local anesthesia was attained by infiltration with 1% lidocaine with epinephrine. Ultrasound demonstrated patency of the right internal jugular vein, and this was documented with an image. Under real-time ultrasound guidance, this vein was accessed with a 21 gauge micropuncture needle and image documentation was performed. A small dermatotomy was made at the access site with an 11 scalpel. A 0.018" wire was advanced into the SVC and the access needle exchanged for a 41F micropuncture vascular sheath. The 0.018" wire was then removed and a 0.035" wire advanced into the IVC. An appropriate location for the subcutaneous reservoir was selected below the clavicle and an incision was made through the skin and  underlying soft tissues. The subcutaneous tissues were then dissected using a combination of blunt and sharp surgical technique and a pocket was formed. A Bard Clear Vue single lumen power injectable portacatheter was then tunneled through the subcutaneous tissues from the pocket to the dermatotomy and the port reservoir placed within the subcutaneous pocket. The venous access site was then serially dilated and a peel away vascular sheath placed over the wire. The wire was removed and the port catheter advanced into position under fluoroscopic guidance. The catheter tip is positioned in the superior cavoatrial junction. This was documented with a spot image. The portacatheter was then tested and found to flush and aspirate well. The port was flushed with saline followed by 100 units/mL heparinized saline. The pocket was then closed in two layers using first subdermal inverted interrupted absorbable sutures followed by a running subcuticular suture. The epidermis was then sealed with Dermabond. The dermatotomy at the venous access site was also closed with Dermabond. IMPRESSION: Successful placement of a right IJ approach Bard Clear Vue port catheter with ultrasound and fluoroscopic guidance. The catheter is ready for use. Electronically Signed   By: Malachy Moan M.D.   On: 02/28/2024 15:28   NM PET Image Initial (PI) Skull Base To Thigh Result Date: 02/23/2024 CLINICAL DATA:  Initial treatment strategy for pancreatic carcinoma. EXAM: NUCLEAR MEDICINE PET SKULL BASE TO THIGH TECHNIQUE: 7.1 mCi F-18 FDG was injected intravenously. Full-ring PET imaging was performed from the skull base to thigh after the radiotracer. CT data was obtained and used for attenuation correction and anatomic localization. Fasting blood glucose: 75 mg/dl COMPARISON:  CT on 16/09/9603 FINDINGS: Mediastinal blood-pool activity (background): SUV max = 1.9 Liver activity (reference): SUV max = N/A NECK:  No hypermetabolic lymph nodes or  masses. Incidental CT findings:  None. CHEST: 5 mm left internal mammary chain lymph node is hypermetabolic, with SUV max of 4.3. 5 mm precardiac mediastinal lymph node is also hypermetabolic, with SUV max of 4.0. No suspicious pulmonary nodules seen on CT images.  Incidental CT findings:  None. ABDOMEN/PELVIS: 3.8 x 3.6 cm mass in the pancreatic head shows intense hypermetabolism, with SUV max of 15.4, consistent with primary pancreatic carcinoma. Hypermetabolic lymphadenopathy is seen in the adjacent peripancreatic retroperitoneum, gastrohepatic ligament, porta hepatis, and portacaval space, with 1 index lymph node in the gastrohepatic ligament measuring 2.1 cm with SUV max of 14.2. Numerous hypermetabolic metastases are seen throughout liver, largest occupying nearly the entire lateral segment of the left hepatic lobe this has SUV max of 14.2. A solid mass is seen in the anterior midpole of the right kidney measuring 4.7 x 4.4 cm. This shows mild FDG uptake compared to other disease, with SUV max of 2.9. This favors primary renal cell carcinoma over renal metastasis. No hypermetabolic masses or lymphadenopathy identified within pelvis. Incidental CT findings: Prior hysterectomy. Small amount of free fluid in pelvis. Colonic diverticulosis, without signs of diverticulitis. SKELETON: No focal hypermetabolic bone lesions to suggest skeletal metastasis. Incidental CT findings:  None. IMPRESSION: 3.8 cm hypermetabolic mass in pancreatic head, consistent with primary pancreatic carcinoma. Hypermetabolic abdominal lymphadenopathy, consistent with metastatic disease. Diffuse hypermetabolic liver metastases. 5 mm hypermetabolic left internal mammary and precardiac lymph nodes, consistent with metastatic disease. 4.7 cm solid mass in anterior midpole of right kidney, which shows mild FDG uptake. This favors primary renal cell carcinoma over renal metastasis. Electronically Signed   By: Danae Orleans M.D.   On: 02/23/2024  08:35   US BIOPSY (LIVER) Result Date: 02/20/2024 INDICATION: Multiple liver lesions, mass of the head of the pancreas, right renal mass and prior history of breast carcinoma. Clinical suspicion of metastatic pancreatic carcinoma based on recent imaging. Presenting for liver lesion biopsy. EXAM: ULTRASOUND GUIDED CORE BIOPSY OF LIVER MASS MEDICATIONS: None. ANESTHESIA/SEDATION: Moderate (conscious) sedation was employed during this procedure. A total of Versed 1.0 mg and Fentanyl 50 mcg was administered intravenously. Moderate Sedation Time: 13 minutes. The patient's level of consciousness and vital signs were monitored continuously by radiology nursing throughout the procedure under my direct supervision. PROCEDURE: The procedure, risks, benefits, and alternatives were explained to the patient. Questions regarding the procedure were encouraged and answered. The patient understands and consents to the procedure. A time-out was performed prior to initiating the procedure. The abdominal wall was prepped with chlorhexidine in a sterile fashion, and a sterile drape was applied covering the operative field. A sterile gown and sterile gloves were used for the procedure. Local anesthesia was provided with 1% Lidocaine. Ultrasound was used to localize a lesion within the left lobe of the liver. Under ultrasound guidance, a 17 gauge trocar needle was advanced into the lesion. Three separate coaxial 18 gauge core biopsy samples were obtained and submitted in formalin. A slurry of Gel-Foam EmboCubes was then injected as the trocar needle was retracted and removed. Additional ultrasound imaging was performed. COMPLICATIONS: None immediate. FINDINGS: Confluent tumor in the left lobe of the liver was localized measuring nearly 9 cm in maximum diameter. Solid tissue was obtained. IMPRESSION: Ultrasound-guided core biopsy performed of tumor within the left lobe of the liver. Electronically Signed   By: Irish Lack M.D.   On:  02/20/2024 10:00    PERFORMANCE STATUS (ECOG) : 2 - Symptomatic, <50% confined to bed  Review of Systems Unless otherwise noted, a complete review of systems is negative.  Physical Exam General: Frail appearing Cardiovascular: regular rate and rhythm Pulmonary: clear ant fields Abdomen: soft, nontender, + bowel sounds GU: no suprapubic tenderness Extremities: no edema, no joint deformities Skin: no  rashes Neurological: Weakness but otherwise nonfocal  IMPRESSION/PLAN: Stage IV pancreatic cancer -status post cycle 1 modified FOLFIRINOX  Edema -weight has been trending up over the past 2 weeks.  Weight today is 163 pounds, up from 136 pounds on 03/05/2024.  Patient does appear to have anasarca with positive fluid wave of her abdomen suspicious for ascites.  Patient was diuresed in the hospital with some improvement in symptoms but diuretics were not continued upon discharge.  Case and plan discussed with Heather Burke who recommended restarting furosemide 20 mg daily.  Will obtain echocardiogram.  Most likely, symptoms are secondary to severe hypoalbuminemia.  Hypokalemia -we will increase potassium supplement to 20 mEq daily  ACP -patient sent home with ACP documents and MOST form to review with family.  At this time, patient's goals are still aligned with proceeding with future treatment but she wants to have a short holiday to try to feel better.  Patient opted for full code during recent hospitalization.  Patient will RTC next week to see Heather Burke.  Patient agrees to call clinic if she needs to be seen sooner.  Patient expressed understanding and was in agreement with this plan. She also understands that She can call clinic at any time with any questions, concerns, or complaints.   Thank you for allowing me to participate in the care of this very pleasant patient.   Time Total: 25 minutes  Visit consisted of counseling and education dealing with the complex and emotionally intense issues  of symptom management in the setting of serious illness.Greater than 50%  of this time was spent counseling and coordinating care related to the above assessment and plan.  Signed by: Laurette Schimke, PhD, NP-C

## 2024-03-20 ENCOUNTER — Telehealth: Payer: Self-pay | Admitting: *Deleted

## 2024-03-20 ENCOUNTER — Ambulatory Visit
Admission: RE | Admit: 2024-03-20 | Discharge: 2024-03-20 | Disposition: A | Discharge status: Home / Self Care | Source: Ambulatory Visit | Attending: Hospice and Palliative Medicine | Admitting: Hospice and Palliative Medicine

## 2024-03-20 DIAGNOSIS — I5032 Chronic diastolic (congestive) heart failure: Secondary | ICD-10-CM | POA: Diagnosis not present

## 2024-03-20 DIAGNOSIS — E46 Unspecified protein-calorie malnutrition: Secondary | ICD-10-CM | POA: Diagnosis not present

## 2024-03-20 DIAGNOSIS — R600 Localized edema: Secondary | ICD-10-CM | POA: Diagnosis not present

## 2024-03-20 DIAGNOSIS — E43 Unspecified severe protein-calorie malnutrition: Secondary | ICD-10-CM

## 2024-03-20 DIAGNOSIS — I517 Cardiomegaly: Secondary | ICD-10-CM | POA: Diagnosis not present

## 2024-03-20 LAB — ECHOCARDIOGRAM COMPLETE
AR max vel: 2.73 cm2
AV Area VTI: 3.25 cm2
AV Area mean vel: 2.76 cm2
AV Mean grad: 3 mmHg
AV Peak grad: 6.7 mmHg
Ao pk vel: 1.29 m/s
Area-P 1/2: 3.95 cm2
MV VTI: 2.27 cm2
S' Lateral: 2.4 cm

## 2024-03-20 NOTE — Transitions of Care (Post Inpatient/ED Visit) (Signed)
   03/20/2024  Name: DONATA REDDICK MRN: [id number] DOB: Mar 03, 1951  Today's TOC FU Call Status: Today's TOC FU Call Status:: Unsuccessful Call (2nd Attempt) Unsuccessful Call (2nd Attempt) Date: 03/20/24  Attempted to reach the patient regarding the most recent Inpatient/ED visit.  Follow Up Plan: Additional outreach attempts will be made to reach the patient to complete the Transitions of Care (Post Inpatient/ED visit) call.   Gean Maidens BSN RN [facility] Care Management Coordinator [email or web address] Direct Dial: [phone]  Fax: [phone] Website: [email or web address]

## 2024-03-20 NOTE — Progress Notes (Signed)
*  PRELIMINARY RESULTS* Echocardiogram 2D Echocardiogram has been performed.  Heather Burke 03/20/2024, 9:27 AM

## 2024-03-21 ENCOUNTER — Encounter: Payer: Self-pay | Admitting: Oncology

## 2024-03-21 ENCOUNTER — Inpatient Hospital Stay

## 2024-03-21 ENCOUNTER — Telehealth: Payer: Self-pay

## 2024-03-21 NOTE — Transitions of Care (Post Inpatient/ED Visit) (Signed)
   03/21/2024  Name: NALAH MACIOCE MRN: [id number] DOB: [birth year]  Today's TOC FU Call Status: Today's TOC FU Call Status:: Unsuccessful Call (3rd Attempt) Unsuccessful Call (3rd Attempt) Date: 03/21/24  Attempted to reach the patient regarding the most recent Inpatient/ED visit.  Follow Up Plan: No further outreach attempts will be made at this time. We have been unable to contact the patient.  Bary Leriche RN, MSN [facility], [facility] RN Care Manager Direct Dial: [phone]  Fax: [phone] Website: [email or web address]

## 2024-03-25 ENCOUNTER — Encounter: Payer: Self-pay | Admitting: Oncology

## 2024-03-25 MED FILL — Fosaprepitant Dimeglumine For IV Infusion 150 MG (Base Eq): INTRAVENOUS | Qty: 5 | Status: AC

## 2024-03-26 ENCOUNTER — Other Ambulatory Visit: Payer: Self-pay

## 2024-03-26 ENCOUNTER — Encounter: Payer: Self-pay | Admitting: Oncology

## 2024-03-26 ENCOUNTER — Inpatient Hospital Stay

## 2024-03-26 ENCOUNTER — Inpatient Hospital Stay (HOSPITAL_BASED_OUTPATIENT_CLINIC_OR_DEPARTMENT_OTHER): Admitting: Oncology

## 2024-03-26 VITALS — BP 146/65 | HR 77 | Temp 99.0°F | Resp 18 | Ht 63.0 in | Wt 156.6 lb

## 2024-03-26 DIAGNOSIS — Z7189 Other specified counseling: Secondary | ICD-10-CM | POA: Diagnosis not present

## 2024-03-26 DIAGNOSIS — E8809 Other disorders of plasma-protein metabolism, not elsewhere classified: Secondary | ICD-10-CM

## 2024-03-26 DIAGNOSIS — D649 Anemia, unspecified: Secondary | ICD-10-CM

## 2024-03-26 DIAGNOSIS — C257 Malignant neoplasm of other parts of pancreas: Secondary | ICD-10-CM | POA: Diagnosis not present

## 2024-03-26 DIAGNOSIS — Z5111 Encounter for antineoplastic chemotherapy: Secondary | ICD-10-CM | POA: Diagnosis not present

## 2024-03-26 DIAGNOSIS — Z95828 Presence of other vascular implants and grafts: Secondary | ICD-10-CM

## 2024-03-26 LAB — CBC WITH DIFFERENTIAL (CANCER CENTER ONLY)
Abs Immature Granulocytes: 0.31 10*3/uL — ABNORMAL HIGH (ref 0.00–0.07)
Basophils Absolute: 0.1 10*3/uL (ref 0.0–0.1)
Basophils Relative: 1 %
Eosinophils Absolute: 0 10*3/uL (ref 0.0–0.5)
Eosinophils Relative: 0 %
HCT: 23.1 % — ABNORMAL LOW (ref 36.0–46.0)
Hemoglobin: 7.6 g/dL — ABNORMAL LOW (ref 12.0–15.0)
Immature Granulocytes: 4 %
Lymphocytes Relative: 24 %
Lymphs Abs: 2 10*3/uL (ref 0.7–4.0)
MCH: 28.4 pg (ref 26.0–34.0)
MCHC: 32.9 g/dL (ref 30.0–36.0)
MCV: 86.2 fL (ref 80.0–100.0)
Monocytes Absolute: 0.9 10*3/uL (ref 0.1–1.0)
Monocytes Relative: 10 %
Neutro Abs: 5 10*3/uL (ref 1.7–7.7)
Neutrophils Relative %: 61 %
Platelet Count: 323 10*3/uL (ref 150–400)
RBC: 2.68 MIL/uL — ABNORMAL LOW (ref 3.87–5.11)
RDW: 16 % — ABNORMAL HIGH (ref 11.5–15.5)
WBC Count: 8.2 10*3/uL (ref 4.0–10.5)
nRBC: 0 % (ref 0.0–0.2)

## 2024-03-26 LAB — CMP (CANCER CENTER ONLY)
ALT: 14 U/L (ref 0–44)
AST: 35 U/L (ref 15–41)
Albumin: <1.5 g/dL — ABNORMAL LOW (ref 3.5–5.0)
Alkaline Phosphatase: 172 U/L — ABNORMAL HIGH (ref 38–126)
Anion gap: 5 (ref 5–15)
BUN: 6 mg/dL — ABNORMAL LOW (ref 8–23)
CO2: 24 mmol/L (ref 22–32)
Calcium: 7.1 mg/dL — ABNORMAL LOW (ref 8.9–10.3)
Chloride: 98 mmol/L (ref 98–111)
Creatinine: 0.41 mg/dL — ABNORMAL LOW (ref 0.44–1.00)
GFR, Estimated: >60 mL/min (ref >60)
Glucose, Bld: 149 mg/dL — ABNORMAL HIGH (ref 70–99)
Potassium: 3.7 mmol/L (ref 3.5–5.1)
Sodium: 127 mmol/L — ABNORMAL LOW (ref 135–145)
Total Bilirubin: 0.5 mg/dL (ref 0.0–1.2)
Total Protein: 5.6 g/dL — ABNORMAL LOW (ref 6.5–8.1)

## 2024-03-26 MED ORDER — HEPARIN SOD (PORK) LOCK FLUSH 100 UNIT/ML IV SOLN
500.0000 [IU] | Freq: Once | INTRAVENOUS | Status: AC
  Administered 2024-03-26: 500 [IU]
  Filled 2024-03-26: qty 5

## 2024-03-26 MED ORDER — FUROSEMIDE 20 MG PO TABS: 20.0000 mg | ORAL_TABLET | Freq: Two times a day (BID) | ORAL | 2 refills | Status: DC

## 2024-03-27 ENCOUNTER — Inpatient Hospital Stay

## 2024-03-27 ENCOUNTER — Encounter: Payer: Self-pay | Admitting: Oncology

## 2024-03-27 DIAGNOSIS — Z5111 Encounter for antineoplastic chemotherapy: Secondary | ICD-10-CM | POA: Diagnosis not present

## 2024-03-27 DIAGNOSIS — D649 Anemia, unspecified: Secondary | ICD-10-CM

## 2024-03-27 LAB — PREPARE RBC (CROSSMATCH)

## 2024-03-27 MED ORDER — FUROSEMIDE 10 MG/ML IJ SOLN
20.0000 mg | Freq: Once | INTRAMUSCULAR | Status: AC
  Administered 2024-03-27: 20 mg via INTRAVENOUS
  Filled 2024-03-27: qty 2

## 2024-03-27 MED ORDER — HEPARIN SOD (PORK) LOCK FLUSH 100 UNIT/ML IV SOLN
500.0000 [IU] | Freq: Every day | INTRAVENOUS | Status: AC | PRN
  Administered 2024-03-27: 500 [IU]
  Filled 2024-03-27: qty 5

## 2024-03-27 MED ORDER — SODIUM CHLORIDE 0.9% IV SOLUTION
250.0000 mL | INTRAVENOUS | Status: DC
  Administered 2024-03-27: 50 mL via INTRAVENOUS
  Filled 2024-03-27: qty 250

## 2024-03-27 NOTE — Progress Notes (Signed)
 Nutrition Assessment   Reason for Assessment:  New pancreatic cancer   ASSESSMENT:  73 year old female with stage IV pancreatic cancer with liver mets.  Past medical history of breast cancer in 2016, DM, HLD, HTN, etoh use, mastectomy.  Patient receiving modified folfirinox.    Met with patient and daughter during infusion.  Reports that appetite was not good the week after treatment (3/18).  Was admitted to the hospital on 3/21-3/29 for SIRS.  Since being home from the hospital appetite is better.  This morning ate sausage biscuit for breakfast.  Last night ate slice of meat pizza and breadstick.  Has been drinking gatorlyte, lemonade.  Boost Breeze was really sweet for patient and tried to dilute with water.  Has been drinking Fairlife shakes.  Eating graham crackers and juice in the middle of the night if has low blood glucose.      Nutrition Focused Physical Exam:   Deferred. Patient wrapped up in warm blankets, cold.   Medications: lantus, folic acid, KCL, compazine, zofran, lasix   Labs: Na 127, glucose 149   Anthropometrics:   Height: 63 inches Weight: 156 lb 9.6 oz (increased from fluid) UBW: 172 lb about 1 year ago.   136 lb 12.8 oz on 3/18 at start of treatment BMI: 27   NUTRITION DIAGNOSIS: Inadequate oral intake related to cancer and related treatment side effects as evidenced by reduced po intake and weight loss (edema masking at this time)   INTERVENTION:  Encouraged small frequent "nibbles" Encouraged trying Fairlife milk as regular milk upsets stomach Continue Fairlife shakes as often as able Continue protein rich foods to help with swelling likely not going to affect albumin level as effected more so by inflammation vs malnutrition.  Contact information provided    MONITORING, EVALUATION, GOAL: weight trends, intake   Next Visit: Tuesday, April 22 during infusion  Librado Guandique B. Elease Hashimoto, CSO, LDN Registered Dietitian [phone]

## 2024-03-27 NOTE — Patient Instructions (Signed)

## 2024-03-28 ENCOUNTER — Inpatient Hospital Stay

## 2024-03-28 LAB — TYPE AND SCREEN
ABO/RH(D): A POS
Antibody Screen: POSITIVE
DAT, IgG: NEGATIVE
DAT, complement: NEGATIVE
Donor AG Type: NEGATIVE
Unit division: 0

## 2024-03-28 LAB — BPAM RBC
Blood Product Expiration Date: 202504282359
ISSUE DATE / TIME: 202504090949
Unit Type and Rh: 6200

## 2024-03-31 ENCOUNTER — Encounter: Payer: Self-pay | Admitting: Oncology

## 2024-03-31 NOTE — Progress Notes (Signed)
 Hematology/Oncology Consult note [facility]  [phone] [phone]  Patient Care Team: Tasia Farr, FNP as PCP - General (Family Medicine) Bary Boss., MD as Consulting Physician (Ophthalmology) Alla Isaacs, Severa Daniels, RPH-CPP as Pharmacist Rochell Chroman, RN as Oncology Nurse Navigator Avonne Boettcher, MD as Consulting Physician (Oncology)   Name of the patient: Heather Burke  [id number]  12-Mar-1951   Date of visit: 03/31/24  Diagnosis-metastatic pancreatic cancer with liver metastases  Chief complaint/ Reason for visit-on treatment assessment prior to cycle 2 of modified FOLFIRINOX chemotherapy  Heme/Onc history: Patient is a 73 year old female diagnosed with metastatic pancreatic cancer with liver metastases in March 2025.  CA 19-9 elevated at 4720.Biopsy was consistent with poorly differentiated adenocarcinoma that was positive for CK7.  CK20 GATA3 ER CDX2 PAX8 SOX10 and TTF-1 are negative.  Diagnostic considerations include primary intrahepatic cholangiocarcinoma and metastatic adenocarcinoma of pancreaticobiliary or upper GI origin.  Given the clinical picture of uncinate process mass in the pancreas this would be consistent with metastatic pancreatic cancer in the setting of elevated CA 19-9.    She received 1 cycle of modified FOLFIRINOX chemotherapy on on 03/05/2024.  Treatment complicated by hospitalization and severe fatigue.  She has ascites and bilateral lower extremity edema secondary to severe hypoalbuminemia.  Echocardiogram normal.  Interval history-patient is now 3 weeks out of cycle 1 of chemotherapy but continues to feel significantly fatigued.  She is spending most of her time resting.  Appetite is poor.  ECOG PS- 3 Pain scale- 3   Review of systems- Review of Systems  Constitutional:  Positive for malaise/fatigue. Negative for chills, fever and weight loss.  HENT:  Negative for congestion, ear discharge  and nosebleeds.   Eyes:  Negative for blurred vision.  Respiratory:  Negative for cough, hemoptysis, sputum production, shortness of breath and wheezing.   Cardiovascular:  Positive for leg swelling. Negative for chest pain, palpitations, orthopnea and claudication.  Gastrointestinal:  Negative for abdominal pain, blood in stool, constipation, diarrhea, heartburn, melena, nausea and vomiting.  Genitourinary:  Negative for dysuria, flank pain, frequency, hematuria and urgency.  Musculoskeletal:  Negative for back pain, joint pain and myalgias.  Skin:  Negative for rash.  Neurological:  Negative for dizziness, tingling, focal weakness, seizures, weakness and headaches.  Endo/Heme/Allergies:  Does not bruise/bleed easily.  Psychiatric/Behavioral:  Negative for depression and suicidal ideas. The patient does not have insomnia.       Allergies  Allergen Reactions   Metformin And Related Diarrhea    SEVERE DIARRHEA   Amlodipine Nausea Only and Other (See Comments)    Stomach cramping   Atorvastatin Nausea And Vomiting   Lisinopril Cough   Pravastatin Nausea And Vomiting   Icosapent Ethyl (Epa Ethyl Ester) (Fish) Palpitations   Nifedipine Er Other (See Comments)    headache     Past Medical History:  Diagnosis Date   Breast cancer (HCC) 06/08/2015   T1c, N0;  ER+,PR+, Her 2 neg (FISH neg) x2., SLN x 3 negative. Mammoprint: Low risk. Multifocal.Invasive mammary and lobular carcinoma.    Diabetes mellitus without complication (HCC)    GERD (gastroesophageal reflux disease)    Heart murmur 2016   newly diagnosed, slight murmur   Hyperlipidemia    Hypertension    Hypothyroidism    Lupus    PONV (postoperative nausea and vomiting)    with Hysterectomy   Port-A-Cath in place    Thyroid disease      Past  Surgical History:  Procedure Laterality Date   ABDOMINAL HYSTERECTOMY     BREAST BIOPSY Right 2002   negative/ Dr. Marquita Situ   BREAST BIOPSY Left 06-08-15   INVASIVE MAMMARY  CARCINOMA WITH PARTIAL SOLID PATTERN.    BREAST BIOPSY Right 06/26/2018   FIBROADENOMATOUS CHANGES WITH STROMAL HYALINIZATION AND CALCIFICATIONS   COLONOSCOPY WITH PROPOFOL N/A 09/13/2016   Procedure: COLONOSCOPY WITH PROPOFOL;  Surgeon: Marnee Sink, MD;  Location: [facility] END[facility];  Service: Endoscopy;  Laterality: N/A;   core biopsy Left 06/08/15   GUM SURGERY  2006   IR IMAGING GUIDED PORT INSERTION  02/28/2024   MASTECTOMY Left 2016   OOPHORECTOMY     SENTINEL NODE BIOPSY Left 08/18/2015   Procedure: SENTINEL NODE BIOPSY;  Surgeon: Marshall Skeeter, MD;  Location: [facility] ORS;  Service: General;  Laterality: Left;   SIMPLE MASTECTOMY WITH AXILLARY SENTINEL NODE BIOPSY Left 08/18/2015   Procedure: SIMPLE MASTECTOMY;  Surgeon: Marshall Skeeter, MD;  Location: [facility] ORS;  Service: General;  Laterality: Left;   TONSILLECTOMY      Social History   Socioeconomic History   Marital status: Widowed    Spouse name: Not on file   Number of children: 2   Years of education: H/S   Highest education level: 12th grade  Occupational History   Occupation: Part-Time    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 PT states no beer since 02/10/24   Drug use: No   Sexual activity: Not on file  Other Topics Concern   Not on file  Social History Narrative   Not on file   Social Drivers of Health   Financial Resource Strain: Low Risk  (12/26/2023)   Overall Financial Resource Strain (CARDIA)    Difficulty of Paying Living Expenses: Not  hard at all  Food Insecurity: No Food Insecurity (03/08/2024)   Hunger Vital Sign    Worried About Running Out of Food in the Last Year: Never true    Ran Out of Food in the Last Year: Never true  Transportation Needs: No Transportation Needs (03/08/2024)   PRAPARE - Administrator, Civil Service (Medical): No    Lack of Transportation (Non-Medical): No  Physical Activity: Insufficiently Active (12/26/2023)   Exercise Vital Sign    Days of Exercise per Week: 4 days    Minutes of Exercise per Session: 30 min  Stress: No Stress Concern Present (12/26/2023)   [employer] of Occupational Health - Occupational Stress Questionnaire    Feeling of Stress : Not at all  Social Connections: Moderately Isolated (03/08/2024)   Social Connection and Isolation Panel [NHANES]    Frequency of Communication with Friends and Family: More than three times a week    Frequency of Social Gatherings with Friends and Family: More than three times a week    Attends Religious Services: 1 to 4 times per year  Active Member of Clubs or Organizations: No    Attends Banker Meetings: Never    Marital Status: Widowed  Intimate Partner Violence: Not At Risk (03/08/2024)   Humiliation, Afraid, Rape, and Kick questionnaire    Fear of Current or Ex-Partner: No    Emotionally Abused: No    Physically Abused: No    Sexually Abused: No    Family History  Problem Relation Age of Onset   Cancer Father        prostate   Thyroid disease Daughter    Heart attack Sister    Breast cancer Cousin    Breast cancer Cousin    Breast cancer Other      Current Outpatient Medications:    ACCU-CHEK GUIDE TEST test strip, USE 1 STIP TO CHECK BLOOD SUGAR IN THE MORNING, AT NOON, AND AT BEDTIME, Disp: 100 strip, Rfl: 0   Accu-Chek Softclix Lancets lancets, 1 EACH BY DOES NOT APPLY ROUTE IN THE MORNING, AT NOON, AND AT BEDTIME, Disp: 100 each, Rfl: 0   aspirin 81 MG tablet, Take 81 mg by mouth daily.,  Disp: , Rfl:    Blood Glucose Monitoring Suppl DEVI, 1 each by Does not apply route in the morning, at noon, and at bedtime. May substitute to any manufacturer covered by patient's insurance., Disp: 1 each, Rfl: 0   Cholecalciferol 125 MCG (5000 UT) TABS, Take 1 tablet by mouth daily., Disp: , Rfl:    Continuous Glucose Sensor (FREESTYLE LIBRE 3 PLUS SENSOR) MISC, Place 1 sensor on the skin every 15 days. Use to check glucose continuously, Disp: 2 each, Rfl: 12   dexamethasone (DECADRON) 4 MG tablet, Take 2 tablets (8 mg total) by mouth daily. Take 2 tablets daily x 3 days starting the day after chemotherapy. Take with food., Disp: 30 tablet, Rfl: 1   fenofibrate (TRICOR) 145 MG tablet, Take 1 tablet (145 mg total) by mouth daily., Disp: 90 tablet, Rfl: 3   fluticasone (FLONASE) 50 MCG/ACT nasal spray, SPRAY 2 SPRAYS INTO EACH NOSTRIL EVERY DAY, Disp: 48 mL, Rfl: 2   folic acid (FOLVITE) 1 MG tablet, Take 1 tablet (1 mg total) by mouth daily., Disp: 90 tablet, Rfl: 1   furosemide (LASIX) 20 MG tablet, Take 1 tablet (20 mg total) by mouth 2 (two) times daily., Disp: 60 tablet, Rfl: 2   insulin glargine (LANTUS) 100 UNIT/ML Solostar Pen, Inject 8 Units into the skin at bedtime. Titrate by 2 units every 3 days for fasting morning blood sugar >150., Disp: , Rfl:    Insulin Pen Needle (PEN NEEDLES 31GX5/16") 31G X 8 MM MISC, 1 each by Does not apply route daily., Disp: 100 each, Rfl: 11   latanoprost (XALATAN) 0.005 % ophthalmic solution, 1 drop at bedtime. (Patient not taking: Reported on 02/23/2024), Disp: , Rfl:    levothyroxine (SYNTHROID) 88 MCG tablet, Take 1 tablet (88 mcg total) by mouth daily., Disp: 90 tablet, Rfl: 1   lidocaine-prilocaine (EMLA) cream, Apply to affected area once, Disp: 30 g, Rfl: 3   loperamide (IMODIUM) 2 MG capsule, Take 2 tabs by mouth with first loose stool, then 1 tab with each additional loose stool as needed. Do not exceed 8 tabs in a 24-hour period, Disp: 60 capsule, Rfl:  3   metoprolol (TOPROL-XL) 50 MG 24 hr tablet, Take 1 tablet (50 mg total) by mouth daily., Disp: 30 tablet, Rfl: 2   nicotine (NICODERM CQ - DOSED IN MG/24 HOURS) 21 mg/24hr patch, Place  1 patch (21 mg total) onto the skin daily., Disp: 28 patch, Rfl: 0   ondansetron (ZOFRAN) 8 MG tablet, Take 1 tablet (8 mg total) by mouth every 8 (eight) hours as needed for nausea or vomiting. Start on the third day after chemotherapy, Disp: 30 tablet, Rfl: 1   oxyCODONE (OXY IR/ROXICODONE) 5 MG immediate release tablet, Take 1 tablet (5 mg total) by mouth every 4 (four) hours as needed for severe pain (pain score 7-10)., Disp: 60 tablet, Rfl: 0   potassium chloride (KLOR-CON M10) 10 MEQ tablet, Take 1 tablet (10 mEq total) by mouth 2 (two) times daily., Disp: , Rfl:    prochlorperazine (COMPAZINE) 10 MG tablet, Take 1 tablet (10 mg total) by mouth every 6 (six) hours as needed for nausea or vomiting (Nausea or vomiting)., Disp: 30 tablet, Rfl: 1   VYZULTA 0.024 % SOLN, Place 1 drop into both eyes at bedtime., Disp: , Rfl:   Physical exam:  Vitals:   03/26/24 0848  BP: (!) 146/65  Pulse: 77  Resp: 18  Temp: 99 F (37.2 C)  TempSrc: Tympanic  SpO2: 100%  Weight: 156 lb 9.6 oz (71 kg)  Height: 5\' 3"  (1.6 m)   Physical Exam Eyes:     Pupils: Pupils are equal, round, and reactive to light.  Cardiovascular:     Rate and Rhythm: Normal rate and regular rhythm.     Heart sounds: Normal heart sounds.  Pulmonary:     Effort: Pulmonary effort is normal.     Breath sounds: Normal breath sounds.  Abdominal:     Comments: Distended ascites positive  Musculoskeletal:     Cervical back: Normal range of motion.     Right lower leg: Edema present.     Left lower leg: Edema present.  Skin:    General: Skin is warm and dry.  Neurological:     Mental Status: She is alert and oriented to person, place, and time.      I have personally reviewed labs listed below:    Latest Ref Rng & Units 03/26/2024    8:25  AM  CMP  Glucose 70 - 99 mg/dL 161   BUN 8 - 23 mg/dL 6   Creatinine 0.96 - 0.45 mg/dL 4.09   Sodium 811 - 914 mmol/L 127   Potassium 3.5 - 5.1 mmol/L 3.7   Chloride 98 - 111 mmol/L 98   CO2 22 - 32 mmol/L 24   Calcium 8.9 - 10.3 mg/dL 7.1   Total Protein 6.5 - 8.1 g/dL 5.6   Total Bilirubin 0.0 - 1.2 mg/dL 0.5   Alkaline Phos 38 - 126 U/L 172   AST 15 - 41 U/L 35   ALT 0 - 44 U/L 14       Latest Ref Rng & Units 03/26/2024    8:25 AM  CBC  WBC 4.0 - 10.5 K/uL 8.2   Hemoglobin 12.0 - 15.0 g/dL 7.6   Hematocrit 78.2 - 46.0 % 23.1   Platelets 150 - 400 K/uL 323    I have personally reviewed Radiology images listed below: No images are attached to the encounter.  ECHOCARDIOGRAM COMPLETE Result Date: 03/20/2024    ECHOCARDIOGRAM REPORT   Patient Name:   MISHAEL KRYSIAK Edelson Date of Exam: 03/20/2024 Medical Rec #:  [id number]         Height:       63.0 in Accession #:    [id number]        Weight:  163.6 lb Date of Birth:  12-14-1951          BSA:          1.775 m Patient Age:    72 years          BP:           143/81 mmHg Patient Gender: F                 HR:           85 bpm. Exam Location:  [facility] Procedure: 2D Echo, 3D Echo, Cardiac Doppler, Color Doppler and Strain Analysis            (Both Spectral and Color Flow Doppler were utilized during            procedure). Indications:     Edema due to malnutrition, due to unspecified malnutrition type                  E43                  Congestive heart failure I50.9  History:         Patient has prior history of Echocardiogram examinations, most                  recent 10/09/2015. Risk Factors:Diabetes and Hypertension.  Sonographer:     Broadus Canes Referring Phys:  [id number] JOSHUA R BORDERS Diagnosing Phys: Constancia Delton MD  Sonographer Comments: Global longitudinal strain was attempted. IMPRESSIONS  1. Left ventricular ejection fraction, by estimation, is 55 to 60%. The left ventricle has normal function. The left ventricle has no regional wall  motion abnormalities. There is mild left ventricular hypertrophy. Left ventricular diastolic parameters are consistent with Grade I diastolic dysfunction (impaired relaxation).  2. Right ventricular systolic function is normal. The right ventricular size is normal.  3. The mitral valve is degenerative. Trivial mitral valve regurgitation. The mean mitral valve gradient is 4.0 mmHg.  4. The aortic valve is grossly normal. Aortic valve regurgitation is not visualized.  5. The inferior vena cava is normal in size with greater than 50% respiratory variability, suggesting right atrial pressure of 3 mmHg. FINDINGS  Left Ventricle: Left ventricular ejection fraction, by estimation, is 55 to 60%. The left ventricle has normal function. The left ventricle has no regional wall motion abnormalities. The global longitudinal strain is normal despite suboptimal segment tracking. The left ventricular internal cavity size was normal in size. There is mild left ventricular hypertrophy. Left ventricular diastolic parameters are consistent with Grade I diastolic dysfunction (impaired relaxation). Right Ventricle: The right ventricular size is normal. No increase in right ventricular wall thickness. Right ventricular systolic function is normal. Left Atrium: Left atrial size was normal in size. Right Atrium: Right atrial size was normal in size. Pericardium: There is no evidence of pericardial effusion. Mitral Valve: The mitral valve is degenerative in appearance. Trivial mitral valve regurgitation. MV peak gradient, 8.8 mmHg. The mean mitral valve gradient is 4.0 mmHg. Tricuspid Valve: The tricuspid valve is normal in structure. Tricuspid valve regurgitation is not demonstrated. Aortic Valve: The aortic valve is grossly normal. Aortic valve regurgitation is not visualized. Aortic valve mean gradient measures 3.0 mmHg. Aortic valve peak gradient measures 6.7 mmHg. Aortic valve area, by VTI measures 3.25 cm. Pulmonic Valve: The pulmonic  valve was normal in structure. Pulmonic valve regurgitation is not visualized. Aorta: The aortic root is normal in size and structure. Venous: The inferior vena cava  is normal in size with greater than 50% respiratory variability, suggesting right atrial pressure of 3 mmHg. IAS/Shunts: No atrial level shunt detected by color flow Doppler. Additional Comments: 3D was performed not requiring image post processing on an independent workstation and was indeterminate.  LEFT VENTRICLE PLAX 2D LVIDd:         4.00 cm   Diastology LVIDs:         2.40 cm   LV e' medial:    6.53 cm/s LV PW:         1.10 cm   LV E/e' medial:  13.9 LV IVS:        1.30 cm   LV e' lateral:   8.92 cm/s LVOT diam:     2.00 cm   LV E/e' lateral: 10.2 LV SV:         75 LV SV Index:   42        2D Longitudinal Strain LVOT Area:     3.14 cm  2D Strain GLS Avg:     -20.2 %                           3D Volume EF:                          3D EF:        57 %                          LV EDV:       130 ml                          LV ESV:       56 ml                          LV SV:        74 ml RIGHT VENTRICLE RV Basal diam:  2.20 cm RV Mid diam:    1.80 cm RV S prime:     17.20 cm/s TAPSE (M-mode): 3.7 cm LEFT ATRIUM             Index        RIGHT ATRIUM           Index LA diam:        3.30 cm 1.86 cm/m   RA Area:     13.30 cm LA Vol (A2C):   29.7 ml 16.73 ml/m  RA Volume:   28.70 ml  16.17 ml/m LA Vol (A4C):   38.8 ml 21.85 ml/m LA Biplane Vol: 36.9 ml 20.78 ml/m  AORTIC VALVE AV Area (Vmax):    2.73 cm AV Area (Vmean):   2.76 cm AV Area (VTI):     3.25 cm AV Vmax:           129.00 cm/s AV Vmean:          82.400 cm/s AV VTI:            0.231 m AV Peak Grad:      6.7 mmHg AV Mean Grad:      3.0 mmHg LVOT Vmax:         112.00 cm/s LVOT Vmean:        72.300 cm/s LVOT VTI:          0.239  m LVOT/AV VTI ratio: 1.03  AORTA Ao Root diam: 2.70 cm MITRAL VALVE                TRICUSPID VALVE MV Area (PHT): 3.95 cm     TR Peak grad:   22.8 mmHg MV Area VTI:    2.27 cm     TR Vmax:        239.00 cm/s MV Peak grad:  8.8 mmHg MV Mean grad:  4.0 mmHg     SHUNTS MV Vmax:       1.48 m/s     Systemic VTI:  0.24 m MV Vmean:      96.8 cm/s    Systemic Diam: 2.00 cm MV Decel Time: 192 msec MV E velocity: 90.70 cm/s MV A velocity: 148.00 cm/s MV E/A ratio:  0.61 Constancia Delton MD Electronically signed by Constancia Delton MD Signature Date/Time: 03/20/2024/12:18:53 PM    Final    DG Chest 2 View Result Date: 03/08/2024 CLINICAL DATA:  Fever.  Sepsis. EXAM: CHEST - 2 VIEW COMPARISON:  Yesterday FINDINGS: Apical lordotic frontal radiograph. AP lateral views. Right Port-A-Cath tip superior caval/atrial junction. Midline trachea. Borderline cardiomegaly. Atherosclerosis in the transverse aorta. Tiny bilateral pleural effusions are similar. No pneumothorax. No congestive failure. Similar subsegmental atelectasis at both lung bases. IMPRESSION: No significant change since one day prior. Tiny bilateral pleural effusions with subsegmental atelectasis at both lung bases. Aortic Atherosclerosis (ICD10-I70.0). Electronically Signed   By: Lore Rode M.D.   On: 03/08/2024 18:32   DG Chest 2 View Result Date: 03/07/2024 CLINICAL DATA:  Cough, fever. EXAM: CHEST - 2 VIEW COMPARISON:  None Available. FINDINGS: The heart size and mediastinal contours are within normal limits. Right internal jugular Port-A-Cath is in grossly good position. Minimal bibasilar subsegmental atelectasis is noted with small pleural effusions. The visualized skeletal structures are unremarkable. IMPRESSION: Minimal bibasilar subsegmental atelectasis is noted with small pleural effusions. Electronically Signed   By: Rosalene Colon M.D.   On: 03/07/2024 16:42     Assessment and plan- Patient is a 73 y.o. female with metastatic pancreatic cancer with liver metastases here for on treatment assessment prior to cycle 2 of modified FOLFIRINOX chemotherapy  Patient's performance status even prior to starting  chemotherapy was 2 and she tolerated modified FOLFIRINOX chemotherapy poorly.  She has severe hypoalbuminemia causing bilateral lower extremity edema as well as ascites.  I have asked her to continue daily Lasix but unless her albumin levels improve it is unlikely that her edema will improve significantly.  Albumin will only improve if her underlying malignancy gets better and her overall oral intake gets better.  Given that she could not tolerate modified FOLFIRINOX well I will switch her to gemcitabine Abraxane chemotherapy week on week off 2 weeks from now.  I will reduce the dose of gemcitabine to 800 mg/m.  Discussed this and benefits of chemotherapy including all but not limited to nausea vomiting low blood counts risk of infections and hospitalizations.  Treatment will be given with a palliative intent.  Patient understands and agrees to proceed as planned  Her hemoglobin has decreased to 7.6 and we will plan for 1 unit of PRBC transfusion this week.   Visit Diagnosis 1. Malignant neoplasm of other parts of pancreas (HCC)   2. Symptomatic anemia   3. Hypoalbuminemia      Dr. Seretha Dance, MD, MPH [facility] [phone] 03/31/2024 7:06 PM

## 2024-04-01 ENCOUNTER — Other Ambulatory Visit: Payer: Self-pay | Admitting: Oncology

## 2024-04-01 NOTE — Progress Notes (Signed)
 Pharmacist Chemotherapy Monitoring - Initial Assessment    Anticipated start date: 04/02/2024   The following has been reviewed per standard work regarding the patient's treatment regimen The patient's diagnosis, treatment plan and drug doses, and organ/hematologic function Lab orders and baseline tests specific to treatment regimen  The treatment plan start date, drug sequencing, and pre-medications Prior authorization status  Patient's documented medication list, including drug-drug interaction screen and prescriptions for anti-emetics and supportive care specific to the treatment regimen The drug concentrations, fluid compatibility, administration routes, and timing of the medications to be used The patient's access for treatment and lifetime cumulative dose history, if applicable  The patient's medication allergies and previous infusion related reactions, if applicable   Changes made to treatment plan:   Follow up needed:  Patient unable to handle modified folfirinox requiring hospitalization.  Regimen channge to abraxane/gemzar D1,8    Heather Burke, RPH, 04/01/2024  3:24 PM

## 2024-04-02 ENCOUNTER — Encounter: Payer: Self-pay | Admitting: Oncology

## 2024-04-02 DIAGNOSIS — D649 Anemia, unspecified: Secondary | ICD-10-CM

## 2024-04-02 DIAGNOSIS — C259 Malignant neoplasm of pancreas, unspecified: Secondary | ICD-10-CM

## 2024-04-03 ENCOUNTER — Encounter: Payer: Self-pay | Admitting: Oncology

## 2024-04-04 ENCOUNTER — Encounter: Payer: Self-pay | Admitting: Oncology

## 2024-04-04 NOTE — Progress Notes (Signed)
 Patient has decided not to continue with chemo per last phone message 4/15

## 2024-04-05 ENCOUNTER — Inpatient Hospital Stay

## 2024-04-05 ENCOUNTER — Encounter: Payer: Self-pay | Admitting: Oncology

## 2024-04-05 ENCOUNTER — Inpatient Hospital Stay: Admitting: Oncology

## 2024-04-05 NOTE — Progress Notes (Signed)
 Check on patient  wishes. Last note 4/17 patient did not want to more treatment

## 2024-04-06 ENCOUNTER — Other Ambulatory Visit: Payer: Self-pay | Admitting: Hospice and Palliative Medicine

## 2024-04-08 ENCOUNTER — Other Ambulatory Visit: Payer: Self-pay | Admitting: Hospice and Palliative Medicine

## 2024-04-08 ENCOUNTER — Encounter: Payer: Self-pay | Admitting: Oncology

## 2024-04-08 DIAGNOSIS — E876 Hypokalemia: Secondary | ICD-10-CM

## 2024-04-08 MED ORDER — FUROSEMIDE 20 MG PO TABS: 20.0000 mg | ORAL_TABLET | Freq: Two times a day (BID) | ORAL | 2 refills | Status: DC

## 2024-04-08 MED ORDER — POTASSIUM CHLORIDE CRYS ER 10 MEQ PO TBCR: 10.0000 meq | EXTENDED_RELEASE_TABLET | Freq: Two times a day (BID) | ORAL | 1 refills | Status: DC

## 2024-04-08 NOTE — Progress Notes (Signed)
 I spoke with patient's daughter, Alexandria Ida.  She confirms that patient has decided to forego chemotherapy.  Discussed the option of hospice involvement at home versus continued office follow-up.  She plans to speak with patient and family and will let us  know what they decide.   She did request refill of furosemide  and potassium.

## 2024-04-09 ENCOUNTER — Ambulatory Visit: Admitting: Oncology

## 2024-04-09 ENCOUNTER — Other Ambulatory Visit: Payer: Self-pay

## 2024-04-09 ENCOUNTER — Ambulatory Visit

## 2024-04-09 ENCOUNTER — Other Ambulatory Visit

## 2024-04-09 ENCOUNTER — Inpatient Hospital Stay

## 2024-04-09 NOTE — Telephone Encounter (Signed)
 Dr. Randy Buttery would like patient to be seen in [facility]  Rudi Corwin, NP would like patient to be scheduled for lab/SM[facility]/poss IVF.  Scheduling will contact patient.

## 2024-04-09 NOTE — Addendum Note (Signed)
 Encounter addended by: Karolynn Pack on: 04/09/2024 11:13 AM  Actions taken: Imaging Exam ended

## 2024-04-09 NOTE — Addendum Note (Signed)
 Encounter addended by: Karolynn Pack on: 04/09/2024 8:16 AM  Actions taken: Imaging Exam ended

## 2024-04-10 ENCOUNTER — Encounter: Payer: Self-pay | Admitting: Oncology

## 2024-04-11 ENCOUNTER — Inpatient Hospital Stay (HOSPITAL_BASED_OUTPATIENT_CLINIC_OR_DEPARTMENT_OTHER): Admitting: Hospice and Palliative Medicine

## 2024-04-11 ENCOUNTER — Encounter

## 2024-04-11 ENCOUNTER — Inpatient Hospital Stay

## 2024-04-11 ENCOUNTER — Encounter: Payer: Self-pay | Admitting: Hospice and Palliative Medicine

## 2024-04-11 VITALS — BP 121/62 | HR 88 | Temp 98.0°F | Resp 16 | Wt 137.1 lb

## 2024-04-11 DIAGNOSIS — D649 Anemia, unspecified: Secondary | ICD-10-CM

## 2024-04-11 DIAGNOSIS — Z5111 Encounter for antineoplastic chemotherapy: Secondary | ICD-10-CM | POA: Diagnosis not present

## 2024-04-11 DIAGNOSIS — C787 Secondary malignant neoplasm of liver and intrahepatic bile duct: Secondary | ICD-10-CM

## 2024-04-11 DIAGNOSIS — C259 Malignant neoplasm of pancreas, unspecified: Secondary | ICD-10-CM

## 2024-04-11 LAB — CBC WITH DIFFERENTIAL (CANCER CENTER ONLY)
Abs Immature Granulocytes: 0.3 10*3/uL — ABNORMAL HIGH (ref 0.00–0.07)
Basophils Absolute: 0.1 10*3/uL (ref 0.0–0.1)
Basophils Relative: 1 %
Eosinophils Absolute: 0.3 10*3/uL (ref 0.0–0.5)
Eosinophils Relative: 1 %
HCT: 29.5 % — ABNORMAL LOW (ref 36.0–46.0)
Hemoglobin: 9.7 g/dL — ABNORMAL LOW (ref 12.0–15.0)
Immature Granulocytes: 1 %
Lymphocytes Relative: 11 %
Lymphs Abs: 2.5 10*3/uL (ref 0.7–4.0)
MCH: 28.4 pg (ref 26.0–34.0)
MCHC: 32.9 g/dL (ref 30.0–36.0)
MCV: 86.3 fL (ref 80.0–100.0)
Monocytes Absolute: 1.7 10*3/uL — ABNORMAL HIGH (ref 0.1–1.0)
Monocytes Relative: 8 %
Neutro Abs: 17.4 10*3/uL — ABNORMAL HIGH (ref 1.7–7.7)
Neutrophils Relative %: 78 %
Platelet Count: 225 10*3/uL (ref 150–400)
RBC: 3.42 MIL/uL — ABNORMAL LOW (ref 3.87–5.11)
RDW: 16.5 % — ABNORMAL HIGH (ref 11.5–15.5)
WBC Count: 22.3 10*3/uL — ABNORMAL HIGH (ref 4.0–10.5)
nRBC: 0 % (ref 0.0–0.2)

## 2024-04-11 LAB — CMP (CANCER CENTER ONLY)
ALT: 10 U/L (ref 0–44)
AST: 31 U/L (ref 15–41)
Albumin: <1.5 g/dL — ABNORMAL LOW (ref 3.5–5.0)
Alkaline Phosphatase: 175 U/L — ABNORMAL HIGH (ref 38–126)
Anion gap: 8 (ref 5–15)
BUN: 12 mg/dL (ref 8–23)
CO2: 21 mmol/L — ABNORMAL LOW (ref 22–32)
Calcium: 7.7 mg/dL — ABNORMAL LOW (ref 8.9–10.3)
Chloride: 99 mmol/L (ref 98–111)
Creatinine: 0.61 mg/dL (ref 0.44–1.00)
GFR, Estimated: >60 mL/min (ref >60)
Glucose, Bld: 151 mg/dL — ABNORMAL HIGH (ref 70–99)
Potassium: 3.5 mmol/L (ref 3.5–5.1)
Sodium: 128 mmol/L — ABNORMAL LOW (ref 135–145)
Total Bilirubin: 0.7 mg/dL (ref 0.0–1.2)
Total Protein: 6.3 g/dL — ABNORMAL LOW (ref 6.5–8.1)

## 2024-04-11 LAB — SAMPLE TO BLOOD BANK

## 2024-04-11 MED ORDER — DEXAMETHASONE 2 MG PO TABS: 2.0000 mg | ORAL_TABLET | Freq: Every day | ORAL | 1 refills | Status: DC

## 2024-04-11 MED ORDER — HEPARIN SOD (PORK) LOCK FLUSH 100 UNIT/ML IV SOLN
500.0000 [IU] | Freq: Once | INTRAVENOUS | Status: AC
  Administered 2024-04-11: 500 [IU] via INTRAVENOUS
  Filled 2024-04-11: qty 5

## 2024-04-11 NOTE — Progress Notes (Signed)
 Pt. Port heparin  locked and de accessed no treatment needed today.

## 2024-04-11 NOTE — Progress Notes (Signed)
 Symptom Management Clinic [facility] [phone] [phone]  Patient Care Team: Tasia Farr, FNP as PCP - General (Family Medicine) Bary Boss., MD as Consulting Physician (Ophthalmology) Alla Isaacs, Severa Daniels, RPH-CPP as Pharmacist Rochell Chroman, RN as Oncology Nurse Navigator Avonne Boettcher, MD as Consulting Physician (Oncology)   NAME OF PATIENT: Heather Burke  [id number]  11-04-1951   DATE OF VISIT: 04/11/24  REASON FOR CONSULT: Heather Burke is a 73 y.o. female with multiple medical problems including stage IV pancreatic cancer with liver metastases.  INTERVAL HISTORY: On 03/05/2024 she received day 1, cycle 1 modified FOLFIRINOX chemotherapy.    She was subsequently hospitalized 03/08/2024 to 03/16/2024 with SIRS.  No clear source of infection was identified.  Patient was treated empirically with antibiotics.   Patient presents [facility] today for labs and supportive care.  Overall, patient declining.  She endorses progressive weakness, now having difficulty standing.  Oral intake is minimal.  Denies any neurologic complaints.  Denies GI or GU symptoms.  Denies any easy bleeding or bruising.  No shortness of breath or chest pain.  Patient offers no further specific complaints today.   PAST MEDICAL HISTORY: Past Medical History:  Diagnosis Date   Breast cancer (HCC) 06/08/2015   T1c, N0;  ER+,PR+, Her 2 neg (FISH neg) x2., SLN x 3 negative. Mammoprint: Low risk. Multifocal.Invasive mammary and lobular carcinoma.    Diabetes mellitus without complication (HCC)    GERD (gastroesophageal reflux disease)    Heart murmur 2016   newly diagnosed, slight murmur   Hyperlipidemia    Hypertension    Hypothyroidism    Lupus    PONV (postoperative nausea and vomiting)    with Hysterectomy   Port-A-Cath in place    Thyroid  disease     PAST SURGICAL HISTORY:  Past Surgical History:  Procedure Laterality  Date   ABDOMINAL HYSTERECTOMY     BREAST BIOPSY Right 2002   negative/ Dr. Marquita Situ   BREAST BIOPSY Left 06-08-15   INVASIVE MAMMARY CARCINOMA WITH PARTIAL SOLID PATTERN.    BREAST BIOPSY Right 06/26/2018   FIBROADENOMATOUS CHANGES WITH STROMAL HYALINIZATION AND CALCIFICATIONS   COLONOSCOPY WITH PROPOFOL  N/A 09/13/2016   Procedure: COLONOSCOPY WITH PROPOFOL ;  Surgeon: Marnee Sink, MD;  Location: [facility] ENDOSCOPY;  Service: Endoscopy;  Laterality: N/A;   core biopsy Left 06/08/15   GUM SURGERY  2006   IR IMAGING GUIDED PORT INSERTION  02/28/2024   MASTECTOMY Left 2016   OOPHORECTOMY     SENTINEL NODE BIOPSY Left 08/18/2015   Procedure: SENTINEL NODE BIOPSY;  Surgeon: Marshall Skeeter, MD;  Location: [facility] ORS;  Service: General;  Laterality: Left;   SIMPLE MASTECTOMY WITH AXILLARY SENTINEL NODE BIOPSY Left 08/18/2015   Procedure: SIMPLE MASTECTOMY;  Surgeon: Marshall Skeeter, MD;  Location: [facility] ORS;  Service: General;  Laterality: Left;   TONSILLECTOMY      HEMATOLOGY/ONCOLOGY HISTORY:  Oncology History  Malignant neoplasm of other parts of pancreas (HCC)  02/23/2024 Initial Diagnosis   Malignant neoplasm of other parts of pancreas (HCC)   02/23/2024 Cancer Staging   Staging form: Exocrine Pancreas, AJCC 8th Edition - Clinical stage from 02/23/2024: Stage IV (cT2, cN2, pM1) - Signed by Avonne Boettcher, MD on 02/23/2024   03/05/2024 - 03/07/2024 Chemotherapy   Patient is on Treatment Plan : PANCREAS Modified FOLFIRINOX q14d x 4 cycles     04/05/2024 -  Chemotherapy   Patient is on Treatment Plan :  BREAST Abraxane D1,8 + Gemcitabine D1, 8 q21d       ALLERGIES:  is allergic to metformin  and related, amlodipine , atorvastatin, lisinopril, pravastatin, icosapent  ethyl (epa ethyl ester) (fish), and nifedipine  er.  MEDICATIONS:  Current Outpatient Medications  Medication Sig Dispense Refill   ACCU-CHEK GUIDE TEST test strip USE 1 STIP TO CHECK BLOOD SUGAR IN THE MORNING, AT NOON, AND AT BEDTIME 100  strip 0   Accu-Chek Softclix Lancets lancets 1 EACH BY DOES NOT APPLY ROUTE IN THE MORNING, AT NOON, AND AT BEDTIME 100 each 0   aspirin 81 MG tablet Take 81 mg by mouth daily.     Blood Glucose Monitoring Suppl DEVI 1 each by Does not apply route in the morning, at noon, and at bedtime. May substitute to any manufacturer covered by patient's insurance. 1 each 0   Cholecalciferol  125 MCG (5000 UT) TABS Take 1 tablet by mouth daily.     Continuous Glucose Sensor (FREESTYLE LIBRE 3 PLUS SENSOR) MISC Place 1 sensor on the skin every 15 days. Use to check glucose continuously 2 each 12   dexamethasone  (DECADRON ) 2 MG tablet Take 1 tablet (2 mg total) by mouth daily. 15 tablet 1   fenofibrate  (TRICOR ) 145 MG tablet Take 1 tablet (145 mg total) by mouth daily. 90 tablet 3   fluticasone  (FLONASE ) 50 MCG/ACT nasal spray SPRAY 2 SPRAYS INTO EACH NOSTRIL EVERY DAY 48 mL 2   folic acid  (FOLVITE ) 1 MG tablet Take 1 tablet (1 mg total) by mouth daily. 90 tablet 1   furosemide  (LASIX ) 20 MG tablet Take 1 tablet (20 mg total) by mouth 2 (two) times daily. 60 tablet 2   Insulin  Pen Needle (PEN NEEDLES 31GX5/16") 31G X 8 MM MISC 1 each by Does not apply route daily. 100 each 11   levothyroxine  (SYNTHROID ) 88 MCG tablet Take 1 tablet (88 mcg total) by mouth daily. 90 tablet 1   metoprolol  (TOPROL -XL) 50 MG 24 hr tablet Take 1 tablet (50 mg total) by mouth daily. 30 tablet 2   mirtazapine  (REMERON ) 15 MG tablet Take 15 mg by mouth at bedtime.     nicotine  (NICODERM CQ  - DOSED IN MG/24 HOURS) 21 mg/24hr patch Place 1 patch (21 mg total) onto the skin daily. 28 patch 0   oxyCODONE  (OXY IR/ROXICODONE ) 5 MG immediate release tablet Take 1 tablet (5 mg total) by mouth every 4 (four) hours as needed for severe pain (pain score 7-10). 60 tablet 0   potassium chloride  (KLOR-CON  M10) 10 MEQ tablet Take 1 tablet (10 mEq total) by mouth 2 (two) times daily. 60 tablet 1   VYZULTA  0.024 % SOLN Place 1 drop into both eyes at  bedtime.     latanoprost  (XALATAN ) 0.005 % ophthalmic solution 1 drop at bedtime. (Patient not taking: Reported on 02/23/2024)     No current facility-administered medications for this visit.    VITAL SIGNS: BP 121/62 (BP Location: Right Arm, Patient Position: Sitting)   Pulse 88   Temp 98 F (36.7 C) (Tympanic)   Resp 16   Wt 137 lb 1.6 oz (62.2 kg)   BMI 24.29 kg/m  Filed Weights   04/11/24 1050  Weight: 137 lb 1.6 oz (62.2 kg)    Estimated body mass index is 24.29 kg/m as calculated from the following:   Height as of 03/26/24: 5\' 3"  (1.6 m).   Weight as of this encounter: 137 lb 1.6 oz (62.2 kg).  LABS: CBC:    Component  Value Date/Time   WBC 22.3 (H) 04/11/2024 1040   WBC 2.6 (L) 03/16/2024 0430   HGB 9.7 (L) 04/11/2024 1040   HGB 12.3 02/09/2024 1300   HCT 29.5 (L) 04/11/2024 1040   HCT 37.6 02/09/2024 1300   PLT 225 04/11/2024 1040   PLT 445 02/09/2024 1300   MCV 86.3 04/11/2024 1040   MCV 92 02/09/2024 1300   NEUTROABS 17.4 (H) 04/11/2024 1040   NEUTROABS 6.7 02/09/2024 1300   LYMPHSABS 2.5 04/11/2024 1040   LYMPHSABS 1.1 02/09/2024 1300   MONOABS 1.7 (H) 04/11/2024 1040   EOSABS 0.3 04/11/2024 1040   EOSABS 0.1 02/09/2024 1300   BASOSABS 0.1 04/11/2024 1040   BASOSABS 0.0 02/09/2024 1300   Comprehensive Metabolic Panel:    Component Value Date/Time   NA 128 (L) 04/11/2024 1040   NA 128 (L) 08/09/2023 1032   K 3.5 04/11/2024 1040   CL 99 04/11/2024 1040   CO2 21 (L) 04/11/2024 1040   BUN 12 04/11/2024 1040   BUN 8 08/09/2023 1032   CREATININE 0.61 04/11/2024 1040   GLUCOSE 151 (H) 04/11/2024 1040   CALCIUM  7.7 (L) 04/11/2024 1040   AST 31 04/11/2024 1040   ALT 10 04/11/2024 1040   ALKPHOS 175 (H) 04/11/2024 1040   BILITOT 0.7 04/11/2024 1040   PROT 6.3 (L) 04/11/2024 1040   PROT 7.1 08/09/2023 1032   ALBUMIN <1.5 (L) 04/11/2024 1040   ALBUMIN 4.1 08/09/2023 1032    RADIOGRAPHIC STUDIES: ECHOCARDIOGRAM COMPLETE Result Date: 03/20/2024     ECHOCARDIOGRAM REPORT   Patient Name:   Aamira A Bayly Date of Exam: 03/20/2024 Medical Rec #:  [id number]         Height:       63.0 in Accession #:    [id number]        Weight:       163.6 lb Date of Birth:  Nov 03, 1951          BSA:          1.775 m Patient Age:    72 years          BP:           143/81 mmHg Patient Gender: F                 HR:           85 bpm. Exam Location:  [facility] Procedure: 2D Echo, 3D Echo, Cardiac Doppler, Color Doppler and Strain Analysis            (Both Spectral and Color Flow Doppler were utilized during            procedure). Indications:     Edema due to malnutrition, due to unspecified malnutrition type                  E43                  Congestive heart failure I50.9  History:         Patient has prior history of Echocardiogram examinations, most                  recent 10/09/2015. Risk Factors:Diabetes and Hypertension.  Sonographer:     Broadus Canes Referring Phys:  [id number] Jadon Harbaugh R Janki Dike Diagnosing Phys: Constancia Delton MD  Sonographer Comments: Global longitudinal strain was attempted. IMPRESSIONS  1. Left ventricular ejection fraction, by estimation, is 55 to 60%. The left ventricle has normal function. The left  ventricle has no regional wall motion abnormalities. There is mild left ventricular hypertrophy. Left ventricular diastolic parameters are consistent with Grade I diastolic dysfunction (impaired relaxation).  2. Right ventricular systolic function is normal. The right ventricular size is normal.  3. The mitral valve is degenerative. Trivial mitral valve regurgitation. The mean mitral valve gradient is 4.0 mmHg.  4. The aortic valve is grossly normal. Aortic valve regurgitation is not visualized.  5. The inferior vena cava is normal in size with greater than 50% respiratory variability, suggesting right atrial pressure of 3 mmHg. FINDINGS  Left Ventricle: Left ventricular ejection fraction, by estimation, is 55 to 60%. The left ventricle has normal function. The left  ventricle has no regional wall motion abnormalities. The global longitudinal strain is normal despite suboptimal segment tracking. The left ventricular internal cavity size was normal in size. There is mild left ventricular hypertrophy. Left ventricular diastolic parameters are consistent with Grade I diastolic dysfunction (impaired relaxation). Right Ventricle: The right ventricular size is normal. No increase in right ventricular wall thickness. Right ventricular systolic function is normal. Left Atrium: Left atrial size was normal in size. Right Atrium: Right atrial size was normal in size. Pericardium: There is no evidence of pericardial effusion. Mitral Valve: The mitral valve is degenerative in appearance. Trivial mitral valve regurgitation. MV peak gradient, 8.8 mmHg. The mean mitral valve gradient is 4.0 mmHg. Tricuspid Valve: The tricuspid valve is normal in structure. Tricuspid valve regurgitation is not demonstrated. Aortic Valve: The aortic valve is grossly normal. Aortic valve regurgitation is not visualized. Aortic valve mean gradient measures 3.0 mmHg. Aortic valve peak gradient measures 6.7 mmHg. Aortic valve area, by VTI measures 3.25 cm. Pulmonic Valve: The pulmonic valve was normal in structure. Pulmonic valve regurgitation is not visualized. Aorta: The aortic root is normal in size and structure. Venous: The inferior vena cava is normal in size with greater than 50% respiratory variability, suggesting right atrial pressure of 3 mmHg. IAS/Shunts: No atrial level shunt detected by color flow Doppler. Additional Comments: 3D was performed not requiring image post processing on an independent workstation and was indeterminate.  LEFT VENTRICLE PLAX 2D LVIDd:         4.00 cm   Diastology LVIDs:         2.40 cm   LV e' medial:    6.53 cm/s LV PW:         1.10 cm   LV E/e' medial:  13.9 LV IVS:        1.30 cm   LV e' lateral:   8.92 cm/s LVOT diam:     2.00 cm   LV E/e' lateral: 10.2 LV SV:         75  LV SV Index:   42        2D Longitudinal Strain LVOT Area:     3.14 cm  2D Strain GLS Avg:     -20.2 %                           3D Volume EF:                          3D EF:        57 %                          LV EDV:  130 ml                          LV ESV:       56 ml                          LV SV:        74 ml RIGHT VENTRICLE RV Basal diam:  2.20 cm RV Mid diam:    1.80 cm RV S prime:     17.20 cm/s TAPSE (M-mode): 3.7 cm LEFT ATRIUM             Index        RIGHT ATRIUM           Index LA diam:        3.30 cm 1.86 cm/m   RA Area:     13.30 cm LA Vol (A2C):   29.7 ml 16.73 ml/m  RA Volume:   28.70 ml  16.17 ml/m LA Vol (A4C):   38.8 ml 21.85 ml/m LA Biplane Vol: 36.9 ml 20.78 ml/m  AORTIC VALVE AV Area (Vmax):    2.73 cm AV Area (Vmean):   2.76 cm AV Area (VTI):     3.25 cm AV Vmax:           129.00 cm/s AV Vmean:          82.400 cm/s AV VTI:            0.231 m AV Peak Grad:      6.7 mmHg AV Mean Grad:      3.0 mmHg LVOT Vmax:         112.00 cm/s LVOT Vmean:        72.300 cm/s LVOT VTI:          0.239 m LVOT/AV VTI ratio: 1.03  AORTA Ao Root diam: 2.70 cm MITRAL VALVE                TRICUSPID VALVE MV Area (PHT): 3.95 cm     TR Peak grad:   22.8 mmHg MV Area VTI:   2.27 cm     TR Vmax:        239.00 cm/s MV Peak grad:  8.8 mmHg MV Mean grad:  4.0 mmHg     SHUNTS MV Vmax:       1.48 m/s     Systemic VTI:  0.24 m MV Vmean:      96.8 cm/s    Systemic Diam: 2.00 cm MV Decel Time: 192 msec MV E velocity: 90.70 cm/s MV A velocity: 148.00 cm/s MV E/A ratio:  0.61 Constancia Delton MD Electronically signed by Constancia Delton MD Signature Date/Time: 03/20/2024/12:18:53 PM    Final     PERFORMANCE STATUS (ECOG) : 3  Review of Systems Unless otherwise noted, a complete review of systems is negative.  Physical Exam General: Frail appearing Cardiovascular: regular rate and rhythm Pulmonary: clear anterior/posterior fields Abdomen: Organomegaly, distended, nontender, + bowel sounds GU: no  suprapubic tenderness Extremities: no edema, no joint deformities Skin: no rashes Neurological: Weakness but otherwise nonfocal  IMPRESSION/PLAN: Stage IV pancreatic cancer -status post cycle 1 modified FOLFIRINOX.  Patient was subsequently hospitalized and has been declining over the past month.  Now with poor performance status and likely limited viable treatment options.  Had a conversation with patient today regarding her goals.  Patient states that she is not interested in additional chemotherapy.  She feels that clinic visits have become increasingly physically taxing and patient primarily just wants to stay home.  She is in agreement with hospice involvement and to focus on comfort and quality of life.  Also discussed CODE STATUS with the patient stated that she was not interested in resuscitation or aggressive measures at end-of-life.  DNR order signed for patient to take home.  Symptomatically, patient most concerned about fatigue and poor oral intake.  She recently started mirtazapine  but has had no significant improvement.  Will trial low-dose dexamethasone .   Patient expressed understanding and was in agreement with this plan. She also understands that She can call clinic at any time with any questions, concerns, or complaints.   Thank you for allowing me to participate in the care of this very pleasant patient.   Time Total: 30 minutes  Visit consisted of counseling and education dealing with the complex and emotionally intense issues of symptom management in the setting of serious illness.Greater than 50%  of this time was spent counseling and coordinating care related to the above assessment and plan.  Signed by: Gerilyn Kobus, PhD, NP-C

## 2024-04-11 NOTE — Progress Notes (Signed)
 Concerns of increasing weakness with difficulty getting out of bed and loss of appetite with weight loss.  Noticed a new know on upper abdomen.  Started taking Mirtazapine  2 days ago.

## 2024-04-16 ENCOUNTER — Telehealth: Payer: Self-pay

## 2024-04-16 NOTE — Telephone Encounter (Signed)
 Copied from CRM [id number]. Topic: Referral - Question >> Apr 16, 2024  3:04 PM Lotus Round B wrote: Reason for CRM: sherri from [facility] called in because they received a referral about hospice care but the family would like the patient to continue treatment so they was wondering if they can switch to Palliative care instead . If Dr.Clifton or nurse has any questions she said they are more than welcome to give her a call .   Direct Line [phone] Sherri

## 2024-04-17 NOTE — Telephone Encounter (Signed)
 I do not know this patient personally and her PCP is currently on leave.  It looks like this referral was placed by Gerilyn Kobus, so I will forward this question to him as it seems he saw her recently and placed the order for hospice.

## 2024-04-18 ENCOUNTER — Telehealth: Payer: Self-pay | Admitting: Hospice and Palliative Medicine

## 2024-04-18 NOTE — Telephone Encounter (Signed)
 Received a message that patient was not started on hospice as previously referred.  I called and spoke with daughter, Alexandria Ida.  Reportedly, patient is rapidly declining.  Minimal oral intake.  Significantly weak.  We had a long discussion regarding patient's end-of-life care.  Daughter confirms goal is to keep patient home and comfortable.    Apparently, patient sought alternative treatments when she discontinued chemotherapy such as ivermectin and IV vitamins.  As a result of this, hospice declined referral.  Family were paying out-of-pocket for the services and it should not impact hospice eligibility.  Daughter verbalized understanding that patient unlikely to have any significant improvement from alternative treatments.  Daughter confirms that family would like to move forward with hospice care.

## 2024-04-19 ENCOUNTER — Ambulatory Visit

## 2024-04-19 ENCOUNTER — Other Ambulatory Visit

## 2024-04-19 ENCOUNTER — Ambulatory Visit: Admitting: Oncology

## 2024-04-24 ENCOUNTER — Telehealth: Payer: Self-pay | Admitting: Pharmacist

## 2024-04-24 ENCOUNTER — Other Ambulatory Visit: Payer: PPO | Admitting: Pharmacist

## 2024-04-24 NOTE — Progress Notes (Signed)
   04/24/2024  Patient ID: Heather Burke, female   DOB: Jan 04, 1951, 73 y.o.   MRN: [id number]   Attempt to reach patient by telephone today. Reach patient's daughter Alexandria Ida (listed as DPR in chart) who advises patient is unable to come to the phone and that patient does not need to reschedule this call. States patient is now established with hospice care.  Will send message to PCP to provide this update.  Arthur Lash, PharmD, [facility] [phone]

## 2024-05-19 DEATH — deceased
